# Patient Record
Sex: Female | Born: 1945
Health system: Southern US, Community
[De-identification: ages and names within clinical notes are randomized; demographics above are authoritative.]

## PROBLEM LIST (undated history)

## (undated) DIAGNOSIS — R112 Nausea with vomiting, unspecified: Secondary | ICD-10-CM

## (undated) DIAGNOSIS — D219 Benign neoplasm of connective and other soft tissue, unspecified: Secondary | ICD-10-CM

## (undated) DIAGNOSIS — T4145XA Adverse effect of unspecified anesthetic, initial encounter: Secondary | ICD-10-CM

## (undated) DIAGNOSIS — I10 Essential (primary) hypertension: Secondary | ICD-10-CM

## (undated) DIAGNOSIS — H548 Legal blindness, as defined in USA: Secondary | ICD-10-CM

## (undated) DIAGNOSIS — Z800 Family history of malignant neoplasm of digestive organs: Secondary | ICD-10-CM

## (undated) DIAGNOSIS — N939 Abnormal uterine and vaginal bleeding, unspecified: Secondary | ICD-10-CM

## (undated) DIAGNOSIS — D689 Coagulation defect, unspecified: Secondary | ICD-10-CM

## (undated) DIAGNOSIS — C541 Malignant neoplasm of endometrium: Secondary | ICD-10-CM

## (undated) DIAGNOSIS — Z801 Family history of malignant neoplasm of trachea, bronchus and lung: Secondary | ICD-10-CM

## (undated) DIAGNOSIS — D649 Anemia, unspecified: Secondary | ICD-10-CM

## (undated) DIAGNOSIS — G479 Sleep disorder, unspecified: Secondary | ICD-10-CM

## (undated) DIAGNOSIS — T8859XA Other complications of anesthesia, initial encounter: Secondary | ICD-10-CM

## (undated) DIAGNOSIS — Z9889 Other specified postprocedural states: Secondary | ICD-10-CM

## (undated) DIAGNOSIS — H409 Unspecified glaucoma: Secondary | ICD-10-CM

## (undated) DIAGNOSIS — F419 Anxiety disorder, unspecified: Secondary | ICD-10-CM

## (undated) DIAGNOSIS — J189 Pneumonia, unspecified organism: Secondary | ICD-10-CM

## (undated) DIAGNOSIS — T7840XA Allergy, unspecified, initial encounter: Secondary | ICD-10-CM

## (undated) DIAGNOSIS — Z9289 Personal history of other medical treatment: Secondary | ICD-10-CM

## (undated) DIAGNOSIS — H269 Unspecified cataract: Secondary | ICD-10-CM

## (undated) HISTORY — DX: Unspecified cataract: H26.9

## (undated) HISTORY — PX: EYE SURGERY: SHX253

## (undated) HISTORY — DX: Family history of malignant neoplasm of digestive organs: Z80.0

## (undated) HISTORY — PX: DILATION AND CURETTAGE OF UTERUS: SHX78

## (undated) HISTORY — DX: Allergy, unspecified, initial encounter: T78.40XA

## (undated) HISTORY — DX: Family history of malignant neoplasm of trachea, bronchus and lung: Z80.1

## (undated) HISTORY — DX: Coagulation defect, unspecified: D68.9

---

## 2012-02-18 DIAGNOSIS — H2510 Age-related nuclear cataract, unspecified eye: Secondary | ICD-10-CM | POA: Insufficient documentation

## 2012-02-18 DIAGNOSIS — H401190 Primary open-angle glaucoma, unspecified eye, stage unspecified: Secondary | ICD-10-CM | POA: Insufficient documentation

## 2012-02-18 DIAGNOSIS — H409 Unspecified glaucoma: Secondary | ICD-10-CM | POA: Insufficient documentation

## 2012-04-04 ENCOUNTER — Telehealth (HOSPITAL_COMMUNITY): Payer: Self-pay | Admitting: *Deleted

## 2012-04-04 DIAGNOSIS — D500 Iron deficiency anemia secondary to blood loss (chronic): Secondary | ICD-10-CM | POA: Insufficient documentation

## 2012-04-04 NOTE — ED Notes (Signed)
[facility] said pt. is there for pre-op for surgery and pt. has high BP and heart rate.  Pt. does not have a PCP and  she needs pt. to come here for follow-up tomorrow. Pt.'s surgery (? trabeculectomy) is cancelled. I explained that we do not do primary care here. I said pt. has not been here before. I suggested she go to [facility] because they can do primary care there. I gave her the phone number and address. Vassie Moselle 04/04/2012

## 2013-04-11 ENCOUNTER — Inpatient Hospital Stay (HOSPITAL_COMMUNITY)
Admission: EM | Admit: 2013-04-11 | Discharge: 2013-04-13 | DRG: 812 | Disposition: A | Discharge status: Home / Self Care | Payer: Medicaid Other | Attending: Internal Medicine | Admitting: Internal Medicine

## 2013-04-11 ENCOUNTER — Inpatient Hospital Stay (HOSPITAL_COMMUNITY): Payer: Medicaid Other

## 2013-04-11 ENCOUNTER — Emergency Department (HOSPITAL_COMMUNITY): Payer: Medicaid Other

## 2013-04-11 ENCOUNTER — Encounter (HOSPITAL_COMMUNITY): Payer: Self-pay | Admitting: Emergency Medicine

## 2013-04-11 DIAGNOSIS — D500 Iron deficiency anemia secondary to blood loss (chronic): Principal | ICD-10-CM | POA: Diagnosis present

## 2013-04-11 DIAGNOSIS — F411 Generalized anxiety disorder: Secondary | ICD-10-CM | POA: Diagnosis present

## 2013-04-11 DIAGNOSIS — R0602 Shortness of breath: Secondary | ICD-10-CM

## 2013-04-11 DIAGNOSIS — H409 Unspecified glaucoma: Secondary | ICD-10-CM | POA: Diagnosis present

## 2013-04-11 DIAGNOSIS — N949 Unspecified condition associated with female genital organs and menstrual cycle: Secondary | ICD-10-CM | POA: Diagnosis present

## 2013-04-11 DIAGNOSIS — D259 Leiomyoma of uterus, unspecified: Secondary | ICD-10-CM | POA: Diagnosis present

## 2013-04-11 DIAGNOSIS — E876 Hypokalemia: Secondary | ICD-10-CM | POA: Diagnosis present

## 2013-04-11 DIAGNOSIS — N920 Excessive and frequent menstruation with regular cycle: Secondary | ICD-10-CM | POA: Diagnosis present

## 2013-04-11 DIAGNOSIS — D509 Iron deficiency anemia, unspecified: Secondary | ICD-10-CM | POA: Diagnosis present

## 2013-04-11 DIAGNOSIS — T502X5A Adverse effect of carbonic-anhydrase inhibitors, benzothiadiazides and other diuretics, initial encounter: Secondary | ICD-10-CM | POA: Diagnosis present

## 2013-04-11 DIAGNOSIS — N938 Other specified abnormal uterine and vaginal bleeding: Secondary | ICD-10-CM | POA: Diagnosis present

## 2013-04-11 DIAGNOSIS — H547 Unspecified visual loss: Secondary | ICD-10-CM | POA: Diagnosis present

## 2013-04-11 DIAGNOSIS — E611 Iron deficiency: Secondary | ICD-10-CM | POA: Diagnosis present

## 2013-04-11 HISTORY — DX: Pneumonia, unspecified organism: J18.9

## 2013-04-11 HISTORY — DX: Unspecified glaucoma: H40.9

## 2013-04-11 HISTORY — DX: Anxiety disorder, unspecified: F41.9

## 2013-04-11 LAB — CBC WITH DIFFERENTIAL/PLATELET
Basophils Absolute: 0 10*3/uL (ref 0.0–0.1)
Basophils Relative: 0 % (ref 0–1)
HCT: 13.5 % — ABNORMAL LOW (ref 36.0–46.0)
Hemoglobin: 3.4 g/dL — CL (ref 12.0–15.0)
Lymphocytes Relative: 15 % (ref 12–46)
Monocytes Relative: 6 % (ref 3–12)
Neutro Abs: 8.3 10*3/uL — ABNORMAL HIGH (ref 1.7–7.7)
WBC: 10.6 10*3/uL — ABNORMAL HIGH (ref 4.0–10.5)

## 2013-04-11 LAB — PRO B NATRIURETIC PEPTIDE: Pro B Natriuretic peptide (BNP): 2474 pg/mL — ABNORMAL HIGH (ref 0–125)

## 2013-04-11 LAB — CBC
HCT: 33.2 % — ABNORMAL LOW (ref 36.0–46.0)
HCT: 30.7 % — ABNORMAL LOW (ref 36.0–46.0)
Hemoglobin: 9.9 g/dL — ABNORMAL LOW (ref 12.0–15.0)
MCH: 23.9 pg — ABNORMAL LOW (ref 26.0–34.0)
MCH: 23.9 pg — ABNORMAL LOW (ref 26.0–34.0)
MCHC: 32.2 g/dL (ref 30.0–36.0)
MCV: 75.6 fL — ABNORMAL LOW (ref 78.0–100.0)
Platelets: 313 10*3/uL (ref 150–400)
RBC: 4.39 MIL/uL (ref 3.87–5.11)
RBC: 4.14 MIL/uL (ref 3.87–5.11)
WBC: 13.6 10*3/uL — ABNORMAL HIGH (ref 4.0–10.5)

## 2013-04-11 LAB — COMPREHENSIVE METABOLIC PANEL
Albumin: 3.3 g/dL — ABNORMAL LOW (ref 3.5–5.2)
Alkaline Phosphatase: 75 U/L (ref 39–117)
BUN: 12 mg/dL (ref 6–23)
Chloride: 105 mEq/L (ref 96–112)
Glucose, Bld: 157 mg/dL — ABNORMAL HIGH (ref 70–99)
Potassium: 3.4 mEq/L — ABNORMAL LOW (ref 3.5–5.1)
Total Bilirubin: 0.3 mg/dL (ref 0.3–1.2)

## 2013-04-11 LAB — POCT I-STAT, CHEM 8
BUN: 12 mg/dL (ref 6–23)
Calcium, Ion: 1.07 mmol/L — ABNORMAL LOW (ref 1.13–1.30)
Glucose, Bld: 161 mg/dL — ABNORMAL HIGH (ref 70–99)
TCO2: 20 mmol/L (ref 0–100)

## 2013-04-11 LAB — MRSA PCR SCREENING: MRSA by PCR: NEGATIVE

## 2013-04-11 LAB — RETICULOCYTES: Retic Ct Pct: 1.5 % (ref 0.4–3.1)

## 2013-04-11 LAB — PREPARE RBC (CROSSMATCH)

## 2013-04-11 MED ORDER — FUROSEMIDE 10 MG/ML IJ SOLN
40.0000 mg | Freq: Once | INTRAMUSCULAR | Status: AC
  Administered 2013-04-11: 40 mg via INTRAVENOUS
  Filled 2013-04-11: qty 4

## 2013-04-11 MED ORDER — ACETAMINOPHEN 325 MG PO TABS: 650.0000 mg | ORAL_TABLET | Freq: Four times a day (QID) | ORAL | Status: DC | PRN

## 2013-04-11 MED ORDER — ONDANSETRON HCL 4 MG/2ML IJ SOLN: 4.0000 mg | Freq: Four times a day (QID) | INTRAMUSCULAR | Status: DC | PRN

## 2013-04-11 MED ORDER — LEVOFLOXACIN IN D5W 500 MG/100ML IV SOLN
500.0000 mg | Freq: Once | INTRAVENOUS | Status: AC
  Administered 2013-04-11: 500 mg via INTRAVENOUS
  Filled 2013-04-11: qty 100

## 2013-04-11 MED ORDER — SODIUM CHLORIDE 0.9 % IV SOLN
INTRAVENOUS | Status: DC
  Administered 2013-04-11: 05:00:00 via INTRAVENOUS
  Administered 2013-04-12: 1000 mL via INTRAVENOUS

## 2013-04-11 MED ORDER — TIMOLOL MALEATE 0.5 % OP SOLN
1.0000 [drp] | Freq: Two times a day (BID) | OPHTHALMIC | Status: DC
  Administered 2013-04-11 – 2013-04-13 (×3): 1 [drp] via OPHTHALMIC
  Filled 2013-04-11: qty 5

## 2013-04-11 MED ORDER — ACETAMINOPHEN 650 MG RE SUPP: 650.0000 mg | Freq: Four times a day (QID) | RECTAL | Status: DC | PRN

## 2013-04-11 MED ORDER — SODIUM CHLORIDE 0.9 % IV BOLUS (SEPSIS): 1000.0000 mL | Freq: Once | INTRAVENOUS | Status: DC

## 2013-04-11 MED ORDER — ONDANSETRON HCL 4 MG PO TABS: 4.0000 mg | ORAL_TABLET | Freq: Four times a day (QID) | ORAL | Status: DC | PRN

## 2013-04-11 MED ORDER — OXYCODONE HCL 5 MG PO TABS: 5.0000 mg | ORAL_TABLET | ORAL | Status: DC | PRN

## 2013-04-11 MED ORDER — BRIMONIDINE TARTRATE-TIMOLOL 0.2-0.5 % OP SOLN
1.0000 [drp] | Freq: Two times a day (BID) | OPHTHALMIC | Status: DC
  Filled 2013-04-11 (×18): qty 0.1

## 2013-04-11 MED ORDER — TRAVOPROST (BAK FREE) 0.004 % OP SOLN
1.0000 [drp] | Freq: Every day | OPHTHALMIC | Status: DC
Start: 2013-04-11 — End: 2013-04-13
  Administered 2013-04-11 – 2013-04-12 (×2): 1 [drp] via OPHTHALMIC
  Filled 2013-04-11: qty 2.5

## 2013-04-11 MED ORDER — BRIMONIDINE TARTRATE 0.2 % OP SOLN
1.0000 [drp] | Freq: Two times a day (BID) | OPHTHALMIC | Status: DC
  Administered 2013-04-11 – 2013-04-13 (×3): 1 [drp] via OPHTHALMIC
  Filled 2013-04-11: qty 5

## 2013-04-11 MED ORDER — GUAIFENESIN-DM 100-10 MG/5ML PO SYRP
5.0000 mL | ORAL_SOLUTION | ORAL | Status: DC | PRN
  Filled 2013-04-11: qty 5

## 2013-04-11 NOTE — ED Notes (Signed)
md at bedside

## 2013-04-11 NOTE — ED Notes (Signed)
Pt taken to xray with transport.

## 2013-04-11 NOTE — H&P (Signed)
[facility] History and Physical  CLAUDE SWENDSEN JYN:[id number] DOB: 1945/11/23 DOA: 04/11/2013  Referring physician: Sunnie Nielsen, MD PCP: No PCP Per Patient   Chief Complaint: Shortness of breath  HPI: Stephanie [name] is a 67 y.o. female with past medical history of glaucoma, iron deficiency anemia came in to the hospital complaining about shortness of breath. Patient said she was in her usual state of health till about 7-8 days ago when she started to be dyspneic when she walks, then 3 days ago this started to worsen, she also has some cough, dizziness and she has to pace herself to catch her breath. Initial evaluation in the emergency department showed hemoglobin of 3.4. She has MCV of 62.8, FOBT was done by ED physician and it was negative. Please note that patient has history of iron deficiency anemia which was treated in [facility] with on transfusion after patient declined the workup.   Review of Systems:  Constitutional: SOB, easy fatigability. Eyes: negative for irritation, redness and visual disturbance Ears, nose, mouth, throat, and face: negative for earaches, epistaxis, nasal congestion and sore throat Respiratory: negative for cough, dyspnea on exertion, sputum and wheezing Cardiovascular: negative for chest pain, dyspnea, lower extremity edema, orthopnea, palpitations and syncope Gastrointestinal: negative for abdominal pain, constipation, diarrhea, melena, nausea and vomiting Genitourinary:negative for dysuria, frequency and hematuria Hematologic/lymphatic: negative for bleeding, easy bruising and lymphadenopathy Musculoskeletal:negative for arthralgias, muscle weakness and stiff joints Neurological: negative for coordination problems, gait problems, headaches and weakness Endocrine: negative for diabetic symptoms including polydipsia, polyuria and weight loss Allergic/Immunologic: negative for anaphylaxis, hay fever and urticaria  Past Medical  History  Diagnosis Date  . Glaucoma   . Anxiety   . PNA (pneumonia)    History reviewed. No pertinent past surgical history. Social History:  reports that she has never smoked. She does not have any smokeless tobacco history on file. She reports that she does not drink alcohol or use illicit drugs.   Allergies  Allergen Reactions  . Iron Anaphylaxis  . Penicillins Hives and Swelling    PT, STATED ALLERGIES TO ALL " CILLINS" Tongue swelling  . Diamox (Acetazolamide) Nausea And Vomiting    Severe nausea and vomitting    No family history on file.   Prior to Admission medications   Medication Sig Start Date End Date Taking? Authorizing Provider  brimonidine-timolol (COMBIGAN) 0.2-0.5 % ophthalmic solution Place 1 drop into both eyes every 12 (twelve) hours.   Yes Historical Provider, MD  Dextromethorphan Polistirex (DELSYM PO) Take 10 mLs by mouth 2 (two) times daily as needed (cough).   Yes Historical Provider, MD  Travoprost, BAK Free, (TRAVATAN) 0.004 % SOLN ophthalmic solution Place 1 drop into both eyes at bedtime.   Yes Historical Provider, MD   Physical Exam: Filed Vitals:   04/11/13 0645 04/11/13 0715 04/11/13 0722 04/11/13 0737  BP: 147/81 147/81  172/97  Pulse: 111 122 123 124  Temp:  98.7 F (37.1 C)  99.2 F (37.3 C)  TempSrc:  Oral  Oral  Resp: 23 16 16 17   SpO2: 100%      General appearance: alert, cooperative and no distress  Head: Normocephalic, without obvious abnormality, atraumatic  Eyes: conjunctivae/corneas clear. PERRL, EOM's intact. Fundi benign.  Nose: Nares normal. Septum midline. Mucosa normal. No drainage or sinus tenderness.  Throat: lips, mucosa, and tongue normal; teeth and gums normal  Neck: Supple, no masses, no cervical lymphadenopathy, no JVD appreciated, no meningeal signs Resp: clear to  auscultation bilaterally  Chest wall: no tenderness  Cardio: regular rate and rhythm, S1, S2 normal, no murmur, click, rub or gallop  GI: soft,  non-tender; bowel sounds normal; no masses, no organomegaly  Extremities: extremities normal, atraumatic, no cyanosis or edema  Skin: Skin color, texture, turgor normal. No rashes or lesions  Neurologic: Alert and oriented X 3, normal strength and tone. Normal symmetric reflexes. Normal coordination and gait  Labs on Admission:  Basic Metabolic Panel:  Recent Labs Lab 04/11/13 0355 04/11/13 0407  NA 139 142  K 3.4* 3.6  CL 105 110  CO2 21  --   GLUCOSE 157* 161*  BUN 12 12  CREATININE 0.78 0.80  CALCIUM 8.2*  --    Liver Function Tests:  Recent Labs Lab 04/11/13 0355  AST 12  ALT 11  ALKPHOS 75  BILITOT 0.3  PROT 6.9  ALBUMIN 3.3*   No results found for this basename: LIPASE, AMYLASE,  in the last 168 hours No results found for this basename: AMMONIA,  in the last 168 hours CBC:  Recent Labs Lab 04/11/13 0355 04/11/13 0407  WBC 10.6*  --   NEUTROABS 8.3*  --   HGB 3.4* 4.8*  HCT 13.5* 14.0*  MCV 62.8*  --   PLT 325  --    Cardiac Enzymes: No results found for this basename: CKTOTAL, CKMB, CKMBINDEX, TROPONINI,  in the last 168 hours  BNP (last 3 results)  Recent Labs  04/11/13 0355  PROBNP 2474.0*   CBG: No results found for this basename: GLUCAP,  in the last 168 hours  Radiological Exams on Admission: Dg Chest 2 View  04/11/2013   *RADIOLOGY REPORT*  Clinical Data: Shortness of breath.  Cough.  Weakness for several days.  CHEST - 2 VIEW  Comparison: None.  Findings: Minimal exclusion of the right costophrenic angle on the frontal radiograph. Midline trachea.  Mild cardiomegaly. Mediastinal contours otherwise within normal limits.  Small bilateral pleural effusions. No pneumothorax.  Mild pulmonary interstitial prominence. No lobar consolidation.  Patchy left base atelectasis.  IMPRESSION: Small bilateral pleural effusions.  Cardiomegaly with suspicion of mild pulmonary venous congestion.  Patchy left base atelectasis.   Original Report Authenticated  By: Jeronimo Greaves, M.D.    EKG: Independently reviewed.   Assessment/Plan Principal Problem:   Microcytic anemia Active Problems:   SOB (shortness of breath)   Iron deficiency   Glaucoma   Anemia -Microcytic anemia, likely secondary to chronic blood loss. -FOBT is negative in the ED, repeat x3. -Patient did have similar episode of anemia treated with transfusion in June of 2013 at [facility]. -On 04/04/2012 iron was 13 and ferritin was 2. -Transfuse 4 units of packed RBCs. Check CBC every 12 hours others no overt bleeding. -There was question about dysfunctional uterine bleeding, check vaginal ultrasound. -If FOBT is positive I will call GI.  Iron deficiency -Likely secondary to chronic blood loss. -Will start oral iron supplements on discharge, she did have anaphylactic reaction to IV iron at [facility].  Shortness of breath -Secondary to anemia  Glaucoma -Continue her current eyedrops.  Code Status: Full code Family Communication: Plan discussed with the patient Disposition Plan: Inpatient, stepdown, anticipate length of stay to be greater than 2 midnights.  Time spent: 70 minutes  [facility] A [facility] Pager [phone]  If 7PM-7AM, please contact night-coverage [email or web address] Password [facility] 04/11/2013, 8:13 AM

## 2013-04-11 NOTE — ED Notes (Signed)
PT. REPORTS EXERTIONAL DYSPNEA " WHEN GOING UP STAIRS" WITH PRODUCTIVE COUGH AND RUNNY NOSE FOR SEVERAL DAYS , DENIES FEVER OR CHILLS. SLIGHT NAUSEA .

## 2013-04-11 NOTE — ED Provider Notes (Signed)
History    CSN: [id number] Arrival date & time 04/11/13  0209  First MD Initiated Contact with Patient 04/11/13 [phone]     Chief Complaint  Patient presents with  . Shortness of Breath   (Consider location/radiation/quality/duration/timing/severity/associated sxs/prior Treatment) HPI HX per PT - worsening dyspnea esp with exertion and associated cough. LGFs and some nausea no vomiting or recorded temp. No PCP. No CP. No leg pain or swelling. Per family takes her about 5 minutes to catch her breath after walking up the stairs. No h/o same. Symptoms MOD to severe  Past Medical History  Diagnosis Date  . Glaucoma   . Anxiety   . PNA (pneumonia)    History reviewed. No pertinent past surgical history. No family history on file. History  Substance Use Topics  . Smoking status: Never Smoker   . Smokeless tobacco: Not on file  . Alcohol Use: No   OB History   Grav Para Term Preterm Abortions TAB SAB Ect Mult Living                 Review of Systems  Constitutional: Negative for diaphoresis and fatigue.  HENT: Negative for neck pain and neck stiffness.   Eyes: Negative for visual disturbance.  Respiratory: Positive for cough and shortness of breath.   Cardiovascular: Negative for chest pain and leg swelling.  Gastrointestinal: Negative for vomiting and abdominal pain.  Genitourinary: Negative for dysuria.  Musculoskeletal: Negative for back pain.  Skin: Negative for rash.  Neurological: Negative for headaches.  All other systems reviewed and are negative.    Allergies  Iron; Penicillins; and Diamox  Home Medications   Current Outpatient Rx  Name  Route  Sig  Dispense  Refill  . brimonidine-timolol (COMBIGAN) 0.2-0.5 % ophthalmic solution   Both Eyes   Place 1 drop into both eyes every 12 (twelve) hours.         . Dextromethorphan Polistirex (DELSYM PO)   Oral   Take 10 mLs by mouth 2 (two) times daily as needed (cough).         . Travoprost, BAK Free,  (TRAVATAN) 0.004 % SOLN ophthalmic solution   Both Eyes   Place 1 drop into both eyes at bedtime.          BP 165/91  Pulse 132  Temp(Src) 99.6 F (37.6 C) (Rectal)  Resp 17  SpO2 100% Physical Exam  Constitutional: She is oriented to person, place, and time. She appears well-developed and well-nourished.  HENT:  Head: Normocephalic and atraumatic.  Dry mm  Eyes: EOM are normal. Pupils are equal, round, and reactive to light. No scleral icterus.  Conj pale  Neck: Neck supple.  Cardiovascular: Regular rhythm and intact distal pulses.   tachycardic  Pulmonary/Chest: Effort normal and breath sounds normal. No respiratory distress. She exhibits no tenderness.  Abdominal: Soft. Bowel sounds are normal. She exhibits no distension. There is no tenderness.  Genitourinary:  Rectal exam: soft brown stool, nontender, no masses  Musculoskeletal: Normal range of motion. She exhibits no edema and no tenderness.  Neurological: She is alert and oriented to person, place, and time.  Skin: Skin is warm and dry.    ED Course  Procedures (including critical care time)  Results for orders placed during the hospital encounter of 04/11/13  MRSA PCR SCREENING      Result Value Range   MRSA by PCR NEGATIVE  NEGATIVE  CBC WITH DIFFERENTIAL      Result Value Range  WBC 10.6 (*) 4.0 - 10.5 K/uL   RBC 2.15 (*) 3.87 - 5.11 MIL/uL   Hemoglobin 3.4 (*) 12.0 - 15.0 g/dL   HCT 62.9 (*) 52.8 - 41.3 %   MCV 62.8 (*) 78.0 - 100.0 fL   MCH 15.8 (*) 26.0 - 34.0 pg   MCHC 25.2 (*) 30.0 - 36.0 g/dL   RDW 24.4 (*) 01.0 - 27.2 %   Platelets 325  150 - 400 K/uL   Neutrophils Relative % 78 (*) 43 - 77 %   Lymphocytes Relative 15  12 - 46 %   Monocytes Relative 6  3 - 12 %   Eosinophils Relative 1  0 - 5 %   Basophils Relative 0  0 - 1 %   Neutro Abs 8.3 (*) 1.7 - 7.7 K/uL   Lymphs Abs 1.6  0.7 - 4.0 K/uL   Monocytes Absolute 0.6  0.1 - 1.0 K/uL   Eosinophils Absolute 0.1  0.0 - 0.7 K/uL   Basophils  Absolute 0.0  0.0 - 0.1 K/uL   RBC Morphology TARGET CELLS    COMPREHENSIVE METABOLIC PANEL      Result Value Range   Sodium 139  135 - 145 mEq/L   Potassium 3.4 (*) 3.5 - 5.1 mEq/L   Chloride 105  96 - 112 mEq/L   CO2 21  19 - 32 mEq/L   Glucose, Bld 157 (*) 70 - 99 mg/dL   BUN 12  6 - 23 mg/dL   Creatinine, Ser 5.36  0.50 - 1.10 mg/dL   Calcium 8.2 (*) 8.4 - 10.5 mg/dL   Total Protein 6.9  6.0 - 8.3 g/dL   Albumin 3.3 (*) 3.5 - 5.2 g/dL   AST 12  0 - 37 U/L   ALT 11  0 - 35 U/L   Alkaline Phosphatase 75  39 - 117 U/L   Total Bilirubin 0.3  0.3 - 1.2 mg/dL   GFR calc non Af Amer 87 (*) >90 mL/min   GFR calc Af Amer >90  >90 mL/min  PRO B NATRIURETIC PEPTIDE      Result Value Range   Pro B Natriuretic peptide (BNP) 2474.0 (*) 0 - 125 pg/mL  RETICULOCYTES      Result Value Range   Retic Ct Pct 1.5  0.4 - 3.1 %   RBC. 4.18  3.87 - 5.11 MIL/uL   Retic Count, Manual 62.7  19.0 - 186.0 K/uL  CBC      Result Value Range   WBC 11.3 (*) 4.0 - 10.5 K/uL   RBC 4.14  3.87 - 5.11 MIL/uL   Hemoglobin 9.9 (*) 12.0 - 15.0 g/dL   HCT 64.4 (*) 03.4 - 74.2 %   MCV 74.2 (*) 78.0 - 100.0 fL   MCH 23.9 (*) 26.0 - 34.0 pg   MCHC 32.2  30.0 - 36.0 g/dL   RDW 59.5 (*) 63.8 - 75.6 %   Platelets 240  150 - 400 K/uL  CBC      Result Value Range   WBC 13.6 (*) 4.0 - 10.5 K/uL   RBC 4.39  3.87 - 5.11 MIL/uL   Hemoglobin 10.5 (*) 12.0 - 15.0 g/dL   HCT 43.3 (*) 29.5 - 18.8 %   MCV 75.6 (*) 78.0 - 100.0 fL   MCH 23.9 (*) 26.0 - 34.0 pg   MCHC 31.6  30.0 - 36.0 g/dL   RDW 41.6 (*) 60.6 - 30.1 %   Platelets 313  150 -  400 K/uL  POCT I-STAT, CHEM 8      Result Value Range   Sodium 142  135 - 145 mEq/L   Potassium 3.6  3.5 - 5.1 mEq/L   Chloride 110  96 - 112 mEq/L   BUN 12  6 - 23 mg/dL   Creatinine, Ser 1.61  0.50 - 1.10 mg/dL   Glucose, Bld 096 (*) 70 - 99 mg/dL   Calcium, Ion 0.45 (*) 1.13 - 1.30 mmol/L   TCO2 20  0 - 100 mmol/L   Hemoglobin 4.8 (*) 12.0 - 15.0 g/dL   HCT 40.9 (*) 81.1  - 46.0 %   Comment NOTIFIED PHYSICIAN    POCT I-STAT TROPONIN I      Result Value Range   Troponin i, poc 0.01  0.00 - 0.08 ng/mL   Comment 3           OCCULT BLOOD, POC DEVICE      Result Value Range   Fecal Occult Bld NEGATIVE  NEGATIVE  TYPE AND SCREEN      Result Value Range   ABO/RH(D) O POS     Antibody Screen NEG     Sample Expiration 04/14/2013     Unit Number [id number]     Blood Component Type RBC LR PHER1     Unit division 00     Status of Unit ISSUED     Transfusion Status OK TO TRANSFUSE     Crossmatch Result Compatible     Unit Number [id number]     Blood Component Type RED CELLS,LR     Unit division 00     Status of Unit ISSUED     Transfusion Status OK TO TRANSFUSE     Crossmatch Result Compatible     Unit Number [id number]     Blood Component Type RED CELLS,LR     Unit division 00     Status of Unit ISSUED     Transfusion Status OK TO TRANSFUSE     Crossmatch Result Compatible     Unit Number [id number]     Blood Component Type RED CELLS,LR     Unit division 00     Status of Unit ISSUED     Transfusion Status OK TO TRANSFUSE     Crossmatch Result Compatible    PREPARE RBC (CROSSMATCH)      Result Value Range   Order Confirmation ORDER PROCESSED BY BLOOD BANK    ABO/RH      Result Value Range   ABO/RH(D) O POS    PREPARE RBC (CROSSMATCH)      Result Value Range   Order Confirmation ORDER PROCESSED BY BLOOD BANK     Dg Chest 2 View  04/11/2013   *RADIOLOGY REPORT*  Clinical Data: Shortness of breath.  Cough.  Weakness for several days.  Cinterstitial prominence. No lobar consolidation.  Patchy left base atelectasis.  IMPRESSION: Small bilateral pleural effusions.  Cardiomegaly with suspicion of mild pulmonary venous congestion.  Patchy left base atelectasis.   Original Report Authenticated By: Jeronimo Greaves, M.D.    Date: 04/11/2013  Rate: 128  Rhythm: sinus tachycardia  QRS Axis: normal  Intervals: normal  ST/T Wave abnormalities:  nonspecific ST/T changes  Conduction Disutrbances:none  Narrative Interpretation:   Old EKG Reviewed: none available  D/w Dr Lovell Sheehan - will admit   MDM  Dyspnea/ SOB found to be anemic, guaiac neg  Labs, CXR, ECG  Transfusion initiated for severe anemia  MED admit   Sunnie Nielsen,  MD 04/12/13 1610

## 2013-04-11 NOTE — ED Notes (Signed)
Pt back from xray with transport

## 2013-04-11 NOTE — ED Notes (Signed)
Report given to floor nurse and pt transported to the floor with nurse and monitor. Pt alert and in NAD at time of admission

## 2013-04-11 NOTE — ED Notes (Signed)
Pt reports productive cough, generalized weakness for past several days. Reports phlegm is yellow-whitish in color. States she took Delysm yesterday and it helped "some". Pt states that she has anxiety and her heart rate becomes very rapid with anxiety.

## 2013-04-12 LAB — TYPE AND SCREEN
Antibody Screen: NEGATIVE
Unit division: 0
Unit division: 0

## 2013-04-12 LAB — BASIC METABOLIC PANEL
CO2: 23 mEq/L (ref 19–32)
Calcium: 8.1 mg/dL — ABNORMAL LOW (ref 8.4–10.5)
Glucose, Bld: 120 mg/dL — ABNORMAL HIGH (ref 70–99)
Sodium: 139 mEq/L (ref 135–145)

## 2013-04-12 LAB — CBC
Hemoglobin: 10 g/dL — ABNORMAL LOW (ref 12.0–15.0)
MCH: 23.5 pg — ABNORMAL LOW (ref 26.0–34.0)
MCV: 74.8 fL — ABNORMAL LOW (ref 78.0–100.0)
Platelets: 226 10*3/uL (ref 150–400)
RBC: 4.25 MIL/uL (ref 3.87–5.11)

## 2013-04-12 LAB — TSH: TSH: 1.756 u[IU]/mL (ref 0.350–4.500)

## 2013-04-12 LAB — IRON AND TIBC
Iron: 65 ug/dL (ref 42–135)
Saturation Ratios: 15 % — ABNORMAL LOW (ref 20–55)
TIBC: 422 ug/dL (ref 250–470)

## 2013-04-12 LAB — FERRITIN: Ferritin: 7 ng/mL — ABNORMAL LOW (ref 10–291)

## 2013-04-12 MED ORDER — POTASSIUM CHLORIDE CRYS ER 20 MEQ PO TBCR
40.0000 meq | EXTENDED_RELEASE_TABLET | Freq: Two times a day (BID) | ORAL | Status: AC
  Administered 2013-04-12 – 2013-04-13 (×3): 40 meq via ORAL
  Filled 2013-04-12 (×3): qty 2

## 2013-04-12 MED ORDER — POLYSACCHARIDE IRON COMPLEX 150 MG PO CAPS
150.0000 mg | ORAL_CAPSULE | Freq: Every day | ORAL | Status: DC
  Administered 2013-04-12 – 2013-04-13 (×2): 150 mg via ORAL
  Filled 2013-04-12 (×2): qty 1

## 2013-04-12 MED ORDER — ZOLPIDEM TARTRATE 5 MG PO TABS: 5.0000 mg | ORAL_TABLET | Freq: Every evening | ORAL | Status: DC | PRN

## 2013-04-12 NOTE — Progress Notes (Signed)
[facility] Progress Note [place] TEAM 1 - Stepdown/ICU TEAM   AKIVA JOSEY ZHY:[id number] DOB: 15-Jan-1946 DOA: 04/11/2013 PCP: No PCP Per Patient  Brief narrative: 67 y.o. female with past medical history of glaucoma, and iron deficiency anemia who came in to the hospital complaining of shortness of breath. Patient said she was in her usual state of health till about 7-8 days prior to her admit when she started to become dyspneic when she walked, then 3 days prior to her admit this started to worsen.  She also had some cough, and dizziness.   Initial evaluation in the emergency department showed hemoglobin of 3.4 with an MCV of 62.8. FOBT was negative. Of note, patient has history of iron deficiency anemia which was treated at [facility] with transfusion only after patient declined the workup.   Assessment/Plan:  Profound microcytic anemia / severe Fe deficiency due to chronic blood loss on 04/04/2012 iron was 13 and ferritin was 2 (at [facility]) - notes per [facility] suggest this is due to menorrhagia, but pt refused GYN w/u at that time - anaphylaxis after given iron dextran test dose at [facility] June 2014 - has responded well to blood transfusion - denies active bleeding at this time   Menorrhagia Transabdominal ultrasound has revealed numerous fibroids and also a thickening of the endometrium - I have discussed this finding with the patient and informed her that the thickened endometrium could be an indication of endometrial cancer - she reports that she has not seen a gynecologist in years ago she has no insurance coverage and cannot afford this - we will attempt to set her up with outpatient GYN followup in short course and she may simply require hysterectomy  Glaucoma Has led to significant visual impairment  Code Status: FULL Family Communication: No family present at time of examination Disposition Plan:  SDU  Consultants: none  Procedures: none  Antibiotics: none  DVT prophylaxis: SCDs  HPI/Subjective: The patient is anxious to go home.  She denies any complaints whatsoever right now but she is quite tachycardic.  She admits to passing 3 orange sized blood clots approximately one week ago followed by very heavy dysfunctional uterine bleeding.  This is an intermittent recurrence for her.  She denies active bleeding at the present time.  She denies chest pain or shortness of breath.  She denies melena or hematochezia.  Objective: Blood pressure 142/86, pulse 112, temperature 99.8 F (37.7 C), temperature source Oral, resp. rate 21, height 5\' 6"  (1.676 m), weight 61 kg (134 lb 7.7 oz), SpO2 99.00%.  Intake/Output Summary (Last 24 hours) at 04/12/13 1221 Last data filed at 04/11/13 1800  Gross per 24 hour  Intake 813.75 ml  Output      0 ml  Net 813.75 ml    Exam: General: No acute respiratory distress Lungs: Clear to auscultation bilaterally without wheezes or crackles Cardiovascular: Tachycardic but regular without appreciable gallop rub or murmur Abdomen: Nontender, nondistended, soft, bowel sounds positive, no rebound, no ascites, no appreciable mass Extremities: No significant cyanosis, clubbing, or edema bilateral lower extremities  Data Reviewed: Basic Metabolic Panel:  Recent Labs Lab 04/11/13 0355 04/11/13 0407 04/12/13 0640  NA 139 142 139  K 3.4* 3.6 2.9*  CL 105 110 104  CO2 21  --  23  GLUCOSE 157* 161* 120*  BUN 12 12 8   CREATININE 0.78 0.80 0.68  CALCIUM 8.2*  --  8.1*   Liver Function Tests:  Recent Labs Lab  04/11/13 0355  AST 12  ALT 11  ALKPHOS 75  BILITOT 0.3  PROT 6.9  ALBUMIN 3.3*   CBC:  Recent Labs Lab 04/11/13 0355 04/11/13 0407 04/11/13 1944 04/11/13 2245 04/12/13 0640  WBC 10.6*  --  13.6* 11.3* 10.8*  NEUTROABS 8.3*  --   --   --   --   HGB 3.4* 4.8* 10.5* 9.9* 10.0*  HCT 13.5* 14.0* 33.2* 30.7* 31.8*  MCV 62.8*  --   75.6* 74.2* 74.8*  PLT 325  --  313 240 226   BNP (last 3 results)  Recent Labs  04/11/13 0355  PROBNP 2474.0*    Recent Results (from the past 240 hour(s))  MRSA PCR SCREENING     Status: None   Collection Time    04/11/13  7:39 AM      Result Value Range Status   MRSA by PCR NEGATIVE  NEGATIVE Final   Comment:            The GeneXpert MRSA Assay (FDA     approved for NASAL specimens     only), is one component of a     comprehensive MRSA colonization     surveillance program. It is not     intended to diagnose MRSA     infection nor to guide or     monitor treatment for     MRSA infections.     Studies:  Recent x-ray studies have been reviewed in detail by the Attending Physician  Scheduled Meds:  Scheduled Meds: . brimonidine  1 drop Both Eyes Q12H   And  . timolol  1 drop Both Eyes BID  . Travoprost (BAK Free)  1 drop Both Eyes QHS    Time spent on care of this patient:   Lonia Blood  [facility] Office  [phone] Pager - Text Page per Loretha Stapler as per below:  On-Call/Text Page:      [email or web address]      password [email or web address]  If 7PM-7AM, please contact night-coverage [email or web address] Password [email or web address] 04/12/2013, 12:21 PM   LOS: 1 day

## 2013-04-13 LAB — CBC
HCT: 28.7 % — ABNORMAL LOW (ref 36.0–46.0)
MCHC: 31.4 g/dL (ref 30.0–36.0)
MCV: 74.7 fL — ABNORMAL LOW (ref 78.0–100.0)
RDW: 25 % — ABNORMAL HIGH (ref 11.5–15.5)

## 2013-04-13 LAB — BASIC METABOLIC PANEL
BUN: 7 mg/dL (ref 6–23)
Calcium: 8.1 mg/dL — ABNORMAL LOW (ref 8.4–10.5)
Creatinine, Ser: 0.78 mg/dL (ref 0.50–1.10)
GFR calc Af Amer: >90 mL/min (ref >90)
GFR calc non Af Amer: 87 mL/min — ABNORMAL LOW (ref >90)

## 2013-04-13 MED ORDER — IRON 325 (65 FE) MG PO TABS: 1.0000 | ORAL_TABLET | Freq: Two times a day (BID) | ORAL | Status: DC

## 2013-04-13 MED ORDER — DOCUSATE SODIUM 100 MG PO CAPS: 100.0000 mg | ORAL_CAPSULE | Freq: Two times a day (BID) | ORAL | Status: DC

## 2013-04-13 NOTE — Discharge Summary (Signed)
Physician Discharge Summary  Stephanie [name]:[id number] DOB: 14-Jun-1946 DOA: 04/11/2013  PCP: No PCP Per Patient  Admit date: 04/11/2013 Discharge date: 04/13/2013  Time spent: 30 minutes  Recommendations for Outpatient Follow-up:  1. Follow up with [facility] in 1-2 weeks for routine hospital visit specifically regarding anemia. She has been instructed to call them if she does not have appointment scheduled at time of discharge.  (see AVS) 2. She also needs OP Gynecology follow up ASAP for vaginal bleeding in setting of uterine fibroids and enlarged endometrial stripe. She has been instructed to call the [facility] [phone]) in July to arrange for first available appointment in August (see AVS)   Discharge Diagnoses:  Severe microcytic anemia due to chronic dysfunctional uterine bleeding Uterine fibroids and enlarged endometrial stripe SOB (shortness of breath) due to profound anemia - resolved  Iron deficiency anemia; known allergic reaction to IV Iron Hypokalemia- resolved Glaucoma  Discharge Condition: stable  Diet recommendation: Regular- iron rich  Filed Weights   04/11/13 1520 04/12/13 0500  Weight: 64.2 kg (141 lb 8.6 oz) 61 kg (134 lb 7.7 oz)    History of present illness:  67 y.o. female with past medical history of glaucoma and iron deficiency anemia who came in to the hospital complaining of shortness of breath. Patient said she was in her usual state of health untill about 7-8 days prior to her admit when she started to become dyspneic when she walked, then 3 days prior to her admit this started to worsen. She also had some dizziness.   Initial evaluation in the emergency department revealed a hemoglobin of 3.4 with an MCV of 62.8. FOBT was negative. Of note, patient has history of iron deficiency anemia which was treated at [facility] with transfusion.    Hospital Course:   Profound microcytic anemia /  severe Fe deficiency due to chronic blood loss  -04/04/2012 iron was 13 and ferritin was 2 (at [facility]) - notes per [facility] suggest this is due to menorrhagia -she experienced anaphylaxis after she was given iron dextran test dose at [facility] June 2014  -this admission  She has responded well to blood transfusion with last Hgb on date of discharge of 9.0 -No active bleeding during this admission  Menorrhagia  -Transabdominal ultrasound has revealed numerous fibroids and also a thickening of the endometrium  - Dr. Sharon Seller discussed this finding with the patient and informed her that the thickened endometrium could be an indication of endometrial cancer. She reported that she has not seen a gynecologist in years and that she has no insurance coverage and cannot afford this  - Social work/casemanagement has assisted in arranging outpatient Internal Medicine follow up as well as GYN follow up (see above).  -She will become eligible for Medicare May 15, 2013  Glaucoma  -Has led to significant visual impairment prior to admit - needs OP eye exam at some point  Hypokalemia -due to Lasix given with blood transfusions -repleted and on date of discharge K+ had normalized   Procedures:  None  Consultations:  None  Discharge Exam: Filed Vitals:   04/13/13 0400 04/13/13 0600 04/13/13 0800 04/13/13 1200  BP: 117/63 116/78 149/93 146/92  Pulse: 99 106 120 123  Temp: 98.7 F (37.1 C)  99 F (37.2 C) 98.7 F (37.1 C)  TempSrc: Oral  Oral Oral  Resp: 18 21 22 19   Height:      Weight:  SpO2: 97% 100% 100% 100%   General: No acute respiratory distress  Lungs: Clear to auscultation bilaterally without wheezes or crackles  Cardiovascular: regular without appreciable gallop rub or murmur  Abdomen: Nontender, nondistended, soft, bowel sounds positive, no rebound, no ascites, no appreciable mass  Extremities: No significant cyanosis, clubbing, or edema bilateral lower extremities  Discharge  Instructions      Discharge Orders   Future Orders Complete By Expires     Call MD for:  difficulty breathing, headache or visual disturbances  As directed     Call MD for:  extreme fatigue  As directed     Call MD for:  persistant dizziness or light-headedness  As directed     Call MD for:  temperature >100.4  As directed     Diet general  As directed     Scheduling Instructions:      Iron rich diet    Increase activity slowly  As directed         Medication List         COMBIGAN 0.2-0.5 % ophthalmic solution  Generic drug:  brimonidine-timolol  Place 1 drop into both eyes every 12 (twelve) hours.     DELSYM PO  Take 10 mLs by mouth 2 (two) times daily as needed (cough).     docusate sodium 100 MG capsule  Commonly known as:  COLACE  Take 1 capsule (100 mg total) by mouth 2 (two) times daily.     Iron 325 (65 FE) MG Tabs  Take 1 tablet by mouth 2 (two) times daily.     Travoprost (BAK Free) 0.004 % Soln ophthalmic solution  Commonly known as:  TRAVATAN  Place 1 drop into both eyes at bedtime.       Allergies  Allergen Reactions  . Iron Anaphylaxis  . Penicillins Hives and Swelling    PT, STATED ALLERGIES TO ALL " CILLINS" Tongue swelling  . Diamox (Acetazolamide) Nausea And Vomiting    Severe nausea and vomitting   Follow-up Information   Schedule an appointment as soon as possible for a visit with [facility]    . (to be seen in 1-2 weeks. Please call them if you did not receive an appointment at time of discharge)    Contact information:   [address] [place] [address]       Call [facility]. (in July to arrange for first available appointment in August 2014)    Contact information:   [address] [place] [address]       The results of significant diagnostics from this hospitalization (including imaging, microbiology, ancillary and laboratory) are listed below for reference.     Significant Diagnostic Studies: Dg Chest 2 View  04/11/2013   *RADIOLOGY REPORT*  Clinical Data: Shortness of breath.  Cough.  Weakness for several days.  CHEST - 2 VIEW  Comparison: None.  Findings: Minimal exclusion of the right costophrenic angle on the frontal radiograph. Midline trachea.  Mild cardiomegaly. Mediastinal contours otherwise within normal limits.  Small bilateral pleural effusions. No pneumothorax.  Mild pulmonary interstitial prominence. No lobar consolidation.  Patchy left base atelectasis.  IMPRESSION: Small bilateral pleural effusions.  Cardiomegaly with suspicion of mild pulmonary venous congestion.  Patchy left base atelectasis.   Original Report Authenticated By: Jeronimo Greaves, M.D.   US Pelvis Complete  04/11/2013   ``` *RADIOLOGY REPORT*  Clinical Data:  TRANSABDOMINAL ULTRASOUND OF PELVIS  Technique:  Transabdominal  ultrasound examination of the pelvis was performed including evaluation of the uterus, ovaries, adnexal regions, and pelvic cul-de-sac.  Comparison:  None.  Findings:  The patient declined to have the advantage normal portion of the examination.  Uterus:  The uterus is enlarged and measures  14.3 x 8.4 x 10.2 cm. It appears enlarged and lobular with a number fibroids ranging in size up to 4.6 x 4.2 cm in size.  Endometrium: The endometrium is heterogeneous and appears thickened up to 4.5 cm.  Right Ovary: None visualized  Left Ovary: Not visualized on  Other Findings:  No free fluid  IMPRESSION: Fibroid uterus. Thickening of the endometrium.  This diameter is abnormal in a post menopausal woman and merits further evaluation to exclude neoplasm.   Original Report Authenticated By: Sander Radon, M.D.    Microbiology: Recent Results (from the past 240 hour(s))  MRSA PCR SCREENING     Status: None   Collection Time    04/11/13  7:39 AM      Result Value Range Status   MRSA by PCR NEGATIVE  NEGATIVE Final   Comment:            The GeneXpert MRSA Assay (FDA      approved for NASAL specimens     only), is one component of a     comprehensive MRSA colonization     surveillance program. It is not     intended to diagnose MRSA     infection nor to guide or     monitor treatment for     MRSA infections.     Labs: Basic Metabolic Panel:  Recent Labs Lab 04/11/13 0355 04/11/13 0407 04/12/13 0640 04/13/13 1038  NA 139 142 139 141  K 3.4* 3.6 2.9* 3.5  CL 105 110 104 107  CO2 21  --  23 24  GLUCOSE 157* 161* 120* 155*  BUN 12 12 8 7   CREATININE 0.78 0.80 0.68 0.78  CALCIUM 8.2*  --  8.1* 8.1*   Liver Function Tests:  Recent Labs Lab 04/11/13 0355  AST 12  ALT 11  ALKPHOS 75  BILITOT 0.3  PROT 6.9  ALBUMIN 3.3*   CBC:  Recent Labs Lab 04/11/13 0355 04/11/13 0407 04/11/13 1944 04/11/13 2245 04/12/13 0640 04/13/13 0625  WBC 10.6*  --  13.6* 11.3* 10.8* 10.3  NEUTROABS 8.3*  --   --   --   --   --   HGB 3.4* 4.8* 10.5* 9.9* 10.0* 9.0*  HCT 13.5* 14.0* 33.2* 30.7* 31.8* 28.7*  MCV 62.8*  --  75.6* 74.2* 74.8* 74.7*  PLT 325  --  313 240 226 216    Signed:  Junious Silk, ANP  [facility] 04/13/2013, 6:10 PM  I have personally examined this patient and reviewed the entire database. I have reviewed the above note, made any necessary editorial changes, and agree with its content.  Lonia Blood, MD [facility]

## 2013-04-13 NOTE — Care Management Note (Signed)
    Page 1 of 1   04/13/2013     3:16:29 PM   CARE MANAGEMENT NOTE 04/13/2013  Patient:  Stephanie [name], Stephanie [name]   Account Number:  [id number]  Date Initiated:  04/13/2013  Documentation initiated by:  Alvira Philips Assessment:   67 yr-old female adm with dx of anemia; lives with son, assisted with all ADLs     In-house referral  Artist      DC Planning Services  CM consult  [facility]    Status of service:  Completed, signed off  Discharge Disposition:  HOME/SELF CARE  Per UR Regulation:  Reviewed for med. necessity/level of care/duration of stay  Comments:  04/13/13 1152 Kimala Horne RN MSN BSN CCM Per pt, she has no PCP or gynecologist due to no insurance. Agrees to f/u appt @ [facility] and [facility].  Communicated need for appt to both clinics - [facility] will call pt to schedule appt, pt will need to call [facility] in mid-July to schedule appt in August.  Contact information provided for both.

## 2013-04-29 ENCOUNTER — Inpatient Hospital Stay: Payer: Self-pay

## 2013-05-15 ENCOUNTER — Ambulatory Visit: Payer: Medicare Other | Attending: Family Medicine | Admitting: Internal Medicine

## 2013-05-15 ENCOUNTER — Encounter: Payer: Self-pay | Admitting: Family Medicine

## 2013-05-15 VITALS — BP 198/125 | HR 140 | Temp 100.1°F | Resp 18 | Ht 66.0 in | Wt 137.0 lb

## 2013-05-15 DIAGNOSIS — F411 Generalized anxiety disorder: Secondary | ICD-10-CM | POA: Insufficient documentation

## 2013-05-15 DIAGNOSIS — D509 Iron deficiency anemia, unspecified: Secondary | ICD-10-CM | POA: Insufficient documentation

## 2013-05-15 DIAGNOSIS — I10 Essential (primary) hypertension: Secondary | ICD-10-CM | POA: Insufficient documentation

## 2013-05-15 HISTORY — DX: Essential (primary) hypertension: I10

## 2013-05-15 MED ORDER — ALPRAZOLAM 0.5 MG PO TABS: 0.5000 mg | ORAL_TABLET | Freq: Every evening | ORAL | Status: DC | PRN

## 2013-05-15 MED ORDER — FUROSEMIDE 40 MG PO TABS
40.0000 mg | ORAL_TABLET | Freq: Once | ORAL | Status: AC
  Administered 2013-05-15: 40 mg via ORAL

## 2013-05-15 MED ORDER — HYDROCHLOROTHIAZIDE 25 MG PO TABS: 25.0000 mg | ORAL_TABLET | Freq: Every day | ORAL | Status: DC

## 2013-05-15 MED ORDER — DOCUSATE SODIUM 100 MG PO CAPS: 100.0000 mg | ORAL_CAPSULE | Freq: Two times a day (BID) | ORAL | Status: DC

## 2013-05-15 MED ORDER — LISINOPRIL 10 MG PO TABS: 10.0000 mg | ORAL_TABLET | Freq: Every day | ORAL | Status: DC

## 2013-05-15 MED ORDER — IRON 325 (65 FE) MG PO TABS: 1.0000 | ORAL_TABLET | Freq: Two times a day (BID) | ORAL | Status: DC

## 2013-05-15 NOTE — Progress Notes (Signed)
Patient ID: Stephanie [name], female   DOB: 03/17/1946, 67 y.o.   MRN: [id number]  CC:  Follow-up hospital admission  HPI: Stephanie [name] is a 12 years of pleasant African American woman with recent admission for severe iron deficiency anemia for which she had 2 units of blood transfused. Anemia was due to chronic blood loss from uterine fibroid. She also has history of glaucoma and hypertension but not on medication for the blood pressure because she claimed her blood pressure was normal previously. She has not been to the doctor for over 25 year because she claimed she has been healthy until recently when she started losing her sight due to walk and is now diagnosed as glaucoma for which she has ophthalmology appointment on 06/11/2013. She also has OB appointment from a uterine fibroid on 06/01/2013.  Today she has no new complaint, she can have shortness of breath has resolved, she no longer coughs, she told me her energy is back and she is able to do all her home shores without distress, she denies any chest pain. She although told me that she is extremely anxious especially since the diagnosis of glaucoma and the fact that she is losing her vision. She is also very anxious anytime she is in the doctor's office for the fear that she will have a new diagnosis. She said the last time she was in the hospital her blood pressure was high initially but got controlled without medication she was told she has no high blood pressure issue and was not medicated. She does not check her blood pressure at home. There is no neck or leg swelling, no diarrhea, no symptoms suggestive of thyroid disease. She claimed has son cooks for her and he uses a lot of salt.   Allergies  Allergen Reactions  . Iron Anaphylaxis  . Penicillins Hives and Swelling    PT, STATED ALLERGIES TO ALL " CILLINS" Tongue swelling  . Diamox (Acetazolamide) Nausea And Vomiting    Severe nausea and vomitting   Past Medical History  Diagnosis  Date  . Glaucoma   . Anxiety   . PNA (pneumonia)    Current Outpatient Prescriptions on File Prior to Visit  Medication Sig Dispense Refill  . brimonidine-timolol (COMBIGAN) 0.2-0.5 % ophthalmic solution Place 1 drop into both eyes every 12 (twelve) hours.      . Travoprost, BAK Free, (TRAVATAN) 0.004 % SOLN ophthalmic solution Place 1 drop into both eyes at bedtime.      Marland Kitchen Dextromethorphan Polistirex (DELSYM PO) Take 10 mLs by mouth 2 (two) times daily as needed (cough).       No current facility-administered medications on file prior to visit.   Family History  Problem Relation Age of Onset  . Hypertension Father   . Vision loss Maternal Grandmother    History   Social History  . Marital Status: Divorced    Spouse Name: N/A    Number of Children: N/A  . Years of Education: N/A   Occupational History  . Not on file.   Social History Main Topics  . Smoking status: Never Smoker   . Smokeless tobacco: Not on file  . Alcohol Use: No  . Drug Use: No  . Sexually Active: Not on file   Other Topics Concern  . Not on file   Social History Narrative  . No narrative on file    Review of Systems: Constitutional: Negative for fever, chills, diaphoresis, activity change, appetite change and fatigue. HENT: Negative  for ear pain, nosebleeds, congestion, facial swelling, rhinorrhea, neck pain, neck stiffness and ear discharge.  Eyes: Negative for pain, discharge, redness, itching and visual disturbance. Respiratory: Negative for cough, choking, chest tightness, shortness of breath, wheezing and stridor.  Cardiovascular: Negative for chest pain, palpitations and leg swelling. Gastrointestinal: Negative for abdominal distention. Genitourinary: Negative for dysuria, urgency, frequency, hematuria, flank pain, decreased urine volume, difficulty urinating and dyspareunia.  Musculoskeletal: Negative for back pain, joint swelling, arthralgias and gait problem. Neurological: Negative for  dizziness, tremors, seizures, syncope, facial asymmetry, speech difficulty, weakness, light-headedness, numbness and headaches.  Hematological: Negative for adenopathy. Does not bruise/bleed easily. Psychiatric/Behavioral: Negative for hallucinations, behavioral problems, confusion, dysphoric mood, decreased concentration and agitation.    Objective:   Filed Vitals:   05/15/13 1629  BP: 198/125  Pulse: 140  Temp:   Resp:    Repeat BP 160/110, P 132  Physical Exam: Constitutional: Patient appears well-developed and well-nourished. No distress. Comfortable at rest HENT: Normocephalic, atraumatic, External right and left ear normal. Oropharynx is clear and moist.  Eyes: Conjunctivae and EOM are normal. PERRLA, no scleral icterus.Legally blind  Neck: Normal ROM. Neck supple. No JVD. No tracheal deviation. No thyromegaly. CVS: Tachycardia, RRR, S1/S2 +, no murmurs, no gallops, no carotid bruit.  Pulmonary: Effort and breath sounds normal, no stridor, rhonchi, wheezes, rales.  Abdominal: Soft. BS +,  no distension, tenderness, rebound or guarding.  Musculoskeletal: Normal range of motion. No edema and no tenderness.  Lymphadenopathy: No lymphadenopathy noted, cervical, inguinal or axillary Neuro: Alert. Normal reflexes, muscle tone coordination. No cranial nerve deficit. Skin: Skin is warm and dry. No rash noted. Not diaphoretic. No erythema. No pallor. Psychiatric: Normal mood and affect. Behavior, judgment, thought content normal.  Lab Results  Component Value Date   WBC 10.3 04/13/2013   HGB 9.0* 04/13/2013   HCT 28.7* 04/13/2013   MCV 74.7* 04/13/2013   PLT 216 04/13/2013   Lab Results  Component Value Date   CREATININE 0.78 04/13/2013   BUN 7 04/13/2013   NA 141 04/13/2013   K 3.5 04/13/2013   CL 107 04/13/2013   CO2 24 04/13/2013    No results found for this basename: HGBA1C   Lipid Panel  No results found for this basename: chol, trig, hdl, cholhdl, vldl, ldlcalc        Assessment and plan:   Patient Active Problem List   Diagnosis Date Noted  . Severe uncontrolled hypertension 05/15/2013  . Generalized anxiety disorder 05/15/2013  . Microcytic anemia 04/11/2013  . SOB (shortness of breath) 04/11/2013  . Iron deficiency 04/11/2013  . Glaucoma 04/11/2013   Plan:   Patient was requested to go to the ER she refused and was tearful   Patient was given 40 mg tablet of Lasix by mouth stat with a good effect on the blood pressure  Lab draw today for CBC D, metanephrine and TSH with free T4: To rule out thyroid hyperfunction and pheochromocytoma for anxiety and episodic blood pressure She was prescribed the following:  Xanax 0.5 mg by mouth Q. at bedtime when necessary  Hydrochlorothiazide 25 mg by mouth daily  Lisinopril 10 mg by mouth daily  Iron tablets and Colace were refilled  She was instructed to come back for blood pressure check and to book an appointment for one week for same  She is to have her blood pressure check from home (ambulatory) and recorded every day for the next one week and bring the log to the clinic for  follow-up  She is scheduled for a general physical in about a month from today  She is encouraged to keep her appointment with the gynecologist as well as the ophthalmologist as scheduled.        Jeanann Lewandowsky, MD [facility] [place], [place] [phone]   05/15/2013, 5:44 PM

## 2013-05-15 NOTE — Progress Notes (Signed)
Patient presents for hospital follow up from June 24th; states that she was told she has fibroids and has appointment with [facility] regarding this.  Patient states she is very Anxious today.

## 2013-05-20 ENCOUNTER — Telehealth: Payer: Self-pay | Admitting: Internal Medicine

## 2013-05-21 ENCOUNTER — Telehealth: Payer: Self-pay | Admitting: *Deleted

## 2013-05-21 NOTE — Telephone Encounter (Signed)
05/21/13 Spoke with patient and answered questions regarding blood pressure. Verbalize and understands. P.Forever Arechiga,RN BSN [facility]

## 2013-05-22 ENCOUNTER — Ambulatory Visit: Payer: Medicare Other | Attending: Family Medicine | Admitting: Internal Medicine

## 2013-05-22 VITALS — BP 172/96 | HR 89 | Temp 98.0°F | Resp 17 | Wt 137.2 lb

## 2013-05-22 DIAGNOSIS — H409 Unspecified glaucoma: Secondary | ICD-10-CM | POA: Insufficient documentation

## 2013-05-22 DIAGNOSIS — F411 Generalized anxiety disorder: Secondary | ICD-10-CM | POA: Insufficient documentation

## 2013-05-22 DIAGNOSIS — Z79899 Other long term (current) drug therapy: Secondary | ICD-10-CM | POA: Insufficient documentation

## 2013-05-22 DIAGNOSIS — I10 Essential (primary) hypertension: Secondary | ICD-10-CM | POA: Insufficient documentation

## 2013-05-22 DIAGNOSIS — Z09 Encounter for follow-up examination after completed treatment for conditions other than malignant neoplasm: Secondary | ICD-10-CM

## 2013-05-22 NOTE — Progress Notes (Signed)
Patient here for follow up on blood pressure Pressure in right arm 171/105 Repeat blood pressure in left arm 172/96

## 2013-05-22 NOTE — Progress Notes (Signed)
Patient ID: SHELTON SOLER, female   DOB: 1945/11/18, 67 y.o.   MRN: [id number] Patient Demographics  Adin Lariccia, is a 67 y.o. female  WUJ:[id number]  [id number]  DOB - 04-Jan-1946  Chief Complaint  Patient presents with  . Follow-up        Subjective:   Callen Zuba today is here for a follow up visit. She is here for blood pressure check as discussed in her last visit Patient has No headache, No chest pain, No abdominal pain - No Nausea, No new weakness tingling or numbness, No Cough - SOB.  She brought ambulatory blood pressure measurements, which ranges between 118-132 systolic over 75-90 diastolic. Patient is on the following medication as listed. She claims to be doing very well since the last visit.  Objective:    Filed Vitals:   05/22/13 1747  BP: 172/96  Pulse: 89  Temp: 98 F (36.7 C)  Resp: 17  Weight: 137 lb 3.2 oz (62.234 kg)  SpO2: 100%    Repeat blood pressure is 138/88 mm HG ALLERGIES:   Allergies  Allergen Reactions  . Iron Anaphylaxis  . Penicillins Hives and Swelling    PT, STATED ALLERGIES TO ALL " CILLINS" Tongue swelling  . Diamox (Acetazolamide) Nausea And Vomiting    Severe nausea and vomitting    PAST MEDICAL HISTORY: Past Medical History  Diagnosis Date  . Glaucoma   . Anxiety   . PNA (pneumonia)     MEDICATIONS AT HOME: Prior to Admission medications   Medication Sig Start Date End Date Taking? Authorizing Provider  ALPRAZolam Prudy Feeler) 0.5 MG tablet Take 1 tablet (0.5 mg total) by mouth at bedtime as needed for sleep or anxiety. 05/15/13   Jeanann Lewandowsky, MD  brimonidine-timolol (COMBIGAN) 0.2-0.5 % ophthalmic solution Place 1 drop into both eyes every 12 (twelve) hours.    Historical Provider, MD  Dextromethorphan Polistirex (DELSYM PO) Take 10 mLs by mouth 2 (two) times daily as needed (cough).    Historical Provider, MD  docusate sodium (COLACE) 100 MG capsule Take 1 capsule (100 mg total) by mouth 2 (two) times daily.  05/15/13   Jeanann Lewandowsky, MD  Ferrous Sulfate (IRON) 325 (65 FE) MG TABS Take 1 tablet by mouth 2 (two) times daily. 05/15/13   Jeanann Lewandowsky, MD  hydrochlorothiazide (HYDRODIURIL) 25 MG tablet Take 1 tablet (25 mg total) by mouth daily. 05/15/13   Jeanann Lewandowsky, MD  latanoprost (XALATAN) 0.005 % ophthalmic solution 1 drop. Place 1 drop into both eyes nightly.    Historical Provider, MD  lisinopril (PRINIVIL,ZESTRIL) 10 MG tablet Take 1 tablet (10 mg total) by mouth daily. 05/15/13   Jeanann Lewandowsky, MD  Travoprost, BAK Free, (TRAVATAN) 0.004 % SOLN ophthalmic solution Place 1 drop into both eyes at bedtime.    Historical Provider, MD     Exam  General appearance :Awake, alert,oriented, NAD, Speech Clear.  HEENT: Atraumatic and Normocephalic, PERLA Neck: supple, no JVD. No cervical lymphadenopathy.  Chest: Clear to auscultation bilaterally, no wheezing, rales or rhonchi CVS: S1 S2 regular, no murmurs.  Abdomen: soft, NBS, NT, ND, no gaurding, rigidity or rebound. Extremities: no cyanosis or clubbing, B/L Lower Ext shows no edema Neurology: Awake alert, and oriented X 3, CN II-XII intact, Non focal Skin: No Rash or lesions   Assessment & Plan   Repeat blood pressure is 138/88 mm HG  Pt. encouraged to continue the present regimen of medications Counseled on compliance Reinforced diet and exercise    Recommendations: Follow-up  in 4 weeks     Jeanann Lewandowsky M.D. 05/22/2013, 6:32 PM

## 2013-06-01 ENCOUNTER — Ambulatory Visit (INDEPENDENT_AMBULATORY_CARE_PROVIDER_SITE_OTHER): Payer: Medicare Other | Admitting: Obstetrics & Gynecology

## 2013-06-01 ENCOUNTER — Encounter: Payer: Self-pay | Admitting: *Deleted

## 2013-06-01 ENCOUNTER — Other Ambulatory Visit (HOSPITAL_COMMUNITY)
Admission: RE | Admit: 2013-06-01 | Discharge: 2013-06-01 | Disposition: A | Discharge status: Home / Self Care | Payer: Medicare Other | Source: Ambulatory Visit | Attending: Obstetrics & Gynecology | Admitting: Obstetrics & Gynecology

## 2013-06-01 ENCOUNTER — Encounter: Payer: Self-pay | Admitting: Obstetrics & Gynecology

## 2013-06-01 VITALS — BP 150/90 | HR 86 | Ht 66.0 in | Wt 136.6 lb

## 2013-06-01 DIAGNOSIS — N950 Postmenopausal bleeding: Secondary | ICD-10-CM | POA: Insufficient documentation

## 2013-06-01 DIAGNOSIS — D259 Leiomyoma of uterus, unspecified: Secondary | ICD-10-CM

## 2013-06-01 DIAGNOSIS — R9389 Abnormal findings on diagnostic imaging of other specified body structures: Secondary | ICD-10-CM

## 2013-06-01 DIAGNOSIS — C549 Malignant neoplasm of corpus uteri, unspecified: Secondary | ICD-10-CM | POA: Insufficient documentation

## 2013-06-01 MED ORDER — MEDROXYPROGESTERONE ACETATE 10 MG PO TABS: 20.0000 mg | ORAL_TABLET | Freq: Every day | ORAL | Status: DC

## 2013-06-01 NOTE — Progress Notes (Signed)
Patient ID: Stephanie [name], female   DOB: Nov 21, 1945, 67 y.o.   MRN: [id number]  Chief Complaint  Patient presents with  . Post Menopausal Bleeding  . Fibroids    HPI Stephanie [name] is a 67 y.o. female.  No LMP recorded. Patient is postmenopausal. [id number] Postmenopausal bleeding for 12 years, never evaluated until hospitalized at [facility] 03/2013 with severe anemia, transfused. She was known to have fibroid but no Gyn f/u for many years. HPI  Past Medical History  Diagnosis Date  . Glaucoma   . Anxiety   . PNA (pneumonia)   . Cataract     History reviewed. No pertinent past surgical history.  Family History  Problem Relation Age of Onset  . Hypertension Father   . Vision loss Maternal Grandmother     Social History History  Substance Use Topics  . Smoking status: Never Smoker   . Smokeless tobacco: Never Used  . Alcohol Use: No    Allergies  Allergen Reactions  . Iron Anaphylaxis  . Penicillins Hives and Swelling    PT, STATED ALLERGIES TO ALL " CILLINS" Tongue swelling  . Diamox [Acetazolamide] Nausea And Vomiting    Severe nausea and vomitting    Current Outpatient Prescriptions  Medication Sig Dispense Refill  . ALPRAZolam (XANAX) 0.5 MG tablet Take 1 tablet (0.5 mg total) by mouth at bedtime as needed for sleep or anxiety.  20 tablet  0  . brimonidine-timolol (COMBIGAN) 0.2-0.5 % ophthalmic solution Place 1 drop into both eyes every 12 (twelve) hours.      . docusate sodium (COLACE) 100 MG capsule Take 1 capsule (100 mg total) by mouth 2 (two) times daily.  10 capsule  0  . Ferrous Sulfate (IRON) 325 (65 FE) MG TABS Take 1 tablet by mouth 2 (two) times daily.  30 each  2  . hydrochlorothiazide (HYDRODIURIL) 25 MG tablet Take 1 tablet (25 mg total) by mouth daily.  30 tablet  0  . latanoprost (XALATAN) 0.005 % ophthalmic solution 1 drop. Place 1 drop into both eyes nightly.      Marland Kitchen lisinopril (PRINIVIL,ZESTRIL) 10 MG tablet Take 1 tablet (10 mg total) by mouth  daily.  30 tablet  0  . Travoprost, BAK Free, (TRAVATAN) 0.004 % SOLN ophthalmic solution Place 1 drop into both eyes at bedtime.      Marland Kitchen Dextromethorphan Polistirex (DELSYM PO) Take 10 mLs by mouth 2 (two) times daily as needed (cough).      . medroxyPROGESTERone (PROVERA) 10 MG tablet Take 2 tablets (20 mg total) by mouth daily.  30 tablet  2   No current facility-administered medications for this visit.    Review of Systems Review of Systems  Gastrointestinal: Positive for abdominal distention (mass). Negative for nausea.  Genitourinary: Positive for vaginal bleeding (clots and possible tissue) and menstrual problem. Negative for dysuria, vaginal discharge and pelvic pain.    Blood pressure 150/90, pulse 86, height 5\' 6"  (1.676 m), weight 136 lb 9.6 oz (61.961 kg).  Physical Exam Physical Exam  Constitutional: No distress.  thin  Pulmonary/Chest: Effort normal. No respiratory distress.  Abdominal: She exhibits mass (15 week size uterine). There is no tenderness.  Genitourinary:  modearate bleeding, uterus 16 weeks, cervix posterior.  Skin: Skin is warm and dry.  Psychiatric: She has a normal mood and affect. Her behavior is normal.    Data Reviewed CBC    Component Value Date/Time   WBC 10.3 04/13/2013 0625   RBC 3.84* 04/13/2013  0625   RBC 4.18 04/11/2013 2245   HGB 9.0* 04/13/2013 0625   HCT 28.7* 04/13/2013 0625   PLT 216 04/13/2013 0625   MCV 74.7* 04/13/2013 0625   MCH 23.4* 04/13/2013 0625   MCHC 31.4 04/13/2013 0625   RDW 25.0* 04/13/2013 0625   LYMPHSABS 1.6 04/11/2013 0355   MONOABS 0.6 04/11/2013 0355   EOSABS 0.1 04/11/2013 0355   BASOSABS 0.0 04/11/2013 0355   *RADIOLOGY REPORT*  Clinical Data:  TRANSABDOMINAL ULTRASOUND OF PELVIS  Technique: Transabdominal ultrasound examination of the pelvis was  performed including evaluation of the uterus, ovaries, adnexal  regions, and pelvic cul-de-sac.  Comparison: None.  Findings: The patient declined to have the  advantage normal  portion of the examination.  Uterus: The uterus is enlarged and measures 14.3 x 8.4 x 10.2 cm.  It appears enlarged and lobular with a number fibroids ranging in  size up to 4.6 x 4.2 cm in size.  Endometrium: The endometrium is heterogeneous and appears thickened  up to 4.5 cm.  Right Ovary: None visualized  Left Ovary: Not visualized on  Other Findings: No free fluid  IMPRESSION:  Fibroid uterus. Thickening of the endometrium. This diameter is  abnormal in a post menopausal woman and merits further evaluation  to exclude neoplasm.  Original Report Authenticated By: Sander Radon, M.D.    Assessment   postmenopausal bleeding, abnormal [place] with fibroids and thickened endometrium     Plan    RTC for Biopsy result Provera 20 mg daily        Stephanie [name] 06/01/2013, 4:00 PM

## 2013-06-11 ENCOUNTER — Telehealth: Payer: Self-pay | Admitting: Obstetrics and Gynecology

## 2013-06-11 NOTE — Telephone Encounter (Signed)
Called patient; no answer. Left message to call clinic back.

## 2013-06-11 NOTE — Telephone Encounter (Signed)
Patient called wanting advice on which pain medication she can take post endo bx.

## 2013-06-12 ENCOUNTER — Emergency Department (HOSPITAL_COMMUNITY)
Admission: EM | Admit: 2013-06-12 | Discharge: 2013-06-12 | Disposition: A | Discharge status: Home / Self Care | Payer: Medicare Other | Attending: Emergency Medicine | Admitting: Emergency Medicine

## 2013-06-12 ENCOUNTER — Encounter (HOSPITAL_COMMUNITY): Payer: Self-pay | Admitting: Emergency Medicine

## 2013-06-12 DIAGNOSIS — D259 Leiomyoma of uterus, unspecified: Secondary | ICD-10-CM | POA: Insufficient documentation

## 2013-06-12 DIAGNOSIS — Z880 Allergy status to penicillin: Secondary | ICD-10-CM | POA: Insufficient documentation

## 2013-06-12 DIAGNOSIS — R509 Fever, unspecified: Secondary | ICD-10-CM | POA: Insufficient documentation

## 2013-06-12 DIAGNOSIS — H269 Unspecified cataract: Secondary | ICD-10-CM | POA: Insufficient documentation

## 2013-06-12 DIAGNOSIS — N950 Postmenopausal bleeding: Secondary | ICD-10-CM

## 2013-06-12 DIAGNOSIS — F411 Generalized anxiety disorder: Secondary | ICD-10-CM | POA: Insufficient documentation

## 2013-06-12 DIAGNOSIS — Z79899 Other long term (current) drug therapy: Secondary | ICD-10-CM | POA: Insufficient documentation

## 2013-06-12 DIAGNOSIS — H409 Unspecified glaucoma: Secondary | ICD-10-CM | POA: Insufficient documentation

## 2013-06-12 DIAGNOSIS — Z8701 Personal history of pneumonia (recurrent): Secondary | ICD-10-CM | POA: Insufficient documentation

## 2013-06-12 DIAGNOSIS — R9389 Abnormal findings on diagnostic imaging of other specified body structures: Secondary | ICD-10-CM

## 2013-06-12 DIAGNOSIS — R109 Unspecified abdominal pain: Secondary | ICD-10-CM | POA: Insufficient documentation

## 2013-06-12 LAB — COMPREHENSIVE METABOLIC PANEL
Alkaline Phosphatase: 76 U/L (ref 39–117)
BUN: 14 mg/dL (ref 6–23)
CO2: 24 mEq/L (ref 19–32)
Chloride: 103 mEq/L (ref 96–112)
GFR calc Af Amer: 59 mL/min — ABNORMAL LOW (ref >90)
GFR calc non Af Amer: 51 mL/min — ABNORMAL LOW (ref >90)
Glucose, Bld: 142 mg/dL — ABNORMAL HIGH (ref 70–99)
Potassium: 3.7 mEq/L (ref 3.5–5.1)
Total Bilirubin: 0.1 mg/dL — ABNORMAL LOW (ref 0.3–1.2)

## 2013-06-12 LAB — CBC WITH DIFFERENTIAL/PLATELET
Eosinophils Absolute: 0.1 10*3/uL (ref 0.0–0.7)
Hemoglobin: 8.7 g/dL — ABNORMAL LOW (ref 12.0–15.0)
Lymphocytes Relative: 27 % (ref 12–46)
Lymphs Abs: 2.4 10*3/uL (ref 0.7–4.0)
MCH: 28.6 pg (ref 26.0–34.0)
Monocytes Relative: 7 % (ref 3–12)
Neutro Abs: 5.8 10*3/uL (ref 1.7–7.7)
Neutrophils Relative %: 64 % (ref 43–77)
RBC: 3.04 MIL/uL — ABNORMAL LOW (ref 3.87–5.11)

## 2013-06-12 MED ORDER — HYDROCODONE-ACETAMINOPHEN 5-325 MG PO TABS: 1.0000 | ORAL_TABLET | Freq: Four times a day (QID) | ORAL | Status: DC | PRN

## 2013-06-12 MED ORDER — HYDROCODONE-ACETAMINOPHEN 5-325 MG PO TABS
1.0000 | ORAL_TABLET | Freq: Once | ORAL | Status: AC
  Administered 2013-06-12: 1 via ORAL
  Filled 2013-06-12: qty 1

## 2013-06-12 MED ORDER — MEDROXYPROGESTERONE ACETATE 10 MG PO TABS: 10.0000 mg | ORAL_TABLET | Freq: Every day | ORAL | Status: DC

## 2013-06-12 MED ORDER — MEDROXYPROGESTERONE ACETATE 10 MG PO TABS
10.0000 mg | ORAL_TABLET | Freq: Every day | ORAL | Status: DC
  Administered 2013-06-12: 10 mg via ORAL
  Filled 2013-06-12: qty 1

## 2013-06-12 NOTE — ED Notes (Signed)
PT. REPORTS VAGINAL BLEEDING WITH CLOTS ONSET THIS EVENING AND GENERALIZED ABDOMINAL CRAMPING .

## 2013-06-12 NOTE — ED Provider Notes (Signed)
CSN: [id number]     Arrival date & time 06/12/13  0045 History   First MD Initiated Contact with Patient 06/12/13 0231     Chief Complaint  Patient presents with  . Vaginal Bleeding   (Consider location/radiation/quality/duration/timing/severity/associated sxs/prior Treatment) HPI Comments: Patient is being followed by OB/GYN for postmenopausal bleeding, fibroids.  She recently had an endometrial biopsy.  She has followup appointment on Wednesday.  She states, that she was taking Provera, which stopped the bleeding, but noticed today that she started bleeding again.  She is passing clots is gone 4-5 pads before coming to the emergency department.  This has since slowed down.  She does not feel weak, or short of breath  Patient is a 67 y.o. female presenting with vaginal bleeding. The history is provided by the patient.  Vaginal Bleeding Quality:  Passed tissue and clots Onset quality:  Sudden Timing:  Constant Progression:  Worsening Chronicity:  New Menstrual history: Vision has known fibroids.  She recently had an endometrial biopsy has an appointment next Wednesday for followup to develop.  A plan on treatment for her fibroids. Possible pregnancy: no   Relieved by:  None tried Associated symptoms: abdominal pain and fever   Associated symptoms: no vaginal discharge     Past Medical History  Diagnosis Date  . Glaucoma   . Anxiety   . PNA (pneumonia)   . Cataract    History reviewed. No pertinent past surgical history. Family History  Problem Relation Age of Onset  . Hypertension Father   . Vision loss Maternal Grandmother    History  Substance Use Topics  . Smoking status: Never Smoker   . Smokeless tobacco: Never Used  . Alcohol Use: No   OB History   Grav Para Term Preterm Abortions TAB SAB Ect Mult Living   2 1 1  0 1  1   1      Review of Systems  Constitutional: Positive for fever.  Respiratory: Negative for shortness of breath.   Cardiovascular: Negative for  chest pain.  Gastrointestinal: Positive for abdominal pain.  Genitourinary: Positive for vaginal bleeding. Negative for vaginal discharge.  Skin: Negative for pallor and rash.  All other systems reviewed and are negative.    Allergies  Iron; Penicillins; and Diamox  Home Medications   Current Outpatient Rx  Name  Route  Sig  Dispense  Refill  . ALPRAZolam (XANAX) 0.5 MG tablet   Oral   Take 1 tablet (0.5 mg total) by mouth at bedtime as needed for sleep or anxiety.   20 tablet   0   . bimatoprost (LUMIGAN) 0.03 % ophthalmic solution   Both Eyes   Place 1 drop into both eyes at bedtime.         . brimonidine-timolol (COMBIGAN) 0.2-0.5 % ophthalmic solution   Both Eyes   Place 1 drop into both eyes every 12 (twelve) hours.         . Dextromethorphan Polistirex (DELSYM PO)   Oral   Take 10 mLs by mouth 2 (two) times daily as needed (for cough).          . Ferrous Sulfate (IRON) 325 (65 FE) MG TABS   Oral   Take 1 tablet by mouth 2 (two) times daily.   30 each   2   . hydrochlorothiazide (HYDRODIURIL) 25 MG tablet   Oral   Take 1 tablet (25 mg total) by mouth daily.   30 tablet   0   . lisinopril (  PRINIVIL,ZESTRIL) 10 MG tablet   Oral   Take 1 tablet (10 mg total) by mouth daily.   30 tablet   0   . HYDROcodone-acetaminophen (NORCO/VICODIN) 5-325 MG per tablet   Oral   Take 1 tablet by mouth every 6 (six) hours as needed for pain.   12 tablet   0   . medroxyPROGESTERone (PROVERA) 10 MG tablet   Oral   Take 1 tablet (10 mg total) by mouth daily.   12 tablet   2    BP 183/93  Pulse 107  Temp(Src) 100 F (37.8 C) (Oral)  Resp 14  SpO2 99% Physical Exam  Nursing note and vitals reviewed. Constitutional: She is oriented to person, place, and time. She appears well-nourished.  HENT:  Head: Normocephalic.  Eyes: Pupils are equal, round, and reactive to light.  Neck: Normal range of motion.  Cardiovascular: Regular rhythm.  Tachycardia present.    Pulmonary/Chest: Effort normal. She has no wheezes.  Abdominal: Soft. She exhibits no distension. There is no tenderness.  Neurological: She is alert and oriented to person, place, and time.  Skin: Skin is warm and dry.    ED Course  Procedures (including critical care time) Labs Review Labs Reviewed  CBC WITH DIFFERENTIAL - Abnormal; Notable for the following:    RBC 3.04 (*)    Hemoglobin 8.7 (*)    HCT 25.9 (*)    RDW 15.9 (*)    All other components within normal limits  COMPREHENSIVE METABOLIC PANEL - Abnormal; Notable for the following:    Glucose, Bld 142 (*)    Creatinine, Ser 1.11 (*)    Total Bilirubin 0.1 (*)    GFR calc non Af Amer 51 (*)    GFR calc Af Amer 59 (*)    All other components within normal limits   Imaging Review No results found.  MDM   1. Fibroid uterus   2. Postmenopausal vaginal bleeding   3. Thickened endometrium     Patient appears to be at baseline, in the 1, hematocrit have started Provera Will give Rx for Vicodin for pain control as well   Arman Filter, NP 06/12/13 0350  Arman Filter, NP 06/12/13 [phone]

## 2013-06-12 NOTE — ED Provider Notes (Signed)
Medical screening examination/treatment/procedure(s) were performed by non-physician practitioner and as supervising physician I was immediately available for consultation/collaboration.  Kareem Cathey, MD 06/12/13 0527 

## 2013-06-12 NOTE — Telephone Encounter (Signed)
Called patient informing her that we are returning her phone call. Patient stated that she ended up going to the emergency room last night because her cramping got worse and her bleeding got heavy and she was confused about her iron level getting too low. Asked patient what they did for her in the ER and what did they say. Patient stated they just told her the same thing she already knew which was about the fibroids and they gave her a prescription for provera and vicodin. Told patient not to take the provera they gave her and to continue taking what Dr Debroah Loop prescribed and that he will follow up with her at her appt on Wednesday for further management and that its good she went to the ER just to make sure everything was okay. Patient stated she was feeling better this morning, verbalized understanding and had no further questions.

## 2013-06-17 ENCOUNTER — Telehealth: Payer: Self-pay | Admitting: *Deleted

## 2013-06-17 ENCOUNTER — Ambulatory Visit (INDEPENDENT_AMBULATORY_CARE_PROVIDER_SITE_OTHER): Payer: Medicare Other | Admitting: Obstetrics & Gynecology

## 2013-06-17 VITALS — BP 152/94 | HR 145 | Temp 99.3°F | Ht 66.0 in | Wt 140.5 lb

## 2013-06-17 DIAGNOSIS — C549 Malignant neoplasm of corpus uteri, unspecified: Secondary | ICD-10-CM

## 2013-06-17 DIAGNOSIS — C541 Malignant neoplasm of endometrium: Secondary | ICD-10-CM | POA: Insufficient documentation

## 2013-06-17 NOTE — Telephone Encounter (Signed)
Patient left before referral to GYN Oncology could be made. Pt is scheduled for 07/01/13 at 11 am. She will need to arrive at 1030 at the [facility]. Her appointment is with Dr. Duard Brady.

## 2013-06-17 NOTE — Progress Notes (Signed)
Patient ID: Stephanie [name], female   DOB: 06/19/1946, 67 y.o.   MRN: [id number] No LMP recorded. Patient is postmenopausal. [id number] No vaginal bleeding now but went to ED last weeks with pain and bleeding that has resolved.   Endometrial biopsy showed Grade 1 adenocarcinoma. She voiced understanding and the indication for referral to [facility] oncology for surgical management. Her questions were answered.  Adam Phenix, MD 06/17/2013

## 2013-06-17 NOTE — Patient Instructions (Signed)
Cancer of the Uterus The uterus is part of a woman's reproductive system. It is the hollow, pear-shaped organ where a baby grows. The uterus is in the pelvis between the bladder and the rectum. The narrow, lower portion of the uterus is the cervix. The fallopian tubes extend from either side of the top of the uterus to the ovaries. The wall of the uterus has two layers of tissue. The inner layer, or lining, is the endometrium. The outer layer is muscle tissue called the myometrium. In women of childbearing age, the lining of the uterus grows and thickens each month to prepare for pregnancy. If a woman does not become pregnant, the thick, bloody lining flows out of the body through the vagina. This flow is called menstruation. TYPES OF UTERINE CANCER  The most common type of cancer of the uterus begins in the lining (endometrium). It is called endometrial cancer, uterine cancer, or cancer of the uterus. It is seen in 2% to 3% of women.  A different type of cancer, uterine sarcoma, develops in the muscle (myometrium). Cancer that begins in the cervix is also a different type of cancer.  Rarely, a noncancerous fibroid tumor of the uterus develops into a sarcoma. CAUSES  No one knows the exact causes of uterine cancer. But it is clear that this disease is not contagious. No one can "catch" cancer from another person. Women who get this disease are more likely than other women to have certain risk factors. A risk factor is something that increases a person's chance of developing the disease.  Most women who have known risk factors do not get uterine cancer. On the other hand, many who do get this disease have none of these factors. Doctors can seldom explain why one woman gets uterine cancer and another does not.  Studies have found the following risk factors:  Age. Cancer of the uterus occurs mostly in women over age 50.  Endometrial hyperplasia (enlarged endometrium). The risk of uterine cancer is  higher if a woman has endometrial hyperplasia.  Hormone replacement therapy (HRT). HRT is used to control the symptoms of menopause, to prevent osteoporosis (thinning of the bones), and to reduce the risk of heart disease or stroke. Women who still have their uterus, and use estrogen without progesterone, have an increased risk of uterine cancer. Long-term use and large doses of estrogen seem to increase this risk. Women who use a combination of estrogen and progesterone have a lower risk of uterine cancer than women who use estrogen alone. The progesterone protects the uterus from developing cancer.  Obesity and related conditions. The body stores and releases some of its estrogen in fatty tissue. That is why obese women are more likely than thin women to have higher levels of estrogen in their bodies. High levels of estrogen may be the reason that obese women have an increased risk of developing uterine cancer. The risk of this disease is also higher in women with diabetes or high blood pressure. These conditions occur in many obese women.  Tamoxifen. Women taking the drug tamoxifen to prevent or treat breast cancer have an increased risk of uterine cancer. This risk appears to be related to the estrogen-like effect of this drug on the uterus.  Race. White women are more likely than African-American women to get uterine cancer.  Colorectal cancer. Women who have had an inherited form of colorectal cancer have a higher risk of developing uterine cancer than other women.  Infertility.  Beginning menstrual   periods before age 12.  Having menstrual periods after age 52.  History of cancer of the ovary or intestine.  Family history of uterine cancer.  Having diabetes, high blood pressure, thyroid or gallbladder disease.  Long-term use of high does of birth control pills. Birth control pills today are low in hormone doses.  Radiation to the abdomen or pelvis.  Smoking. SYMPTOMS  Uterine  cancer usually occurs after menopause. But it may also occur around the time that menopause begins. Abnormal vaginal bleeding is the most common symptom of uterine cancer. Bleeding may start as a watery, blood-streaked flow that gradually contains more blood. Women should not assume that abnormal vaginal bleeding is part of menopause. A woman should see her caregiver if she has any of the following symptoms:  Unusual vaginal bleeding or discharge.  Difficult or painful urination.  Pain during intercourse.  Pain in the pelvic area.  Increased girth (growth) of the stomach.  Any vaginal bleeding after menopause.  Unexplained weight loss. These symptoms can be caused by cancer or other less serious conditions. Most often they are not cancer. But a thorough evaluation is needed to be certain. DIAGNOSIS  If a woman has symptoms that suggest uterine cancer, her caregiver may check her general health and may order blood and urine tests. The caregiver also may perform one or more of these exams or tests.  Blood and urine tests and chest x-rays. The woman also may have:  Other X-rays.  CT scans.  Ultrasound test.  Magnetic resonance imaging (MRI).  Sigmoidoscopy.  Colonoscopy.  Pelvic exam. A woman will have a pelvic exam to check the vagina, uterus, bladder, and rectum. The caregiver feels these organs for any lumps or changes in their shape or size. To see the upper part of the vagina and the cervix, the caregiver inserts an instrument called a speculum into the vagina.  Pap test. The caregiver collects cells from the cervix and upper vagina. A medical laboratory checks for abnormal cells. The Pap test is better for detecting cancer of the cervix. But cells from inside the uterus usually do not show up on a Pap test. It is not a reliable test for uterine cancer.  Transvaginal ultrasound. The medical caregiver inserts an instrument into the vagina. The instrument aims high-frequency  sound waves at the uterus. The pattern of the echoes they produce creates a picture. If the endometrium looks too thick, the caregiver can do a biopsy.  Biopsy. The medical caregiver removes a sample of tissue from the uterine lining. This usually can be done in the caregiver's office.  Dilatation and Curettage (D&C). In some cases, a woman may need to have a D&C. D&C is usually done as same-day surgery with anesthesia in a hospital. A pathologist examines the tissue (lining of the uterus) to check for cancer cells and other conditions. STAGING   If uterine cancer is diagnosed, the caregiver needs to know the stage, or extent, of the disease to plan the best treatment. Staging is a careful attempt to find out whether the cancer has spread, and if so, to what parts of the body.  When uterine cancer spreads (metastasizes) outside the uterus, cancer cells are often found in nearby lymph nodes, nerves, or blood vessels. If the cancer has reached the lymph nodes, cancer cells may have spread to other lymph nodes and other organs of the body.  Staging is done at the time of surgery. In most cases, the most reliable way to stage   this disease is to remove the uterus, cervix, tubes, ovaries, and lymph nodes. A pathologist uses a microscope to examine the uterus and other tissues removed by the surgeon, to determine the extent of the cancer in the pelvis.  If lymph nodes have cancer cells, other parts of the body are examined, to see if it has spread to other organs. MAIN FEATURES OF EACH STAGE OF THE DISEASE: Stage I. The cancer is only in the body of the uterus. It is not in the cervix. Stage II. The cancer has spread from the body of the uterus to the cervix. Stage III. The cancer has spread outside the uterus, but not outside the pelvis (and not to the bladder or rectum). Lymph nodes in the pelvis may contain cancer cells. Stage IV. The cancer has spread into the bladder or rectum. It may have spread  beyond the pelvis to other body parts. TREATMENT  Women with uterine cancer have many treatment options. Most women with uterine cancer are treated with surgery. Some have radiation or chemotherapy. A smaller number of women may be treated with hormonal therapy. Some patients receive a combination of therapies. You may want to consult with another cancer doctor for a second opinion. The caregiver (usually a cancer doctor) is the best person to describe your treatment choices and to discuss the expected results of treatment. SURGERY  Most women with uterine cancer have surgery to remove the uterus, cervix, tubes, and ovaries (total hysterectomy). This is usually done through an incision in the abdomen.  The doctor may also remove the lymph nodes near the tumor, to see if they contain cancer. If cancer cells have reached the lymph nodes, it may mean that the disease has spread to other parts of the body. If cancer cells have not spread beyond the endometrium, the woman may not need to have any other treatment. The length of the hospital stay may vary from several days to a week. RADIATION THERAPY  In radiation therapy, high-energy rays are used to kill cancer cells. Like surgery, radiation therapy is a local therapy. It affects cancer cells only in the treated area.  Some women with Stage I, II, or III uterine cancer need both radiation therapy and surgery. They may have radiation before surgery to shrink the tumor, or after surgery to destroy any cancer cells that remain in the area. The doctor may suggest radiation treatments for the small number of women who cannot have surgery.  Doctors use two types of radiation therapy to treat uterine cancer:  External radiation. In external radiation therapy, a large machine outside the body is used to aim radiation at the tumor area. The woman usually does not stay overnight (outpatient) at the hospital or clinic, and receives external radiation 5 days a week  for several weeks. This schedule helps protect healthy cells and tissue by spreading out the total dose of radiation. No radioactive materials are put into the body for external radiation therapy.  Internal radiation. In internal radiation therapy, tiny tubes containing a radioactive substance are inserted through the vagina and cervix, into the uterus, and left in place for a few days. The woman stays in the hospital during this treatment. To protect others from radiation exposure, the patient may not be able to have visitors or may have visitors only for a short period of time while the implant is in place. Once the implant is removed, the woman has no radioactivity in her body.  Some patients need both   external and internal radiation therapies. CHEMOTHERAPY Chemotherapy is not usually used for endometrial cancer of the uterus. However, with sarcoma of the uterus or of the fibroid, it may be used in combination with surgery. Chemotherapy may also be used with recurring sarcoma, and in patients who cannot have surgery. HORMONE THERAPY Hormonal therapy involves substances that prevent cancer cells from multiplying or growing by attaching to hormone receptors. This causes changes in cancer cells. Before therapy begins, the caregiver may request a hormone receptor test. This special lab test of uterine tissue helps the caregiver learn if estrogen and progesterone receptors are present. If the tissue has receptors, the woman is more likely to respond to hormonal therapy.  Hormonal therapy is called a systemic therapy, because it can affect cancer cells throughout the body. Usually, hormonal therapy is a type of progesterone, taken as a pill or injection.  The doctor may use hormonal therapy for women with uterine cancer who are unable to have surgery or radiation therapy. Also, the doctor may give hormonal therapy to women with uterine cancer that has spread to the lungs or other distant sites. It is also  given to women with uterine cancer that has come back.  Hormonal therapy can cause a number of side effects. Women taking progesterone may retain fluid, have an increased appetite, and gain weight. Women who are still menstruating may have changes in their periods.  Hormone therapy can be used in combination with surgery or radiation. HOME CARE INSTRUCTIONS   Maintain a normal weight with a healthy balanced diet and exercise.  If you have diabetes, high blood pressure, thyroid or gallbladder disease, keep them in control with your caregiver's treatment and recommendations.  Do not smoke.  Do not take estrogen without taking progesterone with it, for menopausal symptoms.  Join a support group or get counseling, if you would like help dealing with your cancer.  If you are on hormone replacement therapy, see your caregiver as recommended, and be informed about the side effects of HRT.  Women with known risk factors should ask their caregiver what symptoms to look for and how often they should have an examination.  Keep your follow-up appointments and take your medicines as advised.  Write your questions down, and take them with you to your caregiver's appointments.  You may want another person to be with you for your appointments, so you do not miss any instructions. SEEK MEDICAL CARE IF:   You have any abnormal vaginal bleeding.  You are having menstrual periods at the age of 52 or older.  You have bleeding after sexual intercourse.  You are taking tomoxifen and develop vaginal bleeding.  Your stomach is growing, and you are not pregnant.  You have pain with sexual intercourse.  You have stomach or pelvis pain.  You have weight loss for no known reason.  You have pain or difficulty with urination. [facility]  Cancer Information Service (CIS) provides accurate, up-to-date information on cancer to patients and their families, health professionals, and  the general public:  Phone: 1-800-4-CANCER (1-800-422-6237).  Internet: http://www.cancer.gov NCI's website contains complete information about cancer causes and prevention, screening and diagnosis, treatment and survivorship, clinical trials, statistics, funding, training, and employment opportunities, and the Institute and its programs. CLINICAL TRIALS A woman who is interested in being part of a clinical trial should talk with her caregiver. NCI's website (http://www.cancer.gov) provides general information about clinical trials. It also offers detailed information about specific ongoing studies of uterine   cancer by linking to PDQ, a cancer information database developed by the NCI. The Cancer Information Service at 1-800-4-CANCER can answer questions about cancer and provide information from the PDQ database. Document Released: 10/01/2005 Document Revised: 12/24/2011 Document Reviewed: 08/04/2009 [employer] Patient Information 2014 [employer].  

## 2013-06-18 ENCOUNTER — Encounter: Payer: Self-pay | Admitting: Obstetrics & Gynecology

## 2013-06-18 NOTE — Telephone Encounter (Signed)
Called patient and notified of GYN oncology appt made for her. Patient agrees and satisfied.

## 2013-06-22 ENCOUNTER — Encounter: Payer: Self-pay | Admitting: Internal Medicine

## 2013-06-22 ENCOUNTER — Ambulatory Visit: Payer: Medicare Other | Attending: Family Medicine | Admitting: Internal Medicine

## 2013-06-22 VITALS — BP 189/103 | HR 125 | Temp 98.6°F | Resp 16 | Ht 66.0 in | Wt 145.0 lb

## 2013-06-22 DIAGNOSIS — F411 Generalized anxiety disorder: Secondary | ICD-10-CM

## 2013-06-22 DIAGNOSIS — D649 Anemia, unspecified: Secondary | ICD-10-CM

## 2013-06-22 DIAGNOSIS — D500 Iron deficiency anemia secondary to blood loss (chronic): Secondary | ICD-10-CM | POA: Insufficient documentation

## 2013-06-22 DIAGNOSIS — N898 Other specified noninflammatory disorders of vagina: Secondary | ICD-10-CM | POA: Insufficient documentation

## 2013-06-22 DIAGNOSIS — C541 Malignant neoplasm of endometrium: Secondary | ICD-10-CM

## 2013-06-22 DIAGNOSIS — C549 Malignant neoplasm of corpus uteri, unspecified: Secondary | ICD-10-CM

## 2013-06-22 DIAGNOSIS — C55 Malignant neoplasm of uterus, part unspecified: Secondary | ICD-10-CM | POA: Insufficient documentation

## 2013-06-22 DIAGNOSIS — I10 Essential (primary) hypertension: Secondary | ICD-10-CM

## 2013-06-22 MED ORDER — ALPRAZOLAM 0.5 MG PO TABS: 0.5000 mg | ORAL_TABLET | Freq: Two times a day (BID) | ORAL | Status: DC | PRN

## 2013-06-22 MED ORDER — LISINOPRIL 10 MG PO TABS: 10.0000 mg | ORAL_TABLET | Freq: Every day | ORAL | Status: DC

## 2013-06-22 MED ORDER — HYDROCHLOROTHIAZIDE 25 MG PO TABS: 25.0000 mg | ORAL_TABLET | Freq: Every day | ORAL | Status: DC

## 2013-06-22 NOTE — Progress Notes (Signed)
PT HERE FOR BP RECHECK S/P UNCONTROLLED HTN NEED XANAX REFILL

## 2013-06-22 NOTE — Progress Notes (Signed)
Patient ID: Stephanie [name], female   DOB: 1946-02-17, 67 y.o.   MRN: [id number] PCP:  Jeanann Lewandowsky, MD    Chief Complaint:  anxiety  HPI: 67 year old female who was recently diagnosed with vaginal bleeding and found to have grade 1 adenocarcinoma of the uterus on biopsy and has been following with her GYN and referred to cancer Center here for symptoms of ongoing anxiety. She reports that she has increased anxiety after the diagnosis and is worried about her illness. She denies further vaginal bleeding. She denies any headache, blurred vision, dizziness, fever, chills, nausea, vomiting, chest pain, palpitations, abdominal pain, bowel or urinary symptoms. She was given a short course of Xanax recently which does help with her symptoms. She reports that her GYN saw her recently and recommended that she might need hysterectomy. He reports that her blood pressure has also gone up due to stress. Allergies: Allergies  Allergen Reactions  . Iron Anaphylaxis  . Penicillins Hives and Swelling    PT, STATED ALLERGIES TO ALL " CILLINS" Tongue swelling  . Diamox [Acetazolamide] Nausea And Vomiting    Severe nausea and vomitting    Prior to Admission medications   Medication Sig Start Date End Date Taking? Authorizing Provider  ALPRAZolam Prudy Feeler) 0.5 MG tablet Take 1 tablet (0.5 mg total) by mouth 2 (two) times daily as needed for sleep or anxiety. 06/22/13   Djuan Talton, MD  bimatoprost (LUMIGAN) 0.03 % ophthalmic solution Place 1 drop into both eyes at bedtime.    Historical Provider, MD  brimonidine-timolol (COMBIGAN) 0.2-0.5 % ophthalmic solution Place 1 drop into both eyes every 12 (twelve) hours.    Historical Provider, MD  Dextromethorphan Polistirex (DELSYM PO) Take 10 mLs by mouth 2 (two) times daily as needed (for cough).     Historical Provider, MD  Ferrous Sulfate (IRON) 325 (65 FE) MG TABS Take 1 tablet by mouth 2 (two) times daily. 05/15/13   Jeanann Lewandowsky, MD   hydrochlorothiazide (HYDRODIURIL) 25 MG tablet Take 1 tablet (25 mg total) by mouth daily. 06/22/13   Alaura Schippers, MD  HYDROcodone-acetaminophen (NORCO/VICODIN) 5-325 MG per tablet Take 1 tablet by mouth every 6 (six) hours as needed for pain. 06/12/13   Arman Filter, NP  lisinopril (PRINIVIL,ZESTRIL) 10 MG tablet Take 1 tablet (10 mg total) by mouth daily. 06/22/13   Adele Milson, MD  medroxyPROGESTERone (PROVERA) 10 MG tablet Take 1 tablet (10 mg total) by mouth daily. 06/12/13 07/15/13  Arman Filter, NP    Past Medical History  Diagnosis Date  . Glaucoma   . Anxiety   . PNA (pneumonia)   . Cataract     No past surgical history on file.  Social History:  reports that she has never smoked. She has never used smokeless tobacco. She reports that she does not drink alcohol or use illicit drugs.  Family History  Problem Relation Age of Onset  . Hypertension Father   . Vision loss Maternal Grandmother     Review of Systems:  Outlined in history of present illness  Physical Exam:  There were no vitals filed for this visit.  Constitutional: Vital signs reviewed.  Patient is a well-developed and well-nourished in no acute distress and cooperative with exam. Alert and oriented x3.  HEENT: Pallor present, moist oral mucosa Cardiovascular: RRR, S1 normal, S2 normal, no MRG,  Pulmonary/Chest: CTAB, no wheezes, rales, or rhonchi Abdominal: Soft. Non-tender, non-distended, bowel sounds are normal, Extremities: Warm, no edema CNS: AAO x3  Labs  on Admission:  No results found for this or any previous visit (from the past 48 hour(s)).  Radiological Exams on Admission: No results found.  Assessment/Plan Anxiety Secondary to recent diagnosis of uterine adenocarcinoma. I will give her a short course of Xanax 0.5 mg twice a day when necessary for anxiety. She has a followup at the cancer Center in 10 days and I recommended her to keep that appointment. She will also be following  with her GYN doctor.  Hypertension We'll refill her prescriptions. Blood pressure elevated due to stress.  Uterine adenocarcinoma Has followup with her GYN and cancer center  Blood loss anemia Secondary to vaginal bleed. We'll address following her visit at the cancer center and her GYN.   Patient to followup in the clinic in one month    Admire Bunnell 06/22/2013, 5:34 PM

## 2013-06-23 ENCOUNTER — Telehealth: Payer: Self-pay | Admitting: *Deleted

## 2013-06-23 NOTE — Telephone Encounter (Addendum)
Pt left message stating that she saw Dr. Debroah Loop on 9/3.  She has now been bleeding x3 days and is passing clots. She is taking Provera. She wants to know if there is anything else she needs to do.  I returned pt's call and discussed her concerns. She states that she has had very little bleeding but is concerned about the amount of blood clots she had passed on 9/6-9/7.  The clots were about the size of a 50 cent piece and she had abdominal cramping as well. I informed pt that these size clots most likely are not large enough to cause a problem. I advised that she take ibuprofen for the cramps and ferrous sulfate to be sure her iron level does not drop too low.  Pt stated that she cannot take either of these medicines. The ibuprofen causes her to bleed more and ferrous sulfate causes severe constipation which is not helped with stool softeners and dietary changes. I recommended to pt that she continue provera as prescribed and to discuss all of her concerns with Dr. Duard Brady at her visit next week. Pt voiced understanding.

## 2013-07-01 ENCOUNTER — Ambulatory Visit: Payer: Medicare Other | Admitting: Gynecologic Oncology

## 2013-07-09 ENCOUNTER — Ambulatory Visit: Payer: Medicare Other | Attending: Gynecologic Oncology | Admitting: Gynecologic Oncology

## 2013-07-09 ENCOUNTER — Encounter: Payer: Self-pay | Admitting: Gynecologic Oncology

## 2013-07-09 ENCOUNTER — Other Ambulatory Visit: Payer: Medicare Other | Admitting: Lab

## 2013-07-09 VITALS — BP 162/100 | HR 134 | Temp 99.7°F | Resp 16 | Ht 65.95 in | Wt 139.5 lb

## 2013-07-09 DIAGNOSIS — D259 Leiomyoma of uterus, unspecified: Secondary | ICD-10-CM

## 2013-07-09 DIAGNOSIS — C541 Malignant neoplasm of endometrium: Secondary | ICD-10-CM

## 2013-07-09 DIAGNOSIS — R9389 Abnormal findings on diagnostic imaging of other specified body structures: Secondary | ICD-10-CM

## 2013-07-09 DIAGNOSIS — N950 Postmenopausal bleeding: Secondary | ICD-10-CM

## 2013-07-09 LAB — COMPREHENSIVE METABOLIC PANEL (CC13)
ALT: <6 U/L (ref 0–55)
AST: 9 U/L (ref 5–34)
CO2: 23 mEq/L (ref 22–29)
Chloride: 109 mEq/L (ref 98–109)
Sodium: 142 mEq/L (ref 136–145)
Total Bilirubin: 0.4 mg/dL (ref 0.20–1.20)
Total Protein: 7.8 g/dL (ref 6.4–8.3)

## 2013-07-09 LAB — CBC WITH DIFFERENTIAL/PLATELET
BASO%: 0.7 % (ref 0.0–2.0)
EOS%: 1.5 % (ref 0.0–7.0)
MCH: 24.2 pg — ABNORMAL LOW (ref 25.1–34.0)
MCHC: 30.7 g/dL — ABNORMAL LOW (ref 31.5–36.0)
MONO#: 0.5 10*3/uL (ref 0.1–0.9)
RBC: 3.08 10*6/uL — ABNORMAL LOW (ref 3.70–5.45)
WBC: 7.6 10*3/uL (ref 3.9–10.3)
lymph#: 1.2 10*3/uL (ref 0.9–3.3)

## 2013-07-09 MED ORDER — MEDROXYPROGESTERONE ACETATE 10 MG PO TABS: 10.0000 mg | ORAL_TABLET | Freq: Every day | ORAL | Status: DC

## 2013-07-09 NOTE — Progress Notes (Signed)
Consult Note: Gyn-Onc  Stephanie [name] 67 y.o. female  CC:  Chief Complaint  Patient presents with  . Endometrial cancer    New Consult    HPI: Patient is seen today in consultation at the request of Dr. Scheryl Darter.  Patient is a 67 year old gravida 2 para 1 who has had fibroids for many years. She states she started bleeding as a young lady and never stopped. She states about 2 years ago she began passing "fibroids and blood clots" and that she's able to tell the difference. She states that her generation is of the impression that after menopause surgeries unnecessary and 5 was within smaller. She started having increased bleeding last summer it wasn't until she was scheduled for glaucoma surgery in June of 2013 and was noted that she had a hemoglobin of 7 to this issue to address. She started having increased bleeding Mr. to have hemoglobin of 7 and was brought into the hospital for transfusion. She's had no additional care for her fibroids until June of this year. She was feeling weak and "faint" climbing the stairs to her bedroom. She was seen in the emergency room and had a hemoglobin of 3. At that time she was passing "fibroids". She was admitted to the hospital and transfused. She states that workup was otherwise negative except for the uterine fibroids.  While she was in the hospital on June 28 she underwent a complete pelvic ultrasound. The uterus is enlarged measuring 14.3 x 8.4 x 10.2 cm. It was enlarged and lobular with a number fibroids ranging in size up to 4.6 x 4.2 cm in size. The endometrium was heterogeneous appeared thickened up to 4.5 cm. The adnexa are not identified. She did not have any followup with a thickened endometrium and so again she presented to the emergency room with increased bleeding. She was then ultimately referred to Dr. Debroah Loop and had an endometrial biopsy on August 18 that revealed a grade 1 endometrioid adenocarcinoma.  The patient states that she can take  iron tablets and has in the past however, she experiences severe constipation and needs to drink a bottle of prune juice to have a bowel movement. Stool softeners do not help her. She states that with IV iron she had a drop in her blood pressure and she required epinephrine. She was recently in the emergency room again with increased bleeding and had a hemoglobin of 9. She was started on Provera and took this for a time and this decreased to bleeding but she never got the refills prescribed.  She's never had a colonoscopy. She's not had a mammogram since early 75s.  Review of Systems  Constitutional: Denies fever. Skin: No rash, sores, jaundice, itching, or dryness.  Cardiovascular: No chest pain, shortness of breath, or edema  Pulmonary: No cough or wheeze.  Gastro Intestinal: No nausea, vomiting, constipation, or diarrhea reported. No bright red blood per rectum or change in bowel movement.  Genitourinary: No frequency, urgency, or dysuria.  Denies vaginal bleeding and discharge.  Musculoskeletal: No myalgia, arthralgia, joint swelling or pain.  Neurologic: No weakness, numbness, or change in gait. Visually impaired due to glaucoma Psychology: Very anxious and nervous today and very concerned about not waking up from surgery.   Current Meds:  Outpatient Encounter Prescriptions as of 07/09/2013  Medication Sig Dispense Refill  . ALPRAZolam (XANAX) 0.5 MG tablet Take 1 tablet (0.5 mg total) by mouth 2 (two) times daily as needed for sleep or anxiety.  30 tablet  0  . bimatoprost (LUMIGAN) 0.03 % ophthalmic solution Place 1 drop into both eyes at bedtime.      . brimonidine-timolol (COMBIGAN) 0.2-0.5 % ophthalmic solution Place 1 drop into both eyes every 12 (twelve) hours.      . Dextromethorphan Polistirex (DELSYM PO) Take 10 mLs by mouth 2 (two) times daily as needed (for cough).       . Ferrous Sulfate (IRON) 325 (65 FE) MG TABS Take 1 tablet by mouth 2 (two) times daily.  30 each  2  .  hydrochlorothiazide (HYDRODIURIL) 25 MG tablet Take 1 tablet (25 mg total) by mouth daily.  30 tablet  3  . HYDROcodone-acetaminophen (NORCO/VICODIN) 5-325 MG per tablet Take 1 tablet by mouth every 6 (six) hours as needed for pain.  12 tablet  0  . lisinopril (PRINIVIL,ZESTRIL) 10 MG tablet Take 1 tablet (10 mg total) by mouth daily.  30 tablet  3  . medroxyPROGESTERone (PROVERA) 10 MG tablet Take 1 tablet (10 mg total) by mouth daily.  20 tablet  2  . [DISCONTINUED] medroxyPROGESTERone (PROVERA) 10 MG tablet Take 1 tablet (10 mg total) by mouth daily.  12 tablet  2   No facility-administered encounter medications on file as of 07/09/2013.    Allergy:  Allergies  Allergen Reactions  . Iron Anaphylaxis    IV Iron  . Penicillins Hives and Swelling    PT, STATED ALLERGIES TO ALL " CILLINS" Tongue swelling  . Diamox [Acetazolamide] Nausea And Vomiting    Severe nausea and vomitting    Social Hx:   History   Social History  . Marital Status: Divorced    Spouse Name: N/A    Number of Children: N/A  . Years of Education: N/A   Occupational History  . Not on file.   Social History Main Topics  . Smoking status: Never Smoker   . Smokeless tobacco: Never Used  . Alcohol Use: No  . Drug Use: No  . Sexual Activity: Not Currently    Birth Control/ Protection: None   Other Topics Concern  . Not on file   Social History Narrative  . No narrative on file    Past Surgical Hx: History reviewed. No pertinent past surgical history.  Past Medical Hx:  Past Medical History  Diagnosis Date  . Glaucoma   . Anxiety   . PNA (pneumonia)   . Cataract     Oncology Hx:    Endometrial cancer   06/01/2013 Initial Diagnosis Endometrial cancer    Surgery planned 107/2014 with Dr. Stanford Breed    Family Hx: Her mother had liver cancer that metastasized her lungs and brain. She was a 3 pack a day smoker Family History  Problem Relation Age of Onset  . Hypertension Father   . Vision  loss Maternal Grandmother     Vitals:  Blood pressure 162/100, pulse 134, temperature 99.7 F (37.6 C), temperature source Oral, resp. rate 16, height 5' 5.95" (1.675 m), weight 139 lb 8 oz (63.277 kg).  Physical Exam: Then, well-nourished, well-developed female in no acute distress.  Neck: Supple, no lymphadenopathy no thyromegaly.  Lungs: Clear to auscultation bilaterally.  Cardiac: Tachycardic regular rhythm. There is no murmurs or rubs.  Abdomen: Soft, nontender, nondistended. Palpable mass in the lower quadrant extending towards the right side. There is no rebound or guarding. There is no hepatosplenomegaly. There is no ascites.  Groins: No lymphadenopathy.  Extremities: No edema  Pelvic: Normal external female genitalia. Vagina is atrophic the  cervix is visualized. There was blood filling the posterior aspect of the vagina. The cervix is without lesions. Bimanual examination reveals the uterus to be approximately 16 weeks size. The uterus is overall is globular approximately 14 weeks size it appears that there's a pedunculated 5 cm fibroid off towards the patient's right. The parametria are free.  Assessment/Plan:  67 year old with fibroid uterus and a long history of postmenopausal bleeding attributed by the patient to fibroids who has a clinical stage I grade 1 endometrioid adenocarcinoma. I have refilled her Provera she will pick up at the drugstore. We also will check a CBC and chemistries today.  She and I discussed surgery at some length. She knows that we will need to proceed with a total abdominal history to me bilateral salpingo-oophorectomy and selective lymphadenectomy pending depth of invasion. This would need to be done abdominally as the uterus is still enlarged. She is tentatively scheduled for surgery on October 7 with Dr. Stanford Breed. She understands that she'll have the opportunity to meet with him prior to surgery and ask questions. She wishes to ensure that we  will notify him of her fears regarding anesthesia.  We discussed anesthesia and I told her that she statistically has a higher risk of dying driving home from her appointment today that she does regarding anesthesia. She was reassured to hear this. For surgery including but not limited to bleeding, injury to surround organs, infection, thromboembolic disease were discussed with the patient. We did discuss that she'll need to go home on postoperative Lovenox for approximately 4 weeks. The patient states her some would not be able to give for these injections that she'll be able to learn to give herself and we'll need to accomplish this in the hospital. As stated above we'll check her CBC today. She significantly anemic to be brought in next week for blood transfusion. Alternatively she could also be premedicated on October 6, transfused, and set up for surgery the following Tuesday. Her questions were elicited and answered her satisfaction. She states she's feeling much better after her appointment today and feels that her heart rate is dropping in a positive way. She was very appreciative of her visit with her to followup with me and she feels comfortable with me in the postoperative setting. Discuss with her that this would not be an issue.  She is discharged from the hospital, her son lives with her we'll be able to assist her. Liat Mayol A., MD 07/09/2013, 11:43 AM

## 2013-07-09 NOTE — Patient Instructions (Signed)
Hysterectomy Care After These instructions give you information on caring for yourself after your procedure. Your doctor may also give you more specific instructions. Call your doctor if you have any problems or questions after your procedure. HOME CARE Healing takes time. You may have discomfort, tenderness, puffiness (swelling), and bruising at the wound site. This may last for 2 weeks. This is normal and will get better.  Only take medicine as told by your doctor.  Do not take aspirin.  Do not drive when taking pain medicine.  Exercise, lift objects, drive, and get back to daily activites as told by your doctor.  Get back to your normal diet and activities as told by your doctor.  Get plenty of rest and sleep.  Do not douche, use tampons, or have sex (intercourse) for at least 6 weeks or as told.  Change your bandages (dressings) as told by your doctor.  Take your temperature during the day.  Take showers for 2 to 3 weeks. Do not take baths.  Do not drink alcohol until your doctor says it is okay.  Take a medicine to help you poop (laxative) as told by your doctor. Try eating bran foods. Drink enough fluids to keep your pee (urine) clear or pale yellow.  Have someone help you at home for 1 to 2 weeks after your surgery.  Keep follow-up doctor visits as told. GET HELP RIGHT AWAY IF:   You have a fever.  You have bad belly (abdominal) pain.  You have chest pain.  You are short of breath.  You pass out (faint).  You have pain, puffiness, or redness of your leg.  You bleed a lot from your vagina and notice clumps of tissue (clots).  You have puffiness, redness, or pain where a tube was put in your vein (IV) or in the wound area.  You have yellowish-white fluid (pus) coming from the wound.  You have a bad smell coming from the wound or bandage.  Your wound pulls apart.  You feel dizzy or lightheaded.  You have pain or bleeding when you pee.  You keep having  watery poop (diarrhea).  You keep feeling sick to your stomach (nauseous) or keep throwing up (vomiting).  You have fluid (discharge) coming from your vagina.  You have a rash.  You have a reaction to your medicine.  You need stronger pain medicine. MAKE SURE YOU:  Understand these instructions.  Will watch your condition.  Will get help right away if you are not doing well or get worse. Document Released: 07/10/2008 Document Revised: 12/24/2011 Document Reviewed: 05/18/2011 [facility] Patient Information 2014 [place], [place]. Hysterectomy Information  A hysterectomy is a procedure where your uterus is surgically removed. It will no longer be possible to have menstrual periods or to become pregnant. The tubes and ovaries can be removed (bilateral salpingo-oopherectomy) during this surgery as well.  REASONS FOR A HYSTERECTOMY  Persistent, abnormal bleeding.  Lasting (chronic) pelvic pain or infection.  The lining of the uterus (endometrium) starts growing outside the uterus (endometriosis).  The endometrium starts growing in the muscle of the uterus (adenomyosis).  The uterus falls down into the vagina (pelvic organ prolapse).  Symptomatic uterine fibroids.  Precancerous cells.  Cervical cancer or uterine cancer. TYPES OF HYSTERECTOMIES  Supracervical hysterectomy. This type removes the top part of the uterus, but not the cervix.  Total hysterectomy. This type removes the uterus and cervix.  Radical hysterectomy. This type removes the uterus, cervix, and the fibrous tissue  that holds the uterus in place in the pelvis (parametrium). WAYS A HYSTERECTOMY CAN BE PERFORMED  Abdominal hysterectomy. A large surgical cut (incision) is made in the abdomen. The uterus is removed through this incision.  Vaginal hysterectomy. An incision is made in the vagina. The uterus is removed through this incision. There are no abdominal incisions.  Conventional laparoscopic hysterectomy. A  thin, lighted tube with a camera (laparoscope) is inserted into 3 or 4 small incisions in the abdomen. The uterus is cut into small pieces. The small pieces are removed through the incisions, or they are removed through the vagina.  Laparoscopic assisted vaginal hysterectomy (LAVH). Three or four small incisions are made in the abdomen. Part of the surgery is performed laparoscopically and part vaginally. The uterus is removed through the vagina.  Robot-assisted laparoscopic hysterectomy. A laparoscope is inserted into 3 or 4 small incisions in the abdomen. A computer-controlled device is used to give the surgeon a 3D image. This allows for more precise movements of surgical instruments. The uterus is cut into small pieces and removed through the incisions or removed through the vagina. RISKS OF HYSTERECTOMY   Bleeding and risk of blood transfusion. Tell your caregiver if you do not want to receive any blood products.  Blood clots in the legs or lung.  Infection.  Injury to surrounding organs.  Anesthesia problems or side effects.  Conversion to an abdominal hysterectomy. WHAT TO EXPECT AFTER A HYSTERECTOMY  You will be given pain medicine.  You will need to have someone with you for the first 3 to 5 days after you go home.  You will need to follow up with your surgeon in 2 to 4 weeks after surgery to evaluate your progress.  You may have early menopause symptoms like hot flashes, night sweats, and insomnia.  If you had a hysterectomy for a problem that was not a cancer or a condition that could lead to cancer, then you no longer need Pap tests. However, even if you no longer need a Pap test, a regular exam is a good idea to make sure no other problems are starting. Document Released: 03/27/2001 Document Revised: 12/24/2011 Document Reviewed: 05/12/2011 [facility] Patient Information 2014 [place], [place]. Cancer of the Uterus The uterus is part of a woman's reproductive system. It is  the hollow, pear-shaped organ where a baby grows. The uterus is in the pelvis between the bladder and the rectum. The narrow, lower portion of the uterus is the cervix. The fallopian tubes extend from either side of the top of the uterus to the ovaries. The wall of the uterus has two layers of tissue. The inner layer, or lining, is the endometrium. The outer layer is muscle tissue called the myometrium. In women of childbearing age, the lining of the uterus grows and thickens each month to prepare for pregnancy. If a woman does not become pregnant, the thick, bloody lining flows out of the body through the vagina. This flow is called menstruation. TYPES OF UTERINE CANCER  The most common type of cancer of the uterus begins in the lining (endometrium). It is called endometrial cancer, uterine cancer, or cancer of the uterus. It is seen in 2% to 3% of women.  A different type of cancer, uterine sarcoma, develops in the muscle (myometrium). Cancer that begins in the cervix is also a different type of cancer.  Rarely, a noncancerous fibroid tumor of the uterus develops into a sarcoma. CAUSES  No one knows the exact causes of  uterine cancer. But it is clear that this disease is not contagious. No one can "catch" cancer from another person. Women who get this disease are more likely than other women to have certain risk factors. A risk factor is something that increases a person's chance of developing the disease.  Most women who have known risk factors do not get uterine cancer. On the other hand, many who do get this disease have none of these factors. Doctors can seldom explain why one woman gets uterine cancer and another does not.  Studies have found the following risk factors:  Age. Cancer of the uterus occurs mostly in women over age 26.  Endometrial hyperplasia (enlarged endometrium). The risk of uterine cancer is higher if a woman has endometrial hyperplasia.  Hormone replacement therapy  (HRT). HRT is used to control the symptoms of menopause, to prevent osteoporosis (thinning of the bones), and to reduce the risk of heart disease or stroke. Women who still have their uterus, and use estrogen without progesterone, have an increased risk of uterine cancer. Long-term use and large doses of estrogen seem to increase this risk. Women who use a combination of estrogen and progesterone have a lower risk of uterine cancer than women who use estrogen alone. The progesterone protects the uterus from developing cancer.  Obesity and related conditions. The body stores and releases some of its estrogen in fatty tissue. That is why obese women are more likely than thin women to have higher levels of estrogen in their bodies. High levels of estrogen may be the reason that obese women have an increased risk of developing uterine cancer. The risk of this disease is also higher in women with diabetes or high blood pressure. These conditions occur in many obese women.  Tamoxifen. Women taking the drug tamoxifen to prevent or treat breast cancer have an increased risk of uterine cancer. This risk appears to be related to the estrogen-like effect of this drug on the uterus.  Race. White women are more likely than African-American women to get uterine cancer.  Colorectal cancer. Women who have had an inherited form of colorectal cancer have a higher risk of developing uterine cancer than other women.  Infertility.  Beginning menstrual periods before age 38.  Having menstrual periods after age 69.  History of cancer of the ovary or intestine.  Family history of uterine cancer.  Having diabetes, high blood pressure, thyroid or gallbladder disease.  Long-term use of high does of birth control pills. Birth control pills today are low in hormone doses.  Radiation to the abdomen or pelvis.  Smoking. SYMPTOMS  Uterine cancer usually occurs after menopause. But it may also occur around the time that  menopause begins. Abnormal vaginal bleeding is the most common symptom of uterine cancer. Bleeding may start as a watery, blood-streaked flow that gradually contains more blood. Women should not assume that abnormal vaginal bleeding is part of menopause. A woman should see her caregiver if she has any of the following symptoms:  Unusual vaginal bleeding or discharge.  Difficult or painful urination.  Pain during intercourse.  Pain in the pelvic area.  Increased girth (growth) of the stomach.  Any vaginal bleeding after menopause.  Unexplained weight loss. These symptoms can be caused by cancer or other less serious conditions. Most often they are not cancer. But a thorough evaluation is needed to be certain. DIAGNOSIS  If a woman has symptoms that suggest uterine cancer, her caregiver may check her general health and may  order blood and urine tests. The caregiver also may perform one or more of these exams or tests.  Blood and urine tests and chest x-rays. The woman also may have:  Other X-rays.  CT scans.  Ultrasound test.  Magnetic resonance imaging (MRI).  Sigmoidoscopy.  Colonoscopy.  Pelvic exam. A woman will have a pelvic exam to check the vagina, uterus, bladder, and rectum. The caregiver feels these organs for any lumps or changes in their shape or size. To see the upper part of the vagina and the cervix, the caregiver inserts an instrument called a speculum into the vagina.  Pap test. The caregiver collects cells from the cervix and upper vagina. A medical laboratory checks for abnormal cells. The Pap test is better for detecting cancer of the cervix. But cells from inside the uterus usually do not show up on a Pap test. It is not a reliable test for uterine cancer.  Transvaginal ultrasound. The medical caregiver inserts an instrument into the vagina. The instrument aims high-frequency sound waves at the uterus. The pattern of the echoes they produce creates a picture.  If the endometrium looks too thick, the caregiver can do a biopsy.  Biopsy. The medical caregiver removes a sample of tissue from the uterine lining. This usually can be done in the caregiver's office.  Dilatation and Curettage (D&C). In some cases, a woman may need to have a D&C. D&C is usually done as same-day surgery with anesthesia in a hospital. A pathologist examines the tissue (lining of the uterus) to check for cancer cells and other conditions. STAGING   If uterine cancer is diagnosed, the caregiver needs to know the stage, or extent, of the disease to plan the best treatment. Staging is a careful attempt to find out whether the cancer has spread, and if so, to what parts of the body.  When uterine cancer spreads (metastasizes) outside the uterus, cancer cells are often found in nearby lymph nodes, nerves, or blood vessels. If the cancer has reached the lymph nodes, cancer cells may have spread to other lymph nodes and other organs of the body.  Staging is done at the time of surgery. In most cases, the most reliable way to stage this disease is to remove the uterus, cervix, tubes, ovaries, and lymph nodes. A pathologist uses a microscope to examine the uterus and other tissues removed by the surgeon, to determine the extent of the cancer in the pelvis.  If lymph nodes have cancer cells, other parts of the body are examined, to see if it has spread to other organs. MAIN FEATURES OF EACH STAGE OF THE DISEASE: Stage I. The cancer is only in the body of the uterus. It is not in the cervix. Stage II. The cancer has spread from the body of the uterus to the cervix. Stage III. The cancer has spread outside the uterus, but not outside the pelvis (and not to the bladder or rectum). Lymph nodes in the pelvis may contain cancer cells. Stage IV. The cancer has spread into the bladder or rectum. It may have spread beyond the pelvis to other body parts. TREATMENT  Women with uterine cancer have many  treatment options. Most women with uterine cancer are treated with surgery. Some have radiation or chemotherapy. A smaller number of women may be treated with hormonal therapy. Some patients receive a combination of therapies. You may want to consult with another cancer doctor for a second opinion. The caregiver (usually a cancer doctor) is the  best person to describe your treatment choices and to discuss the expected results of treatment. SURGERY  Most women with uterine cancer have surgery to remove the uterus, cervix, tubes, and ovaries (total hysterectomy). This is usually done through an incision in the abdomen.  The doctor may also remove the lymph nodes near the tumor, to see if they contain cancer. If cancer cells have reached the lymph nodes, it may mean that the disease has spread to other parts of the body. If cancer cells have not spread beyond the endometrium, the woman may not need to have any other treatment. The length of the hospital stay may vary from several days to a week. RADIATION THERAPY  In radiation therapy, high-energy rays are used to kill cancer cells. Like surgery, radiation therapy is a local therapy. It affects cancer cells only in the treated area.  Some women with Stage I, II, or III uterine cancer need both radiation therapy and surgery. They may have radiation before surgery to shrink the tumor, or after surgery to destroy any cancer cells that remain in the area. The doctor may suggest radiation treatments for the small number of women who cannot have surgery.  Doctors use two types of radiation therapy to treat uterine cancer:  External radiation. In external radiation therapy, a large machine outside the body is used to aim radiation at the tumor area. The woman usually does not stay overnight (outpatient) at the hospital or clinic, and receives external radiation 5 days a week for several weeks. This schedule helps protect healthy cells and tissue by spreading out  the total dose of radiation. No radioactive materials are put into the body for external radiation therapy.  Internal radiation. In internal radiation therapy, tiny tubes containing a radioactive substance are inserted through the vagina and cervix, into the uterus, and left in place for a few days. The woman stays in the hospital during this treatment. To protect others from radiation exposure, the patient may not be able to have visitors or may have visitors only for a short period of time while the implant is in place. Once the implant is removed, the woman has no radioactivity in her body.  Some patients need both external and internal radiation therapies. CHEMOTHERAPY Chemotherapy is not usually used for endometrial cancer of the uterus. However, with sarcoma of the uterus or of the fibroid, it may be used in combination with surgery. Chemotherapy may also be used with recurring sarcoma, and in patients who cannot have surgery. HORMONE THERAPY Hormonal therapy involves substances that prevent cancer cells from multiplying or growing by attaching to hormone receptors. This causes changes in cancer cells. Before therapy begins, the caregiver may request a hormone receptor test. This special lab test of uterine tissue helps the caregiver learn if estrogen and progesterone receptors are present. If the tissue has receptors, the woman is more likely to respond to hormonal therapy.  Hormonal therapy is called a systemic therapy, because it can affect cancer cells throughout the body. Usually, hormonal therapy is a type of progesterone, taken as a pill or injection.  The doctor may use hormonal therapy for women with uterine cancer who are unable to have surgery or radiation therapy. Also, the doctor may give hormonal therapy to women with uterine cancer that has spread to the lungs or other distant sites. It is also given to women with uterine cancer that has come back.  Hormonal therapy can cause a  number of side effects. Women taking  progesterone may retain fluid, have an increased appetite, and gain weight. Women who are still menstruating may have changes in their periods.  Hormone therapy can be used in combination with surgery or radiation. HOME CARE INSTRUCTIONS   Maintain a normal weight with a healthy balanced diet and exercise.  If you have diabetes, high blood pressure, thyroid or gallbladder disease, keep them in control with your caregiver's treatment and recommendations.  Do not smoke.  Do not take estrogen without taking progesterone with it, for menopausal symptoms.  Join a support group or get counseling, if you would like help dealing with your cancer.  If you are on hormone replacement therapy, see your caregiver as recommended, and be informed about the side effects of HRT.  Women with known risk factors should ask their caregiver what symptoms to look for and how often they should have an examination.  Keep your follow-up appointments and take your medicines as advised.  Write your questions down, and take them with you to your caregiver's appointments.  You may want another person to be with you for your appointments, so you do not miss any instructions. SEEK MEDICAL CARE IF:   You have any abnormal vaginal bleeding.  You are having menstrual periods at the age of 51 or older.  You have bleeding after sexual intercourse.  You are taking tomoxifen and develop vaginal bleeding.  Your stomach is growing, and you are not pregnant.  You have pain with sexual intercourse.  You have stomach or pelvis pain.  You have weight loss for no known reason.  You have pain or difficulty with urination. [facility]  Cancer Information Service (CIS) provides accurate, up-to-date information on cancer to patients and their families, health professionals, and the general public:  Phone: 1-800-4-CANCER (9308428287).  Internet:  http://www.cancer.gov NCI's website contains complete information about cancer causes and prevention, screening and diagnosis, treatment and survivorship, clinical trials, statistics, funding, training, and employment opportunities, and [employer] and its programs. CLINICAL TRIALS A woman who is interested in being part of a clinical trial should talk with her caregiver. NCI's website (http://www.johnson-fowler.biz/) provides general information about clinical trials. It also offers detailed information about specific ongoing studies of uterine cancer by linking to PDQ, a cancer information database developed by the NCI. The Cancer Information Service at 1-800-4-CANCER can answer questions about cancer and provide information from the PDQ database. Document Released: 10/01/2005 Document Revised: 12/24/2011 Document Reviewed: 08/04/2009 [facility] Patient Information 2014 [place], [place].

## 2013-07-10 ENCOUNTER — Other Ambulatory Visit: Payer: Self-pay | Admitting: *Deleted

## 2013-07-10 DIAGNOSIS — C541 Malignant neoplasm of endometrium: Secondary | ICD-10-CM

## 2013-07-13 ENCOUNTER — Encounter (HOSPITAL_COMMUNITY): Payer: Self-pay | Admitting: Pharmacy Technician

## 2013-07-16 NOTE — Patient Instructions (Addendum)
Stephanie [name]  07/16/2013                           YOUR PROCEDURE IS SCHEDULED ON: 07/21/13               PLEASE REPORT TO SHORT STAY CENTER AT : 5:15 AM               CALL THIS NUMBER IF ANY PROBLEMS THE DAY OF SURGERY :               [phone]                      REMEMBER:   Do not eat food or drink liquids AFTER MIDNIGHT   Take these medicines the morning of surgery with A SIP OF WATER: MAY TAKE XANAX IF NEEDED / USE EYE DROPS AS USUAL   Do not wear jewelry, make-up   Do not wear lotions, powders, or perfumes.   Do not shave legs or underarms 12 hrs. before surgery (men may shave face)  Do not bring valuables to the hospital.  Contacts, dentures or bridgework may not be worn into surgery.  Leave suitcase in the car. After surgery it may be brought to your room.  For patients admitted to the hospital more than one night, checkout time is 11:00                          The day of discharge.   Patients discharged the day of surgery will not be allowed to drive home                             If going home same day of surgery, must have someone stay with you first                           24 hrs at home and arrange for some one to drive you home from hospital.    Special Instructions:   Please read over the following fact sheets that you were given:                1. CLEAR LIQUIDS ONLY FOR 24 HRS PRE-OP                      2. [facility] PREPARING FOR SURGERY SHEET               3. INCENTIVE SPIROMETER                                                X_____________________________________________________________________        Failure to follow these instructions may result in cancellation of your surgery

## 2013-07-17 ENCOUNTER — Encounter (HOSPITAL_COMMUNITY): Payer: Self-pay

## 2013-07-17 ENCOUNTER — Encounter (HOSPITAL_COMMUNITY)
Admission: RE | Admit: 2013-07-17 | Discharge: 2013-07-17 | Disposition: A | Discharge status: Home / Self Care | Payer: Medicare Other | Source: Ambulatory Visit | Attending: Gynecology | Admitting: Gynecology

## 2013-07-17 ENCOUNTER — Ambulatory Visit: Payer: Medicare Other

## 2013-07-17 ENCOUNTER — Ambulatory Visit (HOSPITAL_COMMUNITY): Admission: RE | Admit: 2013-07-17 | Payer: Medicare Other | Source: Ambulatory Visit

## 2013-07-17 ENCOUNTER — Ambulatory Visit (HOSPITAL_COMMUNITY)
Admission: RE | Admit: 2013-07-17 | Discharge: 2013-07-17 | Disposition: A | Discharge status: Home / Self Care | Payer: Medicare Other | Source: Ambulatory Visit | Attending: Gynecologic Oncology | Admitting: Gynecologic Oncology

## 2013-07-17 VITALS — BP 132/79 | HR 84 | Temp 98.7°F | Resp 20

## 2013-07-17 VITALS — BP 161/101 | HR 125 | Temp 98.6°F | Resp 16 | Ht 66.0 in | Wt 141.1 lb

## 2013-07-17 DIAGNOSIS — D649 Anemia, unspecified: Secondary | ICD-10-CM | POA: Insufficient documentation

## 2013-07-17 DIAGNOSIS — C541 Malignant neoplasm of endometrium: Secondary | ICD-10-CM

## 2013-07-17 DIAGNOSIS — C549 Malignant neoplasm of corpus uteri, unspecified: Secondary | ICD-10-CM | POA: Insufficient documentation

## 2013-07-17 HISTORY — DX: Anemia, unspecified: D64.9

## 2013-07-17 HISTORY — DX: Malignant neoplasm of endometrium: C54.1

## 2013-07-17 HISTORY — DX: Essential (primary) hypertension: I10

## 2013-07-17 HISTORY — DX: Adverse effect of unspecified anesthetic, initial encounter: T41.45XA

## 2013-07-17 HISTORY — DX: Sleep disorder, unspecified: G47.9

## 2013-07-17 HISTORY — DX: Abnormal uterine and vaginal bleeding, unspecified: N93.9

## 2013-07-17 HISTORY — DX: Other specified postprocedural states: Z98.890

## 2013-07-17 HISTORY — DX: Personal history of other medical treatment: Z92.89

## 2013-07-17 HISTORY — DX: Benign neoplasm of connective and other soft tissue, unspecified: D21.9

## 2013-07-17 HISTORY — DX: Other complications of anesthesia, initial encounter: T88.59XA

## 2013-07-17 HISTORY — DX: Legal blindness, as defined in USA: H54.8

## 2013-07-17 HISTORY — DX: Other specified postprocedural states: R11.2

## 2013-07-17 LAB — CBC WITH DIFFERENTIAL/PLATELET
Basophils Absolute: 0 10*3/uL (ref 0.0–0.1)
Basophils Relative: 0 % (ref 0–1)
Eosinophils Absolute: 0.1 10*3/uL (ref 0.0–0.7)
Hemoglobin: 8 g/dL — ABNORMAL LOW (ref 12.0–15.0)
Lymphocytes Relative: 25 % (ref 12–46)
MCHC: 29.1 g/dL — ABNORMAL LOW (ref 30.0–36.0)
Monocytes Absolute: 0.5 10*3/uL (ref 0.1–1.0)
Monocytes Relative: 7 % (ref 3–12)
Neutro Abs: 4.7 10*3/uL (ref 1.7–7.7)
Platelets: 297 10*3/uL (ref 150–400)
RDW: 21.6 % — ABNORMAL HIGH (ref 11.5–15.5)
WBC: 7.1 10*3/uL (ref 4.0–10.5)

## 2013-07-17 LAB — COMPREHENSIVE METABOLIC PANEL
AST: 12 U/L (ref 0–37)
Albumin: 3.7 g/dL (ref 3.5–5.2)
Alkaline Phosphatase: 50 U/L (ref 39–117)
BUN: 13 mg/dL (ref 6–23)
CO2: 26 mEq/L (ref 19–32)
Chloride: 102 mEq/L (ref 96–112)
Creatinine, Ser: 0.71 mg/dL (ref 0.50–1.10)
GFR calc Af Amer: >90 mL/min (ref >90)
Potassium: 3.7 mEq/L (ref 3.5–5.1)
Total Bilirubin: 0.4 mg/dL (ref 0.3–1.2)
Total Protein: 7.6 g/dL (ref 6.0–8.3)

## 2013-07-17 LAB — URINALYSIS, ROUTINE W REFLEX MICROSCOPIC
Glucose, UA: NEGATIVE mg/dL
Ketones, ur: NEGATIVE mg/dL
Nitrite: NEGATIVE
Protein, ur: NEGATIVE mg/dL
Urobilinogen, UA: 0.2 mg/dL (ref 0.0–1.0)

## 2013-07-17 LAB — URINE MICROSCOPIC-ADD ON

## 2013-07-17 LAB — PREPARE RBC (CROSSMATCH)

## 2013-07-17 MED ORDER — SODIUM CHLORIDE 0.9 % IV SOLN
INTRAVENOUS | Status: DC
  Administered 2013-07-17: 12:00:00 via INTRAVENOUS

## 2013-07-17 NOTE — Progress Notes (Signed)
Abnormal labs routed to Warner Mccreedy NP

## 2013-07-18 LAB — TYPE AND SCREEN
ABO/RH(D): O POS
Antibody Screen: NEGATIVE
Unit division: 0
Unit division: 0

## 2013-07-18 LAB — URINE CULTURE: Colony Count: 75000

## 2013-07-20 ENCOUNTER — Other Ambulatory Visit (HOSPITAL_COMMUNITY): Payer: Medicare Other

## 2013-07-20 ENCOUNTER — Encounter (HOSPITAL_COMMUNITY): Payer: Self-pay | Admitting: Anesthesiology

## 2013-07-20 ENCOUNTER — Ambulatory Visit (HOSPITAL_COMMUNITY)
Admission: RE | Admit: 2013-07-20 | Discharge: 2013-07-20 | Disposition: A | Discharge status: Home / Self Care | Payer: Medicare Other | Source: Ambulatory Visit | Attending: Gynecology | Admitting: Gynecology

## 2013-07-20 ENCOUNTER — Telehealth: Payer: Self-pay | Admitting: Gynecologic Oncology

## 2013-07-20 ENCOUNTER — Encounter (HOSPITAL_COMMUNITY)
Admission: RE | Admit: 2013-07-20 | Discharge: 2013-07-20 | Disposition: A | Discharge status: Home / Self Care | Payer: Medicare Other | Source: Ambulatory Visit | Attending: Gynecology | Admitting: Gynecology

## 2013-07-20 NOTE — Telephone Encounter (Signed)
Patient informed of chest xray results.  Continue to proceed with surgery in the am.  No concerns voiced.

## 2013-07-20 NOTE — Anesthesia Preprocedure Evaluation (Addendum)
Anesthesia Evaluation  Patient identified by MRN, date of birth, ID band Patient awake    Reviewed: Allergy & Precautions, H&P , NPO status , Patient's Chart, lab work & pertinent test results  History of Anesthesia Complications (+) PONV  Airway Mallampati: II TM Distance: >3 FB Neck ROM: Full    Dental  (+) Dental Advisory Given, Loose and Poor Dentition Very loose upper four front teeth. Patient was given a dental advisory.:   Pulmonary shortness of breath, pneumonia -, resolved,  Denies SOB today. breath sounds clear to auscultation  Pulmonary exam normal       Cardiovascular hypertension, Pt. on medications negative cardio ROS  Rhythm:Regular Rate:Normal  ECG: 04-11-13:   [id number]   Neuro/Psych PSYCHIATRIC DISORDERS Anxiety negative neurological ROS     GI/Hepatic negative GI ROS, Neg liver ROS,   Endo/Other  negative endocrine ROS  Renal/GU negative Renal ROS  negative genitourinary   Musculoskeletal negative musculoskeletal ROS (+)   Abdominal   Peds negative pediatric ROS (+)  Hematology Anemia. HGB 8.0 on 07-17-13   Anesthesia Other Findings   Reproductive/Obstetrics negative OB ROS                        Anesthesia Physical Anesthesia Plan  ASA: III  Anesthesia Plan: General   Post-op Pain Management:    Induction: Intravenous  Airway Management Planned: Oral ETT  Additional Equipment:   Intra-op Plan:   Post-operative Plan: Extubation in OR  Informed Consent: I have reviewed the patients History and Physical, chart, labs and discussed the procedure including the risks, benefits and alternatives for the proposed anesthesia with the patient or authorized representative who has indicated his/her understanding and acceptance.   Dental advisory given  Plan Discussed with: CRNA  Anesthesia Plan Comments:         Anesthesia Quick Evaluation

## 2013-07-20 NOTE — Telephone Encounter (Signed)
Called to speak with patient about pre-op status.  Taking in clear liquids currently and planning on administering fleets enema this evening.  She is upset that her chest xray was not performed on Friday when she was here for her pre-op appointment.  Reassured.  She has plans for her son to bring her to the hospital later today to obtain the chest xray.  Instructed to call for any needs or concerns.

## 2013-07-21 ENCOUNTER — Encounter (HOSPITAL_COMMUNITY): Payer: Self-pay | Admitting: *Deleted

## 2013-07-21 ENCOUNTER — Inpatient Hospital Stay (HOSPITAL_COMMUNITY)
Admission: RE | Admit: 2013-07-21 | Discharge: 2013-07-25 | DRG: 740 | Disposition: A | Discharge status: Home / Self Care | Payer: Medicare Other | Source: Ambulatory Visit | Attending: Obstetrics & Gynecology | Admitting: Obstetrics & Gynecology

## 2013-07-21 ENCOUNTER — Inpatient Hospital Stay (HOSPITAL_COMMUNITY): Payer: Medicare Other | Admitting: Anesthesiology

## 2013-07-21 ENCOUNTER — Encounter (HOSPITAL_COMMUNITY)
Admission: RE | Disposition: A | Discharge status: Home / Self Care | Payer: Self-pay | Source: Ambulatory Visit | Attending: Obstetrics & Gynecology

## 2013-07-21 ENCOUNTER — Encounter (HOSPITAL_COMMUNITY): Payer: Self-pay | Admitting: Anesthesiology

## 2013-07-21 DIAGNOSIS — D252 Subserosal leiomyoma of uterus: Secondary | ICD-10-CM | POA: Diagnosis present

## 2013-07-21 DIAGNOSIS — Z880 Allergy status to penicillin: Secondary | ICD-10-CM

## 2013-07-21 DIAGNOSIS — D649 Anemia, unspecified: Secondary | ICD-10-CM | POA: Diagnosis present

## 2013-07-21 DIAGNOSIS — C549 Malignant neoplasm of corpus uteri, unspecified: Principal | ICD-10-CM | POA: Diagnosis present

## 2013-07-21 DIAGNOSIS — C541 Malignant neoplasm of endometrium: Secondary | ICD-10-CM | POA: Diagnosis present

## 2013-07-21 DIAGNOSIS — I10 Essential (primary) hypertension: Secondary | ICD-10-CM | POA: Diagnosis present

## 2013-07-21 DIAGNOSIS — Z79899 Other long term (current) drug therapy: Secondary | ICD-10-CM

## 2013-07-21 DIAGNOSIS — R339 Retention of urine, unspecified: Secondary | ICD-10-CM | POA: Diagnosis not present

## 2013-07-21 DIAGNOSIS — K560 Paralytic ileus: Secondary | ICD-10-CM | POA: Diagnosis not present

## 2013-07-21 HISTORY — PX: SALPINGOOPHORECTOMY: SHX82

## 2013-07-21 HISTORY — PX: ABDOMINAL HYSTERECTOMY: SHX81

## 2013-07-21 HISTORY — PX: LAPAROTOMY: SHX154

## 2013-07-21 LAB — TYPE AND SCREEN
ABO/RH(D): O POS
Antibody Screen: NEGATIVE

## 2013-07-21 SURGERY — LAPAROTOMY, EXPLORATORY
Anesthesia: General | Site: Abdomen | Wound class: Clean Contaminated

## 2013-07-21 MED ORDER — TRAMADOL HCL 50 MG PO TABS
100.0000 mg | ORAL_TABLET | Freq: Four times a day (QID) | ORAL | Status: DC
  Administered 2013-07-21 – 2013-07-25 (×5): 100 mg via ORAL
  Filled 2013-07-21 (×7): qty 2

## 2013-07-21 MED ORDER — METRONIDAZOLE IN NACL 5-0.79 MG/ML-% IV SOLN
500.0000 mg | INTRAVENOUS | Status: AC
  Administered 2013-07-21: .5 g via INTRAVENOUS

## 2013-07-21 MED ORDER — METOCLOPRAMIDE HCL 5 MG/ML IJ SOLN
INTRAMUSCULAR | Status: DC | PRN
  Administered 2013-07-21: 10 mg via INTRAVENOUS

## 2013-07-21 MED ORDER — HYDROMORPHONE HCL PF 1 MG/ML IJ SOLN
INTRAMUSCULAR | Status: AC
  Filled 2013-07-21: qty 1

## 2013-07-21 MED ORDER — METRONIDAZOLE IN NACL 5-0.79 MG/ML-% IV SOLN
INTRAVENOUS | Status: AC
  Filled 2013-07-21: qty 100

## 2013-07-21 MED ORDER — ENOXAPARIN SODIUM 40 MG/0.4ML ~~LOC~~ SOLN
40.0000 mg | SUBCUTANEOUS | Status: AC
  Administered 2013-07-21: 40 mg via SUBCUTANEOUS
  Filled 2013-07-21: qty 0.4

## 2013-07-21 MED ORDER — ALPRAZOLAM 0.5 MG PO TABS: 0.5000 mg | ORAL_TABLET | Freq: Two times a day (BID) | ORAL | Status: DC | PRN

## 2013-07-21 MED ORDER — GLYCOPYRROLATE 0.2 MG/ML IJ SOLN
INTRAMUSCULAR | Status: DC | PRN
  Administered 2013-07-21: 0.6 mg via INTRAVENOUS

## 2013-07-21 MED ORDER — SODIUM CHLORIDE 0.9 % IJ SOLN
INTRAMUSCULAR | Status: AC
  Filled 2013-07-21: qty 20

## 2013-07-21 MED ORDER — ROCURONIUM BROMIDE 100 MG/10ML IV SOLN
INTRAVENOUS | Status: DC | PRN
  Administered 2013-07-21: 40 mg via INTRAVENOUS

## 2013-07-21 MED ORDER — ONDANSETRON HCL 4 MG/2ML IJ SOLN
INTRAMUSCULAR | Status: DC | PRN
  Administered 2013-07-21: 4 mg via INTRAMUSCULAR

## 2013-07-21 MED ORDER — CIPROFLOXACIN IN D5W 400 MG/200ML IV SOLN
INTRAVENOUS | Status: AC
  Filled 2013-07-21: qty 200

## 2013-07-21 MED ORDER — MIDAZOLAM HCL 5 MG/5ML IJ SOLN
INTRAMUSCULAR | Status: DC | PRN
  Administered 2013-07-21: 2 mg via INTRAVENOUS

## 2013-07-21 MED ORDER — ESMOLOL HCL 10 MG/ML IV SOLN
INTRAVENOUS | Status: DC | PRN
  Administered 2013-07-21: 10 mg via INTRAVENOUS
  Administered 2013-07-21 (×2): 20 mg via INTRAVENOUS

## 2013-07-21 MED ORDER — HEPARIN SODIUM (PORCINE) 1000 UNIT/ML IJ SOLN
INTRAMUSCULAR | Status: AC
  Filled 2013-07-21: qty 1

## 2013-07-21 MED ORDER — TIMOLOL MALEATE 0.5 % OP SOLN
1.0000 [drp] | Freq: Two times a day (BID) | OPHTHALMIC | Status: DC
  Administered 2013-07-21 – 2013-07-25 (×8): 1 [drp] via OPHTHALMIC
  Filled 2013-07-21: qty 5

## 2013-07-21 MED ORDER — SUCCINYLCHOLINE CHLORIDE 20 MG/ML IJ SOLN
INTRAMUSCULAR | Status: DC | PRN
  Administered 2013-07-21: 100 mg via INTRAVENOUS

## 2013-07-21 MED ORDER — LISINOPRIL 10 MG PO TABS
10.0000 mg | ORAL_TABLET | Freq: Every day | ORAL | Status: DC
  Administered 2013-07-21 – 2013-07-25 (×5): 10 mg via ORAL
  Filled 2013-07-21 (×5): qty 1

## 2013-07-21 MED ORDER — HYDRALAZINE HCL 20 MG/ML IJ SOLN
INTRAMUSCULAR | Status: AC
  Administered 2013-07-21: 2.5 mg
  Filled 2013-07-21: qty 1

## 2013-07-21 MED ORDER — ONDANSETRON HCL 4 MG/2ML IJ SOLN
4.0000 mg | Freq: Four times a day (QID) | INTRAMUSCULAR | Status: DC | PRN
  Administered 2013-07-21 – 2013-07-23 (×5): 4 mg via INTRAVENOUS
  Filled 2013-07-21 (×5): qty 2

## 2013-07-21 MED ORDER — PHENYLEPHRINE HCL 10 MG/ML IJ SOLN
INTRAMUSCULAR | Status: DC | PRN
  Administered 2013-07-21: 80 ug via INTRAVENOUS

## 2013-07-21 MED ORDER — BRIMONIDINE TARTRATE 0.2 % OP SOLN
1.0000 [drp] | Freq: Two times a day (BID) | OPHTHALMIC | Status: DC
  Administered 2013-07-21 – 2013-07-25 (×8): 1 [drp] via OPHTHALMIC
  Filled 2013-07-21: qty 5

## 2013-07-21 MED ORDER — MAGNESIUM HYDROXIDE 400 MG/5ML PO SUSP
30.0000 mL | Freq: Three times a day (TID) | ORAL | Status: AC
  Administered 2013-07-21 – 2013-07-22 (×3): 30 mL via ORAL
  Filled 2013-07-21 (×3): qty 30

## 2013-07-21 MED ORDER — LACTATED RINGERS IV SOLN
INTRAVENOUS | Status: DC | PRN
  Administered 2013-07-21: 1000 mL
  Administered 2013-07-21: 07:00:00 via INTRAVENOUS

## 2013-07-21 MED ORDER — ONDANSETRON HCL 4 MG PO TABS: 4.0000 mg | ORAL_TABLET | Freq: Four times a day (QID) | ORAL | Status: DC | PRN

## 2013-07-21 MED ORDER — BUPIVACAINE LIPOSOME 1.3 % IJ SUSP
INTRAMUSCULAR | Status: DC | PRN
  Administered 2013-07-21: 20 mL

## 2013-07-21 MED ORDER — ACETAMINOPHEN 500 MG PO TABS
1000.0000 mg | ORAL_TABLET | Freq: Four times a day (QID) | ORAL | Status: DC
  Administered 2013-07-21 – 2013-07-22 (×4): 1000 mg via ORAL
  Filled 2013-07-21 (×21): qty 2

## 2013-07-21 MED ORDER — LIDOCAINE HCL (CARDIAC) 20 MG/ML IV SOLN
INTRAVENOUS | Status: DC | PRN
  Administered 2013-07-21: 100 mg via INTRAVENOUS

## 2013-07-21 MED ORDER — BRIMONIDINE TARTRATE-TIMOLOL 0.2-0.5 % OP SOLN: 1.0000 [drp] | Freq: Two times a day (BID) | OPHTHALMIC | Status: DC

## 2013-07-21 MED ORDER — SODIUM CHLORIDE 0.9 % IV SOLN
INTRAVENOUS | Status: DC | PRN
  Administered 2013-07-21: 20 mL via INTRAMUSCULAR

## 2013-07-21 MED ORDER — HYDROMORPHONE HCL PF 1 MG/ML IJ SOLN
0.5000 mg | INTRAMUSCULAR | Status: DC | PRN
  Administered 2013-07-21 (×2): 0.5 mg via INTRAVENOUS
  Filled 2013-07-21 (×2): qty 1

## 2013-07-21 MED ORDER — NEOSTIGMINE METHYLSULFATE 1 MG/ML IJ SOLN
INTRAMUSCULAR | Status: DC | PRN
  Administered 2013-07-21: 5 mg via INTRAVENOUS

## 2013-07-21 MED ORDER — ZOLPIDEM TARTRATE 5 MG PO TABS: 5.0000 mg | ORAL_TABLET | Freq: Every evening | ORAL | Status: DC | PRN

## 2013-07-21 MED ORDER — DEXAMETHASONE SODIUM PHOSPHATE 10 MG/ML IJ SOLN
INTRAMUSCULAR | Status: DC | PRN
  Administered 2013-07-21: 10 mg via INTRAVENOUS

## 2013-07-21 MED ORDER — KCL IN DEXTROSE-NACL 20-5-0.45 MEQ/L-%-% IV SOLN
INTRAVENOUS | Status: DC
  Administered 2013-07-21: 125 mL/h via INTRAVENOUS
  Administered 2013-07-21 – 2013-07-22 (×2): via INTRAVENOUS
  Administered 2013-07-22 – 2013-07-24 (×2): 125 mL/h via INTRAVENOUS
  Administered 2013-07-24: 13:00:00 via INTRAVENOUS
  Filled 2013-07-21 (×11): qty 1000

## 2013-07-21 MED ORDER — LATANOPROST 0.005 % OP SOLN
1.0000 [drp] | Freq: Every day | OPHTHALMIC | Status: DC
  Administered 2013-07-21 – 2013-07-24 (×4): 1 [drp] via OPHTHALMIC
  Filled 2013-07-21: qty 2.5

## 2013-07-21 MED ORDER — 0.9 % SODIUM CHLORIDE (POUR BTL) OPTIME
TOPICAL | Status: DC | PRN
  Administered 2013-07-21: 2000 mL

## 2013-07-21 MED ORDER — PHENYLEPHRINE HCL 10 MG/ML IJ SOLN
10.0000 mg | INTRAVENOUS | Status: DC | PRN
  Administered 2013-07-21: 50 ug/min via INTRAVENOUS

## 2013-07-21 MED ORDER — ENOXAPARIN SODIUM 40 MG/0.4ML ~~LOC~~ SOLN
40.0000 mg | SUBCUTANEOUS | Status: DC
  Administered 2013-07-22 – 2013-07-25 (×4): 40 mg via SUBCUTANEOUS
  Filled 2013-07-21 (×5): qty 0.4

## 2013-07-21 MED ORDER — KETAMINE HCL 10 MG/ML IJ SOLN
INTRAMUSCULAR | Status: DC | PRN
  Administered 2013-07-21: 20 mg via INTRAVENOUS

## 2013-07-21 MED ORDER — HEPARIN SODIUM (PORCINE) 1000 UNIT/ML IJ SOLN
INTRAMUSCULAR | Status: DC | PRN
  Administered 2013-07-21: 1000 [IU]

## 2013-07-21 MED ORDER — PROPOFOL 10 MG/ML IV BOLUS
INTRAVENOUS | Status: DC | PRN
  Administered 2013-07-21: 150 mg via INTRAVENOUS

## 2013-07-21 MED ORDER — OXYCODONE HCL 5 MG PO TABS
5.0000 mg | ORAL_TABLET | ORAL | Status: DC | PRN
  Administered 2013-07-21: 5 mg via ORAL
  Filled 2013-07-21: qty 1

## 2013-07-21 MED ORDER — PROMETHAZINE HCL 25 MG/ML IJ SOLN
INTRAMUSCULAR | Status: AC
  Filled 2013-07-21: qty 1

## 2013-07-21 MED ORDER — HYDROMORPHONE HCL PF 1 MG/ML IJ SOLN
0.2500 mg | INTRAMUSCULAR | Status: DC | PRN
  Administered 2013-07-21 (×4): 0.5 mg via INTRAVENOUS

## 2013-07-21 MED ORDER — BUPIVACAINE LIPOSOME 1.3 % IJ SUSP
20.0000 mL | Freq: Once | INTRAMUSCULAR | Status: DC
  Filled 2013-07-21: qty 20

## 2013-07-21 MED ORDER — PROMETHAZINE HCL 25 MG/ML IJ SOLN
6.2500 mg | INTRAMUSCULAR | Status: DC | PRN
  Administered 2013-07-21: 6.25 mg via INTRAVENOUS

## 2013-07-21 MED ORDER — HYDRALAZINE HCL 20 MG/ML IJ SOLN
2.0000 mg | Freq: Once | INTRAMUSCULAR | Status: AC
  Administered 2013-07-21: 2 mg via INTRAVENOUS

## 2013-07-21 MED ORDER — FENTANYL CITRATE 0.05 MG/ML IJ SOLN
INTRAMUSCULAR | Status: DC | PRN
  Administered 2013-07-21 (×2): 50 ug via INTRAVENOUS
  Administered 2013-07-21 (×2): 100 ug via INTRAVENOUS

## 2013-07-21 MED ORDER — CIPROFLOXACIN IN D5W 400 MG/200ML IV SOLN
400.0000 mg | INTRAVENOUS | Status: AC
  Administered 2013-07-21: 400 mg via INTRAVENOUS

## 2013-07-21 SURGICAL SUPPLY — 47 items
ATTRACTOMAT 16X20 MAGNETIC DRP (DRAPES) ×3 IMPLANT
BAG URINE DRAINAGE (UROLOGICAL SUPPLIES) ×3 IMPLANT
BLADE EXTENDED COATED 6.5IN (ELECTRODE) ×3 IMPLANT
CANISTER SUCTION 2500CC (MISCELLANEOUS) ×3 IMPLANT
CHLORAPREP W/TINT 26ML (MISCELLANEOUS) ×3 IMPLANT
CLIP TI MEDIUM LARGE 6 (CLIP) ×13 IMPLANT
CLOTH BEACON ORANGE TIMEOUT ST (SAFETY) ×3 IMPLANT
CONT SPEC 4OZ CLIKSEAL STRL BL (MISCELLANEOUS) ×3 IMPLANT
COVER SURGICAL LIGHT HANDLE (MISCELLANEOUS) ×3 IMPLANT
DRAPE UTILITY 15X26 (DRAPE) ×3 IMPLANT
DRAPE UTILITY XL STRL (DRAPES) ×3 IMPLANT
DRAPE WARM FLUID 44X44 (DRAPE) ×3 IMPLANT
DRSG TELFA 4X10 ISLAND STR (GAUZE/BANDAGES/DRESSINGS) ×1 IMPLANT
ELECT BLADE 6.5 EXT (BLADE) ×3 IMPLANT
ELECT REM PT RETURN 9FT ADLT (ELECTROSURGICAL) ×3
ELECTRODE REM PT RTRN 9FT ADLT (ELECTROSURGICAL) ×2 IMPLANT
GAUZE SPONGE 4X4 16PLY XRAY LF (GAUZE/BANDAGES/DRESSINGS) ×3 IMPLANT
GLOVE BIO SURGEON STRL SZ 6.5 (GLOVE) ×3 IMPLANT
GLOVE BIOGEL M STRL SZ7.5 (GLOVE) ×15 IMPLANT
GOWN PREVENTION PLUS LG XLONG (DISPOSABLE) ×3 IMPLANT
GOWN STRL REIN XL XLG (GOWN DISPOSABLE) ×3 IMPLANT
KIT BASIN OR (CUSTOM PROCEDURE TRAY) ×3 IMPLANT
LIGASURE IMPACT 36 18CM CVD LR (INSTRUMENTS) IMPLANT
NS IRRIG 1000ML POUR BTL (IV SOLUTION) ×10 IMPLANT
PACK GENERAL/GYN (CUSTOM PROCEDURE TRAY) ×3 IMPLANT
SHEET LAVH (DRAPES) ×3 IMPLANT
SPONGE GAUZE 4X4 12PLY (GAUZE/BANDAGES/DRESSINGS) ×3 IMPLANT
SPONGE LAP 18X18 X RAY DECT (DISPOSABLE) ×6 IMPLANT
STAPLER SKIN PROX WIDE 3.9 (STAPLE) ×3 IMPLANT
STAPLER VISISTAT 35W (STAPLE) ×3 IMPLANT
SUT ETHILON 1 LR 30 (SUTURE) IMPLANT
SUT PDS AB 1 CTXB1 36 (SUTURE) ×6 IMPLANT
SUT SILK 2 0 (SUTURE)
SUT SILK 2 0 30  PSL (SUTURE)
SUT SILK 2 0 30 PSL (SUTURE) IMPLANT
SUT SILK 2-0 18XBRD TIE 12 (SUTURE) ×2 IMPLANT
SUT VIC AB 0 CT1 36 (SUTURE) ×11 IMPLANT
SUT VIC AB 2-0 CT2 27 (SUTURE) ×24 IMPLANT
SUT VIC AB 2-0 SH 27 (SUTURE) ×6
SUT VIC AB 2-0 SH 27X BRD (SUTURE) ×12 IMPLANT
SUT VIC AB 3-0 CTX 36 (SUTURE) IMPLANT
SUT VICRYL 2 0 18  UND BR (SUTURE) ×1
SUT VICRYL 2 0 18 UND BR (SUTURE) ×2 IMPLANT
TOWEL OR 17X26 10 PK STRL BLUE (TOWEL DISPOSABLE) ×3 IMPLANT
TOWEL OR NON WOVEN STRL DISP B (DISPOSABLE) ×3 IMPLANT
TRAY FOLEY CATH 14FRSI W/METER (CATHETERS) ×3 IMPLANT
WATER STERILE IRR 1500ML POUR (IV SOLUTION) ×3 IMPLANT

## 2013-07-21 NOTE — Anesthesia Postprocedure Evaluation (Signed)
  Anesthesia Post-op Note  Patient: Stephanie [name]  Procedure(s) Performed: Procedure(s) (LRB): EXPLORATORY LAPAROTOMY/ PELVIC LYMPHADENECTOMY    (N/A) TOTAL HYSTERECTOMY ABDOMINAL  (N/A) BILATERAL SALPINGO OOPHORECTOMY (Bilateral)  Patient Location: PACU  Anesthesia Type: General  Level of Consciousness: awake and alert   Airway and Oxygen Therapy: Patient Spontanous Breathing  Post-op Pain: mild  Post-op Assessment: Post-op Vital signs reviewed, Patient's Cardiovascular Status Stable, Respiratory Function Stable, Patent Airway and No signs of Nausea or vomiting  Last Vitals:  Filed Vitals:   07/21/13 1115  BP: 160/64  Pulse: 57  Temp: 36.6 C  Resp: 16    Post-op Vital Signs: stable   Complications: No apparent anesthesia complications. Hydralazine brought BP back to her normal.

## 2013-07-21 NOTE — Op Note (Signed)
Stephanie [name]  female MEDICAL RECORD AV:[id number] DATE OF BIRTH: 03/05/1946 PHYSICIAN: De Blanch, M.D  07/21/2013   OPERATIVE REPORT  PREOPERATIVE DIAGNOSIS: Grade 1 endometrial carcinoma. Uterine fibroids  POSTOPERATIVE DIAGNOSIS: Same  PROCEDURE: Total abdominal hysterectomy, bilateral salpingo-oophorectomy, pelvic lymphadenectomy  SURGEON: De Blanch, M.D ASSISTANT: Antionette Char M.D. ANESTHESIA: Gen. with oral tracheal tube ESTIMATED BLOOD LOSS: 100 mL  SURGICAL FINDINGS: At the time of exploratory laparotomy the uterus was approximately 16-18 weeks size with multiple subserosal fibroids. Tubes and ovaries were normal. On frozen section patient had endometrial carcinoma invading only the inner half of the myometrium. Exploration the upper abdomen revealed no evidence of metastatic disease. There was no pelvic or para-aortic lymphadenopathy.    PROCEDURE: The patient was brought to the operating room and after satisfactory attainment of general anesthesia was placed in a modified lithotomy position in Biron stirrups. The anterior abdominal wall, perineum and vagina were prepped, Foley catheter was inserted, the patient was draped. A time-out was taken, SCDs were in place, prophylactic antibiotics were administered.  The abdomen was entered through a vertical incision. Peritoneal washings were taken and sent to cytopathology.   Bookwalter retractor was assembled and  the small bowel and sigmoid colon were elevated out of the pelvis thus exposing the uterus. The uterus was grasped with two long Kelly clamps.  The right round ligament was divided the retroperitoneal spaces opened. The iliac vessels and ureter were identified. The ovarian vessels were skeletonized clamped, cut, free tied and suture ligated. Similar procedures performed left side the pelvis. The bladder flap was advanced with sharp and blunt dissection. Uterine vessels were skeletonized then  clamped cut and suture ligated. In the paracervical and cardinal ligaments were clamped, cut and suture ligated. Vaginal angles were encountered, crossclamped and the vagina transected from its connection to the cervix. The uterus, cervix,tubes and ovaries were handed off the operative field. The vaginal angles were transfixed with 0 Vicryl the central portion of vagina closed with interrupted figure-of-eight sutures of 0 Vicryl. The pelvis was irrigated and hemostasis was ascertained.  The paravesical and pararectal spaces were opened and pelvic lymphadenectomy was performed excising all visible lymphatic tissue overlying the external iliac artery and vein, internal iliac artery and obturator fossa. Care was taken to avoid vascular injury or injury to the genitofemoral nerve or obturator nerve. Hemostasis achieved with cautery.  The retractor was taken down   The abdomen and pelvis were irrigated.  The anterior abdominal wall was closed in layers the first being a running mass closure using #1 PDS. Subcutaneous tissue was irrigated and hemostasis achieved with cautery. Experal (266 mg diluted in 40 ml saline)was injected into the subcutaneous layer. Skin was closed with skin staples. A dressing was applied.  Patient was awakened from anesthesia and taken to the recovery room in satisfactory condition. Sponge needle and instrument counts correct times two.   De Blanch, M.D

## 2013-07-21 NOTE — Interval H&P Note (Signed)
History and Physical Interval Note:  07/21/2013 7:05 AM  Stephanie [name]  has presented today for surgery, with the diagnosis of ENDOMETRIAL CANCER  The various methods of treatment have been discussed with the patient and family. After consideration of risks, benefits and other options for treatment, the patient has consented to  Procedure(s): EXPLORATORY LAPAROTOMY/POSSIBLE LYMPHECTOMY    (N/A) TOTAL HYSTERECTOMY ABDOMINAL  (N/A) BILATERAL SALPINGO OOPHORECTOMY (Bilateral) as a surgical intervention .  The patient's history has been reviewed, patient examined, no change in status, stable for surgery.  I have reviewed the patient's chart and labs.  Questions were answered to the patient's satisfaction.     CLARKE-PEARSON,Justyn Langham L

## 2013-07-21 NOTE — H&P (View-Only) (Signed)
Consult Note: Gyn-Onc  Stephanie [name] 67 y.o. female  CC:  Chief Complaint  Patient presents with  . Endometrial cancer    New Consult    HPI: Patient is seen today in consultation at the request of Dr. Scheryl Darter.  Patient is a 67 year old gravida 2 para 1 who has had fibroids for many years. She states she started bleeding as a young lady and never stopped. She states about 2 years ago she began passing "fibroids and blood clots" and that she's able to tell the difference. She states that her generation is of the impression that after menopause surgeries unnecessary and 5 was within smaller. She started having increased bleeding last summer it wasn't until she was scheduled for glaucoma surgery in June of 2013 and was noted that she had a hemoglobin of 7 to this issue to address. She started having increased bleeding Mr. to have hemoglobin of 7 and was brought into the hospital for transfusion. She's had no additional care for her fibroids until June of this year. She was feeling weak and "faint" climbing the stairs to her bedroom. She was seen in the emergency room and had a hemoglobin of 3. At that time she was passing "fibroids". She was admitted to the hospital and transfused. She states that workup was otherwise negative except for the uterine fibroids.  While she was in the hospital on June 28 she underwent a complete pelvic ultrasound. The uterus is enlarged measuring 14.3 x 8.4 x 10.2 cm. It was enlarged and lobular with a number fibroids ranging in size up to 4.6 x 4.2 cm in size. The endometrium was heterogeneous appeared thickened up to 4.5 cm. The adnexa are not identified. She did not have any followup with a thickened endometrium and so again she presented to the emergency room with increased bleeding. She was then ultimately referred to Dr. Debroah Loop and had an endometrial biopsy on August 18 that revealed a grade 1 endometrioid adenocarcinoma.  The patient states that she can take  iron tablets and has in the past however, she experiences severe constipation and needs to drink a bottle of prune juice to have a bowel movement. Stool softeners do not help her. She states that with IV iron she had a drop in her blood pressure and she required epinephrine. She was recently in the emergency room again with increased bleeding and had a hemoglobin of 9. She was started on Provera and took this for a time and this decreased to bleeding but she never got the refills prescribed.  She's never had a colonoscopy. She's not had a mammogram since early 24s.  Review of Systems  Constitutional: Denies fever. Skin: No rash, sores, jaundice, itching, or dryness.  Cardiovascular: No chest pain, shortness of breath, or edema  Pulmonary: No cough or wheeze.  Gastro Intestinal: No nausea, vomiting, constipation, or diarrhea reported. No bright red blood per rectum or change in bowel movement.  Genitourinary: No frequency, urgency, or dysuria.  Denies vaginal bleeding and discharge.  Musculoskeletal: No myalgia, arthralgia, joint swelling or pain.  Neurologic: No weakness, numbness, or change in gait. Visually impaired due to glaucoma Psychology: Very anxious and nervous today and very concerned about not waking up from surgery.   Current Meds:  Outpatient Encounter Prescriptions as of 07/09/2013  Medication Sig Dispense Refill  . ALPRAZolam (XANAX) 0.5 MG tablet Take 1 tablet (0.5 mg total) by mouth 2 (two) times daily as needed for sleep or anxiety.  30 tablet  0  . bimatoprost (LUMIGAN) 0.03 % ophthalmic solution Place 1 drop into both eyes at bedtime.      . brimonidine-timolol (COMBIGAN) 0.2-0.5 % ophthalmic solution Place 1 drop into both eyes every 12 (twelve) hours.      . Dextromethorphan Polistirex (DELSYM PO) Take 10 mLs by mouth 2 (two) times daily as needed (for cough).       . Ferrous Sulfate (IRON) 325 (65 FE) MG TABS Take 1 tablet by mouth 2 (two) times daily.  30 each  2  .  hydrochlorothiazide (HYDRODIURIL) 25 MG tablet Take 1 tablet (25 mg total) by mouth daily.  30 tablet  3  . HYDROcodone-acetaminophen (NORCO/VICODIN) 5-325 MG per tablet Take 1 tablet by mouth every 6 (six) hours as needed for pain.  12 tablet  0  . lisinopril (PRINIVIL,ZESTRIL) 10 MG tablet Take 1 tablet (10 mg total) by mouth daily.  30 tablet  3  . medroxyPROGESTERone (PROVERA) 10 MG tablet Take 1 tablet (10 mg total) by mouth daily.  20 tablet  2  . [DISCONTINUED] medroxyPROGESTERone (PROVERA) 10 MG tablet Take 1 tablet (10 mg total) by mouth daily.  12 tablet  2   No facility-administered encounter medications on file as of 07/09/2013.    Allergy:  Allergies  Allergen Reactions  . Iron Anaphylaxis    IV Iron  . Penicillins Hives and Swelling    PT, STATED ALLERGIES TO ALL " CILLINS" Tongue swelling  . Diamox [Acetazolamide] Nausea And Vomiting    Severe nausea and vomitting    Social Hx:   History   Social History  . Marital Status: Divorced    Spouse Name: N/A    Number of Children: N/A  . Years of Education: N/A   Occupational History  . Not on file.   Social History Main Topics  . Smoking status: Never Smoker   . Smokeless tobacco: Never Used  . Alcohol Use: No  . Drug Use: No  . Sexual Activity: Not Currently    Birth Control/ Protection: None   Other Topics Concern  . Not on file   Social History Narrative  . No narrative on file    Past Surgical Hx: History reviewed. No pertinent past surgical history.  Past Medical Hx:  Past Medical History  Diagnosis Date  . Glaucoma   . Anxiety   . PNA (pneumonia)   . Cataract     Oncology Hx:    Endometrial cancer   06/01/2013 Initial Diagnosis Endometrial cancer    Surgery planned 107/2014 with Dr. Stanford Breed    Family Hx: Her mother had liver cancer that metastasized her lungs and brain. She was a 3 pack a day smoker Family History  Problem Relation Age of Onset  . Hypertension Father   . Vision  loss Maternal Grandmother     Vitals:  Blood pressure 162/100, pulse 134, temperature 99.7 F (37.6 C), temperature source Oral, resp. rate 16, height 5' 5.95" (1.675 m), weight 139 lb 8 oz (63.277 kg).  Physical Exam: Then, well-nourished, well-developed female in no acute distress.  Neck: Supple, no lymphadenopathy no thyromegaly.  Lungs: Clear to auscultation bilaterally.  Cardiac: Tachycardic regular rhythm. There is no murmurs or rubs.  Abdomen: Soft, nontender, nondistended. Palpable mass in the lower quadrant extending towards the right side. There is no rebound or guarding. There is no hepatosplenomegaly. There is no ascites.  Groins: No lymphadenopathy.  Extremities: No edema  Pelvic: Normal external female genitalia. Vagina is atrophic the  cervix is visualized. There was blood filling the posterior aspect of the vagina. The cervix is without lesions. Bimanual examination reveals the uterus to be approximately 16 weeks size. The uterus is overall is globular approximately 14 weeks size it appears that there's a pedunculated 5 cm fibroid off towards the patient's right. The parametria are free.  Assessment/Plan:  67 year old with fibroid uterus and a long history of postmenopausal bleeding attributed by the patient to fibroids who has a clinical stage I grade 1 endometrioid adenocarcinoma. I have refilled her Provera she will pick up at the drugstore. We also will check a CBC and chemistries today.  She and I discussed surgery at some length. She knows that we will need to proceed with a total abdominal history to me bilateral salpingo-oophorectomy and selective lymphadenectomy pending depth of invasion. This would need to be done abdominally as the uterus is still enlarged. She is tentatively scheduled for surgery on October 7 with Dr. Stanford Breed. She understands that she'll have the opportunity to meet with him prior to surgery and ask questions. She wishes to ensure that we  will notify him of her fears regarding anesthesia.  We discussed anesthesia and I told her that she statistically has a higher risk of dying driving home from her appointment today that she does regarding anesthesia. She was reassured to hear this. For surgery including but not limited to bleeding, injury to surround organs, infection, thromboembolic disease were discussed with the patient. We did discuss that she'll need to go home on postoperative Lovenox for approximately 4 weeks. The patient states her some would not be able to give for these injections that she'll be able to learn to give herself and we'll need to accomplish this in the hospital. As stated above we'll check her CBC today. She significantly anemic to be brought in next week for blood transfusion. Alternatively she could also be premedicated on October 6, transfused, and set up for surgery the following Tuesday. Her questions were elicited and answered her satisfaction. She states she's feeling much better after her appointment today and feels that her heart rate is dropping in a positive way. She was very appreciative of her visit with her to followup with me and she feels comfortable with me in the postoperative setting. Discuss with her that this would not be an issue.  She is discharged from the hospital, her son lives with her we'll be able to assist her. Nisha Dhami A., MD 07/09/2013, 11:43 AM

## 2013-07-21 NOTE — Progress Notes (Signed)
Utilization review completed.  

## 2013-07-21 NOTE — Progress Notes (Signed)
BP elevated; Dr. Council Mechanic notified, order rec'd and med given

## 2013-07-21 NOTE — Transfer of Care (Signed)
Immediate Anesthesia Transfer of Care Note  Patient: Stephanie [name]  Procedure(s) Performed: Procedure(s): EXPLORATORY LAPAROTOMY/ PELVIC LYMPHADENECTOMY    (N/A) TOTAL HYSTERECTOMY ABDOMINAL  (N/A) BILATERAL SALPINGO OOPHORECTOMY (Bilateral)  Patient Location: PACU  Anesthesia Type:General  Level of Consciousness: awake, sedated and patient cooperative  Airway & Oxygen Therapy: Patient Spontanous Breathing and Patient connected to face mask oxygen  Post-op Assessment: Report given to PACU RN and Post -op Vital signs reviewed and stable  Post vital signs: Reviewed and stable  Complications: No apparent anesthesia complications

## 2013-07-21 NOTE — Preoperative (Signed)
Beta Blockers   Reason not to administer Beta Blockers:Not Applicable 

## 2013-07-22 ENCOUNTER — Encounter (HOSPITAL_COMMUNITY): Payer: Self-pay | Admitting: Gynecology

## 2013-07-22 LAB — CBC
HCT: 30.6 % — ABNORMAL LOW (ref 36.0–46.0)
Hemoglobin: 9.5 g/dL — ABNORMAL LOW (ref 12.0–15.0)
MCH: 24.4 pg — ABNORMAL LOW (ref 26.0–34.0)
MCHC: 31 g/dL (ref 30.0–36.0)
MCV: 78.7 fL (ref 78.0–100.0)

## 2013-07-22 LAB — BASIC METABOLIC PANEL
BUN: 8 mg/dL (ref 6–23)
CO2: 24 mEq/L (ref 19–32)
Chloride: 99 mEq/L (ref 96–112)
GFR calc Af Amer: >90 mL/min (ref >90)
GFR calc non Af Amer: 89 mL/min — ABNORMAL LOW (ref >90)
Glucose, Bld: 144 mg/dL — ABNORMAL HIGH (ref 70–99)
Potassium: 3.3 mEq/L — ABNORMAL LOW (ref 3.5–5.1)

## 2013-07-22 MED ORDER — PROMETHAZINE HCL 25 MG/ML IJ SOLN
6.2500 mg | Freq: Four times a day (QID) | INTRAMUSCULAR | Status: DC | PRN
  Administered 2013-07-22: 6.25 mg via INTRAVENOUS
  Administered 2013-07-22: 12.5 mg via INTRAVENOUS
  Filled 2013-07-22 (×2): qty 1

## 2013-07-22 MED ORDER — BOOST PLUS PO LIQD
237.0000 mL | Freq: Three times a day (TID) | ORAL | Status: DC
  Filled 2013-07-22 (×6): qty 237

## 2013-07-22 NOTE — Care Management Note (Signed)
    Page 1 of 1   07/22/2013     10:04:12 AM   CARE MANAGEMENT NOTE 07/22/2013  Patient:  Stephanie [name]   Account Number:  [id number]  Date Initiated:  07/22/2013  Documentation initiated by:  Lorenda Ishihara  Subjective/Objective Assessment:   67 yo female admitted [name]/p TAH, BSO, nodes. PTA lived at home with son.     Action/Plan:   Home when stable   Anticipated DC Date:  07/24/2013   Anticipated DC Plan:        DC Planning Services  CM consult      Choice offered to / List presented to:             Status of service:  Completed, signed off Medicare Important Message given?   (If response is "NO", the following Medicare IM given date fields will be blank) Date Medicare IM given:   Date Additional Medicare IM given:    Discharge Disposition:  HOME/SELF CARE  Per UR Regulation:  Reviewed for med. necessity/level of care/duration of stay  If discussed at Long Length of Stay Meetings, dates discussed:    Comments:

## 2013-07-22 NOTE — Progress Notes (Signed)
1 Day Post-Op Procedure(s) (LRB): EXPLORATORY LAPAROTOMY/ PELVIC LYMPHADENECTOMY    (N/A) TOTAL HYSTERECTOMY ABDOMINAL  (N/A) BILATERAL SALPINGO OOPHORECTOMY (Bilateral)  Subjective: Patient reports becoming nauseated after eating a solid breakfast.  "I think I overdid it."  Ambulating with assistance without difficulty.  No emesis reported.  Minimal pain reported with adequate pain relief with PRN medication use.  Denies chest pain, dyspnea, passing flatus, or having a bowel movement.   No concerns voiced.  Objective: Vital signs in last 24 hours: Temp:  [97.5 F (36.4 C)-98.9 F (37.2 C)] 98.9 F (37.2 C) (10/08 0558) Pulse Rate:  [57-76] 76 (10/08 0558) Resp:  [10-18] 18 (10/08 0558) BP: (136-201)/(57-109) 144/79 mmHg (10/08 0558) SpO2:  [100 %] 100 % (10/08 0558) Last BM Date: 07/20/13  Intake/Output from previous day: 10/07 0701 - 10/08 0700 In: 5897.9 [P.O.:1200; I.V.:4697.9] Out: 3650 [Urine:3600; Blood:50]  Physical Examination: General: alert, cooperative and no distress Resp: clear to auscultation bilaterally Cardio: regular rate and rhythm, S1, S2 normal, no murmur, click, rub or gallop GI: soft, non-tender; bowel sounds normal; no masses,  no organomegaly and incision: midline incision dressing removed, staples midline with no drainage or erythema noted Extremities: extremities normal, atraumatic, no cyanosis or edema  Labs: WBC/Hgb/Hct/Plts:  14.5/9.5/30.6/279 (10/08 0524) BUN/Cr/glu/ALT/AST/amyl/lip:  8/0.69/--/--/--/--/-- (10/08 0524)  Assessment: 67 y.o. s/p Procedure(s): EXPLORATORY LAPAROTOMY/ PELVIC LYMPHADENECTOMY    TOTAL HYSTERECTOMY ABDOMINAL  BILATERAL SALPINGO OOPHORECTOMY: stable Pain:  Pain is well-controlled on PRN medications.  Heme:  Hgb 9.5 and Hct 30.6 this am.  Stable post-operatively.  Hx anemia pre-op.  CV:  Hx of hypertension.  BP and HR stable post-operatively.  Current treatment:  lisinopril (Prinivil).  GI:  Tolerating po:   Nauseated after breakfast this am.  FEN:  Stable post-operatively.  Prophylaxis: pharmacologic prophylaxis (with any of the following: enoxaparin (Lovenox) 40mg  SQ 2 hours prior to surgery then every day) and intermittent pneumatic compression boots.  Plan: Saline lock when nausea resolves Encourage ambulation, IS use, deep breathing, and coughing Continue post-operative plan of care per Dr. Tamela Oddi   LOS: 1 day    CROSS, MELISSA DEAL 07/22/2013, 8:43 AM

## 2013-07-23 ENCOUNTER — Inpatient Hospital Stay (HOSPITAL_COMMUNITY): Payer: Medicare Other

## 2013-07-23 LAB — CBC
HCT: 34.2 % — ABNORMAL LOW (ref 36.0–46.0)
MCV: 81.2 fL (ref 78.0–100.0)
Platelets: 239 10*3/uL (ref 150–400)
RBC: 4.21 MIL/uL (ref 3.87–5.11)
WBC: 17.4 10*3/uL — ABNORMAL HIGH (ref 4.0–10.5)

## 2013-07-23 LAB — HEPATIC FUNCTION PANEL
ALT: 6 U/L (ref 0–35)
AST: 17 U/L (ref 0–37)
Albumin: 2.8 g/dL — ABNORMAL LOW (ref 3.5–5.2)
Alkaline Phosphatase: 49 U/L (ref 39–117)
Bilirubin, Direct: <0.1 mg/dL (ref 0.0–0.3)
Total Bilirubin: 0.2 mg/dL — ABNORMAL LOW (ref 0.3–1.2)

## 2013-07-23 LAB — BASIC METABOLIC PANEL
CO2: 25 mEq/L (ref 19–32)
Calcium: 8.3 mg/dL — ABNORMAL LOW (ref 8.4–10.5)
Chloride: 102 mEq/L (ref 96–112)
Sodium: 137 mEq/L (ref 135–145)

## 2013-07-23 MED ORDER — IBUPROFEN 600 MG PO TABS
600.0000 mg | ORAL_TABLET | Freq: Four times a day (QID) | ORAL | Status: DC
  Administered 2013-07-23 – 2013-07-25 (×7): 600 mg via ORAL
  Filled 2013-07-23 (×12): qty 1

## 2013-07-23 MED ORDER — HYDROCHLOROTHIAZIDE 25 MG PO TABS
25.0000 mg | ORAL_TABLET | Freq: Every day | ORAL | Status: DC
  Administered 2013-07-23 – 2013-07-25 (×3): 25 mg via ORAL
  Filled 2013-07-23 (×4): qty 1

## 2013-07-23 MED ORDER — BISACODYL 10 MG RE SUPP
10.0000 mg | Freq: Once | RECTAL | Status: AC
  Administered 2013-07-23: 10 mg via RECTAL
  Filled 2013-07-23: qty 1

## 2013-07-23 MED ORDER — PANTOPRAZOLE SODIUM 40 MG IV SOLR
40.0000 mg | INTRAVENOUS | Status: DC
  Administered 2013-07-23 – 2013-07-25 (×3): 40 mg via INTRAVENOUS
  Filled 2013-07-23 (×4): qty 40

## 2013-07-23 MED ORDER — PROCHLORPERAZINE EDISYLATE 5 MG/ML IJ SOLN
5.0000 mg | Freq: Four times a day (QID) | INTRAMUSCULAR | Status: DC | PRN
  Administered 2013-07-23 – 2013-07-24 (×2): 5 mg via INTRAVENOUS
  Filled 2013-07-23 (×3): qty 2

## 2013-07-23 NOTE — Progress Notes (Signed)
2 Days Post-Op Procedure(s) (LRB): EXPLORATORY LAPAROTOMY/ PELVIC LYMPHADENECTOMY    (N/A) TOTAL HYSTERECTOMY ABDOMINAL  (N/A) BILATERAL SALPINGO OOPHORECTOMY (Bilateral)  Subjective: Patient reports continued nausea with eating solid food.  Able to tolerate liquids.  Reporting that she became moderately confused last pm, thinking she was at home in her room.  She states that her son was called and she feels embarrassed about her actions last evening.  She feels that the nausea medication contributed to her confusion.  She has been burping but no flatus or bowel movements post-op.  Ambulating with assistance without difficulty.  No emesis reported.  No pain reported.  Denies chest pain, dyspnea, passing flatus, or having a bowel movement.   No other concerns voiced.  Objective: Vital signs in last 24 hours: Temp:  [98.9 F (37.2 C)-99.5 F (37.5 C)] 98.9 F (37.2 C) (10/09 1041) Pulse Rate:  [80-107] 100 (10/09 1041) Resp:  [16-18] 18 (10/09 1041) BP: (143-172)/(82-93) 158/86 mmHg (10/09 1041) SpO2:  [98 %-100 %] 99 % (10/09 1041) Last BM Date: 07/21/13  Intake/Output from previous day: 10/08 0701 - 10/09 0700 In: 740 [P.O.:600; I.V.:140] Out: 1175 [Urine:1175]  Physical Examination: General: alert, cooperative and no distress Resp: clear to auscultation bilaterally Cardio: regular rate and rhythm, S1, S2 normal, no murmur, click, rub or gallop GI: incision: staples midline with no drainage or erythema noted and abdomen soft, hypoactive bowel sounds, non-tender, mildy tympanic Extremities: extremities normal, atraumatic, no cyanosis or edema  Labs:   BUN/Cr/glu/ALT/AST/amyl/lip:  7/0.75/--/--/--/--/-- (10/09 0440)  Assessment: 67 y.o. s/p Procedure(s): EXPLORATORY LAPAROTOMY/ PELVIC LYMPHADENECTOMY    TOTAL HYSTERECTOMY ABDOMINAL  BILATERAL SALPINGO OOPHORECTOMY: stable Pain:  Pain is well-controlled on PRN medications.  Heme:  Hgb 9.5 and Hct 30.6 07/22/13 am.  Stable  post-operatively.  Hx anemia pre-op.  CV:  Hx of hypertension.  BP and HR stable post-operatively.  Current treatment:  lisinopril (Prinivil).  GI:  Tolerating po:  Nauseated with solid food intake.  FEN:  Stable post-operatively.  Prophylaxis: pharmacologic prophylaxis (with any of the following: enoxaparin (Lovenox) 40mg  SQ 2 hours prior to surgery then every day) and intermittent pneumatic compression boots.  Plan: Dulcolax suppository this am Increase IVF to 125 cc/hr Sips of clear liquids Protonix IV daily Remove foley in the am Resume HCTZ Discontinue phenergan.  Compazine IV as needed for nausea Add CBC and Cmet to labs drawn today CBC and Cmet in the am Encourage ambulation, IS use, deep breathing, and coughing Continue post-operative plan of care per Dr. Tamela Oddi   LOS: 2 days    Stephanie [name] 07/23/2013, 10:45 AM

## 2013-07-24 LAB — COMPREHENSIVE METABOLIC PANEL
ALT: 5 U/L (ref 0–35)
Albumin: 2.6 g/dL — ABNORMAL LOW (ref 3.5–5.2)
Alkaline Phosphatase: 49 U/L (ref 39–117)
Calcium: 8.1 mg/dL — ABNORMAL LOW (ref 8.4–10.5)
Potassium: 4 mEq/L (ref 3.5–5.1)
Sodium: 132 mEq/L — ABNORMAL LOW (ref 135–145)
Total Protein: 5.8 g/dL — ABNORMAL LOW (ref 6.0–8.3)

## 2013-07-24 LAB — CBC
MCHC: 30.3 g/dL (ref 30.0–36.0)
Platelets: 261 10*3/uL (ref 150–400)
RDW: 20.7 % — ABNORMAL HIGH (ref 11.5–15.5)
WBC: 14.9 10*3/uL — ABNORMAL HIGH (ref 4.0–10.5)

## 2013-07-24 MED ORDER — SCOPOLAMINE 1 MG/3DAYS TD PT72
1.0000 | MEDICATED_PATCH | TRANSDERMAL | Status: DC
  Administered 2013-07-24: 1.5 mg via TRANSDERMAL
  Filled 2013-07-24: qty 1

## 2013-07-24 MED ORDER — KCL IN DEXTROSE-NACL 20-5-0.9 MEQ/L-%-% IV SOLN
INTRAVENOUS | Status: DC
  Administered 2013-07-24: 125 mL/h via INTRAVENOUS
  Administered 2013-07-24: 14:00:00 via INTRAVENOUS
  Administered 2013-07-25: 125 mL/h via INTRAVENOUS
  Filled 2013-07-24 (×5): qty 1000

## 2013-07-24 NOTE — Progress Notes (Signed)
3 Days Post-Op Procedure(s) (LRB): EXPLORATORY LAPAROTOMY/ PELVIC LYMPHADENECTOMY    (N/A) TOTAL HYSTERECTOMY ABDOMINAL  (N/A) BILATERAL SALPINGO OOPHORECTOMY (Bilateral)  Subjective: Patient reports episode of emesis last pm after returning from xray.  Mild nausea this am when rocking in the rocking chair but stating she has a history of motion sickness.  One episode of flatus this am when rocking.  Ambulating with assistance without difficulty.  No pain reported.  Denies chest pain, dyspnea, or having a bowel movement.   No other concerns voiced.  Objective: Vital signs in last 24 hours: Temp:  [98.4 F (36.9 C)-98.9 F (37.2 C)] 98.6 F (37 C) (10/10 0602) Pulse Rate:  [91-100] 91 (10/10 0602) Resp:  [18] 18 (10/10 0602) BP: (122-158)/(77-86) 122/79 mmHg (10/10 0602) SpO2:  [97 %-99 %] 97 % (10/10 0602) Last BM Date: 07/21/13  Intake/Output from previous day: 10/09 0701 - 10/10 0700 In: 880 [P.O.:480; I.V.:400] Out: 500 [Urine:500]  Physical Examination: General: alert, cooperative and no distress Resp: clear to auscultation bilaterally Cardio: regular rate and rhythm, S1, S2 normal, no murmur, click, rub or gallop GI: incision: staples midline with no drainage or erythema noted and abdomen soft, active bowel sounds, non-tender, mildy tympanic Extremities: extremities normal, atraumatic, no cyanosis or edema  Labs: WBC/Hgb/Hct/Plts:  14.9/9.8/32.3/261 (10/10 0441) BUN/Cr/glu/ALT/AST/amyl/lip:  15/1.04/--/5/12/--/-- (10/10 1610)  Assessment: 67 y.o. s/p Procedure(s): EXPLORATORY LAPAROTOMY/ PELVIC LYMPHADENECTOMY    TOTAL HYSTERECTOMY ABDOMINAL  BILATERAL SALPINGO OOPHORECTOMY: stable Pain:  Pain is well-controlled on PRN medications.  Heme:  Hgb 9.8 and Hct 32.3 this am.  Stable post-operatively.  Hx anemia pre-op.  CV:  Hx of hypertension.  BP and HR stable post-operatively.  Current treatment:  lisinopril (Prinivil) and HCTZ.  GI:  Tolerating po:  NPO.  FEN:   Stable post-operatively.  Prophylaxis: pharmacologic prophylaxis (with any of the following: enoxaparin (Lovenox) 40mg  SQ 2 hours prior to surgery then every day) and intermittent pneumatic compression boots.  Plan: Sips of clear liquids Compazine IV as needed for nausea.  Scopolamine patch for nausea CBC and Bmet in the am Encourage ambulation, IS use, deep breathing, and coughing Continue post-operative plan of care per Dr. Tamela Oddi   LOS: 3 days    Akina Maish DEAL 07/24/2013, 10:19 AM

## 2013-07-25 LAB — BASIC METABOLIC PANEL
BUN: 15 mg/dL (ref 6–23)
Calcium: 7.9 mg/dL — ABNORMAL LOW (ref 8.4–10.5)
Chloride: 106 mEq/L (ref 96–112)
Creatinine, Ser: 1.09 mg/dL (ref 0.50–1.10)
GFR calc Af Amer: 60 mL/min — ABNORMAL LOW (ref >90)
GFR calc non Af Amer: 52 mL/min — ABNORMAL LOW (ref >90)
Glucose, Bld: 97 mg/dL (ref 70–99)

## 2013-07-25 LAB — CBC
HCT: 29.8 % — ABNORMAL LOW (ref 36.0–46.0)
Hemoglobin: 8.9 g/dL — ABNORMAL LOW (ref 12.0–15.0)
MCHC: 29.9 g/dL — ABNORMAL LOW (ref 30.0–36.0)
MCV: 81.9 fL (ref 78.0–100.0)
RDW: 20.7 % — ABNORMAL HIGH (ref 11.5–15.5)
WBC: 7.5 10*3/uL (ref 4.0–10.5)

## 2013-07-25 LAB — URINE CULTURE: Special Requests: NORMAL

## 2013-07-25 MED ORDER — OXYCODONE-ACETAMINOPHEN 5-325 MG PO TABS: 1.0000 | ORAL_TABLET | ORAL | Status: DC | PRN

## 2013-07-25 MED ORDER — ENOXAPARIN (LOVENOX) PATIENT EDUCATION KIT
PACK | Freq: Once | Status: DC
  Filled 2013-07-25: qty 1

## 2013-07-25 MED ORDER — STERILE WATER FOR INJECTION IJ SOLN
INTRAMUSCULAR | Status: AC
  Filled 2013-07-25: qty 20

## 2013-07-25 MED ORDER — ENOXAPARIN SODIUM 40 MG/0.4ML ~~LOC~~ SOLN: 40.0000 mg | SUBCUTANEOUS | Status: DC

## 2013-07-25 NOTE — Discharge Summary (Signed)
Physician Discharge Summary  Patient ID: Stephanie [name] MRN: [id number] DOB/AGE: [birth year] 67 y.o.  Admit date: 07/21/2013 Discharge date: 07/25/2013  Admission Diagnoses: Endometrial cancer  Discharge Diagnoses:  Principal Problem:   Endometrial cancer   Discharged Condition: good  Hospital Course: On 07/21/2013, the patient underwent the following: Procedure(s): EXPLORATORY LAPAROTOMY/ PELVIC LYMPHADENECTOMY    TOTAL HYSTERECTOMY ABDOMINAL  BILATERAL SALPINGO OOPHORECTOMY.   The postoperative course was remarkable for a mild ileus that resolved with bowel rest.  She also had urinary retention.  The foley catheter was replaced.  She was able to void without difficulty prior to discharge.  She was discharged to home on postoperative day 4 tolerating a regular diet.  Consults: None  Significant Diagnostic Studies: labs: CBC/BMETand radiology: X-Ray: AXR  Treatments: surgery: see above   Discharge Exam: Blood pressure 141/85, pulse 89, temperature 98.1 F (36.7 C), temperature source Oral, resp. rate 18, height 5\' 6"  (1.676 m), weight 63.957 kg (141 lb), SpO2 99.00%. General appearance: alert GI: soft, non-tender; bowel sounds normal; no masses,  no organomegaly Extremities: extremities normal, atraumatic, no cyanosis or edema Incision/Wound:  C/D/I  Disposition: 01-Home or Self Care      Discharge Orders   Future Appointments Provider Department Dept Phone   08/03/2013 2:15 PM [facility] Covering Provider [facility] [phone]   Future Orders Complete By Expires   Call MD for:  extreme fatigue  As directed    Call MD for:  persistant dizziness or light-headedness  As directed    Call MD for:  persistant nausea and vomiting  As directed    Call MD for:  redness, tenderness, or signs of infection (pain, swelling, redness, odor or green/yellow discharge around incision site)  As directed    Call MD for:  severe uncontrolled pain  As  directed    Call MD for:  temperature >100.4  As directed    Diet - low sodium heart healthy  As directed    Discharge wound care:  As directed    Comments:     Keep clean and dry   Driving Restrictions  As directed    Comments:     No driving for  2 weeks   Increase activity slowly  As directed    Lifting restrictions  As directed    Comments:     No lifting > 5 lbs for 6 weeks   May shower / Bathe  As directed    Comments:     No tub baths for 6 weeks   May walk up steps  As directed    Sexual Activity Restrictions  As directed    Comments:     No intercourse for 6  weeks       Medication List    STOP taking these medications       acetaminophen 500 MG tablet  Commonly known as:  TYLENOL     medroxyPROGESTERone 10 MG tablet  Commonly known as:  PROVERA      TAKE these medications       ALPRAZolam 0.5 MG tablet  Commonly known as:  XANAX  Take 0.5 mg by mouth 2 (two) times daily as needed for sleep or anxiety.     bimatoprost 0.01 % Soln  Commonly known as:  LUMIGAN  Place 1 drop into both eyes at bedtime.     COMBIGAN 0.2-0.5 % ophthalmic solution  Generic drug:  brimonidine-timolol  Place 1 drop into both eyes every 12 (  twelve) hours.     enoxaparin 40 MG/0.4ML injection  Commonly known as:  LOVENOX  Inject 0.4 mLs (40 mg total) into the skin daily.     ferrous fumarate 325 (106 FE) MG Tabs tablet  Commonly known as:  HEMOCYTE - 106 mg FE  Take 1 tablet by mouth daily.     hydrochlorothiazide 25 MG tablet  Commonly known as:  HYDRODIURIL  Take 25 mg by mouth daily with lunch.     lisinopril 10 MG tablet  Commonly known as:  PRINIVIL,ZESTRIL  Take 10 mg by mouth daily with lunch.     oxyCODONE-acetaminophen 5-325 MG per tablet  Commonly known as:  ROXICET  Take 1 tablet by mouth every 4 (four) hours as needed for pain.       Follow-up Information   Follow up with CLARKE-PEARSON,DANIEL L, MD. Schedule an appointment as soon as possible for a  visit in 1 week.   Specialty:  Gynecology   Contact information:   [address] [place] [address] [phone]       Signed: Roseanna Rainbow 07/25/2013, 11:01 AM

## 2013-07-25 NOTE — Progress Notes (Signed)
PT TO GO HOME WITH LOVENOX. PT UNABLE TO INJECT HERSELF DUE TO HER BLINDNESS. INSTRUCTED PT'S SON ON HOW TO GIVE AN INJECTION IN THE "LOVE HANDLES" PART OF HER ABD.  PT' SON INJECTED NS IN PT'S ABD FOR PRACTICE. BOTH VOCALIZED UNDERSTANDING OF HOW TO GIVE THE LOVENOX INJECTION. GAVE PT TEACHING KIT AT DISCHARGE.

## 2013-07-27 ENCOUNTER — Telehealth: Payer: Self-pay | Admitting: Gynecologic Oncology

## 2013-07-27 NOTE — Telephone Encounter (Signed)
Post op telephone call to check patient status.  Patient describes expected post operative status.  Adequate PO intake reported.  Bowels and bladder functioning without difficulty.  Pain minimal.  Reportable signs and symptoms reviewed.  Follow up appt arranged for Wed for staple removal.

## 2013-07-29 ENCOUNTER — Encounter (INDEPENDENT_AMBULATORY_CARE_PROVIDER_SITE_OTHER): Payer: Self-pay

## 2013-07-29 ENCOUNTER — Ambulatory Visit: Payer: Medicare Other | Attending: Gynecologic Oncology | Admitting: Gynecologic Oncology

## 2013-07-29 ENCOUNTER — Encounter: Payer: Self-pay | Admitting: Gynecologic Oncology

## 2013-07-29 VITALS — BP 170/92 | HR 124 | Temp 98.9°F | Ht 65.95 in | Wt 137.0 lb

## 2013-07-29 DIAGNOSIS — C541 Malignant neoplasm of endometrium: Secondary | ICD-10-CM

## 2013-07-29 NOTE — Patient Instructions (Signed)
Follow up with your primary care provider as soon as possible about your elevated blood pressure.  Seek emergency medical attention for chest pain, shortness of breath, dizziness, lightheadedness, etc.  Please call for any questions or concerns.  The steri strips on your abdomen should fall off in one week.

## 2013-07-29 NOTE — Progress Notes (Signed)
Follow Up Note: Gyn-Onc  Stephanie [name] 67 y.o. female  CC:  Chief Complaint  Patient presents with  . Routine Post Op    Follow up    HPI:  Stephanie [name] is a 52 year old, gravida 2 para 1, who was referred by Dr. Scheryl Darter.  She has had fibroids for many years and states she started bleeding as a young lady and never stopped. She states about 2 years ago she began passing "fibroids and blood clots" and that she's able to tell the difference.  She started having increased bleeding last summer that was not addressed until her glaucoma surgery in June of 2013 where she was noted to have a hemoglobin of 7.  She was brought into the hospital for transfusion. She's had no additional care for her fibroids until June of this year. She was feeling weak and "faint" climbing the stairs to her bedroom. She was seen in the emergency room and had a hemoglobin of 3. At that time, she was passing "fibroids". She was admitted to the hospital and transfused. She states that workup was otherwise negative except for the uterine fibroids.  While she was in the hospital on June 28, she underwent a complete pelvic ultrasound. The uterus is enlarged measuring 14.3 x 8.4 x 10.2 cm. It was enlarged and lobular with a number fibroids ranging in size up to 4.6 x 4.2 cm in size. The endometrium was heterogeneous appeared thickened up to 4.5 cm. The adnexa are not identified.   She did not have any followup with a thickened endometrium and so again she presented to the emergency room with increased bleeding. She was then ultimately referred to Dr. Debroah Loop and had an endometrial biopsy on August 18 that revealed a grade 1 endometrioid adenocarcinoma.  She was started on Provera and took this for a time and this decreased the bleeding but she never got the refills prescribed.  She was seen by Dr. Cleda Mccreedy on 07/09/13 and underwent transfusion of 2 units PRBCs on 07/17/13 for a hemoglobin of 7.4 prior to surgery on 07/21/13.   On 07/21/13, she underwent a total abdominal hysterectomy, bilateral salpingo-oophorectomy, and pelvic lymphadenectomy by Dr. De Blanch.  Final pathology revealed invasive endometrioid carcinoma, grade 2, with squamous differentiation, confined within the inner half of the myometrium.  Leiomyomata and adenomyosis were noted along with benign cervix, benign bilateral ovaries and tubes, four benign lymph nodes from the left pelvic region, and three benign lymph nodes from the right pelvic region.  Peritoneal washings resulted reactive mesothelial cells and inflammation.  Her post-operative course was uneventful except for a mild post-operative ileus.     Interval History:  She presents today for post-operative follow up and staple removal.  She states that she has been doing well at home.  She reports that she feels her blood pressure will be high because she has not been following a heart healthy diet at home and she has only been eating fast food.  She has been keeping a log of her blood pressures and reports her pressure as 142/95 with pulse 88 at home this am.  She did take her lisinopril and HCTZ this am.  She states that she has not been drinking as much water as she had been in the past.  Bowels and bladder functioning without difficulty.  No pain reported.  She has been ambulating without difficulty.  She denies any redness or drainage from her incision.  She denies vaginal bleeding also.  She states that her son told her staple removal would be extremely painful, which has made her slightly anxious.  No concerns voiced.  She states she is to see Dr. Hyman Hopes, her primary care provider, on Monday October 20 for follow up and to receive the flu shot.    Review of Systems  Constitutional: Feels well.  No fever, chills, early satiety, lightheadedness, dizziness, or fatigue.  Cardiovascular: No chest pain, shortness of breath, or edema.  Pulmonary: No cough or wheeze.  Gastrointestinal: No  nausea, vomiting, or diarrhea. No bright red blood per rectum or change in bowel movement.  Genitourinary: No frequency, urgency, or dysuria. No vaginal bleeding or discharge.  Musculoskeletal: No myalgia or joint pain. Neurologic: No weakness, numbness, or change in gait.  Psychology: No depression, anxiety, or insomnia.  Current Meds:  Outpatient Encounter Prescriptions as of 07/29/2013  Medication Sig Dispense Refill  . ALPRAZolam (XANAX) 0.5 MG tablet Take 0.5 mg by mouth 2 (two) times daily as needed for sleep or anxiety.      . bimatoprost (LUMIGAN) 0.01 % SOLN Place 1 drop into both eyes at bedtime.      . brimonidine-timolol (COMBIGAN) 0.2-0.5 % ophthalmic solution Place 1 drop into both eyes every 12 (twelve) hours.      . enoxaparin (LOVENOX) 40 MG/0.4ML injection Inject 0.4 mLs (40 mg total) into the skin daily.  24 Syringe  0  . hydrochlorothiazide (HYDRODIURIL) 25 MG tablet Take 25 mg by mouth daily with lunch.      . lisinopril (PRINIVIL,ZESTRIL) 10 MG tablet Take 10 mg by mouth daily with lunch.      . ferrous fumarate (HEMOCYTE - 106 MG FE) 325 (106 FE) MG TABS tablet Take 1 tablet by mouth daily.      Marland Kitchen oxyCODONE-acetaminophen (ROXICET) 5-325 MG per tablet Take 1 tablet by mouth every 4 (four) hours as needed for pain.  30 tablet  0   No facility-administered encounter medications on file as of 07/29/2013.    Allergy:  Allergies  Allergen Reactions  . Iron Anaphylaxis    IV Iron  . Penicillins Hives and Swelling    PT, STATED ALLERGIES TO ALL " CILLINS" Tongue swelling  . Amoxicillin Other (See Comments)    Unknown reaction  . Ampicillin Other (See Comments)    Unknown reaction  . Diamox [Acetazolamide] Nausea And Vomiting    Severe nausea and vomitting    Social Hx:   History   Social History  . Marital Status: Divorced    Spouse Name: N/A    Number of Children: N/A  . Years of Education: N/A   Occupational History  . Not on file.   Social History  Main Topics  . Smoking status: Never Smoker   . Smokeless tobacco: Never Used  . Alcohol Use: No  . Drug Use: No  . Sexual Activity: Not Currently    Birth Control/ Protection: None   Other Topics Concern  . Not on file   Social History Narrative  . No narrative on file    Past Surgical Hx:  Past Surgical History  Procedure Laterality Date  . Dilation and curettage of uterus    . Laparotomy N/A 07/21/2013    Procedure: EXPLORATORY LAPAROTOMY/ PELVIC LYMPHADENECTOMY   ;  Surgeon: Jeannette Corpus, MD;  Location: [facility];  Service: Gynecology;  Laterality: N/A;  . Abdominal hysterectomy N/A 07/21/2013    Procedure: TOTAL HYSTERECTOMY ABDOMINAL ;  Surgeon: Jeannette Corpus, MD;  Location:  [facility];  Service: Gynecology;  Laterality: N/A;  . Salpingoophorectomy Bilateral 07/21/2013    Procedure: BILATERAL SALPINGO OOPHORECTOMY;  Surgeon: Jeannette Corpus, MD;  Location: [facility];  Service: Gynecology;  Laterality: Bilateral;    Past Medical Hx:  Past Medical History  Diagnosis Date  . Glaucoma   . Anxiety   . PNA (pneumonia)   . Cataract   . Complication of anesthesia   . PONV (postoperative nausea and vomiting)   . Hypertension   . Anemia   . Vaginal bleeding   . History of transfusion   . Fibroids   . Endometrial cancer   . Difficulty sleeping   . Blindness, legal     Family Hx:  Family History  Problem Relation Age of Onset  . Hypertension Father   . Vision loss Maternal Grandmother     Vitals:  Blood pressure 170/92, pulse 124, temperature 98.9 F (37.2 C), height 5' 5.95" (1.675 m), weight 137 lb (62.143 kg).  Initial BP with machine 198/132 with pulse 140.  Manual performed resulting 168/100 with pulse 110.  Repeat prior to pt leaving resulted 170/92 with pulse 124.  Patient asymptomatic.  Physical Exam:  General: Well developed, well nourished female in no acute distress. Alert and oriented x 3.  Glasses in place.  Ambulating with assist.   Visually impaired.  Neck: Supple without any enlargements.  Cardiovascular: Tachycardic at 108 bpm.  Regular rhythm. S1 and S2 normal.  No murmurs, clicks, or rubs.  Lungs: Clear to auscultation bilaterally. No wheezes/crackles/rhonchi noted.  Skin: No rashes or lesions present. Back: No CVA tenderness.  Abdomen: Abdomen soft, non-tender and obese. Active bowel sounds in all quadrants.  13 staples removed from the midline incision without difficulty.  1/2 inch steri strips applied.  No drainage or erythema noted.  Extremities: No bilateral cyanosis, edema, or clubbing.   Assessment/Plan:  Stephanie [name] is a 89 year old s/p total abdominal hysterectomy, bilateral salpingo-oophorectomy, and pelvic lymphadenectomy by Dr. Stanford Breed on 07/21/13 for Stage IA grade 2 endometrioid carcinoma.  She is doing well post-operatively except for elevated blood pressure and pulse.  She is advised to limit her salt intake and to try to make better selections for meals, incorporating fruits and vegetables. She is advised to increase her water intake to maintain adequate hydration.  She is to continue the lovenox injections daily until November 4 to complete a total of 28 days post operative therapy for DVT prophylaxis.  She is advised that Dr. Hyman Hopes will receive my note from today in case he would like for her to be seen sooner than Monday, October 20 or if her anti-hypertensive medications may need to be adjusted.  Reportable signs and symptoms reviewed including the risks of having high blood pressure and elevated pulse.  Follow up appointment arranged with Dr. Stanford Breed for November 19 or sooner if needed.  She is advised to call for any questions or concerns.    Stephanie Orner DEAL, NP 07/29/2013, 2:49 PM

## 2013-07-30 ENCOUNTER — Ambulatory Visit: Payer: Medicare Other | Admitting: Gynecologic Oncology

## 2013-08-03 ENCOUNTER — Ambulatory Visit: Payer: Medicare Other | Attending: Internal Medicine | Admitting: Internal Medicine

## 2013-08-03 VITALS — BP 156/101 | HR 144 | Temp 97.7°F | Resp 16

## 2013-08-03 DIAGNOSIS — I10 Essential (primary) hypertension: Secondary | ICD-10-CM

## 2013-08-03 DIAGNOSIS — Z23 Encounter for immunization: Secondary | ICD-10-CM

## 2013-08-03 DIAGNOSIS — C549 Malignant neoplasm of corpus uteri, unspecified: Secondary | ICD-10-CM

## 2013-08-03 DIAGNOSIS — C541 Malignant neoplasm of endometrium: Secondary | ICD-10-CM

## 2013-08-03 MED ORDER — LISINOPRIL-HYDROCHLOROTHIAZIDE 20-12.5 MG PO TABS: 2.0000 | ORAL_TABLET | Freq: Every day | ORAL | Status: DC

## 2013-08-03 NOTE — Progress Notes (Signed)
Patient ID: Stephanie [name], female   DOB: September 02, 1946, 67 y.o.   MRN: [id number] HPI   HPI 67 year old female with history of anxiety, hypertension, legally blind with a recent history of invasive endometrial carcinoma status post recent hysterectomy with bilateral salpingo-oophorectomy comes in for followup. She recently saw her gynecologist post discharge and had her staples removed. She denies any abdominal pain, fever, chills, vaginal discharge. Denies any bowel or urinary symptoms. Denies any change in her weight or appetite. Her blood pressure has been noted to be elevated today as well as during her recent visit to her gynecologist. She reports that her blood pressure is elevated when she comes to the clinic but at home it usually changes on 130/87mmhg. he has been monitoring her salt intake and her anxiety symptoms has been fairly stable at home. Denies any chest pain, palpitations, headache, dizziness, shortness of breath, abdominal pain, nausea or vomiting.  Review of systems As outlined above  Vital signs in last 24 hours:  Filed Vitals:   08/03/13 1522 08/03/13 1523  BP: 176/137 156/101  Pulse: 150 144  Temp: 97.7 F (36.5 C)   Resp: 16   SpO2: 100%     Physical Exam:  General: Elderly female in no acute distress. HEENT: Legally blind, no pallor, no icterus, moist oral mucosa,, no lymphadenopathy Heart: S1 and S2 tachycardic, without murmurs, rubs, gallops. Lungs: Clear to auscultation bilaterally. Abdomen: Clean dressing over midline, Soft, nontender, nondistended, positive bowel sounds. Extremities: Warm, no edema Neuro: Alert, awake, oriented x3, nonfocal.   Lab Results:  Basic Metabolic Panel:    Component Value Date/Time   NA 136 07/25/2013 0520   NA 142 07/09/2013 1144   K 4.3 07/25/2013 0520   K 3.7 07/09/2013 1144   CL 106 07/25/2013 0520   CO2 24 07/25/2013 0520   CO2 23 07/09/2013 1144   BUN 15 07/25/2013 0520   BUN 14.4 07/09/2013 1144   CREATININE 1.09  07/25/2013 0520   CREATININE 0.8 07/09/2013 1144   GLUCOSE 97 07/25/2013 0520   GLUCOSE 115 07/09/2013 1144   CALCIUM 7.9* 07/25/2013 0520   CALCIUM 9.3 07/09/2013 1144   CBC:    Component Value Date/Time   WBC 7.5 07/25/2013 0520   WBC 7.6 07/09/2013 1144   HGB 8.9* 07/25/2013 0520   HGB 7.4* 07/09/2013 1144   HCT 29.8* 07/25/2013 0520   HCT 24.2* 07/09/2013 1144   PLT 210 07/25/2013 0520   PLT 263 07/09/2013 1144   MCV 81.9 07/25/2013 0520   MCV 78.6* 07/09/2013 1144   NEUTROABS 4.7 07/17/2013 0940   NEUTROABS 5.7 07/09/2013 1144   LYMPHSABS 1.8 07/17/2013 0940   LYMPHSABS 1.2 07/09/2013 1144   MONOABS 0.5 07/17/2013 0940   MONOABS 0.5 07/09/2013 1144   EOSABS 0.1 07/17/2013 0940   EOSABS 0.1 07/09/2013 1144   BASOSABS 0.0 07/17/2013 0940   BASOSABS 0.1 07/09/2013 1144    No results found for this or any previous visit (from the past 240 hour(s)).  Studies/Results: No results found.  Medications: Scheduled Meds: Continuous Infusions: PRN Meds:.    Assessment/Plan:  Uncontrolled hypertension I will switch her blood pressure medication to lisinopril-HCTZ 40-25 mg 1 tablet daily .counseled on continuing salt restriction in diet. Patient should return to the clinic in one week to have her blood pressure checked and also bring her home blood pressure monitor to calibrate it. Followup in 3 months.  Endometrial carcinoma Status post total hysterectomy with bilateral salpingo-oophorectomy .Marland Kitchen Has followup with her GYN  next month. She is on daily Lovenox injection for DVT prophylaxis for another 2 weeks.  Anxiety Continue with Xanax  Health maintenance We'll order  flu vaccine today  Followup in 3 months  Thamar Holik 08/03/2013, 3:46 PM

## 2013-08-03 NOTE — Progress Notes (Signed)
Patient here for follow up Would like a flu shot and pneumonia

## 2013-08-10 ENCOUNTER — Ambulatory Visit: Payer: Medicare Other | Attending: Internal Medicine

## 2013-08-10 MED ORDER — ALPRAZOLAM 0.25 MG PO TABS: 0.5000 mg | ORAL_TABLET | Freq: Two times a day (BID) | ORAL | Status: DC

## 2013-08-10 MED ORDER — ALPRAZOLAM 0.25 MG PO TABS: 0.5000 mg | ORAL_TABLET | Freq: Two times a day (BID) | ORAL | Status: DC | PRN

## 2013-08-10 NOTE — Progress Notes (Unsigned)
Pt given d/c instructions to continue monitoring BP at home and return in 3 mnths for f/u. Script Xanax given

## 2013-08-10 NOTE — Progress Notes (Unsigned)
Pt here for BP recheck Monitoring at home with normal reading in the morning BP 173/123. States she takes bp meds early in there morning Denies blurry vision or CP Requesting refill Xanax

## 2013-09-02 ENCOUNTER — Encounter (INDEPENDENT_AMBULATORY_CARE_PROVIDER_SITE_OTHER): Payer: Self-pay

## 2013-09-02 ENCOUNTER — Ambulatory Visit: Payer: Medicare Other | Attending: Gynecology | Admitting: Gynecology

## 2013-09-02 ENCOUNTER — Encounter: Payer: Self-pay | Admitting: Gynecology

## 2013-09-02 VITALS — BP 198/110 | HR 124 | Temp 98.8°F | Resp 16 | Ht 66.0 in | Wt 137.3 lb

## 2013-09-02 DIAGNOSIS — I10 Essential (primary) hypertension: Secondary | ICD-10-CM | POA: Insufficient documentation

## 2013-09-02 DIAGNOSIS — H409 Unspecified glaucoma: Secondary | ICD-10-CM | POA: Insufficient documentation

## 2013-09-02 DIAGNOSIS — Z90710 Acquired absence of both cervix and uterus: Secondary | ICD-10-CM | POA: Insufficient documentation

## 2013-09-02 DIAGNOSIS — C541 Malignant neoplasm of endometrium: Secondary | ICD-10-CM

## 2013-09-02 DIAGNOSIS — Z9079 Acquired absence of other genital organ(s): Secondary | ICD-10-CM | POA: Insufficient documentation

## 2013-09-02 DIAGNOSIS — Z79899 Other long term (current) drug therapy: Secondary | ICD-10-CM | POA: Insufficient documentation

## 2013-09-02 DIAGNOSIS — C549 Malignant neoplasm of corpus uteri, unspecified: Secondary | ICD-10-CM | POA: Insufficient documentation

## 2013-09-02 NOTE — Patient Instructions (Signed)
Return to see me in 3 months. You may return to full levels of activity.

## 2013-09-02 NOTE — Progress Notes (Signed)
Consult Note: Gyn-Onc   Stephanie [name] 67 y.o. female  Chief Complaint  Patient presents with  . Endo ca    Assessment : Stage IA grade 2 endometrial carcinoma no evidence of disease. Excellent postoperative recovery.  Plan: The patient is given the okay return to full levels of activity. She returned to see me in 3 months for followup.  Interval History: Patient underwent TAH/BSO and pelvic lymphadenectomy 07/21/2013. She had large uterine fibroids and endometrial carcinoma (stage I a grade 2). Her postoperative course was uncomplicated. Since hospital discharge she's done well.  HPI: Patient was initially referred with a long history of abnormal uterine bleeding. She presumed is a secondary uterine fibroids. However she was found to have a grade 1 endometrial carcinoma in August 2014 and underwent TAH BSO pelvic lymphadenectomy. Final pathology showed a stage I a grade 2 endometrial carcinoma and no adjuvant therapy was recommended. Surgery was performed on 07/21/2013.  Review of Systems:10 point review of systems is negative except as noted in interval history.   Vitals: Blood pressure 198/110, pulse 124, temperature 98.8 F (37.1 C), temperature source Oral, resp. rate 16, height 5\' 6"  (1.676 m), weight 137 lb 4.8 oz (62.279 kg).  Physical Exam: General : The patient is a healthy woman in no acute distress.  HEENT: normocephalic, extraoccular movements normal; neck is supple without thyromegally  Lynphnodes: Supraclavicular and inguinal nodes not enlarged  Abdomen: Soft, non-tender, no ascites, no organomegally, no masses, no hernias , midline incision is healing well. Pelvic:  EGBUS: Normal female  Vagina: Normal, no lesions , vaginal cuff is well healed. Urethra and Bladder: Normal, non-tender  Cervix: Surgically absent  Uterus: Surgically absent  Bi-manual examination: Non-tender; no adenxal masses or nodularity  Rectal: normal sphincter tone, no masses, no blood  Lower  extremities: No edema or varicosities. Normal range of motion      Allergies  Allergen Reactions  . Iron Anaphylaxis    IV Iron  . Penicillins Hives and Swelling    PT, STATED ALLERGIES TO ALL " CILLINS" Tongue swelling  . Amoxicillin Other (See Comments)    Unknown reaction  . Ampicillin Other (See Comments)    Unknown reaction  . Diamox [Acetazolamide] Nausea And Vomiting    Severe nausea and vomitting    Past Medical History  Diagnosis Date  . Glaucoma   . Anxiety   . PNA (pneumonia)   . Cataract   . Complication of anesthesia   . PONV (postoperative nausea and vomiting)   . Hypertension   . Anemia   . Vaginal bleeding   . History of transfusion   . Fibroids   . Endometrial cancer   . Difficulty sleeping   . Blindness, legal     Past Surgical History  Procedure Laterality Date  . Dilation and curettage of uterus    . Laparotomy N/A 07/21/2013    Procedure: EXPLORATORY LAPAROTOMY/ PELVIC LYMPHADENECTOMY   ;  Surgeon: Jeannette Corpus, MD;  Location: [facility];  Service: Gynecology;  Laterality: N/A;  . Abdominal hysterectomy N/A 07/21/2013    Procedure: TOTAL HYSTERECTOMY ABDOMINAL ;  Surgeon: Jeannette Corpus, MD;  Location: [facility];  Service: Gynecology;  Laterality: N/A;  . Salpingoophorectomy Bilateral 07/21/2013    Procedure: BILATERAL SALPINGO OOPHORECTOMY;  Surgeon: Jeannette Corpus, MD;  Location: [facility];  Service: Gynecology;  Laterality: Bilateral;    Current Outpatient Prescriptions  Medication Sig Dispense Refill  . ALPRAZolam (XANAX) 0.25 MG tablet Take 2 tablets (0.5  mg total) by mouth 2 (two) times daily.  30 tablet  0  . bimatoprost (LUMIGAN) 0.01 % SOLN Place 1 drop into both eyes at bedtime.      . brimonidine-timolol (COMBIGAN) 0.2-0.5 % ophthalmic solution Place 1 drop into both eyes every 12 (twelve) hours.      . hydrochlorothiazide (HYDRODIURIL) 25 MG tablet       . lisinopril (PRINIVIL,ZESTRIL) 10 MG tablet       .  lisinopril-hydrochlorothiazide (ZESTORETIC) 20-12.5 MG per tablet Take 2 tablets by mouth daily.  90 tablet  3  . timolol (TIMOPTIC) 0.25 % ophthalmic solution       . enoxaparin (LOVENOX) 40 MG/0.4ML injection Inject 0.4 mLs (40 mg total) into the skin daily.  24 Syringe  0  . ferrous fumarate (HEMOCYTE - 106 MG FE) 325 (106 FE) MG TABS tablet Take 1 tablet by mouth daily.      Marland Kitchen oxyCODONE-acetaminophen (ROXICET) 5-325 MG per tablet Take 1 tablet by mouth every 4 (four) hours as needed for pain.  30 tablet  0   No current facility-administered medications for this visit.    History   Social History  . Marital Status: Divorced    Spouse Name: N/A    Number of Children: N/A  . Years of Education: N/A   Occupational History  . Not on file.   Social History Main Topics  . Smoking status: Never Smoker   . Smokeless tobacco: Never Used  . Alcohol Use: No  . Drug Use: No  . Sexual Activity: Not Currently    Birth Control/ Protection: None   Other Topics Concern  . Not on file   Social History Narrative  . No narrative on file    Family History  Problem Relation Age of Onset  . Hypertension Father   . Vision loss Maternal Grandmother       Jeannette Corpus, MD 09/02/2013, 1:40 PM

## 2013-10-01 ENCOUNTER — Telehealth: Payer: Self-pay | Admitting: Internal Medicine

## 2013-10-01 NOTE — Telephone Encounter (Signed)
Pt called in today to see if she can get a script filled for ALPRAZolam (XANAX) 0.25 MG tablet;Pt was made aware of the narcotics policy and does not have transportation for a walk-in visit for today; Please f/u with pt @ [phone]

## 2013-10-02 ENCOUNTER — Telehealth: Payer: Self-pay | Admitting: Emergency Medicine

## 2013-10-02 MED ORDER — ALPRAZOLAM 0.25 MG PO TABS: 0.5000 mg | ORAL_TABLET | Freq: Two times a day (BID) | ORAL | Status: DC

## 2013-10-02 NOTE — Telephone Encounter (Signed)
Spoke with pt son Lyn Hollingshead. States he will pick script Xanax at pharmacy to day or Monday

## 2013-10-05 ENCOUNTER — Telehealth: Payer: Self-pay | Admitting: Internal Medicine

## 2013-10-05 NOTE — Telephone Encounter (Signed)
Pt called regarding a refill of her medication, please contact pt °

## 2013-10-05 NOTE — Telephone Encounter (Signed)
Patient called to let us know her son will be coming by the office To pick up her prescription

## 2013-10-12 ENCOUNTER — Telehealth: Payer: Self-pay | Admitting: Internal Medicine

## 2013-10-12 NOTE — Telephone Encounter (Signed)
Pt called to inform us that she took her child to the pediatricsians office and they told her that her child has scabbies and that everyone in the house hold should get treated. Pt would like to know if we could call in the cream for her. Please contact pt.

## 2013-10-13 ENCOUNTER — Telehealth: Payer: Self-pay | Admitting: Emergency Medicine

## 2013-10-13 ENCOUNTER — Other Ambulatory Visit: Payer: Self-pay | Admitting: Emergency Medicine

## 2013-10-13 MED ORDER — PERMETHRIN 1 % EX LOTN: 1.0000 "application " | TOPICAL_LOTION | Freq: Once | CUTANEOUS | Status: DC

## 2013-10-13 MED ORDER — PERMETHRIN 5 % EX CREA: 1.0000 "application " | TOPICAL_CREAM | Freq: Once | CUTANEOUS | Status: DC

## 2013-10-13 NOTE — Telephone Encounter (Signed)
Spoke with pt regarding medication needed to treat possible scabies infection. Pt states son went to urgent care today and prescribed Permethrin lotion, and was told whole house needs treatment. Script for Permethrin e-scribed to [facility] [place]

## 2013-11-16 ENCOUNTER — Telehealth: Payer: Self-pay | Admitting: *Deleted

## 2013-11-16 NOTE — Telephone Encounter (Signed)
Pt called states "My knee is swollen and my calf hurts." Pt advised she has bee going up and down a lot stairs doing some laundry and she thinks this may be her arthritis acting up as she usaully has knee pain and mild swelling to her knee. Pt took advil yesterday and symptoms went away. Pt has not taken any Advil today. Pt denies swelling in one leg more than other, skin is not warm to touch and without redness. Discussed with pt to take advil, continue to monitor swelling of knee, if symptoms persist or worsen,pt to be seen in urgent care.. Her discomfort could be related to the constant up and down of the stairs. I will review her concerns with NP in the morning. Pt verbalized understanding.

## 2013-11-19 DIAGNOSIS — H409 Unspecified glaucoma: Secondary | ICD-10-CM | POA: Diagnosis not present

## 2013-11-19 DIAGNOSIS — H4010X0 Unspecified open-angle glaucoma, stage unspecified: Secondary | ICD-10-CM | POA: Diagnosis not present

## 2013-11-19 DIAGNOSIS — H2510 Age-related nuclear cataract, unspecified eye: Secondary | ICD-10-CM | POA: Diagnosis not present

## 2013-11-27 ENCOUNTER — Telehealth: Payer: Self-pay | Admitting: Emergency Medicine

## 2013-11-27 ENCOUNTER — Other Ambulatory Visit: Payer: Self-pay | Admitting: Emergency Medicine

## 2013-11-27 DIAGNOSIS — H2510 Age-related nuclear cataract, unspecified eye: Secondary | ICD-10-CM | POA: Diagnosis not present

## 2013-11-27 MED ORDER — ALPRAZOLAM 0.25 MG PO TABS: 0.5000 mg | ORAL_TABLET | Freq: Two times a day (BID) | ORAL | Status: DC

## 2013-11-27 NOTE — Telephone Encounter (Signed)
Son here to pick up mother Stephanie [name] script Xanax #30 per Dr. Doreene Burke. Verbal order given from Dr. Doreene Burke  For signature

## 2013-12-07 ENCOUNTER — Ambulatory Visit: Payer: Medicare Other | Attending: Internal Medicine

## 2013-12-07 ENCOUNTER — Telehealth: Payer: Self-pay | Admitting: Emergency Medicine

## 2013-12-07 ENCOUNTER — Telehealth: Payer: Self-pay | Admitting: Internal Medicine

## 2013-12-07 VITALS — BP 148/104 | HR 99 | Temp 98.3°F | Resp 16

## 2013-12-07 DIAGNOSIS — Z09 Encounter for follow-up examination after completed treatment for conditions other than malignant neoplasm: Secondary | ICD-10-CM | POA: Diagnosis not present

## 2013-12-07 DIAGNOSIS — I10 Essential (primary) hypertension: Secondary | ICD-10-CM

## 2013-12-07 MED ORDER — AMLODIPINE BESYLATE 10 MG PO TABS: 10.0000 mg | ORAL_TABLET | Freq: Every day | ORAL | Status: DC

## 2013-12-07 MED ORDER — CLONIDINE HCL 0.1 MG PO TABS
0.2000 mg | ORAL_TABLET | Freq: Once | ORAL | Status: AC
  Administered 2013-12-07: 0.2 mg via ORAL

## 2013-12-07 NOTE — Telephone Encounter (Signed)
Pt called in to inform nurse & PCP that her medication needs to be adjusted because she is unable to receive surgery until her BP stabilizes; pt stated that she is on the way to the clinic now;

## 2013-12-07 NOTE — Progress Notes (Signed)
Pt here for blood pressure check with possible adjustment r/t eye surgery scheduled 12/11/13 BP 187/147 133 denies chest pain or sob

## 2013-12-07 NOTE — Patient Instructions (Signed)
Pt instructed to start taking new prescribed BP medication Norvasc 10 mg and return on Thursday for recheck Also spoke with Dr. Ellamae Sia office nurse Joslyn Devon to update changes fro scheduled surgery

## 2013-12-07 NOTE — Telephone Encounter (Signed)
Pt called in requesting blood pressure recheck with med adjustment for upcoming eye surgery.

## 2013-12-11 ENCOUNTER — Ambulatory Visit: Payer: Medicare Other | Admitting: Gynecology

## 2013-12-14 ENCOUNTER — Ambulatory Visit: Payer: Medicare Other | Attending: Internal Medicine

## 2013-12-14 NOTE — Patient Instructions (Signed)
Pt instructed to keep taking daily BP medications  Dr. Doreene Burke will speak with surgeon tomorrow

## 2013-12-14 NOTE — Progress Notes (Unsigned)
Pt is here for blood pressure recheck s/p hypertensive episodes. Pt is supposed to have surgery on Friday and needs cleared. BP at home running 110's/60-70's per pt BP 177/116 denies h/a's

## 2013-12-15 ENCOUNTER — Telehealth: Payer: Self-pay | Admitting: Internal Medicine

## 2013-12-15 NOTE — Telephone Encounter (Signed)
[facility] needs records for 12/14/13 bp check visit.  Please fax to [phone].

## 2013-12-16 ENCOUNTER — Telehealth: Payer: Self-pay

## 2013-12-16 NOTE — Telephone Encounter (Signed)
Returned patient phone call She stated that she had just got off the phone  With [facility] and they did receive the paperwork they were  Waiting on

## 2013-12-16 NOTE — Telephone Encounter (Addendum)
[facility] clinic wants to schedule pt for surgery on Monday, 12/21/13 but pt needs medical release due to bp issues. Please f/u with Colletta Maryland at [place].

## 2013-12-16 NOTE — Telephone Encounter (Signed)
Spoke with Colletta Maryland from [facility] regarding BP clearance for pt surgery. Pt chart faxed to office. Pt is scheduled for surgery on Friday

## 2013-12-21 DIAGNOSIS — R000 Tachycardia, unspecified: Secondary | ICD-10-CM | POA: Diagnosis not present

## 2013-12-21 DIAGNOSIS — M2629 Other anomalies of dental arch relationship: Secondary | ICD-10-CM | POA: Diagnosis not present

## 2013-12-21 DIAGNOSIS — I10 Essential (primary) hypertension: Secondary | ICD-10-CM | POA: Diagnosis not present

## 2013-12-21 DIAGNOSIS — Z880 Allergy status to penicillin: Secondary | ICD-10-CM | POA: Diagnosis not present

## 2013-12-21 DIAGNOSIS — H4010X0 Unspecified open-angle glaucoma, stage unspecified: Secondary | ICD-10-CM | POA: Diagnosis not present

## 2013-12-21 DIAGNOSIS — M1710 Unilateral primary osteoarthritis, unspecified knee: Secondary | ICD-10-CM | POA: Diagnosis not present

## 2013-12-21 DIAGNOSIS — R450 Nervousness: Secondary | ICD-10-CM | POA: Diagnosis not present

## 2013-12-21 DIAGNOSIS — Z888 Allergy status to other drugs, medicaments and biological substances status: Secondary | ICD-10-CM | POA: Diagnosis not present

## 2013-12-21 DIAGNOSIS — L74519 Primary focal hyperhidrosis, unspecified: Secondary | ICD-10-CM | POA: Diagnosis not present

## 2013-12-21 DIAGNOSIS — Z79899 Other long term (current) drug therapy: Secondary | ICD-10-CM | POA: Diagnosis not present

## 2013-12-21 DIAGNOSIS — H2510 Age-related nuclear cataract, unspecified eye: Secondary | ICD-10-CM | POA: Diagnosis not present

## 2013-12-21 DIAGNOSIS — H409 Unspecified glaucoma: Secondary | ICD-10-CM | POA: Diagnosis not present

## 2013-12-22 DIAGNOSIS — H2510 Age-related nuclear cataract, unspecified eye: Secondary | ICD-10-CM | POA: Diagnosis not present

## 2013-12-25 ENCOUNTER — Telehealth: Payer: Self-pay | Admitting: Internal Medicine

## 2013-12-25 DIAGNOSIS — D509 Iron deficiency anemia, unspecified: Secondary | ICD-10-CM

## 2013-12-25 NOTE — Telephone Encounter (Signed)
Pt called regarding a refill of her medication ALPRAZolam (XANAX) 0.25 MG, please contact pt as soon as possible

## 2013-12-28 ENCOUNTER — Other Ambulatory Visit: Payer: Self-pay | Admitting: *Deleted

## 2013-12-28 DIAGNOSIS — F411 Generalized anxiety disorder: Secondary | ICD-10-CM

## 2013-12-28 MED ORDER — ALPRAZOLAM 0.25 MG PO TABS: 0.5000 mg | ORAL_TABLET | Freq: Two times a day (BID) | ORAL | Status: DC

## 2013-12-30 NOTE — Telephone Encounter (Signed)
Spoke with patient and she has received medication

## 2014-01-04 ENCOUNTER — Encounter: Payer: Self-pay | Admitting: Gynecology

## 2014-01-04 ENCOUNTER — Other Ambulatory Visit (HOSPITAL_COMMUNITY)
Admission: RE | Admit: 2014-01-04 | Discharge: 2014-01-04 | Disposition: A | Discharge status: Home / Self Care | Payer: Medicare Other | Source: Ambulatory Visit | Attending: Gynecology | Admitting: Gynecology

## 2014-01-04 ENCOUNTER — Ambulatory Visit: Payer: Medicare Other | Attending: Gynecology | Admitting: Gynecology

## 2014-01-04 VITALS — BP 168/89 | HR 100 | Temp 98.5°F | Resp 16 | Ht 65.94 in | Wt 145.0 lb

## 2014-01-04 DIAGNOSIS — Z79899 Other long term (current) drug therapy: Secondary | ICD-10-CM | POA: Insufficient documentation

## 2014-01-04 DIAGNOSIS — H548 Legal blindness, as defined in USA: Secondary | ICD-10-CM | POA: Diagnosis not present

## 2014-01-04 DIAGNOSIS — I10 Essential (primary) hypertension: Secondary | ICD-10-CM | POA: Diagnosis not present

## 2014-01-04 DIAGNOSIS — H409 Unspecified glaucoma: Secondary | ICD-10-CM | POA: Diagnosis not present

## 2014-01-04 DIAGNOSIS — C541 Malignant neoplasm of endometrium: Secondary | ICD-10-CM

## 2014-01-04 DIAGNOSIS — Z124 Encounter for screening for malignant neoplasm of cervix: Secondary | ICD-10-CM | POA: Diagnosis not present

## 2014-01-04 DIAGNOSIS — Z90710 Acquired absence of both cervix and uterus: Secondary | ICD-10-CM | POA: Insufficient documentation

## 2014-01-04 DIAGNOSIS — Z880 Allergy status to penicillin: Secondary | ICD-10-CM | POA: Diagnosis not present

## 2014-01-04 DIAGNOSIS — C549 Malignant neoplasm of corpus uteri, unspecified: Secondary | ICD-10-CM | POA: Insufficient documentation

## 2014-01-04 DIAGNOSIS — Z888 Allergy status to other drugs, medicaments and biological substances status: Secondary | ICD-10-CM | POA: Insufficient documentation

## 2014-01-04 DIAGNOSIS — F411 Generalized anxiety disorder: Secondary | ICD-10-CM | POA: Diagnosis not present

## 2014-01-04 NOTE — Progress Notes (Signed)
Consult Note: Gyn-Onc   Stephanie [name] 68 y.o. female  No chief complaint on file.   Assessment : Stage IA grade 2 endometrial carcinoma no evidence of disease.  Plan: A Pap smear is obtained. She returned to see me in 3 months for followup.  Interval History: The patient returns today as previously scheduled. Since her last visit she's done well. She denies any GI GU or pelvic symptoms. Her functional status is excellent. She's recently undergone cataract surgery and is quite happy with the outcome on her left eye. The right I will be done next week.  HPI: Patient was initially referred with a long history of abnormal uterine bleeding. She presumed is a secondary uterine fibroids. However she was found to have a grade 1 endometrial carcinoma in August 2014 and underwent TAH BSO pelvic lymphadenectomy. Final pathology showed a stage I a grade 2 endometrial carcinoma and no adjuvant therapy was recommended. Surgery was performed on 07/21/2013.  Final pathology showed a grade 2 lesion which was superficially invasive. All lymph nodes were negative. No adjuvant therapy was recommended.  Review of Systems:10 point review of systems is negative except as noted in interval history.   Vitals: Blood pressure 168/89, pulse 100, temperature 98.5 F (36.9 C), temperature source Oral, resp. rate 16, height 5' 5.94" (1.675 m), weight 145 lb (65.772 kg).  Physical Exam: General : The patient is a healthy woman in no acute distress.  HEENT: normocephalic, extraoccular movements normal; neck is supple without thyromegally  Lynphnodes: Supraclavicular and inguinal nodes not enlarged  Abdomen: Soft, non-tender, no ascites, no organomegally, no masses, no hernias , midline incision is healing well. Pelvic:  EGBUS: Normal female  Vagina: Normal, no lesions , vaginal cuff is well healed. Urethra and Bladder: Normal, non-tender  Cervix: Surgically absent  Uterus: Surgically absent  Bi-manual  examination: Non-tender; no adenxal masses or nodularity  Rectal: normal sphincter tone, no masses, no blood  Lower extremities: No edema or varicosities. Normal range of motion      Allergies  Allergen Reactions  . Iron Anaphylaxis    IV Iron  . Iron Dextran Anaphylaxis  . Penicillins Hives and Swelling    PT, STATED ALLERGIES TO ALL " CILLINS" Tongue swelling  . Amoxicillin Other (See Comments)    Unknown reaction  . Ampicillin Other (See Comments)    Unknown reaction  . Diamox [Acetazolamide] Nausea And Vomiting, Diarrhea and Nausea Only    Other reaction(s): Cramps (ALLERGY/intolerance) Severe nausea and vomitting  . Penicillin G Swelling and Hives    Past Medical History  Diagnosis Date  . Glaucoma   . Anxiety   . PNA (pneumonia)   . Cataract   . Complication of anesthesia   . PONV (postoperative nausea and vomiting)   . Hypertension   . Anemia   . Vaginal bleeding   . History of transfusion   . Fibroids   . Endometrial cancer   . Difficulty sleeping   . Blindness, legal     Past Surgical History  Procedure Laterality Date  . Dilation and curettage of uterus    . Laparotomy N/A 07/21/2013    Procedure: EXPLORATORY LAPAROTOMY/ PELVIC LYMPHADENECTOMY   ;  Surgeon: Alvino Chapel, MD;  Location: [facility];  Service: Gynecology;  Laterality: N/A;  . Abdominal hysterectomy N/A 07/21/2013    Procedure: TOTAL HYSTERECTOMY ABDOMINAL ;  Surgeon: Alvino Chapel, MD;  Location: [facility];  Service: Gynecology;  Laterality: N/A;  . Salpingoophorectomy Bilateral 07/21/2013  Procedure: BILATERAL SALPINGO OOPHORECTOMY;  Surgeon: Alvino Chapel, MD;  Location: [facility];  Service: Gynecology;  Laterality: Bilateral;    Current Outpatient Prescriptions  Medication Sig Dispense Refill  . ALPRAZolam (XANAX) 0.25 MG tablet Take 2 tablets (0.5 mg total) by mouth 2 (two) times daily.  60 tablet  0  . amLODipine (NORVASC) 10 MG tablet Take 1 tablet (10 mg  total) by mouth daily.  90 tablet  3  . bimatoprost (LUMIGAN) 0.01 % SOLN Place 1 drop into both eyes at bedtime.      . brimonidine-timolol (COMBIGAN) 0.2-0.5 % ophthalmic solution Place 1 drop into both eyes every 12 (twelve) hours.      . docusate sodium (COLACE) 100 MG capsule Take 100 mg by mouth.      . dorzolamide (TRUSOPT) 2 % ophthalmic solution       . enoxaparin (LOVENOX) 40 MG/0.4ML injection Inject 0.4 mLs (40 mg total) into the skin daily.  24 Syringe  0  . ferrous fumarate (HEMOCYTE - 106 MG FE) 325 (106 FE) MG TABS tablet Take 1 tablet by mouth daily.      . hydrochlorothiazide (HYDRODIURIL) 25 MG tablet       . lisinopril (PRINIVIL,ZESTRIL) 10 MG tablet       . lisinopril-hydrochlorothiazide (ZESTORETIC) 20-12.5 MG per tablet Take 2 tablets by mouth daily.  90 tablet  3  . oxyCODONE-acetaminophen (ROXICET) 5-325 MG per tablet Take 1 tablet by mouth every 4 (four) hours as needed for pain.  30 tablet  0  . permethrin (ACTICIN) 5 % cream Apply 1 application topically once. Apply topically and keep on for 12 hours then rinse off. Repeat in one week if needed  60 g  0  . timolol (TIMOPTIC) 0.25 % ophthalmic solution       . VIGAMOX 0.5 % ophthalmic solution        No current facility-administered medications for this visit.    History   Social History  . Marital Status: Divorced    Spouse Name: N/A    Number of Children: N/A  . Years of Education: N/A   Occupational History  . Not on file.   Social History Main Topics  . Smoking status: Never Smoker   . Smokeless tobacco: Never Used  . Alcohol Use: No  . Drug Use: No  . Sexual Activity: Not Currently    Birth Control/ Protection: None   Other Topics Concern  . Not on file   Social History Narrative  . No narrative on file    Family History  Problem Relation Age of Onset  . Hypertension Father   . Vision loss Maternal Grandmother       Alvino Chapel, MD 01/04/2014, 3:09 PM

## 2014-01-04 NOTE — Patient Instructions (Signed)
Follow up in 3 months

## 2014-01-05 DIAGNOSIS — Z9889 Other specified postprocedural states: Secondary | ICD-10-CM | POA: Diagnosis not present

## 2014-01-11 ENCOUNTER — Telehealth: Payer: Self-pay | Admitting: *Deleted

## 2014-01-11 NOTE — Telephone Encounter (Signed)
Per NP notified pt Pap smear was normal. Pt verbalized understanding. No concerns at this time

## 2014-01-11 NOTE — Telephone Encounter (Signed)
Message copied by Lucile Crater on Mon Jan 11, 2014  8:43 AM ------      Message from: Joylene John D      Created: Fri Jan 08, 2014  5:13 PM       Please let her know that her pap smear was normal.            ----- Message -----         From: [facility]         Sent: 01/08/2014   3:44 PM           To: Dorothyann Gibbs, NP                   ------

## 2014-01-12 DIAGNOSIS — H4010X0 Unspecified open-angle glaucoma, stage unspecified: Secondary | ICD-10-CM | POA: Diagnosis not present

## 2014-01-12 DIAGNOSIS — H2510 Age-related nuclear cataract, unspecified eye: Secondary | ICD-10-CM | POA: Diagnosis not present

## 2014-01-12 DIAGNOSIS — H25019 Cortical age-related cataract, unspecified eye: Secondary | ICD-10-CM | POA: Diagnosis not present

## 2014-01-12 DIAGNOSIS — Z79899 Other long term (current) drug therapy: Secondary | ICD-10-CM | POA: Diagnosis not present

## 2014-01-12 DIAGNOSIS — H43819 Vitreous degeneration, unspecified eye: Secondary | ICD-10-CM | POA: Diagnosis not present

## 2014-01-12 DIAGNOSIS — Z9849 Cataract extraction status, unspecified eye: Secondary | ICD-10-CM | POA: Diagnosis not present

## 2014-01-12 DIAGNOSIS — I10 Essential (primary) hypertension: Secondary | ICD-10-CM | POA: Diagnosis not present

## 2014-01-12 DIAGNOSIS — Z880 Allergy status to penicillin: Secondary | ICD-10-CM | POA: Diagnosis not present

## 2014-01-12 DIAGNOSIS — Z9889 Other specified postprocedural states: Secondary | ICD-10-CM | POA: Diagnosis not present

## 2014-01-12 DIAGNOSIS — H409 Unspecified glaucoma: Secondary | ICD-10-CM | POA: Diagnosis not present

## 2014-01-12 DIAGNOSIS — Z881 Allergy status to other antibiotic agents status: Secondary | ICD-10-CM | POA: Diagnosis not present

## 2014-01-12 DIAGNOSIS — M129 Arthropathy, unspecified: Secondary | ICD-10-CM | POA: Diagnosis not present

## 2014-01-12 DIAGNOSIS — H00029 Hordeolum internum unspecified eye, unspecified eyelid: Secondary | ICD-10-CM | POA: Diagnosis not present

## 2014-01-12 DIAGNOSIS — Z961 Presence of intraocular lens: Secondary | ICD-10-CM | POA: Diagnosis not present

## 2014-01-12 DIAGNOSIS — Z9109 Other allergy status, other than to drugs and biological substances: Secondary | ICD-10-CM | POA: Diagnosis not present

## 2014-01-12 DIAGNOSIS — Z888 Allergy status to other drugs, medicaments and biological substances status: Secondary | ICD-10-CM | POA: Diagnosis not present

## 2014-01-13 DIAGNOSIS — H4010X0 Unspecified open-angle glaucoma, stage unspecified: Secondary | ICD-10-CM | POA: Diagnosis not present

## 2014-01-13 DIAGNOSIS — H409 Unspecified glaucoma: Secondary | ICD-10-CM | POA: Diagnosis not present

## 2014-01-26 DIAGNOSIS — H409 Unspecified glaucoma: Secondary | ICD-10-CM | POA: Diagnosis not present

## 2014-01-26 DIAGNOSIS — H4010X0 Unspecified open-angle glaucoma, stage unspecified: Secondary | ICD-10-CM | POA: Diagnosis not present

## 2014-01-27 ENCOUNTER — Other Ambulatory Visit: Payer: Self-pay | Admitting: Emergency Medicine

## 2014-01-27 MED ORDER — LISINOPRIL-HYDROCHLOROTHIAZIDE 20-12.5 MG PO TABS: 2.0000 | ORAL_TABLET | Freq: Every day | ORAL | Status: DC

## 2014-02-02 ENCOUNTER — Ambulatory Visit: Payer: Medicare Other | Attending: Internal Medicine

## 2014-02-02 NOTE — Progress Notes (Unsigned)
Patient ID: Stephanie [name], female   DOB: 1945-12-14, 68 y.o.   MRN: [id number] Pt comes in for Pneumococcal Vaccine Denies pain or any concerns Injection given and drug reaction explained Pt verbalized understanding

## 2014-02-05 ENCOUNTER — Other Ambulatory Visit: Payer: Self-pay

## 2014-02-05 DIAGNOSIS — F411 Generalized anxiety disorder: Secondary | ICD-10-CM

## 2014-02-05 MED ORDER — AMLODIPINE BESYLATE 10 MG PO TABS: 10.0000 mg | ORAL_TABLET | Freq: Every day | ORAL | Status: DC

## 2014-02-16 DIAGNOSIS — H409 Unspecified glaucoma: Secondary | ICD-10-CM | POA: Diagnosis not present

## 2014-02-16 DIAGNOSIS — H4010X0 Unspecified open-angle glaucoma, stage unspecified: Secondary | ICD-10-CM | POA: Diagnosis not present

## 2014-02-16 DIAGNOSIS — Z9889 Other specified postprocedural states: Secondary | ICD-10-CM | POA: Diagnosis not present

## 2014-02-26 ENCOUNTER — Telehealth: Payer: Self-pay | Admitting: Internal Medicine

## 2014-02-26 NOTE — Telephone Encounter (Signed)
Pt is calling in today to request medical advice; pt states that she has a rash on her arm and would like to know if she can start taking benadryl along with her current medications without any conflict; please f/u with pt

## 2014-02-26 NOTE — Telephone Encounter (Signed)
Returning patient call in regards to using Benadryl. Patient reports she broke out in a rash all over body and she was pulled off her medications one by one to make sure she was not having an allergic reaction. Patient reports she has been using a hydrocortisone cream and her rash is only on her right arm. Patient wanted to know if she can use Benadryl on it since it itches. Informed patient she can use Benadryl on the rash and if the rash does not clear up in 7 days to come into the office to see PCP. Patient verbalized understanding. Alverda Skeans, RN

## 2014-03-05 ENCOUNTER — Other Ambulatory Visit: Payer: Self-pay | Admitting: Internal Medicine

## 2014-03-15 ENCOUNTER — Other Ambulatory Visit: Payer: Self-pay | Admitting: Internal Medicine

## 2014-03-15 DIAGNOSIS — F411 Generalized anxiety disorder: Secondary | ICD-10-CM

## 2014-03-15 MED ORDER — ALPRAZOLAM 0.25 MG PO TABS: 0.5000 mg | ORAL_TABLET | Freq: Two times a day (BID) | ORAL | Status: DC

## 2014-03-15 NOTE — Telephone Encounter (Signed)
Pt has called in today to request a medication refill on ALPRAZolam (XANAX) 0.25 MG tablet; pt states she has left a VM on nurse line; please f/u with pt at earliest convenience

## 2014-03-15 NOTE — Telephone Encounter (Signed)
Returned patient's called in regards to wanting a refill on Xanax.

## 2014-03-16 ENCOUNTER — Telehealth: Payer: Self-pay | Admitting: Emergency Medicine

## 2014-03-16 NOTE — Telephone Encounter (Signed)
Left message that pt can pick script for Xanax up at front desk

## 2014-03-18 DIAGNOSIS — H2640 Unspecified secondary cataract: Secondary | ICD-10-CM | POA: Diagnosis not present

## 2014-03-31 ENCOUNTER — Other Ambulatory Visit: Payer: Self-pay | Admitting: Internal Medicine

## 2014-04-07 DIAGNOSIS — H4011X0 Primary open-angle glaucoma, stage unspecified: Secondary | ICD-10-CM | POA: Diagnosis not present

## 2014-04-07 DIAGNOSIS — H409 Unspecified glaucoma: Secondary | ICD-10-CM | POA: Diagnosis not present

## 2014-04-07 DIAGNOSIS — Z9889 Other specified postprocedural states: Secondary | ICD-10-CM | POA: Diagnosis not present

## 2014-04-12 ENCOUNTER — Ambulatory Visit: Payer: Medicare Other | Admitting: Gynecology

## 2014-04-19 DIAGNOSIS — H409 Unspecified glaucoma: Secondary | ICD-10-CM | POA: Diagnosis not present

## 2014-04-19 DIAGNOSIS — H4011X0 Primary open-angle glaucoma, stage unspecified: Secondary | ICD-10-CM | POA: Diagnosis not present

## 2014-04-30 ENCOUNTER — Ambulatory Visit: Payer: Medicare Other | Admitting: Gynecology

## 2014-05-05 DIAGNOSIS — H31429 Serous choroidal detachment, unspecified eye: Secondary | ICD-10-CM | POA: Diagnosis not present

## 2014-05-11 DIAGNOSIS — H4020X0 Unspecified primary angle-closure glaucoma, stage unspecified: Secondary | ICD-10-CM | POA: Diagnosis not present

## 2014-05-11 DIAGNOSIS — Z961 Presence of intraocular lens: Secondary | ICD-10-CM | POA: Diagnosis not present

## 2014-05-11 DIAGNOSIS — H31429 Serous choroidal detachment, unspecified eye: Secondary | ICD-10-CM | POA: Diagnosis not present

## 2014-05-19 DIAGNOSIS — H31429 Serous choroidal detachment, unspecified eye: Secondary | ICD-10-CM | POA: Diagnosis not present

## 2014-05-19 DIAGNOSIS — H44429 Hypotony of unspecified eye due to ocular fistula: Secondary | ICD-10-CM | POA: Diagnosis not present

## 2014-05-19 DIAGNOSIS — H409 Unspecified glaucoma: Secondary | ICD-10-CM | POA: Diagnosis not present

## 2014-05-19 DIAGNOSIS — H4011X0 Primary open-angle glaucoma, stage unspecified: Secondary | ICD-10-CM | POA: Diagnosis not present

## 2014-05-19 DIAGNOSIS — H31409 Unspecified choroidal detachment, unspecified eye: Secondary | ICD-10-CM | POA: Diagnosis not present

## 2014-05-28 ENCOUNTER — Ambulatory Visit: Payer: Medicare Other | Admitting: Gynecology

## 2014-06-04 ENCOUNTER — Other Ambulatory Visit (HOSPITAL_COMMUNITY)
Admission: RE | Admit: 2014-06-04 | Discharge: 2014-06-04 | Disposition: A | Discharge status: Home / Self Care | Payer: Medicare Other | Source: Ambulatory Visit | Attending: Gynecology | Admitting: Gynecology

## 2014-06-04 ENCOUNTER — Ambulatory Visit: Payer: Medicare Other | Attending: Gynecology | Admitting: Gynecology

## 2014-06-04 ENCOUNTER — Encounter: Payer: Self-pay | Admitting: Gynecology

## 2014-06-04 VITALS — BP 148/95 | HR 116 | Temp 98.3°F | Resp 18 | Ht 65.0 in | Wt 141.4 lb

## 2014-06-04 DIAGNOSIS — C541 Malignant neoplasm of endometrium: Secondary | ICD-10-CM

## 2014-06-04 DIAGNOSIS — H409 Unspecified glaucoma: Secondary | ICD-10-CM | POA: Insufficient documentation

## 2014-06-04 DIAGNOSIS — D649 Anemia, unspecified: Secondary | ICD-10-CM | POA: Diagnosis not present

## 2014-06-04 DIAGNOSIS — Z124 Encounter for screening for malignant neoplasm of cervix: Secondary | ICD-10-CM | POA: Diagnosis present

## 2014-06-04 DIAGNOSIS — C549 Malignant neoplasm of corpus uteri, unspecified: Secondary | ICD-10-CM | POA: Diagnosis not present

## 2014-06-04 DIAGNOSIS — Z885 Allergy status to narcotic agent status: Secondary | ICD-10-CM | POA: Insufficient documentation

## 2014-06-04 DIAGNOSIS — Z79899 Other long term (current) drug therapy: Secondary | ICD-10-CM | POA: Diagnosis not present

## 2014-06-04 DIAGNOSIS — Z888 Allergy status to other drugs, medicaments and biological substances status: Secondary | ICD-10-CM | POA: Diagnosis not present

## 2014-06-04 DIAGNOSIS — I10 Essential (primary) hypertension: Secondary | ICD-10-CM | POA: Diagnosis not present

## 2014-06-04 DIAGNOSIS — Z880 Allergy status to penicillin: Secondary | ICD-10-CM | POA: Insufficient documentation

## 2014-06-04 DIAGNOSIS — Z8542 Personal history of malignant neoplasm of other parts of uterus: Secondary | ICD-10-CM | POA: Insufficient documentation

## 2014-06-04 DIAGNOSIS — H269 Unspecified cataract: Secondary | ICD-10-CM | POA: Diagnosis not present

## 2014-06-04 DIAGNOSIS — F411 Generalized anxiety disorder: Secondary | ICD-10-CM | POA: Diagnosis not present

## 2014-06-04 NOTE — Patient Instructions (Signed)
Follow up with Dr. Fermin Schwab in 3 months

## 2014-06-04 NOTE — Progress Notes (Signed)
Consult Note: Gyn-Onc   Stephanie [name] 68 y.o. female  Chief Complaint  Patient presents with  . Endometrial Cancer    Assessment : Stage IA grade 2 endometrial carcinoma no evidence of disease.  Plan: A Pap smear is obtained. She returned to see me in 3 months for followup.   Interval History: The patient returns today as previously scheduled. Since her last visit she's done well. She denies any GI GU or pelvic symptoms. Her functional status is excellent. She's recently undergone cataract surgery and is quite happy with the outcome on her left eye. Unfortunately, the glaucoma surgery has not been as successful as she is seeing a an ophthalmologist at [facility] on a weekly basis.   HPI: Patient was initially referred with a long history of abnormal uterine bleeding. She presumed is a secondary uterine fibroids. However she was found to have a grade 1 endometrial carcinoma in August 2014 and underwent TAH BSO pelvic lymphadenectomy. Final pathology showed a stage I a grade 2 endometrial carcinoma and no adjuvant therapy was recommended. Surgery was performed on 07/21/2013.  Final pathology showed a grade 2 lesion which was superficially invasive. All lymph nodes were negative. No adjuvant therapy was recommended.  Review of Systems:10 point review of systems is negative except as noted in interval history.   Vitals: Blood pressure 148/95, pulse 116, temperature 98.3 F (36.8 C), temperature source Oral, resp. rate 18, height 5\' 5"  (1.651 m), weight 141 lb 6.4 oz (64.139 kg).  Physical Exam: General : The patient is a healthy woman in no acute distress.  HEENT: normocephalic, extraoccular movements normal; neck is supple without thyromegally  Lynphnodes: Supraclavicular and inguinal nodes not enlarged  Abdomen: Soft, non-tender, no ascites, no organomegally, no masses, no hernias , midline incision is healing well. Pelvic:  EGBUS: Normal female  Vagina: Normal, no lesions , vaginal cuff  is well healed. Urethra and Bladder: Normal, non-tender  Cervix: Surgically absent  Uterus: Surgically absent  Bi-manual examination: Non-tender; no adenxal masses or nodularity  Rectal: normal sphincter tone, no masses, no blood  Lower extremities: No edema or varicosities. Normal range of motion      Allergies  Allergen Reactions  . Iron Anaphylaxis    IV Iron  . Iron Dextran Anaphylaxis  . Penicillins Hives and Swelling    PT, STATED ALLERGIES TO ALL " CILLINS" Tongue swelling  . Amoxicillin Other (See Comments)    Unknown reaction  . Ampicillin Other (See Comments)    Unknown reaction  . Codeine Nausea And Vomiting  . Diamox [Acetazolamide] Nausea And Vomiting, Diarrhea and Nausea Only    Other reaction(s): Cramps (ALLERGY/intolerance) Severe nausea and vomitting  . Penicillin G Swelling and Hives    Past Medical History  Diagnosis Date  . Glaucoma   . Anxiety   . PNA (pneumonia)   . Cataract   . Complication of anesthesia   . PONV (postoperative nausea and vomiting)   . Hypertension   . Anemia   . Vaginal bleeding   . History of transfusion   . Fibroids   . Endometrial cancer   . Difficulty sleeping   . Blindness, legal     Past Surgical History  Procedure Laterality Date  . Dilation and curettage of uterus    . Laparotomy N/A 07/21/2013    Procedure: EXPLORATORY LAPAROTOMY/ PELVIC LYMPHADENECTOMY   ;  Surgeon: Alvino Chapel, MD;  Location: [facility];  Service: Gynecology;  Laterality: N/A;  . Abdominal hysterectomy N/A 07/21/2013  Procedure: TOTAL HYSTERECTOMY ABDOMINAL ;  Surgeon: Alvino Chapel, MD;  Location: [facility];  Service: Gynecology;  Laterality: N/A;  . Salpingoophorectomy Bilateral 07/21/2013    Procedure: BILATERAL SALPINGO OOPHORECTOMY;  Surgeon: Alvino Chapel, MD;  Location: [facility];  Service: Gynecology;  Laterality: Bilateral;    Current Outpatient Prescriptions  Medication Sig Dispense Refill  . ALPRAZolam  (XANAX) 0.25 MG tablet Take 2 tablets (0.5 mg total) by mouth 2 (two) times daily.  60 tablet  0  . amLODipine (NORVASC) 10 MG tablet TAKE ONE TABLET BY MOUTH ONCE DAILY  30 tablet  0  . bimatoprost (LUMIGAN) 0.01 % SOLN Place 1 drop into both eyes at bedtime.      . brimonidine-timolol (COMBIGAN) 0.2-0.5 % ophthalmic solution Place 1 drop into both eyes every 12 (twelve) hours.      . cyclopentolate (CYCLODRYL,CYCLOGYL) 1 % ophthalmic solution Apply to eye.      . Difluprednate (DUREZOL) 0.05 % EMUL 1 drop.      Marland Kitchen docusate sodium (COLACE) 100 MG capsule Take 100 mg by mouth.      . dorzolamide (TRUSOPT) 2 % ophthalmic solution       . ketorolac (ACULAR) 0.5 % ophthalmic solution 1 drop.      Marland Kitchen lisinopril-hydrochlorothiazide (ZESTORETIC) 20-12.5 MG per tablet Take 2 tablets by mouth daily.  90 tablet  3  . neomycin-polymyxin-dexamethasone (MAXITROL) 0.1 % ophthalmic suspension Apply 1 drop to eye.      Marland Kitchen oxyCODONE-acetaminophen (ROXICET) 5-325 MG per tablet Take 1 tablet by mouth every 4 (four) hours as needed for pain.  30 tablet  0  . permethrin (ACTICIN) 5 % cream Apply 1 application topically once. Apply topically and keep on for 12 hours then rinse off. Repeat in one week if needed  60 g  0  . timolol (TIMOPTIC) 0.25 % ophthalmic solution       . VIGAMOX 0.5 % ophthalmic solution        No current facility-administered medications for this visit.    History   Social History  . Marital Status: Divorced    Spouse Name: N/A    Number of Children: N/A  . Years of Education: N/A   Occupational History  . Not on file.   Social History Main Topics  . Smoking status: Never Smoker   . Smokeless tobacco: Never Used  . Alcohol Use: No  . Drug Use: No  . Sexual Activity: Not Currently    Birth Control/ Protection: None   Other Topics Concern  . Not on file   Social History Narrative  . No narrative on file    Family History  Problem Relation Age of Onset  . Hypertension Father    . Vision loss Maternal Grandmother       Alvino Chapel, MD 06/04/2014, 2:21 PM

## 2014-06-04 NOTE — Addendum Note (Signed)
Addended by: Lucile Crater on: 06/04/2014 02:59 PM   Modules accepted: Orders

## 2014-06-09 DIAGNOSIS — H31429 Serous choroidal detachment, unspecified eye: Secondary | ICD-10-CM | POA: Diagnosis not present

## 2014-06-11 LAB — CYTOLOGY - PAP

## 2014-06-15 DIAGNOSIS — H409 Unspecified glaucoma: Secondary | ICD-10-CM | POA: Diagnosis not present

## 2014-06-15 DIAGNOSIS — H4011X0 Primary open-angle glaucoma, stage unspecified: Secondary | ICD-10-CM | POA: Diagnosis not present

## 2014-06-23 ENCOUNTER — Telehealth: Payer: Self-pay | Admitting: *Deleted

## 2014-06-23 NOTE — Telephone Encounter (Signed)
Notified pt pap smear was normal. Pt verbalized understanding

## 2014-07-01 ENCOUNTER — Other Ambulatory Visit: Payer: Self-pay | Admitting: Emergency Medicine

## 2014-07-01 DIAGNOSIS — F411 Generalized anxiety disorder: Secondary | ICD-10-CM

## 2014-07-01 MED ORDER — ALPRAZOLAM 0.25 MG PO TABS: 0.5000 mg | ORAL_TABLET | Freq: Two times a day (BID) | ORAL | Status: DC

## 2014-07-02 DIAGNOSIS — H409 Unspecified glaucoma: Secondary | ICD-10-CM | POA: Diagnosis not present

## 2014-07-02 DIAGNOSIS — H4011X0 Primary open-angle glaucoma, stage unspecified: Secondary | ICD-10-CM | POA: Diagnosis not present

## 2014-07-07 ENCOUNTER — Ambulatory Visit: Payer: Medicare Other

## 2014-07-07 ENCOUNTER — Ambulatory Visit: Payer: Medicare Other | Attending: Internal Medicine

## 2014-07-07 DIAGNOSIS — H31409 Unspecified choroidal detachment, unspecified eye: Secondary | ICD-10-CM | POA: Diagnosis not present

## 2014-07-07 DIAGNOSIS — Z23 Encounter for immunization: Secondary | ICD-10-CM | POA: Diagnosis not present

## 2014-07-07 DIAGNOSIS — Z961 Presence of intraocular lens: Secondary | ICD-10-CM | POA: Diagnosis not present

## 2014-07-07 DIAGNOSIS — H4011X0 Primary open-angle glaucoma, stage unspecified: Secondary | ICD-10-CM | POA: Diagnosis not present

## 2014-07-07 DIAGNOSIS — H44429 Hypotony of unspecified eye due to ocular fistula: Secondary | ICD-10-CM | POA: Diagnosis not present

## 2014-07-14 DIAGNOSIS — H31429 Serous choroidal detachment, unspecified eye: Secondary | ICD-10-CM | POA: Diagnosis not present

## 2014-07-20 ENCOUNTER — Other Ambulatory Visit: Payer: Self-pay | Admitting: Emergency Medicine

## 2014-07-20 MED ORDER — LISINOPRIL-HYDROCHLOROTHIAZIDE 20-12.5 MG PO TABS: 2.0000 | ORAL_TABLET | Freq: Every day | ORAL | Status: DC

## 2014-08-10 DIAGNOSIS — H4011X3 Primary open-angle glaucoma, severe stage: Secondary | ICD-10-CM | POA: Diagnosis not present

## 2014-08-10 DIAGNOSIS — Z961 Presence of intraocular lens: Secondary | ICD-10-CM | POA: Diagnosis not present

## 2014-08-16 ENCOUNTER — Encounter: Payer: Self-pay | Admitting: Gynecology

## 2014-08-23 DIAGNOSIS — H31421 Serous choroidal detachment, right eye: Secondary | ICD-10-CM | POA: Diagnosis not present

## 2014-09-06 ENCOUNTER — Ambulatory Visit: Payer: Medicare Other | Admitting: Gynecology

## 2014-09-17 DIAGNOSIS — H4011X3 Primary open-angle glaucoma, severe stage: Secondary | ICD-10-CM | POA: Diagnosis not present

## 2014-09-17 DIAGNOSIS — H44421 Hypotony of right eye due to ocular fistula: Secondary | ICD-10-CM | POA: Diagnosis not present

## 2014-09-20 ENCOUNTER — Ambulatory Visit: Payer: Medicare Other | Admitting: Gynecology

## 2014-09-27 ENCOUNTER — Ambulatory Visit: Payer: Medicare Other | Attending: Gynecology | Admitting: Gynecology

## 2014-09-27 ENCOUNTER — Telehealth: Payer: Self-pay | Admitting: Internal Medicine

## 2014-09-27 ENCOUNTER — Other Ambulatory Visit (HOSPITAL_COMMUNITY)
Admission: RE | Admit: 2014-09-27 | Discharge: 2014-09-27 | Disposition: A | Discharge status: Home / Self Care | Payer: Medicare Other | Source: Ambulatory Visit | Attending: Gynecology | Admitting: Gynecology

## 2014-09-27 ENCOUNTER — Encounter: Payer: Self-pay | Admitting: Gynecology

## 2014-09-27 VITALS — BP 158/91 | HR 126 | Temp 98.4°F | Resp 16 | Ht 65.0 in | Wt 146.0 lb

## 2014-09-27 DIAGNOSIS — Z8542 Personal history of malignant neoplasm of other parts of uterus: Secondary | ICD-10-CM | POA: Diagnosis not present

## 2014-09-27 DIAGNOSIS — H269 Unspecified cataract: Secondary | ICD-10-CM | POA: Insufficient documentation

## 2014-09-27 DIAGNOSIS — Z79899 Other long term (current) drug therapy: Secondary | ICD-10-CM | POA: Diagnosis not present

## 2014-09-27 DIAGNOSIS — Z01411 Encounter for gynecological examination (general) (routine) with abnormal findings: Secondary | ICD-10-CM | POA: Insufficient documentation

## 2014-09-27 DIAGNOSIS — D649 Anemia, unspecified: Secondary | ICD-10-CM | POA: Insufficient documentation

## 2014-09-27 DIAGNOSIS — D259 Leiomyoma of uterus, unspecified: Secondary | ICD-10-CM | POA: Diagnosis not present

## 2014-09-27 DIAGNOSIS — I10 Essential (primary) hypertension: Secondary | ICD-10-CM | POA: Diagnosis not present

## 2014-09-27 DIAGNOSIS — C541 Malignant neoplasm of endometrium: Secondary | ICD-10-CM | POA: Diagnosis not present

## 2014-09-27 DIAGNOSIS — H409 Unspecified glaucoma: Secondary | ICD-10-CM | POA: Insufficient documentation

## 2014-09-27 DIAGNOSIS — H548 Legal blindness, as defined in USA: Secondary | ICD-10-CM | POA: Insufficient documentation

## 2014-09-27 DIAGNOSIS — F419 Anxiety disorder, unspecified: Secondary | ICD-10-CM | POA: Diagnosis not present

## 2014-09-27 NOTE — Telephone Encounter (Signed)
Patient calling to request a medication refill for Xanax. Patient states she is all out of these meds. Patient transferred to nurse line. Please assist.

## 2014-09-27 NOTE — Patient Instructions (Signed)
Return to see us in 6 months. 

## 2014-09-27 NOTE — Addendum Note (Signed)
Addended by: Joylene John D on: 09/27/2014 03:42 PM   Modules accepted: Orders

## 2014-09-27 NOTE — Progress Notes (Signed)
Consult Note: Gyn-Onc   Stephanie [name] 68 y.o. female  Chief Complaint  Patient presents with  . endometrial cancer    Assessment : Stage IA grade 2 endometrial carcinoma October 2014 no evidence of disease.  Plan: A Pap smear is obtained. She returned to see me in 6 months for followup.   Interval History: The patient returns today as previously scheduled. Since her last visit she's done well. She denies any GI GU or pelvic symptoms. Her functional status is excellent.   It's notable that her vision is much improved over last visit.   HPI: Patient was initially referred with a long history of abnormal uterine bleeding. She presumed is a secondary uterine fibroids. However she was found to have a grade 1 endometrial carcinoma in August 2014 and underwent TAH BSO pelvic lymphadenectomy. Final pathology showed a stage I a grade 2 endometrial carcinoma and no adjuvant therapy was recommended. Surgery was performed on 07/21/2013.  Final pathology showed a grade 2 lesion which was superficially invasive. All lymph nodes were negative. No adjuvant therapy was recommended.  Review of Systems:10 point review of systems is negative except as noted in interval history.   Vitals: Blood pressure 158/91, pulse 126, temperature 98.4 F (36.9 C), temperature source Oral, resp. rate 16, height 5\' 5"  (1.651 m), weight 146 lb (66.225 kg).  Physical Exam: General : The patient is a healthy woman in no acute distress.  HEENT: normocephalic, extraoccular movements normal; neck is supple without thyromegally  Lynphnodes: Supraclavicular and inguinal nodes not enlarged  Abdomen: Soft, non-tender, no ascites, no organomegally, no masses, no hernias , midline incision is healing well. Pelvic:  EGBUS: Normal female  Vagina: Normal, no lesions , vaginal cuff is well healed. Urethra and Bladder: Normal, non-tender  Cervix: Surgically absent  Uterus: Surgically absent  Bi-manual examination: Non-tender;  no adenxal masses or nodularity  Rectal: normal sphincter tone, no masses, no blood  Lower extremities: No edema or varicosities. Normal range of motion      Allergies  Allergen Reactions  . Iron Anaphylaxis    IV Iron  . Iron Dextran Anaphylaxis  . Penicillins Hives and Swelling    PT, STATED ALLERGIES TO ALL " CILLINS" Tongue swelling  . Amoxicillin Other (See Comments)    Unknown reaction  . Ampicillin Other (See Comments)    Unknown reaction  . Codeine Nausea And Vomiting  . Diamox [Acetazolamide] Nausea And Vomiting, Diarrhea and Nausea Only    Other reaction(s): Cramps (ALLERGY/intolerance) Severe nausea and vomitting  . Penicillin G Swelling and Hives    Past Medical History  Diagnosis Date  . Glaucoma   . Anxiety   . PNA (pneumonia)   . Cataract   . Complication of anesthesia   . PONV (postoperative nausea and vomiting)   . Hypertension   . Anemia   . Vaginal bleeding   . History of transfusion   . Fibroids   . Endometrial cancer   . Difficulty sleeping   . Blindness, legal     Past Surgical History  Procedure Laterality Date  . Dilation and curettage of uterus    . Laparotomy N/A 07/21/2013    Procedure: EXPLORATORY LAPAROTOMY/ PELVIC LYMPHADENECTOMY   ;  Surgeon: Alvino Chapel, MD;  Location: [facility];  Service: Gynecology;  Laterality: N/A;  . Abdominal hysterectomy N/A 07/21/2013    Procedure: TOTAL HYSTERECTOMY ABDOMINAL ;  Surgeon: Alvino Chapel, MD;  Location: [facility];  Service: Gynecology;  Laterality: N/A;  .  Salpingoophorectomy Bilateral 07/21/2013    Procedure: BILATERAL SALPINGO OOPHORECTOMY;  Surgeon: Alvino Chapel, MD;  Location: [facility];  Service: Gynecology;  Laterality: Bilateral;    Current Outpatient Prescriptions  Medication Sig Dispense Refill  . ALPRAZolam (XANAX) 0.25 MG tablet Take 2 tablets (0.5 mg total) by mouth 2 (two) times daily. 60 tablet 0  . amLODipine (NORVASC) 10 MG tablet TAKE ONE TABLET BY  MOUTH ONCE DAILY 30 tablet 0  . bimatoprost (LUMIGAN) 0.01 % SOLN Place 1 drop into both eyes at bedtime.    . brimonidine-timolol (COMBIGAN) 0.2-0.5 % ophthalmic solution Place 1 drop into both eyes every 12 (twelve) hours.    . cyclopentolate (CYCLODRYL,CYCLOGYL) 1 % ophthalmic solution Apply to eye.    . Difluprednate (DUREZOL) 0.05 % EMUL 1 drop.    Marland Kitchen lisinopril-hydrochlorothiazide (ZESTORETIC) 20-12.5 MG per tablet Take 2 tablets by mouth daily. 90 tablet 3  . docusate sodium (COLACE) 100 MG capsule Take 100 mg by mouth.    . dorzolamide (TRUSOPT) 2 % ophthalmic solution     . ketorolac (ACULAR) 0.5 % ophthalmic solution 1 drop.    Marland Kitchen neomycin-polymyxin-dexamethasone (MAXITROL) 0.1 % ophthalmic suspension Apply 1 drop to eye.    Marland Kitchen oxyCODONE-acetaminophen (ROXICET) 5-325 MG per tablet Take 1 tablet by mouth every 4 (four) hours as needed for pain. (Patient not taking: Reported on 09/27/2014) 30 tablet 0  . permethrin (ACTICIN) 5 % cream Apply 1 application topically once. Apply topically and keep on for 12 hours then rinse off. Repeat in one week if needed (Patient not taking: Reported on 09/27/2014) 60 g 0  . timolol (TIMOPTIC) 0.25 % ophthalmic solution     . VIGAMOX 0.5 % ophthalmic solution      No current facility-administered medications for this visit.    History   Social History  . Marital Status: Divorced    Spouse Name: N/A    Number of Children: N/A  . Years of Education: N/A   Occupational History  . Not on file.   Social History Main Topics  . Smoking status: Never Smoker   . Smokeless tobacco: Never Used  . Alcohol Use: No  . Drug Use: No  . Sexual Activity: Not Currently    Birth Control/ Protection: None   Other Topics Concern  . Not on file   Social History Narrative    Family History  Problem Relation Age of Onset  . Hypertension Father   . Vision loss Maternal Grandmother       Alvino Chapel, MD 09/27/2014, 1:17 PM

## 2014-09-28 ENCOUNTER — Telehealth: Payer: Self-pay | Admitting: Emergency Medicine

## 2014-09-28 ENCOUNTER — Other Ambulatory Visit: Payer: Self-pay | Admitting: Emergency Medicine

## 2014-09-28 DIAGNOSIS — F411 Generalized anxiety disorder: Secondary | ICD-10-CM

## 2014-09-28 MED ORDER — ALPRAZOLAM 0.25 MG PO TABS: 0.5000 mg | ORAL_TABLET | Freq: Two times a day (BID) | ORAL | Status: DC

## 2014-09-28 NOTE — Telephone Encounter (Signed)
Pt son came to pick Xanax script up for mother per Dr. Doreene Burke

## 2014-09-29 LAB — CYTOLOGY - PAP

## 2014-10-01 ENCOUNTER — Telehealth: Payer: Self-pay | Admitting: Gynecologic Oncology

## 2014-10-01 NOTE — Telephone Encounter (Signed)
Message left for patient with pap smear results: negative.  Instructed to call for any questions or concerns.  

## 2014-10-13 DIAGNOSIS — Z961 Presence of intraocular lens: Secondary | ICD-10-CM | POA: Diagnosis not present

## 2014-10-13 DIAGNOSIS — H4011X3 Primary open-angle glaucoma, severe stage: Secondary | ICD-10-CM | POA: Diagnosis not present

## 2014-11-15 DIAGNOSIS — H31421 Serous choroidal detachment, right eye: Secondary | ICD-10-CM | POA: Diagnosis not present

## 2014-11-22 ENCOUNTER — Telehealth: Payer: Self-pay | Admitting: Internal Medicine

## 2014-11-22 ENCOUNTER — Telehealth: Payer: Self-pay | Admitting: Emergency Medicine

## 2014-11-22 NOTE — Telephone Encounter (Signed)
Nurse is calling to speak to PCP's nurse, she states that the patients heartbeat is irregular and would like some advice. Please f/u with nurse.

## 2014-11-22 NOTE — Telephone Encounter (Signed)
Nanine Means, NP called in to report pt heart rate during home visit States BP was stable with sustained tachycardic x 45 minutes Pt is taking BP meds, denies chest pain or sob Will route note message to Dr. Doreene Burke for further evaluation

## 2014-11-22 NOTE — Telephone Encounter (Signed)
Patient called returning nurse's phone call, she stated the her bp is 117/76 and heart rate is 82. Please f/u with pt.

## 2014-11-23 ENCOUNTER — Telehealth: Payer: Self-pay | Admitting: Emergency Medicine

## 2014-11-23 NOTE — Telephone Encounter (Signed)
Left message for pt to call  Walk in hours provided

## 2014-12-16 ENCOUNTER — Telehealth: Payer: Self-pay | Admitting: Emergency Medicine

## 2014-12-16 ENCOUNTER — Other Ambulatory Visit: Payer: Self-pay | Admitting: Emergency Medicine

## 2014-12-16 DIAGNOSIS — F411 Generalized anxiety disorder: Secondary | ICD-10-CM

## 2014-12-16 MED ORDER — ALPRAZOLAM 0.25 MG PO TABS: 0.5000 mg | ORAL_TABLET | Freq: Two times a day (BID) | ORAL | Status: DC

## 2014-12-16 NOTE — Telephone Encounter (Signed)
Left message that pt son can pick medication Xanax at front desk

## 2015-01-27 ENCOUNTER — Telehealth: Payer: Self-pay | Admitting: *Deleted

## 2015-01-27 NOTE — Telephone Encounter (Signed)
Patient called requesting to move appt with Dr. Fermin Schwab to July 2016. Patient agreeable to come 04/29/15 at 1:30pm.

## 2015-01-31 ENCOUNTER — Ambulatory Visit: Payer: Medicare Other | Admitting: Internal Medicine

## 2015-02-01 ENCOUNTER — Encounter: Payer: Self-pay | Admitting: Internal Medicine

## 2015-02-01 ENCOUNTER — Ambulatory Visit: Payer: Medicare Other | Attending: Internal Medicine | Admitting: Internal Medicine

## 2015-02-01 VITALS — BP 158/93 | HR 142 | Temp 98.3°F | Resp 16 | Ht 66.0 in | Wt 153.0 lb

## 2015-02-01 DIAGNOSIS — Z23 Encounter for immunization: Secondary | ICD-10-CM

## 2015-02-01 DIAGNOSIS — I10 Essential (primary) hypertension: Secondary | ICD-10-CM

## 2015-02-01 DIAGNOSIS — F411 Generalized anxiety disorder: Secondary | ICD-10-CM

## 2015-02-01 DIAGNOSIS — R000 Tachycardia, unspecified: Secondary | ICD-10-CM | POA: Insufficient documentation

## 2015-02-01 HISTORY — DX: Essential (primary) hypertension: I10

## 2015-02-01 HISTORY — DX: Tachycardia, unspecified: R00.0

## 2015-02-01 LAB — COMPLETE METABOLIC PANEL WITH GFR
ALK PHOS: 77 U/L (ref 39–117)
ALT: 10 U/L (ref 0–35)
AST: 15 U/L (ref 0–37)
Albumin: 4.5 g/dL (ref 3.5–5.2)
BILIRUBIN TOTAL: 0.5 mg/dL (ref 0.2–1.2)
BUN: 20 mg/dL (ref 6–23)
CO2: 29 mEq/L (ref 19–32)
CREATININE: 0.83 mg/dL (ref 0.50–1.10)
Calcium: 9.8 mg/dL (ref 8.4–10.5)
Chloride: 101 mEq/L (ref 96–112)
GFR, EST NON AFRICAN AMERICAN: 74 mL/min
GFR, Est African American: 85 mL/min
Glucose, Bld: 118 mg/dL — ABNORMAL HIGH (ref 70–99)
Potassium: 4.2 mEq/L (ref 3.5–5.3)
Sodium: 142 mEq/L (ref 135–145)
Total Protein: 8.2 g/dL (ref 6.0–8.3)

## 2015-02-01 MED ORDER — ALPRAZOLAM 0.25 MG PO TABS: 0.5000 mg | ORAL_TABLET | Freq: Two times a day (BID) | ORAL | Status: DC

## 2015-02-01 MED ORDER — METOPROLOL TARTRATE 25 MG PO TABS: 25.0000 mg | ORAL_TABLET | Freq: Two times a day (BID) | ORAL | Status: DC

## 2015-02-01 MED ORDER — LISINOPRIL-HYDROCHLOROTHIAZIDE 20-12.5 MG PO TABS
2.0000 | ORAL_TABLET | Freq: Every day | ORAL | Status: DC
Start: 2015-02-01 — End: 2015-04-19

## 2015-02-01 NOTE — Progress Notes (Signed)
Patient ID: Stephanie [name], female   DOB: 11-21-1945, 69 y.o.   MRN: [id number]   Stephanie [name], is a 69 y.o. female  CXK:[id number]  [id number]:[id number]  DOB - [birth year]  Chief Complaint  Patient presents with  . Follow-up  . Hypertension  . Anxiety  . Immunizations    PNA        Subjective:   Stephanie [name] is a 60 y.o. pleasant female here today for a follow up visit. Patient had a long history of abnormal uterine bleeding presumed to be secondary to uterine fibroids. However she was found to have a grade 1 endometrial carcinoma in August 2014 and underwent TAH BSO pelvic lymphadenectomy. Final pathology showed a stage I a grade 2 endometrial carcinoma and no adjuvant therapy was recommended. Surgery was performed on 07/21/2013.  Patient has been doing well since surgery.Pt comes in today for wellness visit. States she was told to come in for medication for her tachycardia. Denies chest pain or sob. BP elevated 158/93, HR 142.  She said her blood pressure is always elevated when she comes to the clinic , think she may have white coat syndrome.Patient is requesting PNA vaccine Patient has No headache, No abdominal pain - No Nausea, No new weakness tingling or numbness  Problem  Uncontrolled Hypertension  Tachycardia    ALLERGIES: Allergies  Allergen Reactions  . Iron Anaphylaxis    IV Iron  . Iron Dextran Anaphylaxis  . Penicillins Hives and Swelling    PT, STATED ALLERGIES TO ALL " CILLINS" Tongue swelling  . Amoxicillin Other (See Comments)    Unknown reaction  . Ampicillin Other (See Comments)    Unknown reaction  . Codeine Nausea And Vomiting  . Diamox [Acetazolamide] Nausea And Vomiting, Diarrhea and Nausea Only    Other reaction(s): Cramps (ALLERGY/intolerance) Severe nausea and vomitting  . Penicillin G Swelling and Hives    PAST MEDICAL HISTORY: Past Medical History  Diagnosis Date  . Glaucoma   . Anxiety   . PNA (pneumonia)   . Cataract   . Complication  of anesthesia   . PONV (postoperative nausea and vomiting)   . Hypertension   . Anemia   . Vaginal bleeding   . History of transfusion   . Fibroids   . Endometrial cancer   . Difficulty sleeping   . Blindness, legal     MEDICATIONS AT HOME: Prior to Admission medications   Medication Sig Start Date End Date Taking? Authorizing Provider  ALPRAZolam Duanne Moron) 0.25 MG tablet Take 2 tablets (0.5 mg total) by mouth 2 (two) times daily. 02/01/15  Yes Tresa Garter, MD  amLODipine (NORVASC) 10 MG tablet TAKE ONE TABLET BY MOUTH ONCE DAILY   Yes Linnie Delgrande E Emrick Hensch, MD  bimatoprost (LUMIGAN) 0.01 % SOLN Place 1 drop into both eyes at bedtime.   Yes Historical Provider, MD  brimonidine-timolol (COMBIGAN) 0.2-0.5 % ophthalmic solution Place 1 drop into both eyes every 12 (twelve) hours.   Yes Historical Provider, MD  cyclopentolate (CYCLODRYL,CYCLOGYL) 1 % ophthalmic solution Apply to eye. 05/04/14  Yes Historical Provider, MD  dorzolamide (TRUSOPT) 2 % ophthalmic solution  12/10/13  Yes Historical Provider, MD  lisinopril-hydrochlorothiazide (ZESTORETIC) 20-12.5 MG per tablet Take 2 tablets by mouth daily. 02/01/15  Yes Tresa Garter, MD  timolol (TIMOPTIC) 0.25 % ophthalmic solution  08/12/13  Yes Historical Provider, MD  VIGAMOX 0.5 % ophthalmic solution  11/20/13  Yes Historical Provider, MD  Difluprednate (DUREZOL) 0.05 % EMUL 1 drop.  Historical Provider, MD  docusate sodium (COLACE) 100 MG capsule Take 100 mg by mouth. 04/07/12   Historical Provider, MD  ketorolac (ACULAR) 0.5 % ophthalmic solution 1 drop.    Historical Provider, MD  metoprolol tartrate (LOPRESSOR) 25 MG tablet Take 1 tablet (25 mg total) by mouth 2 (two) times daily. 02/01/15   Tresa Garter, MD  neomycin-polymyxin-dexamethasone (MAXITROL) 0.1 % ophthalmic suspension Apply 1 drop to eye.    Historical Provider, MD  oxyCODONE-acetaminophen (ROXICET) 5-325 MG per tablet Take 1 tablet by mouth every 4 (four) hours  as needed for pain. Patient not taking: Reported on 09/27/2014 07/25/13   Lahoma Crocker, MD  permethrin (ACTICIN) 5 % cream Apply 1 application topically once. Apply topically and keep on for 12 hours then rinse off. Repeat in one week if needed Patient not taking: Reported on 09/27/2014 10/13/13   Tresa Garter, MD     Objective:   Filed Vitals:   02/01/15 1214  BP: 158/93  Pulse: 142  Temp: 98.3 F (36.8 C)  TempSrc: Oral  Resp: 16  Height: 5\' 6"  (1.676 m)  Weight: 153 lb (69.4 kg)  SpO2: 100%    Exam General appearance : Awake, alert, not in any distress. Speech Clear. Not toxic looking HEENT: Atraumatic and Normocephalic, pupils equally reactive to light and accomodation Neck: supple, no JVD. No cervical lymphadenopathy.  Chest:Good air entry bilaterally, no added sounds  CVS: S1 S2 regular, no murmurs.  Abdomen: Bowel sounds present, Non tender and not distended with no gaurding, rigidity or rebound. Extremities: B/L Lower Ext shows no edema, both legs are warm to touch Neurology: Awake alert, and oriented X 3, CN II-XII intact, Non focal Skin:No Rash  Data Review No results found for: HGBA1C   Assessment & Plan   1. Uncontrolled hypertension   Discontinue amlodipine and start metoprolol in view of uncontrolled hypertension and tachycardia - lisinopril-hydrochlorothiazide (ZESTORETIC) 20-12.5 MG per tablet; Take 2 tablets by mouth daily.  Dispense: 90 tablet; Refill: 3 - COMPLETE METABOLIC PANEL WITH GFR - metoprolol tartrate (LOPRESSOR) 25 MG tablet; Take 1 tablet (25 mg total) by mouth 2 (two) times daily.  Dispense: 180 tablet; Refill: 3  2. Generalized anxiety disorder  - ALPRAZolam (XANAX) 0.25 MG tablet; Take 2 tablets (0.5 mg total) by mouth 2 (two) times daily.  Dispense: 60 tablet; Refill: 0  3. Tachycardia  I will start patient on beta blocker, discontinue amlodipine - TSH - T4, Free - metoprolol tartrate (LOPRESSOR) 25 MG tablet; Take  1 tablet (25 mg total) by mouth 2 (two) times daily.  Dispense: 180 tablet; Refill: 3  Patient have been counseled extensively about nutrition and exercise Return in about 3 months (around 05/03/2015), or if symptoms worsen or fail to improve, for Follow up HTN, Generalized Anxiety Disorder.  The patient was given clear instructions to go to ER or return to medical center if symptoms don't improve, worsen or new problems develop. The patient verbalized understanding. The patient was told to call to get lab results if they haven't heard anything in the next week.   This note has been created with Surveyor, quantity. Any transcriptional errors are unintentional.    Angelica Chessman, MD, [place], [place], [place], [place] and [place], [place]   02/01/2015, 12:38 PM

## 2015-02-01 NOTE — Progress Notes (Signed)
Pt comes in today for wellness visit States she was told to come in for possible Beta-Blocker for tachycardia Denies chest pain or sob  BP elevated 158/93 142 Requesting PNA vaccine

## 2015-02-01 NOTE — Patient Instructions (Signed)
DASH Eating Plan DASH stands for "Dietary Approaches to Stop Hypertension." The DASH eating plan is a healthy eating plan that has been shown to reduce high blood pressure (hypertension). Additional health benefits may include reducing the risk of type 2 diabetes mellitus, heart disease, and stroke. The DASH eating plan may also help with weight loss. WHAT DO I NEED TO KNOW ABOUT THE DASH EATING PLAN? For the DASH eating plan, you will follow these general guidelines:  Choose foods with a percent daily value for sodium of less than 5% (as listed on the food label).  Use salt-free seasonings or herbs instead of table salt or sea salt.  Check with your health care provider or pharmacist before using salt substitutes.  Eat lower-sodium products, often labeled as "lower sodium" or "no salt added."  Eat fresh foods.  Eat more vegetables, fruits, and low-fat dairy products.  Choose whole grains. Look for the word "whole" as the first word in the ingredient list.  Choose fish and skinless chicken or turkey more often than red meat. Limit fish, poultry, and meat to 6 oz (170 g) each day.  Limit sweets, desserts, sugars, and sugary drinks.  Choose heart-healthy fats.  Limit cheese to 1 oz (28 g) per day.  Eat more home-cooked food and less restaurant, buffet, and fast food.  Limit fried foods.  Cook foods using methods other than frying.  Limit canned vegetables. If you do use them, rinse them well to decrease the sodium.  When eating at a restaurant, ask that your food be prepared with less salt, or no salt if possible. WHAT FOODS CAN I EAT? Seek help from a dietitian for individual calorie needs. Grains Whole grain or whole wheat bread. Brown rice. Whole grain or whole wheat pasta. Quinoa, bulgur, and whole grain cereals. Low-sodium cereals. Corn or whole wheat flour tortillas. Whole grain cornbread. Whole grain crackers. Low-sodium crackers. Vegetables Fresh or frozen vegetables  (raw, steamed, roasted, or grilled). Low-sodium or reduced-sodium tomato and vegetable juices. Low-sodium or reduced-sodium tomato sauce and paste. Low-sodium or reduced-sodium canned vegetables.  Fruits All fresh, canned (in natural juice), or frozen fruits. Meat and Other Protein Products Ground beef (85% or leaner), grass-fed beef, or beef trimmed of fat. Skinless chicken or turkey. Ground chicken or turkey. Pork trimmed of fat. All fish and seafood. Eggs. Dried beans, peas, or lentils. Unsalted nuts and seeds. Unsalted canned beans. Dairy Low-fat dairy products, such as skim or 1% milk, 2% or reduced-fat cheeses, low-fat ricotta or cottage cheese, or plain low-fat yogurt. Low-sodium or reduced-sodium cheeses. Fats and Oils Tub margarines without trans fats. Light or reduced-fat mayonnaise and salad dressings (reduced sodium). Avocado. Safflower, olive, or canola oils. Natural peanut or almond butter. Other Unsalted popcorn and pretzels. The items listed above may not be a complete list of recommended foods or beverages. Contact your dietitian for more options. WHAT FOODS ARE NOT RECOMMENDED? Grains White bread. White pasta. White rice. Refined cornbread. Bagels and croissants. Crackers that contain trans fat. Vegetables Creamed or fried vegetables. Vegetables in a cheese sauce. Regular canned vegetables. Regular canned tomato sauce and paste. Regular tomato and vegetable juices. Fruits Dried fruits. Canned fruit in light or heavy syrup. Fruit juice. Meat and Other Protein Products Fatty cuts of meat. Ribs, chicken wings, bacon, sausage, bologna, salami, chitterlings, fatback, hot dogs, bratwurst, and packaged luncheon meats. Salted nuts and seeds. Canned beans with salt. Dairy Whole or 2% milk, cream, half-and-half, and cream cheese. Whole-fat or sweetened yogurt. Full-fat   cheeses or blue cheese. Nondairy creamers and whipped toppings. Processed cheese, cheese spreads, or cheese  curds. Condiments Onion and garlic salt, seasoned salt, table salt, and sea salt. Canned and packaged gravies. Worcestershire sauce. Tartar sauce. Barbecue sauce. Teriyaki sauce. Soy sauce, including reduced sodium. Steak sauce. Fish sauce. Oyster sauce. Cocktail sauce. Horseradish. Ketchup and mustard. Meat flavorings and tenderizers. Bouillon cubes. Hot sauce. Tabasco sauce. Marinades. Taco seasonings. Relishes. Fats and Oils Butter, stick margarine, lard, shortening, ghee, and bacon fat. Coconut, palm kernel, or palm oils. Regular salad dressings. Other Pickles and olives. Salted popcorn and pretzels. The items listed above may not be a complete list of foods and beverages to avoid. Contact your dietitian for more information. WHERE CAN I FIND MORE INFORMATION? National Heart, Lung, and Blood Institute: www.nhlbi.nih.gov/health/health-topics/topics/dash/ Document Released: 09/20/2011 Document Revised: 02/15/2014 Document Reviewed: 08/05/2013 ExitCare Patient Information 2015 [employer]. This information is not intended to replace advice given to you by your health care provider. Make sure you discuss any questions you have with your health care provider. Hypertension Hypertension, commonly called high blood pressure, is when the force of blood pumping through your arteries is too strong. Your arteries are the blood vessels that carry blood from your heart throughout your body. A blood pressure reading consists of a higher number over a lower number, such as 110/72. The higher number (systolic) is the pressure inside your arteries when your heart pumps. The lower number (diastolic) is the pressure inside your arteries when your heart relaxes. Ideally you want your blood pressure below 120/80. Hypertension forces your heart to work harder to pump blood. Your arteries may become narrow or stiff. Having hypertension puts you at risk for heart disease, stroke, and other problems.  RISK  FACTORS Some risk factors for high blood pressure are controllable. Others are not.  Risk factors you cannot control include:   Race. You may be at higher risk if you are African American.  Age. Risk increases with age.  Gender. Men are at higher risk than women before age 45 years. After age 65, women are at higher risk than men. Risk factors you can control include:  Not getting enough exercise or physical activity.  Being overweight.  Getting too much fat, sugar, calories, or salt in your diet.  Drinking too much alcohol. SIGNS AND SYMPTOMS Hypertension does not usually cause signs or symptoms. Extremely high blood pressure (hypertensive crisis) may cause headache, anxiety, shortness of breath, and nosebleed. DIAGNOSIS  To check if you have hypertension, your health care provider will measure your blood pressure while you are seated, with your arm held at the level of your heart. It should be measured at least twice using the same arm. Certain conditions can cause a difference in blood pressure between your right and left arms. A blood pressure reading that is higher than normal on one occasion does not mean that you need treatment. If one blood pressure reading is high, ask your health care provider about having it checked again. TREATMENT  Treating high blood pressure includes making lifestyle changes and possibly taking medicine. Living a healthy lifestyle can help lower high blood pressure. You may need to change some of your habits. Lifestyle changes may include:  Following the DASH diet. This diet is high in fruits, vegetables, and whole grains. It is low in salt, red meat, and added sugars.  Getting at least 2 hours of brisk physical activity every week.  Losing weight if necessary.  Not smoking.  Limiting   alcoholic beverages.  Learning ways to reduce stress. If lifestyle changes are not enough to get your blood pressure under control, your health care provider may  prescribe medicine. You may need to take more than one. Work closely with your health care provider to understand the risks and benefits. HOME CARE INSTRUCTIONS  Have your blood pressure rechecked as directed by your health care provider.   Take medicines only as directed by your health care provider. Follow the directions carefully. Blood pressure medicines must be taken as prescribed. The medicine does not work as well when you skip doses. Skipping doses also puts you at risk for problems.   Do not smoke.   Monitor your blood pressure at home as directed by your health care provider. SEEK MEDICAL CARE IF:   You think you are having a reaction to medicines taken.  You have recurrent headaches or feel dizzy.  You have swelling in your ankles.  You have trouble with your vision. SEEK IMMEDIATE MEDICAL CARE IF:  You develop a severe headache or confusion.  You have unusual weakness, numbness, or feel faint.  You have severe chest or abdominal pain.  You vomit repeatedly.  You have trouble breathing. MAKE SURE YOU:   Understand these instructions.  Will watch your condition.  Will get help right away if you are not doing well or get worse. Document Released: 10/01/2005 Document Revised: 02/15/2014 Document Reviewed: 07/24/2013 ExitCare Patient Information 2015 [employer]. This information is not intended to replace advice given to you by your health care provider. Make sure you discuss any questions you have with your health care provider.  

## 2015-02-02 LAB — TSH: TSH: 1.327 u[IU]/mL (ref 0.350–4.500)

## 2015-02-02 LAB — T4, FREE: Free T4: 0.83 ng/dL (ref 0.80–1.80)

## 2015-02-07 ENCOUNTER — Telehealth: Payer: Self-pay

## 2015-02-07 NOTE — Telephone Encounter (Signed)
Patient not available Left message on voice mail to return our call 

## 2015-02-07 NOTE — Telephone Encounter (Signed)
Patient called is returning nurse's phone call to review results.

## 2015-02-07 NOTE — Telephone Encounter (Signed)
-----   Message from Tresa Garter, MD sent at 02/04/2015  3:31 PM EDT ----- Please inform patient that her laboratory test results are mostly within normal limits. Her thyroid functions are normal.

## 2015-03-11 ENCOUNTER — Ambulatory Visit: Payer: Medicare Other | Admitting: Gynecology

## 2015-04-08 ENCOUNTER — Ambulatory Visit: Payer: Medicare Other | Admitting: Gynecology

## 2015-04-11 ENCOUNTER — Telehealth: Payer: Self-pay | Admitting: General Practice

## 2015-04-11 DIAGNOSIS — F411 Generalized anxiety disorder: Secondary | ICD-10-CM

## 2015-04-11 NOTE — Telephone Encounter (Signed)
Patient called requesting medication refill for ALPRAZolam (XANAX) 0.25 MG tablet  Please assist

## 2015-04-12 ENCOUNTER — Other Ambulatory Visit: Payer: Self-pay | Admitting: Internal Medicine

## 2015-04-12 NOTE — Telephone Encounter (Signed)
Pt called requesting medication refill for ALPRAZolam (XANAX) 0.25 MG tablet Please f/u

## 2015-04-19 ENCOUNTER — Other Ambulatory Visit: Payer: Self-pay | Admitting: Internal Medicine

## 2015-04-19 NOTE — Telephone Encounter (Signed)
See previous note. Message forwarded to Dr. Doreene Burke for refill.

## 2015-04-19 NOTE — Telephone Encounter (Signed)
Pt called requesting medication refill for ALPRAZolam (XANAX) 0.25 MG tablet Please f/u

## 2015-04-22 ENCOUNTER — Telehealth: Payer: Self-pay | Admitting: Internal Medicine

## 2015-04-22 NOTE — Telephone Encounter (Signed)
Dr Doreene Burke told pt to discontinue amLODipine (NORVASC) 10 MG tablet and changed her to metoprolol tartrate (LOPRESSOR) 25 MG tablet to see if this would improve/stabalize her bp. Dr Doreene Burke wanted to check her bp in her next 3 mo f/u to check if the new medication is working. Dr Doreene Burke does not have anything available till Aug 4th almost 3 weeks after she needs to get her bp checked and is concerned and hoping to be seen sooner. Do you recommend that we try to fit this pt in to be seen sooner? Please follow up with pt. Thank you.

## 2015-04-25 ENCOUNTER — Other Ambulatory Visit: Payer: Self-pay

## 2015-04-25 DIAGNOSIS — F411 Generalized anxiety disorder: Secondary | ICD-10-CM

## 2015-04-25 MED ORDER — ALPRAZOLAM 0.25 MG PO TABS: 0.5000 mg | ORAL_TABLET | Freq: Two times a day (BID) | ORAL | Status: DC

## 2015-04-25 NOTE — Telephone Encounter (Signed)
Patient called requesting xanax prescription. Patient verified date of birth. Prescription routed to Dr. Doreene Burke.  Patient questioned nurse call back in April. Nurse researched chart. Patient aware of mostly normal lab results with a normal thyroid function.

## 2015-04-25 NOTE — Telephone Encounter (Signed)
Nurse called patient, patient verified date of birth. Patient aware of prescription at front office to be picked up. Patient has appointment on May 19, 2015 to be seen. Nurse advised patient to call [place] if blood pressure get high. Patient reports blood pressures being 124/73 and 134/80 recently.  Patient voices understanding and has no further questions at this time.

## 2015-04-29 ENCOUNTER — Encounter: Payer: Self-pay | Admitting: Gynecology

## 2015-04-29 ENCOUNTER — Other Ambulatory Visit (HOSPITAL_COMMUNITY)
Admission: RE | Admit: 2015-04-29 | Discharge: 2015-04-29 | Disposition: A | Discharge status: Home / Self Care | Payer: Medicare Other | Source: Ambulatory Visit | Attending: Gynecology | Admitting: Gynecology

## 2015-04-29 ENCOUNTER — Ambulatory Visit: Payer: Medicare Other | Attending: Gynecology | Admitting: Gynecology

## 2015-04-29 VITALS — BP 190/98 | HR 80 | Temp 97.4°F | Resp 20 | Ht 66.0 in | Wt 157.2 lb

## 2015-04-29 DIAGNOSIS — Z01411 Encounter for gynecological examination (general) (routine) with abnormal findings: Secondary | ICD-10-CM | POA: Diagnosis present

## 2015-04-29 DIAGNOSIS — Z8542 Personal history of malignant neoplasm of other parts of uterus: Secondary | ICD-10-CM | POA: Diagnosis not present

## 2015-04-29 DIAGNOSIS — C541 Malignant neoplasm of endometrium: Secondary | ICD-10-CM | POA: Diagnosis present

## 2015-04-29 NOTE — Addendum Note (Signed)
Addended by: Joylene John D on: 04/29/2015 03:34 PM   Modules accepted: Orders

## 2015-04-29 NOTE — Progress Notes (Signed)
Consult Note: Gyn-Onc   Stephanie [name] 69 y.o. female  Chief Complaint  Patient presents with  . endometrial cancer    follow-up    Assessment : Stage IA grade 2 endometrial carcinoma October 2014 no evidence of disease.  Plan: A Pap smear is obtained. She returned to see me in 6 months for followup.   Interval History: The patient returns today as previously scheduled. Since her last visit she's done well. She denies any GI GU or pelvic symptoms. Her functional status is excellent.    HPI: Patient was initially referred with a long history of abnormal uterine bleeding. She presumed is a secondary uterine fibroids. However she was found to have a grade 1 endometrial carcinoma in August 2014 and underwent TAH BSO pelvic lymphadenectomy. Final pathology showed a stage I a grade 2 endometrial carcinoma and no adjuvant therapy was recommended. Surgery was performed on 07/21/2013.  Final pathology showed a grade 2 lesion which was superficially invasive. All lymph nodes were negative. No adjuvant therapy was recommended.  Review of Systems:10 point review of systems is negative except as noted in interval history.   Vitals: Blood pressure 190/98, pulse 80, temperature 97.4 F (36.3 C), temperature source Oral, resp. rate 20, height 5\' 6"  (1.676 m), weight 157 lb 3.2 oz (71.305 kg).  Physical Exam: General : The patient is a healthy woman in no acute distress.  HEENT: normocephalic, extraoccular movements normal; neck is supple without thyromegally  Lynphnodes: Supraclavicular and inguinal nodes not enlarged  Abdomen: Soft, non-tender, no ascites, no organomegally, no masses, no hernias , midline incision is healing well. Pelvic:  EGBUS: Normal female  Vagina: Normal, no lesions , vaginal cuff is well healed. Urethra and Bladder: Normal, non-tender  Cervix: Surgically absent  Uterus: Surgically absent  Bi-manual examination: Non-tender; no adenxal masses or nodularity  Rectal:  normal sphincter tone, no masses, no blood  Lower extremities: No edema or varicosities. Normal range of motion      Allergies  Allergen Reactions  . Iron Anaphylaxis    IV Iron  . Iron Dextran Anaphylaxis  . Penicillins Hives and Swelling    PT, STATED ALLERGIES TO ALL " CILLINS" Tongue swelling  . Amoxicillin Other (See Comments)    Unknown reaction  . Ampicillin Other (See Comments)    Unknown reaction  . Codeine Nausea And Vomiting  . Diamox [Acetazolamide] Nausea And Vomiting, Diarrhea and Nausea Only    Other reaction(s): Cramps (ALLERGY/intolerance) Severe nausea and vomitting  . Penicillin G Swelling and Hives  . Other Nausea Only    Past Medical History  Diagnosis Date  . Glaucoma   . Anxiety   . PNA (pneumonia)   . Cataract   . Complication of anesthesia   . PONV (postoperative nausea and vomiting)   . Hypertension   . Anemia   . Vaginal bleeding   . History of transfusion   . Fibroids   . Endometrial cancer   . Difficulty sleeping   . Blindness, legal     Past Surgical History  Procedure Laterality Date  . Dilation and curettage of uterus    . Laparotomy N/A 07/21/2013    Procedure: EXPLORATORY LAPAROTOMY/ PELVIC LYMPHADENECTOMY   ;  Surgeon: Alvino Chapel, MD;  Location: [facility];  Service: Gynecology;  Laterality: N/A;  . Abdominal hysterectomy N/A 07/21/2013    Procedure: TOTAL HYSTERECTOMY ABDOMINAL ;  Surgeon: Alvino Chapel, MD;  Location: [facility];  Service: Gynecology;  Laterality: N/A;  .  Salpingoophorectomy Bilateral 07/21/2013    Procedure: BILATERAL SALPINGO OOPHORECTOMY;  Surgeon: Alvino Chapel, MD;  Location: [facility];  Service: Gynecology;  Laterality: Bilateral;    Current Outpatient Prescriptions  Medication Sig Dispense Refill  . ALPRAZolam (XANAX) 0.25 MG tablet Take 2 tablets (0.5 mg total) by mouth 2 (two) times daily. 60 tablet 0  . brimonidine-timolol (COMBIGAN) 0.2-0.5 % ophthalmic solution Place 1 drop  into both eyes every 12 (twelve) hours.    . cyclopentolate (CYCLODRYL,CYCLOGYL) 1 % ophthalmic solution Place 1 drop into the right eye 2 (two) times daily.     . dorzolamide (TRUSOPT) 2 % ophthalmic solution Place 1 drop into the left eye 2 (two) times daily.     Marland Kitchen latanoprost (XALATAN) 0.005 % ophthalmic solution Place 1 drop into the left eye at bedtime.     Marland Kitchen lisinopril-hydrochlorothiazide (PRINZIDE,ZESTORETIC) 20-12.5 MG per tablet TAKE 2 TABLETS BY MOUTH DAILY 90 tablet 0  . metoprolol tartrate (LOPRESSOR) 25 MG tablet Take 1 tablet (25 mg total) by mouth 2 (two) times daily. 180 tablet 3  . neomycin-polymyxin-dexamethasone (MAXITROL) 0.1 % ophthalmic suspension Apply 1 drop to eye.     No current facility-administered medications for this visit.    History   Social History  . Marital Status: Divorced    Spouse Name: N/A  . Number of Children: N/A  . Years of Education: N/A   Occupational History  . Not on file.   Social History Main Topics  . Smoking status: Never Smoker   . Smokeless tobacco: Never Used  . Alcohol Use: No  . Drug Use: No  . Sexual Activity: Not Currently    Birth Control/ Protection: None   Other Topics Concern  . Not on file   Social History Narrative    Family History  Problem Relation Age of Onset  . Hypertension Father   . Vision loss Maternal Grandmother       Alvino Chapel, MD 04/29/2015, 2:31 PM

## 2015-05-04 LAB — CYTOLOGY - PAP

## 2015-05-06 ENCOUNTER — Telehealth: Payer: Self-pay | Admitting: *Deleted

## 2015-05-06 NOTE — Telephone Encounter (Signed)
Per Joylene John, NP patient notified of normal pap smear. Patient appreciative of call.

## 2015-05-19 ENCOUNTER — Ambulatory Visit: Payer: Medicare Other | Attending: Internal Medicine | Admitting: Internal Medicine

## 2015-05-19 ENCOUNTER — Encounter: Payer: Self-pay | Admitting: Internal Medicine

## 2015-05-19 VITALS — BP 182/94 | HR 112 | Temp 98.4°F | Resp 20 | Wt 156.0 lb

## 2015-05-19 DIAGNOSIS — H548 Legal blindness, as defined in USA: Secondary | ICD-10-CM | POA: Diagnosis not present

## 2015-05-19 DIAGNOSIS — R000 Tachycardia, unspecified: Secondary | ICD-10-CM | POA: Diagnosis not present

## 2015-05-19 DIAGNOSIS — I10 Essential (primary) hypertension: Secondary | ICD-10-CM | POA: Insufficient documentation

## 2015-05-19 DIAGNOSIS — Z79899 Other long term (current) drug therapy: Secondary | ICD-10-CM | POA: Diagnosis not present

## 2015-05-19 DIAGNOSIS — F419 Anxiety disorder, unspecified: Secondary | ICD-10-CM | POA: Insufficient documentation

## 2015-05-19 DIAGNOSIS — F411 Generalized anxiety disorder: Secondary | ICD-10-CM | POA: Insufficient documentation

## 2015-05-19 MED ORDER — LISINOPRIL-HYDROCHLOROTHIAZIDE 20-12.5 MG PO TABS: 2.0000 | ORAL_TABLET | Freq: Every day | ORAL | Status: DC

## 2015-05-19 MED ORDER — AMLODIPINE BESYLATE 5 MG PO TABS: 5.0000 mg | ORAL_TABLET | Freq: Every day | ORAL | Status: DC

## 2015-05-19 NOTE — Progress Notes (Signed)
Patient ID: Stephanie [name], female   DOB: 07-Apr-1946, 69 y.o.   MRN: [id number]   Stephanie [name], is a 69 y.o. female  NKN:[id number]  FXT:[id number]  DOB - [birth year]  Chief Complaint  Patient presents with  . Follow-up    HTN        Subjective:   Stephanie [name] is a 69 y.o. female here today for a follow up visit. Patient has multiple medical conditions which include hypertension, tachycardia, generalized anxiety, glaucoma with recent eye surgery, legally blind. She is here today from medication review and routine follow-up. Patient has white coat syndrome but she reports occasional high blood pressure at home as well. She remembers when she was on amlodipine, blood pressure was usually normal. She has no complaint today. Her medications are listed below. Patient has No headache, No chest pain, No abdominal pain - No Nausea, No new weakness tingling or numbness, No Cough - SOB.  No problems updated.  ALLERGIES: Allergies  Allergen Reactions  . Iron Anaphylaxis    IV Iron  . Iron Dextran Anaphylaxis  . Penicillins Hives and Swelling    PT, STATED ALLERGIES TO ALL " CILLINS" Tongue swelling  . Amoxicillin Other (See Comments)    Unknown reaction  . Ampicillin Other (See Comments)    Unknown reaction  . Codeine Nausea And Vomiting  . Diamox [Acetazolamide] Nausea And Vomiting, Diarrhea and Nausea Only    Other reaction(s): Cramps (ALLERGY/intolerance) Severe nausea and vomitting  . Penicillin G Swelling and Hives  . Other Nausea Only    PAST MEDICAL HISTORY: Past Medical History  Diagnosis Date  . Glaucoma   . Anxiety   . PNA (pneumonia)   . Cataract   . Complication of anesthesia   . PONV (postoperative nausea and vomiting)   . Hypertension   . Anemia   . Vaginal bleeding   . History of transfusion   . Fibroids   . Endometrial cancer   . Difficulty sleeping   . Blindness, legal     MEDICATIONS AT HOME: Prior to Admission medications   Medication Sig  Start Date End Date Taking? Authorizing Provider  ALPRAZolam Duanne Moron) 0.25 MG tablet Take 2 tablets (0.5 mg total) by mouth 2 (two) times daily. 04/25/15  Yes Johnnae Impastato E Doreene Burke, MD  brimonidine-timolol (COMBIGAN) 0.2-0.5 % ophthalmic solution Place 1 drop into both eyes every 12 (twelve) hours.   Yes Historical Provider, MD  cyclopentolate (CYCLODRYL,CYCLOGYL) 1 % ophthalmic solution Place 1 drop into the right eye 2 (two) times daily.  05/04/14  Yes Historical Provider, MD  dorzolamide (TRUSOPT) 2 % ophthalmic solution Place 1 drop into the left eye 2 (two) times daily.  12/10/13  Yes Historical Provider, MD  latanoprost (XALATAN) 0.005 % ophthalmic solution Place 1 drop into the left eye at bedtime.  12/01/14 12/01/15 Yes Historical Provider, MD  lisinopril-hydrochlorothiazide (PRINZIDE,ZESTORETIC) 20-12.5 MG per tablet Take 2 tablets by mouth daily. 05/19/15  Yes Tresa Garter, MD  metoprolol tartrate (LOPRESSOR) 25 MG tablet Take 1 tablet (25 mg total) by mouth 2 (two) times daily. 02/01/15  Yes Tresa Garter, MD  neomycin-polymyxin-dexamethasone (MAXITROL) 0.1 % ophthalmic suspension Apply 1 drop to eye.   Yes Historical Provider, MD  amLODipine (NORVASC) 5 MG tablet Take 1 tablet (5 mg total) by mouth daily. 05/19/15   Tresa Garter, MD  dorzolamide-timolol (COSOPT) 22.3-6.8 MG/ML ophthalmic solution  05/02/15   Historical Provider, MD     Objective:   Filed Vitals:   05/19/15  1204  BP: 182/94  Pulse: 112  Temp: 98.4 F (36.9 C)  TempSrc: Oral  Resp: 20  Weight: 156 lb (70.761 kg)  SpO2: 98%    Exam General appearance : Awake, alert, not in any distress. Speech Clear. Not toxic looking HEENT: Atraumatic and Normocephalic, pupils equally reactive to light and accomodation Neck: supple, no JVD. No cervical lymphadenopathy.  Chest:Good air entry bilaterally, no added sounds  CVS: S1 S2 regular, tachycardia, no murmurs.  Abdomen: Bowel sounds present, Non tender and not  distended with no gaurding, rigidity or rebound. Extremities: B/L Lower Ext shows no edema, both legs are warm to touch Neurology: Awake alert, and oriented X 3, CN II-XII intact, Non focal Skin:No Rash  Data Review No results found for: HGBA1C   Assessment & Plan   1. Uncontrolled hypertension: Mostly systolic hypertension  Add - amLODipine (NORVASC) 5 MG tablet; Take 1 tablet (5 mg total) by mouth daily.  Dispense: 90 tablet; Refill: 3  Continue - lisinopril-hydrochlorothiazide (PRINZIDE,ZESTORETIC) 20-12.5 MG per tablet; Take 2 tablets by mouth daily.  Dispense: 180 tablet; Refill: 3  - We have discussed target BP range and blood pressure goal - I have advised patient to check BP regularly and to call us back or report to clinic if the numbers are consistently higher than 140/90  - We discussed the importance of compliance with medical therapy and DASH diet recommended, consequences of uncontrolled hypertension discussed.  - continue current BP medications  2. Generalized anxiety disorder   Continue Xanax  3. Tachycardia  Continue Metoprolol  Patient have been counseled extensively about nutrition and exercise  Return in about 2 weeks (around 06/02/2015) for BP Check, Nurse Visit.  The patient was given clear instructions to go to ER or return to medical center if symptoms don't improve, worsen or new problems develop. The patient verbalized understanding. The patient was told to call to get lab results if they haven't heard anything in the next week.   This note has been created with Surveyor, quantity. Any transcriptional errors are unintentional.    Angelica Chessman, MD, [place], [place], [place], [facility] [place], [place]   05/19/2015, 12:31 PM

## 2015-05-19 NOTE — Progress Notes (Signed)
Here for a f/u on HTN BP today is 199/94... Hx of anxiety and reports "white coat syndrome" Brought in a record of her BP this week... All WNL Alert, no acute distress.

## 2015-05-19 NOTE — Patient Instructions (Signed)
DASH Eating Plan DASH stands for "Dietary Approaches to Stop Hypertension." The DASH eating plan is a healthy eating plan that has been shown to reduce high blood pressure (hypertension). Additional health benefits may include reducing the risk of type 2 diabetes mellitus, heart disease, and stroke. The DASH eating plan may also help with weight loss. WHAT DO I NEED TO KNOW ABOUT THE DASH EATING PLAN? For the DASH eating plan, you will follow these general guidelines:  Choose foods with a percent daily value for sodium of less than 5% (as listed on the food label).  Use salt-free seasonings or herbs instead of table salt or sea salt.  Check with your health care provider or pharmacist before using salt substitutes.  Eat lower-sodium products, often labeled as "lower sodium" or "no salt added."  Eat fresh foods.  Eat more vegetables, fruits, and low-fat dairy products.  Choose whole grains. Look for the word "whole" as the first word in the ingredient list.  Choose fish and skinless chicken or turkey more often than red meat. Limit fish, poultry, and meat to 6 oz (170 g) each day.  Limit sweets, desserts, sugars, and sugary drinks.  Choose heart-healthy fats.  Limit cheese to 1 oz (28 g) per day.  Eat more home-cooked food and less restaurant, buffet, and fast food.  Limit fried foods.  Cook foods using methods other than frying.  Limit canned vegetables. If you do use them, rinse them well to decrease the sodium.  When eating at a restaurant, ask that your food be prepared with less salt, or no salt if possible. WHAT FOODS CAN I EAT? Seek help from a dietitian for individual calorie needs. Grains Whole grain or whole wheat bread. Brown rice. Whole grain or whole wheat pasta. Quinoa, bulgur, and whole grain cereals. Low-sodium cereals. Corn or whole wheat flour tortillas. Whole grain cornbread. Whole grain crackers. Low-sodium crackers. Vegetables Fresh or frozen vegetables  (raw, steamed, roasted, or grilled). Low-sodium or reduced-sodium tomato and vegetable juices. Low-sodium or reduced-sodium tomato sauce and paste. Low-sodium or reduced-sodium canned vegetables.  Fruits All fresh, canned (in natural juice), or frozen fruits. Meat and Other Protein Products Ground beef (85% or leaner), grass-fed beef, or beef trimmed of fat. Skinless chicken or turkey. Ground chicken or turkey. Pork trimmed of fat. All fish and seafood. Eggs. Dried beans, peas, or lentils. Unsalted nuts and seeds. Unsalted canned beans. Dairy Low-fat dairy products, such as skim or 1% milk, 2% or reduced-fat cheeses, low-fat ricotta or cottage cheese, or plain low-fat yogurt. Low-sodium or reduced-sodium cheeses. Fats and Oils Tub margarines without trans fats. Light or reduced-fat mayonnaise and salad dressings (reduced sodium). Avocado. Safflower, olive, or canola oils. Natural peanut or almond butter. Other Unsalted popcorn and pretzels. The items listed above may not be a complete list of recommended foods or beverages. Contact your dietitian for more options. WHAT FOODS ARE NOT RECOMMENDED? Grains White bread. White pasta. White rice. Refined cornbread. Bagels and croissants. Crackers that contain trans fat. Vegetables Creamed or fried vegetables. Vegetables in a cheese sauce. Regular canned vegetables. Regular canned tomato sauce and paste. Regular tomato and vegetable juices. Fruits Dried fruits. Canned fruit in light or heavy syrup. Fruit juice. Meat and Other Protein Products Fatty cuts of meat. Ribs, chicken wings, bacon, sausage, bologna, salami, chitterlings, fatback, hot dogs, bratwurst, and packaged luncheon meats. Salted nuts and seeds. Canned beans with salt. Dairy Whole or 2% milk, cream, half-and-half, and cream cheese. Whole-fat or sweetened yogurt. Full-fat   cheeses or blue cheese. Nondairy creamers and whipped toppings. Processed cheese, cheese spreads, or cheese  curds. Condiments Onion and garlic salt, seasoned salt, table salt, and sea salt. Canned and packaged gravies. Worcestershire sauce. Tartar sauce. Barbecue sauce. Teriyaki sauce. Soy sauce, including reduced sodium. Steak sauce. Fish sauce. Oyster sauce. Cocktail sauce. Horseradish. Ketchup and mustard. Meat flavorings and tenderizers. Bouillon cubes. Hot sauce. Tabasco sauce. Marinades. Taco seasonings. Relishes. Fats and Oils Butter, stick margarine, lard, shortening, ghee, and bacon fat. Coconut, palm kernel, or palm oils. Regular salad dressings. Other Pickles and olives. Salted popcorn and pretzels. The items listed above may not be a complete list of foods and beverages to avoid. Contact your dietitian for more information. WHERE CAN I FIND MORE INFORMATION? National Heart, Lung, and Blood Institute: www.nhlbi.nih.gov/health/health-topics/topics/dash/ Document Released: 09/20/2011 Document Revised: 02/15/2014 Document Reviewed: 08/05/2013 ExitCare Patient Information 2015 [employer]. This information is not intended to replace advice given to you by your health care provider. Make sure you discuss any questions you have with your health care provider. Hypertension Hypertension, commonly called high blood pressure, is when the force of blood pumping through your arteries is too strong. Your arteries are the blood vessels that carry blood from your heart throughout your body. A blood pressure reading consists of a higher number over a lower number, such as 110/72. The higher number (systolic) is the pressure inside your arteries when your heart pumps. The lower number (diastolic) is the pressure inside your arteries when your heart relaxes. Ideally you want your blood pressure below 120/80. Hypertension forces your heart to work harder to pump blood. Your arteries may become narrow or stiff. Having hypertension puts you at risk for heart disease, stroke, and other problems.  RISK  FACTORS Some risk factors for high blood pressure are controllable. Others are not.  Risk factors you cannot control include:   Race. You may be at higher risk if you are African American.  Age. Risk increases with age.  Gender. Men are at higher risk than women before age 45 years. After age 65, women are at higher risk than men. Risk factors you can control include:  Not getting enough exercise or physical activity.  Being overweight.  Getting too much fat, sugar, calories, or salt in your diet.  Drinking too much alcohol. SIGNS AND SYMPTOMS Hypertension does not usually cause signs or symptoms. Extremely high blood pressure (hypertensive crisis) may cause headache, anxiety, shortness of breath, and nosebleed. DIAGNOSIS  To check if you have hypertension, your health care provider will measure your blood pressure while you are seated, with your arm held at the level of your heart. It should be measured at least twice using the same arm. Certain conditions can cause a difference in blood pressure between your right and left arms. A blood pressure reading that is higher than normal on one occasion does not mean that you need treatment. If one blood pressure reading is high, ask your health care provider about having it checked again. TREATMENT  Treating high blood pressure includes making lifestyle changes and possibly taking medicine. Living a healthy lifestyle can help lower high blood pressure. You may need to change some of your habits. Lifestyle changes may include:  Following the DASH diet. This diet is high in fruits, vegetables, and whole grains. It is low in salt, red meat, and added sugars.  Getting at least 2 hours of brisk physical activity every week.  Losing weight if necessary.  Not smoking.  Limiting   alcoholic beverages.  Learning ways to reduce stress. If lifestyle changes are not enough to get your blood pressure under control, your health care provider may  prescribe medicine. You may need to take more than one. Work closely with your health care provider to understand the risks and benefits. HOME CARE INSTRUCTIONS  Have your blood pressure rechecked as directed by your health care provider.   Take medicines only as directed by your health care provider. Follow the directions carefully. Blood pressure medicines must be taken as prescribed. The medicine does not work as well when you skip doses. Skipping doses also puts you at risk for problems.   Do not smoke.   Monitor your blood pressure at home as directed by your health care provider. SEEK MEDICAL CARE IF:   You think you are having a reaction to medicines taken.  You have recurrent headaches or feel dizzy.  You have swelling in your ankles.  You have trouble with your vision. SEEK IMMEDIATE MEDICAL CARE IF:  You develop a severe headache or confusion.  You have unusual weakness, numbness, or feel faint.  You have severe chest or abdominal pain.  You vomit repeatedly.  You have trouble breathing. MAKE SURE YOU:   Understand these instructions.  Will watch your condition.  Will get help right away if you are not doing well or get worse. Document Released: 10/01/2005 Document Revised: 02/15/2014 Document Reviewed: 07/24/2013 ExitCare Patient Information 2015 [employer]. This information is not intended to replace advice given to you by your health care provider. Make sure you discuss any questions you have with your health care provider.  

## 2015-06-02 ENCOUNTER — Ambulatory Visit: Payer: Medicare Other | Attending: Internal Medicine | Admitting: Pharmacist

## 2015-06-02 VITALS — BP 138/88 | HR 85

## 2015-06-02 DIAGNOSIS — R000 Tachycardia, unspecified: Secondary | ICD-10-CM

## 2015-06-02 DIAGNOSIS — Z79899 Other long term (current) drug therapy: Secondary | ICD-10-CM | POA: Diagnosis not present

## 2015-06-02 DIAGNOSIS — I10 Essential (primary) hypertension: Secondary | ICD-10-CM | POA: Diagnosis not present

## 2015-06-02 NOTE — Progress Notes (Signed)
Patient ID: Stephanie [name], female   DOB: 1946/08/15, 69 y.o.   MRN: [id number]   Patient arrives in good spirits with her husband but has noticeable anxiety about her BP.  She presents to the clinic for hypertension evaluation.   Patient reports that her husband will play music off of his phone for her and that helps her to calm down. She reports that she often gets anxious when coming to any doctor's office and she has been told all of her life that she has white coat hypertension.    Patient reports adherence with medications.  Current BP Medications include:  Metoprolol tartrate 25 mg BID, amlodipine 5 mg daily, lisinopril/hctz 20/12.5 mg daily   SM BP in AM 8/16: 110/69 HR 63 8/17: 115/73 HR 70  8/18: 119/73 HR 64  Pt has had BP meds today.  O:   Last 3 Office BP readings: BP Readings from Last 3 Encounters:  06/02/15 138/88  05/19/15 182/94  04/29/15 190/98  06/02/15 HR 86 BMET    Component Value Date/Time   NA 142 02/01/2015 1238   NA 142 07/09/2013 1144   K 4.2 02/01/2015 1238   K 3.7 07/09/2013 1144   CL 101 02/01/2015 1238   CO2 29 02/01/2015 1238   CO2 23 07/09/2013 1144   GLUCOSE 118* 02/01/2015 1238   GLUCOSE 115 07/09/2013 1144   BUN 20 02/01/2015 1238   BUN 14.4 07/09/2013 1144   CREATININE 0.83 02/01/2015 1238   CREATININE 1.09 07/25/2013 0520   CREATININE 0.8 07/09/2013 1144   CALCIUM 9.8 02/01/2015 1238   CALCIUM 9.3 07/09/2013 1144   GFRNONAA 74 02/01/2015 1238   GFRNONAA 52* 07/25/2013 0520   GFRAA 85 02/01/2015 1238   GFRAA 60* 07/25/2013 0520    A/P:  History of hypertension which is currently is controlled on current medications.  Pt has noticeable white coat hypertension upon initial exam but BP was at goal following pt relaxing for 5 mins with music. Pulse also improved to 89 after relaxation.  Self monitored BP at home readings were well below goal of <140/90. No recommendations for changes to medications at this time.    Results  reviewed and written information provided.   F/U Clinic Visit with Dr. Doreene Burke in 3 months.  Total time in face-to-face counseling 25 minutes.  Patient seen with Bennye Alm, PharmD, Pharmacy Resident

## 2015-06-02 NOTE — Patient Instructions (Signed)
Thank you for coming in today. Please continue to take your medications and check your BP at home. Please notify the clinic if you have multiple BP readings > 140/90.

## 2015-07-25 ENCOUNTER — Other Ambulatory Visit: Payer: Self-pay

## 2015-07-25 DIAGNOSIS — F411 Generalized anxiety disorder: Secondary | ICD-10-CM

## 2015-07-25 NOTE — Telephone Encounter (Signed)
Patient called nurse requesting xanax refill.  Nurse will send message to provider.

## 2015-07-26 MED ORDER — ALPRAZOLAM 0.25 MG PO TABS: 0.5000 mg | ORAL_TABLET | Freq: Two times a day (BID) | ORAL | Status: DC

## 2015-07-28 ENCOUNTER — Ambulatory Visit: Payer: Medicare Other | Attending: Internal Medicine | Admitting: Pharmacist

## 2015-07-28 VITALS — HR 100

## 2015-07-28 DIAGNOSIS — Z23 Encounter for immunization: Secondary | ICD-10-CM | POA: Insufficient documentation

## 2015-07-28 MED ORDER — INFLUENZA VAC SPLIT QUAD 0.5 ML IM SUSY
0.5000 mL | PREFILLED_SYRINGE | Freq: Once | INTRAMUSCULAR | Status: AC
  Administered 2015-07-28: 0.5 mL via INTRAMUSCULAR

## 2015-07-28 NOTE — Progress Notes (Signed)
Influenza vaccination administered  Patient also provided with paper prescription for Xanax, signed by Dr. Doreene Burke.   Nicoletta Ba, PharmD, BCPS, [facility] [phone]

## 2015-07-28 NOTE — Patient Instructions (Signed)

## 2015-09-26 ENCOUNTER — Other Ambulatory Visit: Payer: Self-pay

## 2015-09-26 DIAGNOSIS — F411 Generalized anxiety disorder: Secondary | ICD-10-CM

## 2015-09-26 MED ORDER — ALPRAZOLAM 0.25 MG PO TABS: 0.5000 mg | ORAL_TABLET | Freq: Two times a day (BID) | ORAL | Status: DC

## 2015-09-26 NOTE — Telephone Encounter (Signed)
Nurse called patient, patient verified date of birth. Nurse called patient due to patient leaving voicemail for nurse. Patient needed xanax refilled.  Patient aware of medication refill request being sent to provider. Patient voices understanding and has no further questions at this time.

## 2015-12-09 ENCOUNTER — Ambulatory Visit: Payer: Medicare Other | Admitting: Gynecology

## 2015-12-27 ENCOUNTER — Telehealth: Payer: Self-pay | Admitting: Internal Medicine

## 2015-12-27 NOTE — Telephone Encounter (Signed)
Pt. Called requesting a med refill on ALPRAZolam (XANAX) 0.25 MG tablet. Please f/u with pt.

## 2015-12-30 ENCOUNTER — Other Ambulatory Visit: Payer: Self-pay | Admitting: *Deleted

## 2015-12-30 DIAGNOSIS — F411 Generalized anxiety disorder: Secondary | ICD-10-CM

## 2015-12-30 MED ORDER — ALPRAZOLAM 0.25 MG PO TABS: 0.5000 mg | ORAL_TABLET | Freq: Two times a day (BID) | ORAL | Status: DC

## 2015-12-30 NOTE — Telephone Encounter (Signed)
Patients medication was last filled on 12/12/169. Patient is requesting a Xanax refill.

## 2016-01-06 ENCOUNTER — Encounter: Payer: Self-pay | Admitting: Gynecology

## 2016-01-06 ENCOUNTER — Ambulatory Visit: Payer: Medicare Other | Attending: Gynecology | Admitting: Gynecology

## 2016-01-06 VITALS — BP 163/95 | HR 116 | Temp 97.8°F | Resp 19 | Wt 154.1 lb

## 2016-01-06 DIAGNOSIS — Z880 Allergy status to penicillin: Secondary | ICD-10-CM | POA: Diagnosis not present

## 2016-01-06 DIAGNOSIS — I10 Essential (primary) hypertension: Secondary | ICD-10-CM | POA: Insufficient documentation

## 2016-01-06 DIAGNOSIS — D649 Anemia, unspecified: Secondary | ICD-10-CM | POA: Diagnosis not present

## 2016-01-06 DIAGNOSIS — H409 Unspecified glaucoma: Secondary | ICD-10-CM | POA: Diagnosis not present

## 2016-01-06 DIAGNOSIS — F419 Anxiety disorder, unspecified: Secondary | ICD-10-CM | POA: Insufficient documentation

## 2016-01-06 DIAGNOSIS — Z08 Encounter for follow-up examination after completed treatment for malignant neoplasm: Secondary | ICD-10-CM | POA: Insufficient documentation

## 2016-01-06 DIAGNOSIS — Z8542 Personal history of malignant neoplasm of other parts of uterus: Secondary | ICD-10-CM | POA: Insufficient documentation

## 2016-01-06 DIAGNOSIS — C541 Malignant neoplasm of endometrium: Secondary | ICD-10-CM

## 2016-01-06 DIAGNOSIS — H548 Legal blindness, as defined in USA: Secondary | ICD-10-CM | POA: Diagnosis not present

## 2016-01-06 NOTE — Progress Notes (Signed)
Consult Note: Gyn-Onc   Stephanie [name] 70 y.o. female  Chief Complaint  Patient presents with  . endometrial cancer    MD follow up    Assessment : Stage IA grade 2 endometrial carcinoma October 2014 no evidence of disease.  Plan:  She returned to see me in 6 months for followup.   Interval History: The patient returns today as previously scheduled. Since her last visit she's done well. She denies any GI GU or pelvic symptoms. Her functional status is excellent.    HPI: Patient was initially referred with a long history of abnormal uterine bleeding. She presumed is a secondary uterine fibroids. However she was found to have a grade 1 endometrial carcinoma in August 2014 and underwent TAH BSO pelvic lymphadenectomy. Final pathology showed a stage I a grade 2 endometrial carcinoma and no adjuvant therapy was recommended. Surgery was performed on 07/21/2013.  Final pathology showed a grade 2 lesion which was superficially invasive. All lymph nodes were negative. No adjuvant therapy was recommended.  Review of Systems:10 point review of systems is negative except as noted in interval history.   Vitals: Blood pressure 163/95, pulse 116, temperature 97.8 F (36.6 C), temperature source Oral, resp. rate 19, weight 154 lb 1.6 oz (69.899 kg), SpO2 100 %.  Physical Exam: General : The patient is a healthy woman in no acute distress.  HEENT: normocephalic, extraoccular movements normal; neck is supple without thyromegally  Lynphnodes: Supraclavicular and inguinal nodes not enlarged  Abdomen: Soft, non-tender, no ascites, no organomegally, no masses, no hernias , midline incision is healing well. Pelvic:  EGBUS: Normal female  Vagina: Normal, no lesions , vaginal cuff is well healed. Urethra and Bladder: Normal, non-tender  Cervix: Surgically absent  Uterus: Surgically absent  Bi-manual examination: Non-tender; no adenxal masses or nodularity  Rectal: normal sphincter tone, no masses, no  blood  Lower extremities: No edema or varicosities. Normal range of motion      Allergies  Allergen Reactions  . Iron Anaphylaxis    IV Iron  . Iron Dextran Anaphylaxis  . Penicillins Hives and Swelling    PT, STATED ALLERGIES TO ALL " CILLINS" Tongue swelling  . Amoxicillin Other (See Comments)    Unknown reaction  . Ampicillin Other (See Comments)    Unknown reaction  . Codeine Nausea And Vomiting  . Diamox [Acetazolamide] Nausea And Vomiting, Diarrhea and Nausea Only    Other reaction(s): Cramps (ALLERGY/intolerance) Severe nausea and vomitting  . Penicillin G Swelling and Hives  . Other Nausea Only    Past Medical History  Diagnosis Date  . Glaucoma   . Anxiety   . PNA (pneumonia)   . Cataract   . Complication of anesthesia   . PONV (postoperative nausea and vomiting)   . Hypertension   . Anemia   . Vaginal bleeding   . History of transfusion   . Fibroids   . Endometrial cancer ([place])   . Difficulty sleeping   . Blindness, legal     Past Surgical History  Procedure Laterality Date  . Dilation and curettage of uterus    . Laparotomy N/A 07/21/2013    Procedure: EXPLORATORY LAPAROTOMY/ PELVIC LYMPHADENECTOMY   ;  Surgeon: Alvino Chapel, MD;  Location: [facility];  Service: Gynecology;  Laterality: N/A;  . Abdominal hysterectomy N/A 07/21/2013    Procedure: TOTAL HYSTERECTOMY ABDOMINAL ;  Surgeon: Alvino Chapel, MD;  Location: [facility];  Service: Gynecology;  Laterality: N/A;  . Salpingoophorectomy Bilateral 07/21/2013  Procedure: BILATERAL SALPINGO OOPHORECTOMY;  Surgeon: Alvino Chapel, MD;  Location: [facility];  Service: Gynecology;  Laterality: Bilateral;    Current Outpatient Prescriptions  Medication Sig Dispense Refill  . amLODipine (NORVASC) 5 MG tablet Take 1 tablet (5 mg total) by mouth daily. 90 tablet 3  . brimonidine-timolol (COMBIGAN) 0.2-0.5 % ophthalmic solution Place 1 drop into both eyes every 12 (twelve) hours.    .  cyclopentolate (CYCLODRYL,CYCLOGYL) 1 % ophthalmic solution Place 1 drop into the right eye 2 (two) times daily.     . Difluprednate (DUREZOL) 0.05 % EMUL     . latanoprost (XALATAN) 0.005 % ophthalmic solution     . lisinopril-hydrochlorothiazide (PRINZIDE,ZESTORETIC) 20-12.5 MG per tablet Take 2 tablets by mouth daily. 180 tablet 3  . metoprolol tartrate (LOPRESSOR) 25 MG tablet Take 1 tablet (25 mg total) by mouth 2 (two) times daily. 180 tablet 3   No current facility-administered medications for this visit.    Social History   Social History  . Marital Status: Divorced    Spouse Name: N/A  . Number of Children: N/A  . Years of Education: N/A   Occupational History  . Not on file.   Social History Main Topics  . Smoking status: Never Smoker   . Smokeless tobacco: Never Used  . Alcohol Use: No  . Drug Use: No  . Sexual Activity: Not Currently    Birth Control/ Protection: None   Other Topics Concern  . Not on file   Social History Narrative    Family History  Problem Relation Age of Onset  . Hypertension Father   . Vision loss Maternal Grandmother       Alvino Chapel, MD 01/06/2016, 2:16 PM

## 2016-01-06 NOTE — Patient Instructions (Signed)
Plan to follow up in six months or sooner if needed.  Please call [phone] in June or July to schedule your appointment for September 2017.

## 2016-01-27 ENCOUNTER — Other Ambulatory Visit: Payer: Self-pay | Admitting: Internal Medicine

## 2016-02-24 ENCOUNTER — Telehealth: Payer: Self-pay | Admitting: Internal Medicine

## 2016-02-24 DIAGNOSIS — I10 Essential (primary) hypertension: Secondary | ICD-10-CM

## 2016-02-24 MED ORDER — METOPROLOL TARTRATE 25 MG PO TABS: 25.0000 mg | ORAL_TABLET | Freq: Two times a day (BID) | ORAL | Status: DC

## 2016-02-24 MED ORDER — LISINOPRIL-HYDROCHLOROTHIAZIDE 20-12.5 MG PO TABS: 2.0000 | ORAL_TABLET | Freq: Every day | ORAL | Status: DC

## 2016-02-24 MED ORDER — AMLODIPINE BESYLATE 5 MG PO TABS: 5.0000 mg | ORAL_TABLET | Freq: Every day | ORAL | Status: DC

## 2016-02-24 NOTE — Telephone Encounter (Signed)
Pt. Called requesting a refill on the following medications:  amLODipine (NORVASC) 5 MG tablet lisinopril-hydrochlorothiazide (PRINZIDE,ZESTORETIC) 20-12.5 MG per tablet metoprolol tartrate (LOPRESSOR) 25 MG tablet   Pt. Called to make an appointment and was told that she would have to call at the end of May. She stated she needed her medication. Please f/u with pt.

## 2016-02-24 NOTE — Telephone Encounter (Signed)
Requested medications refilled x 1 month.

## 2016-03-14 ENCOUNTER — Telehealth: Payer: Self-pay | Admitting: Internal Medicine

## 2016-03-14 NOTE — Telephone Encounter (Signed)
Patient is requesting medication refill for Xanax....  I informed patient medication is not part of current med list and it was discontinued....  Patient was not aware medication had been discontinued.  Please follow up with patient

## 2016-03-28 NOTE — Telephone Encounter (Signed)
This will be addressed during patient's office visit tomorrow 03/29/2016

## 2016-03-29 ENCOUNTER — Encounter: Payer: Self-pay | Admitting: Internal Medicine

## 2016-03-29 ENCOUNTER — Ambulatory Visit: Payer: Medicare Other | Attending: Internal Medicine | Admitting: Internal Medicine

## 2016-03-29 VITALS — BP 168/105 | HR 101 | Temp 98.1°F | Resp 18 | Ht 67.0 in | Wt 148.6 lb

## 2016-03-29 DIAGNOSIS — F411 Generalized anxiety disorder: Secondary | ICD-10-CM | POA: Insufficient documentation

## 2016-03-29 DIAGNOSIS — I10 Essential (primary) hypertension: Secondary | ICD-10-CM | POA: Diagnosis not present

## 2016-03-29 MED ORDER — ALPRAZOLAM 0.25 MG PO TABS: 0.2500 mg | ORAL_TABLET | Freq: Every evening | ORAL | Status: DC | PRN

## 2016-03-29 MED ORDER — METOPROLOL TARTRATE 25 MG PO TABS: 25.0000 mg | ORAL_TABLET | Freq: Two times a day (BID) | ORAL | Status: DC

## 2016-03-29 MED ORDER — AMLODIPINE BESYLATE 5 MG PO TABS: 5.0000 mg | ORAL_TABLET | Freq: Every day | ORAL | Status: DC

## 2016-03-29 NOTE — Patient Instructions (Signed)
Generalized Anxiety Disorder Generalized anxiety disorder (GAD) is a mental disorder. It interferes with life functions, including relationships, work, and school. GAD is different from normal anxiety, which everyone experiences at some point in their lives in response to specific life events and activities. Normal anxiety actually helps [place] prepare for and get through these life events and activities. Normal anxiety goes away after the event or activity is over.  GAD causes anxiety that is not necessarily related to specific events or activities. It also causes excess anxiety in proportion to specific events or activities. The anxiety associated with GAD is also difficult to control. GAD can vary from mild to severe. People with severe GAD can have intense waves of anxiety with physical symptoms (panic attacks).  SYMPTOMS The anxiety and worry associated with GAD are difficult to control. This anxiety and worry are related to many life events and activities and also occur more days than not for 6 months or longer. People with GAD also have three or more of the following symptoms (one or more in children):  Restlessness.   Fatigue.  Difficulty concentrating.   Irritability.  Muscle tension.  Difficulty sleeping or unsatisfying sleep. DIAGNOSIS GAD is diagnosed through an assessment by your health care provider. Your health care provider will ask you questions aboutyour mood,physical symptoms, and events in your life. Your health care provider may ask you about your medical history and use of alcohol or drugs, including prescription medicines. Your health care provider may also do a physical exam and blood tests. Certain medical conditions and the use of certain substances can cause symptoms similar to those associated with GAD. Your health care provider may refer you to a mental health specialist for further evaluation. TREATMENT The following therapies are usually used to treat GAD:    Medication. Antidepressant medication usually is prescribed for long-term daily control. Antianxiety medicines may be added in severe cases, especially when panic attacks occur.   Talk therapy (psychotherapy). Certain types of talk therapy can be helpful in treating GAD by providing support, education, and guidance. A form of talk therapy called cognitive behavioral therapy can teach you healthy ways to think about and react to daily life events and activities.  Stress managementtechniques. These include yoga, meditation, and exercise and can be very helpful when they are practiced regularly. A mental health specialist can help determine which treatment is best for you. Some people see improvement with one therapy. However, other people require a combination of therapies.   This information is not intended to replace advice given to you by your health care provider. Make sure you discuss any questions you have with your health care provider.   Document Released: 01/26/2013 Document Revised: 10/22/2014 Document Reviewed: 01/26/2013 Elsevier Interactive Patient Education 2016 [employer]. Hypertension Hypertension, commonly called high blood pressure, is when the force of blood pumping through your arteries is too strong. Your arteries are the blood vessels that carry blood from your heart throughout your body. A blood pressure reading consists of a higher number over a lower number, such as 110/72. The higher number (systolic) is the pressure inside your arteries when your heart pumps. The lower number (diastolic) is the pressure inside your arteries when your heart relaxes. Ideally you want your blood pressure below 120/80. Hypertension forces your heart to work harder to pump blood. Your arteries may become narrow or stiff. Having untreated or uncontrolled hypertension can cause heart attack, stroke, kidney disease, and other problems. RISK FACTORS Some risk factors for  high blood pressure  are controllable. Others are not.  Risk factors you cannot control include:   Race. You may be at higher risk if you are African American.  Age. Risk increases with age.  Gender. Men are at higher risk than women before age 45 years. After age 65, women are at higher risk than men. Risk factors you can control include:  Not getting enough exercise or physical activity.  Being overweight.  Getting too much fat, sugar, calories, or salt in your diet.  Drinking too much alcohol. SIGNS AND SYMPTOMS Hypertension does not usually cause signs or symptoms. Extremely high blood pressure (hypertensive crisis) may cause headache, anxiety, shortness of breath, and nosebleed. DIAGNOSIS To check if you have hypertension, your health care provider will measure your blood pressure while you are seated, with your arm held at the level of your heart. It should be measured at least twice using the same arm. Certain conditions can cause a difference in blood pressure between your right and left arms. A blood pressure reading that is higher than normal on one occasion does not mean that you need treatment. If it is not clear whether you have high blood pressure, you may be asked to return on a different day to have your blood pressure checked again. Or, you may be asked to monitor your blood pressure at home for 1 or more weeks. TREATMENT Treating high blood pressure includes making lifestyle changes and possibly taking medicine. Living a healthy lifestyle can help lower high blood pressure. You may need to change some of your habits. Lifestyle changes may include:  Following the DASH diet. This diet is high in fruits, vegetables, and whole grains. It is low in salt, red meat, and added sugars.  Keep your sodium intake below 2,300 mg per day.  Getting at least 30-45 minutes of aerobic exercise at least 4 times per week.  Losing weight if necessary.  Not smoking.  Limiting alcoholic  beverages.  Learning ways to reduce stress. Your health care provider may prescribe medicine if lifestyle changes are not enough to get your blood pressure under control, and if one of the following is true:  You are 18-59 years of age and your systolic blood pressure is above 140.  You are 60 years of age or older, and your systolic blood pressure is above 150.  Your diastolic blood pressure is above 90.  You have diabetes, and your systolic blood pressure is over 140 or your diastolic blood pressure is over 90.  You have kidney disease and your blood pressure is above 140/90.  You have heart disease and your blood pressure is above 140/90. Your personal target blood pressure may vary depending on your medical conditions, your age, and other factors. HOME CARE INSTRUCTIONS  Have your blood pressure rechecked as directed by your health care provider.   Take medicines only as directed by your health care provider. Follow the directions carefully. Blood pressure medicines must be taken as prescribed. The medicine does not work as well when you skip doses. Skipping doses also puts you at risk for problems.  Do not smoke.   Monitor your blood pressure at home as directed by your health care provider. SEEK MEDICAL CARE IF:   You think you are having a reaction to medicines taken.  You have recurrent headaches or feel dizzy.  You have swelling in your ankles.  You have trouble with your vision. SEEK IMMEDIATE MEDICAL CARE IF:  You develop a severe   headache or confusion.  You have unusual weakness, numbness, or feel faint.  You have severe chest or abdominal pain.  You vomit repeatedly.  You have trouble breathing. MAKE SURE YOU:   Understand these instructions.  Will watch your condition.  Will get help right away if you are not doing well or get worse.   This information is not intended to replace advice given to you by your health care provider. Make sure you  discuss any questions you have with your health care provider.   Document Released: 10/01/2005 Document Revised: 02/15/2015 Document Reviewed: 07/24/2013 Elsevier Interactive Patient Education [employer].

## 2016-03-29 NOTE — Progress Notes (Signed)
Patient ID: Stephanie [name], female   DOB: 27-Jan-1946, 70 y.o.   MRN: [id number]   Stephanie [name], is a 70 y.o. female  [id number]  [id number]  DOB - Oct 18, 1945  Chief Complaint  Patient presents with  . Hypertension        Subjective:   Stephanie [name] is a 70 y.o. female with multiple medical conditions including hypertension, tachycardia, generalized anxiety, glaucoma status post surgery, legally blind here today for a follow up visit and medication refill. Patient's blood pressure is elevated today but this is usual for her, she has to white coat syndrome and according to her blood pressure has always been controlled at home, in fact her blood pressure yesterday was 123456 systolic but anytime she goes to any physicians office, her blood pressure is usually in the high 123456 systolic. She brought in her blood pressure log today which corroborates her story of normal ambulatory blood pressure. She has no complaint today. She needs refill on some of her medications. She denies being depressed, home environment is good. No Recent fall. No fever, no urinary symptoms. Patient has No headache, No chest pain, No abdominal pain - No Nausea, No new weakness tingling or numbness, No Cough - SOB.  No problems updated.  ALLERGIES: Allergies  Allergen Reactions  . Iron Anaphylaxis    IV Iron  . Iron Dextran Anaphylaxis  . Penicillins Hives and Swelling    PT, STATED ALLERGIES TO ALL " CILLINS" Tongue swelling  . Amoxicillin Other (See Comments)    Unknown reaction  . Ampicillin Other (See Comments)    Unknown reaction  . Codeine Nausea And Vomiting  . Diamox [Acetazolamide] Nausea And Vomiting, Diarrhea and Nausea Only    Other reaction(s): Cramps (ALLERGY/intolerance) Severe nausea and vomitting  . Penicillin G Swelling and Hives  . Other Nausea Only    PAST MEDICAL HISTORY: Past Medical History  Diagnosis Date  . Glaucoma   . Anxiety   . PNA (pneumonia)   . Cataract   .  Complication of anesthesia   . PONV (postoperative nausea and vomiting)   . Hypertension   . Anemia   . Vaginal bleeding   . History of transfusion   . Fibroids   . Endometrial cancer ([place])   . Difficulty sleeping   . Blindness, legal     MEDICATIONS AT HOME: Prior to Admission medications   Medication Sig Start Date End Date Taking? Authorizing Provider  amLODipine (NORVASC) 5 MG tablet Take 1 tablet (5 mg total) by mouth daily. 03/29/16  Yes Melika Reder E Doreene Burke, MD  brimonidine-timolol (COMBIGAN) 0.2-0.5 % ophthalmic solution Place 1 drop into both eyes every 12 (twelve) hours.   Yes Historical Provider, MD  cyclopentolate (CYCLODRYL,CYCLOGYL) 1 % ophthalmic solution Place 1 drop into the right eye 2 (two) times daily.  05/04/14  Yes Historical Provider, MD  Difluprednate (DUREZOL) 0.05 % EMUL    Yes Historical Provider, MD  latanoprost (XALATAN) 0.005 % ophthalmic solution    Yes Historical Provider, MD  lisinopril-hydrochlorothiazide (PRINZIDE,ZESTORETIC) 20-12.5 MG tablet Take 2 tablets by mouth daily. 02/24/16  Yes Tresa Garter, MD  metoprolol tartrate (LOPRESSOR) 25 MG tablet Take 1 tablet (25 mg total) by mouth 2 (two) times daily. 03/29/16  Yes Tresa Garter, MD  ALPRAZolam (XANAX) 0.25 MG tablet Take 1 tablet (0.25 mg total) by mouth at bedtime as needed for anxiety. 03/29/16   Tresa Garter, MD     Objective:   Filed Vitals:   03/29/16  1636  BP: 168/105  Pulse: 101  Temp: 98.1 F (36.7 C)  TempSrc: Oral  Resp: 18  Height: 5\' 7"  (1.702 m)  Weight: 148 lb 9.6 oz (67.405 kg)  SpO2: 95%    Exam General appearance : Awake, alert, not in any distress. Speech Clear. Not toxic looking HEENT: Atraumatic and Normocephalic, pupils equally reactive to light and accomodation Neck: Supple, no JVD. No cervical lymphadenopathy.  Chest: Good air entry bilaterally, no added sounds  CVS: S1 S2 regular, no murmurs.  Abdomen: Bowel sounds present, Non tender and not  distended with no gaurding, rigidity or rebound. Extremities: B/L Lower Ext shows no edema, both legs are warm to touch Neurology: Awake alert, and oriented X 3, CN II-XII intact, Non focal Skin: No Rash  Data Review No results found for: HGBA1C   Assessment & Plan   1. Uncontrolled hypertension: Most likely "white coat syndrome". Patient brought ambulatory BP log, average of 120/70 mmHg Refill - metoprolol tartrate (LOPRESSOR) 25 MG tablet; Take 1 tablet (25 mg total) by mouth 2 (two) times daily.  Dispense: 180 tablet; Refill: 3 - amLODipine (NORVASC) 5 MG tablet; Take 1 tablet (5 mg total) by mouth daily.  Dispense: 90 tablet; Refill: 3  We have discussed target BP range and blood pressure goal. I have advised patient to check BP regularly and to call us back or report to clinic if the numbers are consistently higher than 140/90. We discussed the importance of compliance with medical therapy and DASH diet recommended, consequences of uncontrolled hypertension discussed.  - continue current BP medications  2. Generalized anxiety disorder Refill - ALPRAZolam (XANAX) 0.25 MG tablet; Take 1 tablet (0.25 mg total) by mouth at bedtime as needed for anxiety.  Dispense: 60 tablet; Refill: 0  Patient have been counseled extensively about nutrition and exercise  Return in about 6 months (around 09/28/2016) for Follow up HTN, Generalized Anxiety Disorder.  The patient was given clear instructions to go to ER or return to medical center if symptoms don't improve, worsen or new problems develop. The patient verbalized understanding. The patient was told to call to get lab results if they haven't heard anything in the next week.   This note has been created with Surveyor, quantity. Any transcriptional errors are unintentional.    Angelica Chessman, MD, [place], Karilyn Cota, [place] and [place], [place]    03/29/2016, 5:12 PM

## 2016-03-29 NOTE — Progress Notes (Signed)
Patient is here for FU BP  Patient states she has "White Coat" Syndrome. Patient presented with a weeks worth of pressure logs.  Patient denies pain at this time.  Patient has taken medication and patient has eaten today.

## 2016-04-11 NOTE — Telephone Encounter (Signed)
Pt. Called to let her PCP know that she visited her optometrist and everything went well. Pt. Has a stye in her eye and was giving medication for it. It should go away in 4 to 6 weeks.  Pt. Just wanted to let her PCP know.

## 2016-06-12 ENCOUNTER — Telehealth: Payer: Self-pay | Admitting: Internal Medicine

## 2016-06-12 NOTE — Telephone Encounter (Signed)
Pt. Called requesting a refill on ALPRAZolam (XANAX) 0.25 MG tablet. Please f/u

## 2016-06-14 NOTE — Telephone Encounter (Signed)
Pt calling to see if PCP reviewed the Xanax Rx

## 2016-06-18 ENCOUNTER — Encounter (HOSPITAL_COMMUNITY): Payer: Self-pay | Admitting: Emergency Medicine

## 2016-06-18 ENCOUNTER — Emergency Department (HOSPITAL_COMMUNITY)
Admission: EM | Admit: 2016-06-18 | Discharge: 2016-06-18 | Disposition: A | Discharge status: Home / Self Care | Payer: Medicare Other | Attending: Emergency Medicine | Admitting: Emergency Medicine

## 2016-06-18 DIAGNOSIS — I10 Essential (primary) hypertension: Secondary | ICD-10-CM | POA: Insufficient documentation

## 2016-06-18 DIAGNOSIS — E876 Hypokalemia: Secondary | ICD-10-CM | POA: Diagnosis not present

## 2016-06-18 DIAGNOSIS — Z8542 Personal history of malignant neoplasm of other parts of uterus: Secondary | ICD-10-CM | POA: Diagnosis not present

## 2016-06-18 DIAGNOSIS — Z79899 Other long term (current) drug therapy: Secondary | ICD-10-CM | POA: Insufficient documentation

## 2016-06-18 DIAGNOSIS — R55 Syncope and collapse: Secondary | ICD-10-CM | POA: Diagnosis present

## 2016-06-18 LAB — CBC WITH DIFFERENTIAL/PLATELET
Basophils Absolute: 0 10*3/uL (ref 0.0–0.1)
Basophils Relative: 0 %
EOS ABS: 0.1 10*3/uL (ref 0.0–0.7)
Eosinophils Relative: 1 %
HEMATOCRIT: 39 % (ref 36.0–46.0)
HEMOGLOBIN: 14.1 g/dL (ref 12.0–15.0)
LYMPHS ABS: 2 10*3/uL (ref 0.7–4.0)
Lymphocytes Relative: 26 %
MCH: 31.5 pg (ref 26.0–34.0)
MCHC: 36.2 g/dL — AB (ref 30.0–36.0)
MCV: 87.1 fL (ref 78.0–100.0)
MONO ABS: 0.5 10*3/uL (ref 0.1–1.0)
MONOS PCT: 6 %
NEUTROS PCT: 67 %
Neutro Abs: 5.3 10*3/uL (ref 1.7–7.7)
Platelets: 136 10*3/uL — ABNORMAL LOW (ref 150–400)
RBC: 4.48 MIL/uL (ref 3.87–5.11)
RDW: 12.8 % (ref 11.5–15.5)
WBC: 7.9 10*3/uL (ref 4.0–10.5)

## 2016-06-18 LAB — BASIC METABOLIC PANEL
Anion gap: 11 (ref 5–15)
BUN: 19 mg/dL (ref 6–20)
CALCIUM: 9.2 mg/dL (ref 8.9–10.3)
CHLORIDE: 101 mmol/L (ref 101–111)
CO2: 27 mmol/L (ref 22–32)
CREATININE: 0.82 mg/dL (ref 0.44–1.00)
GFR calc Af Amer: >60 mL/min (ref >60)
GFR calc non Af Amer: >60 mL/min (ref >60)
Glucose, Bld: 108 mg/dL — ABNORMAL HIGH (ref 65–99)
Potassium: 3 mmol/L — ABNORMAL LOW (ref 3.5–5.1)
SODIUM: 139 mmol/L (ref 135–145)

## 2016-06-18 LAB — CBG MONITORING, ED: GLUCOSE-CAPILLARY: 123 mg/dL — AB (ref 65–99)

## 2016-06-18 MED ORDER — POTASSIUM CHLORIDE ER 20 MEQ PO TBCR: 40.0000 meq | EXTENDED_RELEASE_TABLET | Freq: Two times a day (BID) | ORAL | 0 refills | Status: DC

## 2016-06-18 MED ORDER — POTASSIUM CHLORIDE CRYS ER 20 MEQ PO TBCR
40.0000 meq | EXTENDED_RELEASE_TABLET | Freq: Once | ORAL | Status: AC
  Administered 2016-06-18: 40 meq via ORAL
  Filled 2016-06-18: qty 2

## 2016-06-18 NOTE — ED Triage Notes (Signed)
Pt was home with her son and was leaving the bathroom. Pt states she felt dizzy and she called her son. Pt experienced an episode of syncope that lasted about 2 min. Pt was caught by her son and did not fall. Pt has no reports of pain or discomfort. Pt is alert and oriented x 4. Pt states she has not been drinking much water today

## 2016-06-18 NOTE — ED Provider Notes (Signed)
[facility] DEPT Provider Note   CSN: [id number] Arrival date & time: 06/18/16  1605     History   Chief Complaint Chief Complaint  Patient presents with  . Loss of Consciousness    HPI Stephanie [name] is a 70 y.o. female.  The history is provided by the patient.  Loss of Consciousness   This is a new problem. The current episode started 1 to 2 hours ago. The problem occurs rarely. The problem has been rapidly improving. She lost consciousness for a period of 1 to 5 minutes. The problem is associated with bowel movements. Associated symptoms include light-headedness. Pertinent negatives include bladder incontinence, bowel incontinence, dizziness, fever, headaches, malaise/fatigue, vertigo and weakness. She has tried nothing for the symptoms.    Past Medical History:  Diagnosis Date  . Anemia   . Anxiety   . Blindness, legal   . Cataract   . Complication of anesthesia   . Difficulty sleeping   . Endometrial cancer ([place])   . Fibroids   . Glaucoma   . History of transfusion   . Hypertension   . PNA (pneumonia)   . PONV (postoperative nausea and vomiting)   . Vaginal bleeding     Patient Active Problem List   Diagnosis Date Noted  . Uncontrolled hypertension 02/01/2015  . Tachycardia 02/01/2015  . Endometrial cancer ([place]) 06/17/2013  . Fibroid uterus 06/01/2013  . Thickened endometrium 06/01/2013  . Postmenopausal vaginal bleeding 06/01/2013  . Follow up 05/22/2013  . Severe uncontrolled hypertension 05/15/2013  . Generalized anxiety disorder 05/15/2013  . SOB (shortness of breath) 04/11/2013  . Iron deficiency 04/11/2013  . Glaucoma 04/11/2013  . Iron deficiency anemia due to chronic blood loss 04/04/2012  . Senile nuclear sclerosis 02/18/2012  . Primary open-angle glaucoma 02/18/2012  . Severe stage glaucoma 02/18/2012    Past Surgical History:  Procedure Laterality Date  . ABDOMINAL HYSTERECTOMY N/A 07/21/2013   Procedure: TOTAL HYSTERECTOMY ABDOMINAL  ;  Surgeon: Alvino Chapel, MD;  Location: [facility];  Service: Gynecology;  Laterality: N/A;  . DILATION AND CURETTAGE OF UTERUS    . LAPAROTOMY N/A 07/21/2013   Procedure: EXPLORATORY LAPAROTOMY/ PELVIC LYMPHADENECTOMY   ;  Surgeon: Alvino Chapel, MD;  Location: [facility];  Service: Gynecology;  Laterality: N/A;  . SALPINGOOPHORECTOMY Bilateral 07/21/2013   Procedure: BILATERAL SALPINGO OOPHORECTOMY;  Surgeon: Alvino Chapel, MD;  Location: [facility];  Service: Gynecology;  Laterality: Bilateral;    OB History    Gravida Para Term Preterm AB Living   2 1 1  0 1 1   SAB TAB Ectopic Multiple Live Births   1               Home Medications    Prior to Admission medications   Medication Sig Start Date End Date Taking? Authorizing Provider  ALPRAZolam (XANAX) 0.25 MG tablet Take 1 tablet (0.25 mg total) by mouth at bedtime as needed for anxiety. 03/29/16  Yes Tresa Garter, MD  amLODipine (NORVASC) 5 MG tablet Take 1 tablet (5 mg total) by mouth daily. 03/29/16  Yes Olugbemiga E Doreene Burke, MD  brimonidine-timolol (COMBIGAN) 0.2-0.5 % ophthalmic solution Place 1 drop into both eyes every 12 (twelve) hours.   Yes Historical Provider, MD  cyclopentolate (CYCLODRYL,CYCLOGYL) 1 % ophthalmic solution Place 1 drop into the right eye 2 (two) times daily.  05/04/14  Yes Historical Provider, MD  dorzolamide (TRUSOPT) 2 % ophthalmic solution Place 1 drop into the left eye 2 (two) times  daily.   Yes Historical Provider, MD  latanoprost (XALATAN) 0.005 % ophthalmic solution Place 1 drop into the left eye at bedtime.    Yes Historical Provider, MD  lisinopril-hydrochlorothiazide (PRINZIDE,ZESTORETIC) 20-12.5 MG tablet Take 2 tablets by mouth daily. Patient taking differently: Take 2 tablets by mouth 2 (two) times daily.  02/24/16  Yes Tresa Garter, MD  metoprolol tartrate (LOPRESSOR) 25 MG tablet Take 1 tablet (25 mg total) by mouth 2 (two) times daily. 03/29/16  Yes Tresa Garter, MD  prednisoLONE acetate (PRED FORTE) 1 % ophthalmic suspension Place 1 drop into the right eye 4 (four) times daily.  06/14/16  Yes Historical Provider, MD    Family History Family History  Problem Relation Age of Onset  . Hypertension Father   . Vision loss Maternal Grandmother     Social History Social History  Substance Use Topics  . Smoking status: Never Smoker  . Smokeless tobacco: Never Used  . Alcohol use No     Allergies   Iron; Iron dextran; Penicillins; Amoxicillin; Ampicillin; Codeine; Diamox [acetazolamide]; Penicillin g; and Other   Review of Systems Review of Systems  Constitutional: Negative for fever and malaise/fatigue.  Cardiovascular: Positive for syncope.  Gastrointestinal: Negative for bowel incontinence.  Genitourinary: Negative for bladder incontinence.  Neurological: Positive for light-headedness. Negative for dizziness, vertigo, weakness and headaches.  All other systems reviewed and are negative.    Physical Exam Updated Vital Signs BP 130/79   Pulse 90   Temp 98.1 F (36.7 C) (Oral)   Resp 14   SpO2 100%   Physical Exam  Constitutional: She is oriented to person, place, and time. She appears well-developed and well-nourished. No distress.  HENT:  Head: Normocephalic.  Eyes: Conjunctivae are normal.  Neck: Neck supple. No tracheal deviation present.  Cardiovascular: Normal rate, regular rhythm and normal heart sounds.   Pulmonary/Chest: Effort normal. No respiratory distress. She has no wheezes. She has no rales.  Abdominal: Soft. She exhibits no distension. There is no tenderness.  Neurological: She is alert and oriented to person, place, and time.  Normal finger to nose testing and rapid alternating movement   Skin: Skin is warm and dry.  Psychiatric: She has a normal mood and affect.  Vitals reviewed.    ED Treatments / Results  Labs (all labs ordered are listed, but only abnormal results are displayed) Labs Reviewed   CBC WITH DIFFERENTIAL/PLATELET - Abnormal; Notable for the following:       Result Value   MCHC 36.2 (*)    Platelets 136 (*)    All other components within normal limits  BASIC METABOLIC PANEL - Abnormal; Notable for the following:    Potassium 3.0 (*)    Glucose, Bld 108 (*)    All other components within normal limits  CBG MONITORING, ED - Abnormal; Notable for the following:    Glucose-Capillary 123 (*)    All other components within normal limits  I-STAT CHEM 8, ED    EKG  EKG Interpretation  Date/Time:  Monday June 18 2016 16:24:02 EDT Ventricular Rate:  94 PR Interval:    QRS Duration: 92 QT Interval:  366 QTC Calculation: 458 R Axis:   53 Text Interpretation:  Sinus rhythm Consider left atrial enlargement Probable LVH with secondary repol abnrm Since last tracing rate slower Nonspecific T wave abnormality, improved in Lateral leads Otherwise no significant change Confirmed by Lonnel Gjerde MD, Quillian Quince [id number]) on 06/18/2016 4:34:41 PM Also confirmed by Laneta Simmers MD, Ryhanna Dunsmore (  [id number]), editor Yehuda Mao [id number])  on 06/19/2016 7:33:15 AM       Radiology No results found.  Procedures Procedures (including critical care time)  Medications Ordered in ED Medications  potassium chloride SA (K-DUR,KLOR-CON) CR tablet 40 mEq (40 mEq Oral Given 06/18/16 1914)     Initial Impression / Assessment and Plan / ED Course  I have reviewed the triage vital signs and the nursing notes.  Pertinent labs & imaging results that were available during my care of the patient were reviewed by me and considered in my medical decision making (see chart for details).  Clinical Course    70 year old female presents after a syncopal episode that occurred after she had a bowel movement, felt lightheaded, and slumped to the ground losing consciousness for 1-2 minutes as witnessed by her son. She recovered spontaneously. EKG interpreted by me without ST or T wave changes concerning for myocardial  ischemia. No delta wave, no prolonged QTc, no brugada to suggest arrhythmogenicity. She was slightly low and was repleted here. No significant hematologic or metabolic abnormalities to explain symptoms. Appears to be vagal in nature and patient has had no recurrence with normal ambulation here. Plan to follow up with PCP as needed and return precautions discussed for worsening or new concerning symptoms.   Final Clinical Impressions(s) / ED Diagnoses   Final diagnoses:  Vasovagal syncope  Hypokalemia    New Prescriptions Discharge Medication List as of 06/18/2016  7:13 PM    START taking these medications   Details  potassium chloride 20 MEQ TBCR Take 40 mEq by mouth 2 (two) times daily., Starting Mon 06/18/2016, Until Sat 06/23/2016, Print         Leo Grosser, MD 06/19/16 2351

## 2016-06-19 ENCOUNTER — Other Ambulatory Visit: Payer: Self-pay | Admitting: Internal Medicine

## 2016-06-19 DIAGNOSIS — F411 Generalized anxiety disorder: Secondary | ICD-10-CM

## 2016-06-19 MED ORDER — ALPRAZOLAM 0.25 MG PO TABS: 0.2500 mg | ORAL_TABLET | Freq: Every evening | ORAL | 0 refills | Status: DC | PRN

## 2016-06-19 NOTE — Telephone Encounter (Signed)
Patients medication has been refilled. Will be placed at the front desk for pickup.

## 2016-06-21 ENCOUNTER — Telehealth: Payer: Self-pay | Admitting: Internal Medicine

## 2016-06-21 NOTE — Telephone Encounter (Signed)
Patient advised w/ Bp being elevated and symptomatic it would be appropriate to go to ED.  Patient now states BP has decreased and thinks she had an anxiety attack. RN advised patient with symptoms/recent ED visit it's appropriate to follow up w/ PCP.  States she has appt for flu vaccine and will make appointment then.    No further action needed at this time.

## 2016-06-21 NOTE — Telephone Encounter (Signed)
Pt calling stating she went the the ED two days ago with BP at 112/72 Pt states her BP right now is 163/99 Pt wants advice on what she should do Pt states she feels like she is about to have a heart attack and was advised to go to the ED   Pt requesting a call back from a nurse

## 2016-07-12 ENCOUNTER — Telehealth: Payer: Self-pay | Admitting: *Deleted

## 2016-07-12 NOTE — Telephone Encounter (Signed)
"  I usually call this number and reach GYN.  I need to make my Semi-annual Pap with Dr. Fermin Schwab."  Call transferred ext [phone].

## 2016-07-23 ENCOUNTER — Telehealth: Payer: Self-pay | Admitting: Internal Medicine

## 2016-07-23 DIAGNOSIS — I10 Essential (primary) hypertension: Secondary | ICD-10-CM

## 2016-07-23 MED ORDER — LISINOPRIL-HYDROCHLOROTHIAZIDE 20-12.5 MG PO TABS: 2.0000 | ORAL_TABLET | Freq: Every day | ORAL | 0 refills | Status: DC

## 2016-07-23 NOTE — Telephone Encounter (Signed)
Patient is needing a refill for lisinopril Please follow up.

## 2016-07-23 NOTE — Telephone Encounter (Signed)
Lisinopril-hydrochlorothiazide refilled.

## 2016-08-06 ENCOUNTER — Telehealth: Payer: Self-pay | Admitting: Internal Medicine

## 2016-08-06 NOTE — Telephone Encounter (Signed)
Pt. Called requesting to speak to nurse regarding her medication.  Pt. States that she is not due until December to come back and she PCP. Pt. Called requesting refills for medication and was only given  A one month supply. Please f/u

## 2016-08-10 NOTE — Telephone Encounter (Signed)
Thank you. I will make her aware.

## 2016-08-10 NOTE — Telephone Encounter (Signed)
Lisinopril was reordered this month by [facility]. Patient received Xanax last month. Patient received all other refills at last visit in June with 3 additional refills and patient is now requesting refills.

## 2016-08-10 NOTE — Telephone Encounter (Signed)
Advise patient to call the pharmacy for her refills, she has refills on her prescriptions

## 2016-08-17 ENCOUNTER — Encounter: Payer: Self-pay | Admitting: Gynecology

## 2016-08-17 ENCOUNTER — Ambulatory Visit: Payer: Medicare Other | Attending: Gynecology | Admitting: Gynecology

## 2016-08-17 VITALS — BP 164/89 | HR 99 | Temp 98.3°F | Resp 18 | Ht 67.0 in | Wt 157.6 lb

## 2016-08-17 DIAGNOSIS — Z8542 Personal history of malignant neoplasm of other parts of uterus: Secondary | ICD-10-CM

## 2016-08-17 DIAGNOSIS — H547 Unspecified visual loss: Secondary | ICD-10-CM | POA: Diagnosis not present

## 2016-08-17 DIAGNOSIS — H269 Unspecified cataract: Secondary | ICD-10-CM | POA: Insufficient documentation

## 2016-08-17 DIAGNOSIS — Z880 Allergy status to penicillin: Secondary | ICD-10-CM | POA: Diagnosis not present

## 2016-08-17 DIAGNOSIS — H409 Unspecified glaucoma: Secondary | ICD-10-CM | POA: Insufficient documentation

## 2016-08-17 DIAGNOSIS — F419 Anxiety disorder, unspecified: Secondary | ICD-10-CM | POA: Insufficient documentation

## 2016-08-17 DIAGNOSIS — I10 Essential (primary) hypertension: Secondary | ICD-10-CM | POA: Diagnosis not present

## 2016-08-17 DIAGNOSIS — C541 Malignant neoplasm of endometrium: Secondary | ICD-10-CM | POA: Diagnosis not present

## 2016-08-17 DIAGNOSIS — D649 Anemia, unspecified: Secondary | ICD-10-CM | POA: Insufficient documentation

## 2016-08-17 NOTE — Patient Instructions (Signed)
Plan to follow up in 6 months . Call with any changes , questions or concerns.  Thank you

## 2016-08-17 NOTE — Progress Notes (Signed)
Consult Note: Gyn-Onc   Stephanie [name] 70 y.o. female  Chief Complaint  Patient presents with  . endometrial cancer    follow up    Assessment : Stage IA grade 2 endometrial carcinoma October 2014 no evidence of disease.  Plan:  She returned to see me in 6 months for followup. She needs to schedule mammograms and colonoscopy.  Interval History: The patient returns today as previously scheduled. Since her last visit she's done well. She denies any GI GU or pelvic symptoms. Her functional status is excellent.    HPI: Patient was initially referred with a long history of abnormal uterine bleeding. She presumed is a secondary uterine fibroids. However she was found to have a grade 1 endometrial carcinoma in August 2014 and underwent TAH BSO pelvic lymphadenectomy. Final pathology showed a stage I a grade 2 endometrial carcinoma and no adjuvant therapy was recommended. Surgery was performed on 07/21/2013.  Final pathology showed a grade 2 lesion which was superficially invasive. All lymph nodes were negative. No adjuvant therapy was recommended.  Review of Systems:10 point review of systems is negative except as noted in interval history.   Vitals: Blood pressure (!) 164/89, pulse 99, temperature 98.3 F (36.8 C), temperature source Oral, resp. rate 18, height 5\' 7"  (1.702 m), weight 157 lb 9.6 oz (71.5 kg), SpO2 100 %.  Physical Exam: General : The patient is a healthy woman in no acute distress.  HEENT: normocephalic, extraoccular movements normal; neck is supple without thyromegally  Lynphnodes: Supraclavicular and inguinal nodes not enlarged  Abdomen: Soft, non-tender, no ascites, no organomegally, no masses, no hernias , midline incision is healing well. Pelvic:  EGBUS: Normal female  Vagina: Normal, no lesions , vaginal cuff is well healed. Urethra and Bladder: Normal, non-tender  Cervix: Surgically absent  Uterus: Surgically absent  Bi-manual examination: Non-tender; no  adenxal masses or nodularity  Rectal: normal sphincter tone, no masses, no blood  Lower extremities: No edema or varicosities. Normal range of motion      Allergies  Allergen Reactions  . Iron Anaphylaxis    IV Iron  . Iron Dextran Anaphylaxis  . Penicillins Hives and Swelling    PT, STATED ALLERGIES TO ALL " CILLINS" Tongue swelling  . Amoxicillin Other (See Comments)    Unknown reaction  . Ampicillin Other (See Comments)    Unknown reaction  . Codeine Nausea And Vomiting  . Diamox [Acetazolamide] Nausea And Vomiting, Diarrhea and Nausea Only    Other reaction(s): Cramps (ALLERGY/intolerance) Severe nausea and vomitting  . Penicillin G Swelling and Hives  . Other Nausea Only    Past Medical History:  Diagnosis Date  . Anemia   . Anxiety   . Blindness, legal   . Cataract   . Complication of anesthesia   . Difficulty sleeping   . Endometrial cancer ([place])   . Fibroids   . Glaucoma   . History of transfusion   . Hypertension   . PNA (pneumonia)   . PONV (postoperative nausea and vomiting)   . Vaginal bleeding     Past Surgical History:  Procedure Laterality Date  . ABDOMINAL HYSTERECTOMY N/A 07/21/2013   Procedure: TOTAL HYSTERECTOMY ABDOMINAL ;  Surgeon: Alvino Chapel, MD;  Location: [facility];  Service: Gynecology;  Laterality: N/A;  . DILATION AND CURETTAGE OF UTERUS    . LAPAROTOMY N/A 07/21/2013   Procedure: EXPLORATORY LAPAROTOMY/ PELVIC LYMPHADENECTOMY   ;  Surgeon: Alvino Chapel, MD;  Location: [facility];  Service:  Gynecology;  Laterality: N/A;  . SALPINGOOPHORECTOMY Bilateral 07/21/2013   Procedure: BILATERAL SALPINGO OOPHORECTOMY;  Surgeon: Alvino Chapel, MD;  Location: [facility];  Service: Gynecology;  Laterality: Bilateral;    Current Outpatient Prescriptions  Medication Sig Dispense Refill  . ALPRAZolam (XANAX) 0.25 MG tablet Take 1 tablet (0.25 mg total) by mouth at bedtime as needed for anxiety. 60 tablet 0  . amLODipine  (NORVASC) 5 MG tablet Take 1 tablet (5 mg total) by mouth daily. 90 tablet 3  . brimonidine-timolol (COMBIGAN) 0.2-0.5 % ophthalmic solution Place 1 drop into both eyes every 12 (twelve) hours.    . cyclopentolate (CYCLODRYL,CYCLOGYL) 1 % ophthalmic solution Place 1 drop into the right eye 2 (two) times daily.     . dorzolamide (TRUSOPT) 2 % ophthalmic solution Place 1 drop into the left eye 2 (two) times daily.    Marland Kitchen latanoprost (XALATAN) 0.005 % ophthalmic solution Place 1 drop into the left eye at bedtime.     Marland Kitchen lisinopril-hydrochlorothiazide (PRINZIDE,ZESTORETIC) 20-12.5 MG tablet Take 2 tablets by mouth daily. 60 tablet 0  . metoprolol tartrate (LOPRESSOR) 25 MG tablet Take 1 tablet (25 mg total) by mouth 2 (two) times daily. 180 tablet 3  . prednisoLONE acetate (PRED FORTE) 1 % ophthalmic suspension Place 1 drop into the right eye 4 (four) times daily.     . potassium chloride 20 MEQ TBCR Take 40 mEq by mouth 2 (two) times daily. 20 tablet 0   No current facility-administered medications for this visit.     Social History   Social History  . Marital status: Divorced    Spouse name: N/A  . Number of children: N/A  . Years of education: N/A   Occupational History  . Not on file.   Social History Main Topics  . Smoking status: Never Smoker  . Smokeless tobacco: Never Used  . Alcohol use No  . Drug use: No  . Sexual activity: Not Currently    Birth control/ protection: None   Other Topics Concern  . Not on file   Social History Narrative  . No narrative on file    Family History  Problem Relation Age of Onset  . Hypertension Father   . Vision loss Maternal Grandmother       [name], MD 08/17/2016, 2:09 PM

## 2016-08-28 ENCOUNTER — Other Ambulatory Visit: Payer: Self-pay | Admitting: Internal Medicine

## 2016-08-28 DIAGNOSIS — I10 Essential (primary) hypertension: Secondary | ICD-10-CM

## 2016-08-28 NOTE — Telephone Encounter (Signed)
Pt. Called stating that her pharmacy never received the Rx for lisinopril-hydrochlorothiazide (PRINZIDE,ZESTORETIC) 20-12.5 MG tablet  Pt. Does not have any more medication left and would like it to be sent to [facility] on [address].  Please f/u.

## 2016-09-29 ENCOUNTER — Other Ambulatory Visit: Payer: Self-pay | Admitting: Internal Medicine

## 2016-09-29 DIAGNOSIS — I10 Essential (primary) hypertension: Secondary | ICD-10-CM

## 2016-10-04 ENCOUNTER — Ambulatory Visit: Payer: Medicare Other | Admitting: Internal Medicine

## 2016-10-11 ENCOUNTER — Ambulatory Visit: Payer: Medicare Other | Admitting: Internal Medicine

## 2016-10-17 ENCOUNTER — Ambulatory Visit: Payer: Medicare Other | Admitting: Internal Medicine

## 2016-11-02 ENCOUNTER — Other Ambulatory Visit: Payer: Self-pay | Admitting: Internal Medicine

## 2016-11-02 DIAGNOSIS — I10 Essential (primary) hypertension: Secondary | ICD-10-CM

## 2016-12-03 ENCOUNTER — Telehealth: Payer: Self-pay | Admitting: Internal Medicine

## 2016-12-03 DIAGNOSIS — I10 Essential (primary) hypertension: Secondary | ICD-10-CM

## 2016-12-03 MED ORDER — LISINOPRIL-HYDROCHLOROTHIAZIDE 20-12.5 MG PO TABS: 2.0000 | ORAL_TABLET | Freq: Every day | ORAL | 0 refills | Status: DC

## 2016-12-03 NOTE — Telephone Encounter (Signed)
Refilled x 30 days. 

## 2016-12-03 NOTE — Telephone Encounter (Signed)
Pt called requesting medication refill on lisinopril-hydrochlorothiazide (PRINZIDE,ZESTORETIC) 20-12.5 MG tablet , pt is aware she needs ov for refills but is having transportation issues. Pt is scheduled 02/28 with pcp but has only 2 pills. Please f/up

## 2016-12-12 ENCOUNTER — Ambulatory Visit: Payer: Medicare Other | Admitting: Internal Medicine

## 2016-12-19 ENCOUNTER — Ambulatory Visit: Payer: Medicare Other | Admitting: Internal Medicine

## 2016-12-26 ENCOUNTER — Other Ambulatory Visit: Payer: Self-pay | Admitting: Internal Medicine

## 2016-12-26 ENCOUNTER — Telehealth: Payer: Self-pay | Admitting: Internal Medicine

## 2016-12-26 DIAGNOSIS — I10 Essential (primary) hypertension: Secondary | ICD-10-CM

## 2016-12-26 MED ORDER — LISINOPRIL-HYDROCHLOROTHIAZIDE 20-12.5 MG PO TABS: 2.0000 | ORAL_TABLET | Freq: Every day | ORAL | 0 refills | Status: DC

## 2016-12-26 NOTE — Telephone Encounter (Signed)
Refilled x 30 days. 

## 2016-12-26 NOTE — Telephone Encounter (Signed)
Medication Refill: lisinopril-hydrochlorothiazide (PRINZIDE,ZESTORETIC) 20-12.5 MG tablet   Pt has appt with pcp scheduled for 3/21 but states she will be out of medication before OV Pt still uses [facility] on [address]

## 2017-01-02 ENCOUNTER — Ambulatory Visit: Payer: Medicare Other | Attending: Internal Medicine | Admitting: Internal Medicine

## 2017-01-02 ENCOUNTER — Encounter: Payer: Self-pay | Admitting: Internal Medicine

## 2017-01-02 VITALS — BP 165/84 | HR 100 | Temp 98.2°F | Resp 18 | Ht 66.0 in | Wt 166.0 lb

## 2017-01-02 DIAGNOSIS — E876 Hypokalemia: Secondary | ICD-10-CM | POA: Diagnosis not present

## 2017-01-02 DIAGNOSIS — Z8542 Personal history of malignant neoplasm of other parts of uterus: Secondary | ICD-10-CM | POA: Insufficient documentation

## 2017-01-02 DIAGNOSIS — H409 Unspecified glaucoma: Secondary | ICD-10-CM | POA: Diagnosis not present

## 2017-01-02 DIAGNOSIS — H548 Legal blindness, as defined in USA: Secondary | ICD-10-CM | POA: Insufficient documentation

## 2017-01-02 DIAGNOSIS — R000 Tachycardia, unspecified: Secondary | ICD-10-CM | POA: Diagnosis not present

## 2017-01-02 DIAGNOSIS — Z760 Encounter for issue of repeat prescription: Secondary | ICD-10-CM | POA: Insufficient documentation

## 2017-01-02 DIAGNOSIS — R42 Dizziness and giddiness: Secondary | ICD-10-CM | POA: Insufficient documentation

## 2017-01-02 DIAGNOSIS — I10 Essential (primary) hypertension: Secondary | ICD-10-CM | POA: Diagnosis present

## 2017-01-02 DIAGNOSIS — F411 Generalized anxiety disorder: Secondary | ICD-10-CM | POA: Diagnosis not present

## 2017-01-02 LAB — LIPID PANEL
Cholesterol: 239 mg/dL — ABNORMAL HIGH (ref <200)
HDL: 68 mg/dL (ref >50)
LDL CALC: 142 mg/dL — AB (ref <100)
Total CHOL/HDL Ratio: 3.5 Ratio (ref <5.0)
Triglycerides: 143 mg/dL (ref <150)
VLDL: 29 mg/dL (ref <30)

## 2017-01-02 LAB — BASIC METABOLIC PANEL
BUN: 19 mg/dL (ref 7–25)
CALCIUM: 10.1 mg/dL (ref 8.6–10.4)
CHLORIDE: 100 mmol/L (ref 98–110)
CO2: 32 mmol/L — AB (ref 20–31)
CREATININE: 0.82 mg/dL (ref 0.50–0.99)
GLUCOSE: 110 mg/dL — AB (ref 65–99)
Potassium: 3.6 mmol/L (ref 3.5–5.3)
Sodium: 142 mmol/L (ref 135–146)

## 2017-01-02 MED ORDER — LISINOPRIL-HYDROCHLOROTHIAZIDE 20-12.5 MG PO TABS: 2.0000 | ORAL_TABLET | Freq: Every day | ORAL | 3 refills | Status: DC

## 2017-01-02 MED ORDER — METOPROLOL TARTRATE 25 MG PO TABS: 25.0000 mg | ORAL_TABLET | Freq: Two times a day (BID) | ORAL | 3 refills | Status: DC

## 2017-01-02 MED ORDER — POTASSIUM CHLORIDE ER 20 MEQ PO TBCR: 20.0000 meq | EXTENDED_RELEASE_TABLET | Freq: Every day | ORAL | 0 refills | Status: DC

## 2017-01-02 MED ORDER — AMLODIPINE BESYLATE 5 MG PO TABS: 5.0000 mg | ORAL_TABLET | Freq: Every day | ORAL | 3 refills | Status: DC

## 2017-01-02 MED ORDER — ALPRAZOLAM 0.25 MG PO TABS: 0.2500 mg | ORAL_TABLET | Freq: Every evening | ORAL | 0 refills | Status: DC | PRN

## 2017-01-02 NOTE — Patient Instructions (Signed)

## 2017-01-02 NOTE — Progress Notes (Signed)
Patient is here for HTN FU  Patient denies pain at this time.  Patient has taken medication today. Patient has eaten today.

## 2017-01-02 NOTE — Progress Notes (Signed)
Subjective:  Patient ID: Stephanie [name], female    DOB: 06-23-1946  Age: 71 y.o. MRN: [id number]  CC: Hypertension  HPI Stephanie [name] is a 71 y.o. female with multiple medical conditions including hypertension, tachycardia, generalized anxiety, glaucoma status post surgery, legally blind here today for a follow up visit and medication refill. Patient's BP is elevated today but she has a hx of white coat syndrome. She logs her blood pressures at home and they are within normal ranges. She has had some episodes of low blood pressure which caused dizziness. Now she takes her bp at home first and then based on the reading takes her medication. She has been following the DASH diet and limits her salt intake.  She is about to have a lot of dental extractions and is quite anxious about her appointment. She has had her pre-appointment and will have extractions next week. She has previously taken xanax for anxiety and requests a refill today.  She denies being depressed, home environment is good. No Recent fall. No fever, no urinary symptoms. Patient has No headache, No chest pain, No abdominal pain - No Nausea, No new weakness tingling or numbness, No Cough - SOB.  Past Medical History:  Diagnosis Date  . Anemia   . Anxiety   . Blindness, legal   . Cataract   . Complication of anesthesia   . Difficulty sleeping   . Endometrial cancer ([place])   . Fibroids   . Glaucoma   . History of transfusion   . Hypertension   . PNA (pneumonia)   . PONV (postoperative nausea and vomiting)   . Vaginal bleeding    Past Surgical History:  Procedure Laterality Date  . ABDOMINAL HYSTERECTOMY N/A 07/21/2013   Procedure: TOTAL HYSTERECTOMY ABDOMINAL ;  Surgeon: Alvino Chapel, MD;  Location: [facility];  Service: Gynecology;  Laterality: N/A;  . DILATION AND CURETTAGE OF UTERUS    . LAPAROTOMY N/A 07/21/2013   Procedure: EXPLORATORY LAPAROTOMY/ PELVIC LYMPHADENECTOMY   ;  Surgeon: Alvino Chapel,  MD;  Location: [facility];  Service: Gynecology;  Laterality: N/A;  . SALPINGOOPHORECTOMY Bilateral 07/21/2013   Procedure: BILATERAL SALPINGO OOPHORECTOMY;  Surgeon: Alvino Chapel, MD;  Location: [facility];  Service: Gynecology;  Laterality: Bilateral;   Family History  Problem Relation Age of Onset  . Hypertension Father   . Vision loss Maternal Grandmother    Social History  Substance Use Topics  . Smoking status: Never Smoker  . Smokeless tobacco: Never Used  . Alcohol use No    ROS Review of Systems  Constitutional: Negative.   HENT: Positive for dental problem. Negative for sinus pain, sinus pressure, sore throat and tinnitus.   Eyes: Negative.   Respiratory: Negative.   Cardiovascular: Negative.   Gastrointestinal: Negative.   Endocrine: Negative.   Genitourinary: Negative.   Musculoskeletal: Negative.   Skin: Negative.   Neurological: Negative.   Psychiatric/Behavioral: Negative for behavioral problems, confusion, decreased concentration and sleep disturbance. The patient is nervous/anxious.     Objective:   Today's Vitals: BP (!) 165/84 (BP Location: Left Arm, Patient Position: Sitting, Cuff Size: Normal)   Pulse 100   Temp 98.2 F (36.8 C) (Oral)   Resp 18   Ht 5\' 6"  (1.676 m)   Wt 166 lb (75.3 kg)   SpO2 100%   BMI 26.79 kg/m   Physical Exam  Constitutional: She is oriented to person, place, and time. She appears well-developed and well-nourished. No distress.  HENT:  Head: Normocephalic and atraumatic.  Mouth/Throat: Oropharynx is clear and moist and mucous membranes are normal. Abnormal dentition. Dental caries present.  Eyes: Conjunctivae and EOM are normal. No scleral icterus.  Neck: Normal range of motion. No JVD present.  Cardiovascular: Regular rhythm, normal heart sounds and normal pulses.  Tachycardia present.   Pulmonary/Chest: Effort normal and breath sounds normal.  Abdominal: Soft. She exhibits no distension. There is no tenderness.    Musculoskeletal: Normal range of motion.  Neurological: She is alert and oriented to person, place, and time.  Skin: Skin is warm.  Psychiatric: Judgment and thought content normal. Her mood appears anxious.   CBC Latest Ref Rng & Units 06/18/2016 07/25/2013 07/24/2013  WBC 4.0 - 10.5 K/uL 7.9 7.5 14.9(H)  Hemoglobin 12.0 - 15.0 g/dL 14.1 8.9(L) 9.8(L)  Hematocrit 36.0 - 46.0 % 39.0 29.8(L) 32.3(L)  Platelets 150 - 400 K/uL 136(L) 210 261   CMP Latest Ref Rng & Units 06/18/2016 02/01/2015 07/25/2013  Glucose 65 - 99 mg/dL 108(H) 118(H) 97  BUN 6 - 20 mg/dL 19 20 15   Creatinine 0.44 - 1.00 mg/dL 0.82 0.83 1.09  Sodium 135 - 145 mmol/L 139 142 136  Potassium 3.5 - 5.1 mmol/L 3.0(L) 4.2 4.3  Chloride 101 - 111 mmol/L 101 101 106  CO2 22 - 32 mmol/L 27 29 24   Calcium 8.9 - 10.3 mg/dL 9.2 9.8 7.9(L)  Total Protein 6.0 - 8.3 g/dL - 8.2 -  Total Bilirubin 0.2 - 1.2 mg/dL - 0.5 -  Alkaline Phos 39 - 117 U/L - 77 -  AST 0 - 37 U/L - 15 -  ALT 0 - 35 U/L - 10 -     Assessment & Plan:   Hypertension - Well controlled on current medications despite the elevated reading in the office. We will refill her medications. She is tachycardic in the office as well but has had several cardiac workups which were unremarkable. We will also check her cholesterol today.  Hypokalemia - She had an episode of low potassium in the ER in September. She is actively eating foods high in potassium but we will check her levels today.   Generalized anxiety disorder - She is quite anxious particularly regarding health problems. She is a good advocate for her health and is making attempts to combat her anxiety to seeking care. I feel she would benefit from a refill on her xanax which she can take prior to appointments.   Abnormal dentition - she has several loose teeth on exam. She is going to see the dentist next week for extraction and dentures. We discussed speaking to the dentist regarding taking anti-anxiety  medications prior to the appointment.   Health maintenance - we discussed that she is due for mammogram and dexa scan. She does not want to do those at this time and wishes to schedule them later in the year once her dental issues resolve.   No problem-specific Assessment & Plan notes found for this encounter.   Outpatient Encounter Prescriptions as of 01/02/2017  Medication Sig  . ALPRAZolam (XANAX) 0.25 MG tablet Take 1 tablet (0.25 mg total) by mouth at bedtime as needed for anxiety.  Marland Kitchen amLODipine (NORVASC) 5 MG tablet TAKE ONE TABLET BY MOUTH ONCE DAILY  . brimonidine-timolol (COMBIGAN) 0.2-0.5 % ophthalmic solution Place 1 drop into both eyes every 12 (twelve) hours.  . cyclopentolate (CYCLODRYL,CYCLOGYL) 1 % ophthalmic solution Place 1 drop into the right eye 2 (two) times daily.   . dorzolamide (TRUSOPT)  2 % ophthalmic solution Place 1 drop into the left eye 2 (two) times daily.  Marland Kitchen latanoprost (XALATAN) 0.005 % ophthalmic solution Place 1 drop into the left eye at bedtime.   Marland Kitchen lisinopril-hydrochlorothiazide (PRINZIDE,ZESTORETIC) 20-12.5 MG tablet Take 2 tablets by mouth daily.  . metoprolol tartrate (LOPRESSOR) 25 MG tablet Take 1 tablet (25 mg total) by mouth 2 (two) times daily.  . prednisoLONE acetate (PRED FORTE) 1 % ophthalmic suspension Place 1 drop into the right eye 4 (four) times daily.   . potassium chloride 20 MEQ TBCR Take 40 mEq by mouth 2 (two) times daily.   No facility-administered encounter medications on file as of 01/02/2017.     Beckey Rutter, AGNP Student  Assessment & Plan   1. Uncontrolled hypertension  - metoprolol tartrate (LOPRESSOR) 25 MG tablet; Take 1 tablet (25 mg total) by mouth 2 (two) times daily.  Dispense: 180 tablet; Refill: 3 - lisinopril-hydrochlorothiazide (PRINZIDE,ZESTORETIC) 20-12.5 MG tablet; Take 2 tablets by mouth daily.  Dispense: 180 tablet; Refill: 3 - amLODipine (NORVASC) 5 MG tablet; Take 1 tablet (5 mg total) by mouth daily.   Dispense: 90 tablet; Refill: 3 - Basic Metabolic Panel - Lipid panel  2. Generalized anxiety disorder Refill - ALPRAZolam (XANAX) 0.25 MG tablet; Take 1 tablet (0.25 mg total) by mouth at bedtime as needed for anxiety.  Dispense: 60 tablet; Refill: 0  Patient have been counseled extensively about nutrition and exercise. Other issues discussed during this visit include: low cholesterol diet, weight control and daily exercise, importance of adherence with medications and regular follow-up. We also discussed long term complications of uncontrolled hypertension.   Return in about 6 months (around 07/05/2017) for Follow up HTN, Generalized Anxiety Disorder.  The patient was given clear instructions to go to ER or return to medical center if symptoms don't improve, worsen or new problems develop. The patient verbalized understanding. The patient was told to call to get lab results if they haven't heard anything in the next week.   This note has been created with Surveyor, quantity. Any transcriptional errors are unintentional.    Angelica Chessman, MD, [place], [place], [place], [place] and [place], [place]   01/05/2017, 5:10 PM

## 2017-01-10 ENCOUNTER — Telehealth: Payer: Self-pay | Admitting: *Deleted

## 2017-01-10 NOTE — Telephone Encounter (Signed)
Patient verified DOB Patient is aware of labs being normal except for cholesterol being high. Patient advised to limit saturated fat intake and increase fiber and exercise. Patient expressed her understanding and had no further questions.

## 2017-01-10 NOTE — Telephone Encounter (Signed)
-----   Message from Tresa Garter, MD sent at 01/08/2017 11:02 AM EDT ----- Cholesterol is high, otherwise normal labs. To address this please limit saturated fat to no more than 7% of your calories, limit cholesterol to 200 mg/day, increase fiber and exercise as tolerated. If needed we may add a cholesterol lowering medication to your regimen.

## 2017-01-18 ENCOUNTER — Ambulatory Visit: Payer: Medicare Other | Admitting: Gynecology

## 2017-01-18 ENCOUNTER — Telehealth: Payer: Self-pay | Admitting: *Deleted

## 2017-01-18 NOTE — Telephone Encounter (Signed)
Patient called and left a message that she needs to cancel her appt for today. I have called her back and cancelled the appt. Appt rescheduled to May 18 patient aware

## 2017-01-23 ENCOUNTER — Telehealth: Payer: Self-pay | Admitting: Internal Medicine

## 2017-01-23 NOTE — Telephone Encounter (Signed)
Patient called the office asking to speak with nurse in regards to her calcium medication. Pt stated that she is half way through the bottle and wants to know if she needs to continue taking medication and/or will needs refill. Please follow up.  Thank you.

## 2017-02-18 ENCOUNTER — Telehealth: Payer: Self-pay | Admitting: *Deleted

## 2017-02-18 NOTE — Telephone Encounter (Signed)
I have called and spoke with the patient regarding moving her appt up to an earlier time on 5/18. Patient will call back and confirm the appt. Appt to move from 4pm to 11:30am

## 2017-02-19 ENCOUNTER — Telehealth: Payer: Self-pay | Admitting: *Deleted

## 2017-02-19 NOTE — Telephone Encounter (Signed)
I have called the back the patient. She needs to cancel her appt for May 18th and moved it to June 1st.

## 2017-03-01 ENCOUNTER — Ambulatory Visit: Payer: Medicare Other | Admitting: Gynecology

## 2017-03-08 ENCOUNTER — Telehealth: Payer: Self-pay | Admitting: Internal Medicine

## 2017-03-08 NOTE — Telephone Encounter (Signed)
Pt. Called requesting a refill on XANAX Please f/u with pt.

## 2017-03-12 ENCOUNTER — Telehealth: Payer: Self-pay | Admitting: *Deleted

## 2017-03-12 NOTE — Telephone Encounter (Signed)
Patient caleld and rescheduled her appt form June 1st to June 15th. Patient aware of new date/time. June 15th at 8:15am.

## 2017-03-13 ENCOUNTER — Other Ambulatory Visit: Payer: Self-pay | Admitting: Internal Medicine

## 2017-03-13 DIAGNOSIS — F411 Generalized anxiety disorder: Secondary | ICD-10-CM

## 2017-03-13 MED ORDER — ALPRAZOLAM 0.25 MG PO TABS: 0.2500 mg | ORAL_TABLET | Freq: Every evening | ORAL | 0 refills | Status: DC | PRN

## 2017-03-13 NOTE — Telephone Encounter (Signed)
Refilled

## 2017-03-13 NOTE — Telephone Encounter (Signed)
PT called again requesting a refill on XANAX Please f/u with pt.

## 2017-03-15 ENCOUNTER — Ambulatory Visit: Payer: Medicare Other | Admitting: Gynecology

## 2017-03-29 ENCOUNTER — Ambulatory Visit: Payer: Medicare Other | Admitting: Gynecology

## 2017-04-26 ENCOUNTER — Ambulatory Visit: Payer: Medicare Other | Admitting: Gynecology

## 2017-05-14 ENCOUNTER — Other Ambulatory Visit: Payer: Self-pay | Admitting: Internal Medicine

## 2017-05-14 ENCOUNTER — Telehealth: Payer: Self-pay | Admitting: Internal Medicine

## 2017-05-14 DIAGNOSIS — F411 Generalized anxiety disorder: Secondary | ICD-10-CM

## 2017-05-14 MED ORDER — ALPRAZOLAM 0.25 MG PO TABS: 0.2500 mg | ORAL_TABLET | Freq: Every evening | ORAL | 0 refills | Status: DC | PRN

## 2017-05-14 NOTE — Telephone Encounter (Signed)
Patient request for xanax refill

## 2017-05-14 NOTE — Telephone Encounter (Signed)
Pt calling to request a refill of her Xanax. Please f/u. Thank you.

## 2017-05-14 NOTE — Telephone Encounter (Signed)
Refilled

## 2017-05-17 ENCOUNTER — Ambulatory Visit: Payer: Medicare Other | Attending: Gynecology | Admitting: Gynecology

## 2017-05-17 ENCOUNTER — Encounter: Payer: Self-pay | Admitting: Gynecology

## 2017-05-17 VITALS — BP 159/101 | HR 88 | Temp 97.8°F | Resp 19 | Ht 66.0 in | Wt 159.9 lb

## 2017-05-17 DIAGNOSIS — Z90722 Acquired absence of ovaries, bilateral: Secondary | ICD-10-CM | POA: Insufficient documentation

## 2017-05-17 DIAGNOSIS — Z9889 Other specified postprocedural states: Secondary | ICD-10-CM | POA: Insufficient documentation

## 2017-05-17 DIAGNOSIS — D649 Anemia, unspecified: Secondary | ICD-10-CM | POA: Insufficient documentation

## 2017-05-17 DIAGNOSIS — Z888 Allergy status to other drugs, medicaments and biological substances status: Secondary | ICD-10-CM | POA: Insufficient documentation

## 2017-05-17 DIAGNOSIS — F419 Anxiety disorder, unspecified: Secondary | ICD-10-CM | POA: Diagnosis not present

## 2017-05-17 DIAGNOSIS — Z880 Allergy status to penicillin: Secondary | ICD-10-CM | POA: Diagnosis not present

## 2017-05-17 DIAGNOSIS — H409 Unspecified glaucoma: Secondary | ICD-10-CM | POA: Insufficient documentation

## 2017-05-17 DIAGNOSIS — Z821 Family history of blindness and visual loss: Secondary | ICD-10-CM | POA: Diagnosis not present

## 2017-05-17 DIAGNOSIS — H269 Unspecified cataract: Secondary | ICD-10-CM | POA: Diagnosis not present

## 2017-05-17 DIAGNOSIS — H548 Legal blindness, as defined in USA: Secondary | ICD-10-CM | POA: Insufficient documentation

## 2017-05-17 DIAGNOSIS — I10 Essential (primary) hypertension: Secondary | ICD-10-CM | POA: Diagnosis not present

## 2017-05-17 DIAGNOSIS — Z8249 Family history of ischemic heart disease and other diseases of the circulatory system: Secondary | ICD-10-CM | POA: Insufficient documentation

## 2017-05-17 DIAGNOSIS — Z885 Allergy status to narcotic agent status: Secondary | ICD-10-CM | POA: Diagnosis not present

## 2017-05-17 DIAGNOSIS — Z8542 Personal history of malignant neoplasm of other parts of uterus: Secondary | ICD-10-CM | POA: Diagnosis not present

## 2017-05-17 DIAGNOSIS — C541 Malignant neoplasm of endometrium: Secondary | ICD-10-CM | POA: Diagnosis present

## 2017-05-17 DIAGNOSIS — Z9109 Other allergy status, other than to drugs and biological substances: Secondary | ICD-10-CM | POA: Insufficient documentation

## 2017-05-17 DIAGNOSIS — Z90710 Acquired absence of both cervix and uterus: Secondary | ICD-10-CM | POA: Diagnosis not present

## 2017-05-17 NOTE — Progress Notes (Signed)
Consult Note: Gyn-Onc   Stephanie [name] 71 y.o. female  Chief Complaint  Patient presents with  . Endometrial cancer [facility])    Assessment : Stage IA grade 2 endometrial carcinoma October 2014 no evidence of disease.  Plan:  She Will return to see me in 6 months for followup.    Interval History: The patient returns today as previously scheduled. Since her last visit she's done well. She denies any GI GU or pelvic symptoms. Her functional status is excellent. She reports that her blood pressure has been very good and that she had been placed on a low-fat diet.   HPI: Patient was initially referred with a long history of abnormal uterine bleeding. She presumed is a secondary uterine fibroids. However she was found to have a grade 1 endometrial carcinoma in August 2014 and underwent TAH BSO pelvic lymphadenectomy. Final pathology showed a stage I a grade 2 endometrial carcinoma and no adjuvant therapy was recommended. Surgery was performed on 07/21/2013.  Final pathology showed a grade 2 lesion which was superficially invasive. All lymph nodes were negative. No adjuvant therapy was recommended.  Review of Systems:10 point review of systems is negative except as noted in interval history.   Vitals: Blood pressure (!) 159/101, pulse 88, temperature 97.8 F (36.6 C), temperature source Oral, resp. rate 19, height 5\' 6"  (1.676 m), weight 159 lb 14.4 oz (72.5 kg), SpO2 100 %.  Physical Exam: General : The patient is a healthy woman in no acute distress.  HEENT: normocephalic, extraoccular movements normal; neck is supple without thyromegally  Lynphnodes: Supraclavicular and inguinal nodes not enlarged  Abdomen: Soft, non-tender, no ascites, no organomegally, no masses, no hernias , midline incision is well-healed with a keloid. Pelvic:  EGBUS: Normal female  Vagina: Normal, no lesions , vaginal cuff is well healed. Urethra and Bladder: Normal, non-tender  Cervix: Surgically absent   Uterus: Surgically absent  Bi-manual examination: Non-tender; no adenxal masses or nodularity  Rectal: normal sphincter tone, no masses, no blood  Lower extremities: No edema or varicosities. Normal range of motion      Allergies  Allergen Reactions  . Iron Anaphylaxis    IV Iron  . Iron Dextran Anaphylaxis  . Penicillins Hives and Swelling    PT, STATED ALLERGIES TO ALL " CILLINS" Tongue swelling  . Amoxicillin Other (See Comments)    Unknown reaction  . Ampicillin Other (See Comments)    Unknown reaction  . Codeine Nausea And Vomiting  . Diamox [Acetazolamide] Nausea And Vomiting, Diarrhea and Nausea Only    Other reaction(s): Cramps (ALLERGY/intolerance) Severe nausea and vomitting  . Penicillin G Swelling and Hives  . Other Nausea Only    Past Medical History:  Diagnosis Date  . Anemia   . Anxiety   . Blindness, legal   . Cataract   . Complication of anesthesia   . Difficulty sleeping   . Endometrial cancer ([place])   . Fibroids   . Glaucoma   . History of transfusion   . Hypertension   . PNA (pneumonia)   . PONV (postoperative nausea and vomiting)   . Vaginal bleeding     Past Surgical History:  Procedure Laterality Date  . ABDOMINAL HYSTERECTOMY N/A 07/21/2013   Procedure: TOTAL HYSTERECTOMY ABDOMINAL ;  Surgeon: Alvino Chapel, MD;  Location: [facility];  Service: Gynecology;  Laterality: N/A;  . DILATION AND CURETTAGE OF UTERUS    . LAPAROTOMY N/A 07/21/2013   Procedure: EXPLORATORY LAPAROTOMY/ PELVIC LYMPHADENECTOMY   ;  Surgeon: Alvino Chapel, MD;  Location: [facility];  Service: Gynecology;  Laterality: N/A;  . SALPINGOOPHORECTOMY Bilateral 07/21/2013   Procedure: BILATERAL SALPINGO OOPHORECTOMY;  Surgeon: Alvino Chapel, MD;  Location: [facility];  Service: Gynecology;  Laterality: Bilateral;    Current Outpatient Prescriptions  Medication Sig Dispense Refill  . ALPRAZolam (XANAX) 0.25 MG tablet Take 1 tablet (0.25 mg total) by mouth  at bedtime as needed for anxiety. 60 tablet 0  . amLODipine (NORVASC) 5 MG tablet Take 1 tablet (5 mg total) by mouth daily. 90 tablet 3  . latanoprost (XALATAN) 0.005 % ophthalmic solution Place 1 drop into the left eye at bedtime.     Marland Kitchen lisinopril-hydrochlorothiazide (PRINZIDE,ZESTORETIC) 20-12.5 MG tablet Take 2 tablets by mouth daily. 180 tablet 3  . metoprolol tartrate (LOPRESSOR) 25 MG tablet Take 1 tablet (25 mg total) by mouth 2 (two) times daily. 180 tablet 3  . Netarsudil Dimesylate 0.02 % SOLN Apply to eye.    . prednisoLONE acetate (PRED FORTE) 1 % ophthalmic suspension Place 1 drop into the right eye 4 (four) times daily.      No current facility-administered medications for this visit.     Social History   Social History  . Marital status: Divorced    Spouse name: N/A  . Number of children: N/A  . Years of education: N/A   Occupational History  . Not on file.   Social History Main Topics  . Smoking status: Never Smoker  . Smokeless tobacco: Never Used  . Alcohol use No  . Drug use: No  . Sexual activity: Not Currently    Birth control/ protection: None   Other Topics Concern  . Not on file   Social History Narrative  . No narrative on file    Family History  Problem Relation Age of Onset  . Hypertension Father   . Vision loss Maternal Grandmother       [name], MD 05/17/2017, 10:33 AM

## 2017-05-17 NOTE — Patient Instructions (Signed)
Dr. Cindee Lame wants to see you back in 6 months.  That would be February of 2019.  Please call in November 2018 to schedule the appointment.

## 2018-01-06 ENCOUNTER — Telehealth: Payer: Self-pay | Admitting: Internal Medicine

## 2018-01-06 NOTE — Telephone Encounter (Signed)
Pt called to request a refill on  -lisinopril-hydrochlorothiazide (PRINZIDE,ZESTORETIC) 20-12.5 MG tablet -metoprolol tartrate (LOPRESSOR) 25 MG tablet  -amLODipine (NORVASC) 5 MG tablet  Please follow up And send

## 2018-01-08 ENCOUNTER — Other Ambulatory Visit: Payer: Self-pay | Admitting: Internal Medicine

## 2018-01-08 DIAGNOSIS — I10 Essential (primary) hypertension: Secondary | ICD-10-CM

## 2018-01-08 NOTE — Telephone Encounter (Signed)
[facility] on [address]

## 2018-01-18 ENCOUNTER — Other Ambulatory Visit: Payer: Self-pay | Admitting: Internal Medicine

## 2018-01-18 DIAGNOSIS — I10 Essential (primary) hypertension: Secondary | ICD-10-CM

## 2018-01-18 MED ORDER — METOPROLOL TARTRATE 25 MG PO TABS: 25.0000 mg | ORAL_TABLET | Freq: Two times a day (BID) | ORAL | 3 refills | Status: DC

## 2018-01-18 MED ORDER — LISINOPRIL-HYDROCHLOROTHIAZIDE 20-12.5 MG PO TABS: 2.0000 | ORAL_TABLET | Freq: Every day | ORAL | 3 refills | Status: DC

## 2018-01-18 MED ORDER — AMLODIPINE BESYLATE 5 MG PO TABS: 5.0000 mg | ORAL_TABLET | Freq: Every day | ORAL | 3 refills | Status: DC

## 2018-01-18 NOTE — Telephone Encounter (Signed)
Refilled

## 2018-02-14 ENCOUNTER — Ambulatory Visit: Payer: Medicare Other | Admitting: Family Medicine

## 2018-06-15 ENCOUNTER — Other Ambulatory Visit: Payer: Self-pay | Admitting: Internal Medicine

## 2018-06-15 DIAGNOSIS — I10 Essential (primary) hypertension: Secondary | ICD-10-CM

## 2018-06-19 ENCOUNTER — Telehealth: Payer: Self-pay | Admitting: Internal Medicine

## 2018-06-19 DIAGNOSIS — I10 Essential (primary) hypertension: Secondary | ICD-10-CM

## 2018-06-19 MED ORDER — LISINOPRIL-HYDROCHLOROTHIAZIDE 20-12.5 MG PO TABS: 2.0000 | ORAL_TABLET | Freq: Every day | ORAL | 0 refills | Status: DC

## 2018-06-19 NOTE — Telephone Encounter (Signed)
lisinopril-hydrochlorothiazide (PRINZIDE,ZESTORETIC) 20-12.5 MG tablet [[id number]]    Patient would like refills of medication, patient has appointment scheduled 07/09/18. Patient uses [facility] in [place] on [address].

## 2018-07-09 ENCOUNTER — Ambulatory Visit: Payer: Medicare Other | Attending: Family Medicine | Admitting: Family Medicine

## 2018-07-09 ENCOUNTER — Encounter: Payer: Self-pay | Admitting: Family Medicine

## 2018-07-09 VITALS — BP 150/80 | HR 105 | Temp 98.5°F | Wt 152.6 lb

## 2018-07-09 DIAGNOSIS — F411 Generalized anxiety disorder: Secondary | ICD-10-CM | POA: Insufficient documentation

## 2018-07-09 DIAGNOSIS — Z880 Allergy status to penicillin: Secondary | ICD-10-CM | POA: Insufficient documentation

## 2018-07-09 DIAGNOSIS — Z885 Allergy status to narcotic agent status: Secondary | ICD-10-CM | POA: Insufficient documentation

## 2018-07-09 DIAGNOSIS — Z23 Encounter for immunization: Secondary | ICD-10-CM | POA: Diagnosis not present

## 2018-07-09 DIAGNOSIS — C541 Malignant neoplasm of endometrium: Secondary | ICD-10-CM | POA: Diagnosis not present

## 2018-07-09 DIAGNOSIS — Z90722 Acquired absence of ovaries, bilateral: Secondary | ICD-10-CM | POA: Insufficient documentation

## 2018-07-09 DIAGNOSIS — I10 Essential (primary) hypertension: Secondary | ICD-10-CM | POA: Diagnosis not present

## 2018-07-09 DIAGNOSIS — H409 Unspecified glaucoma: Secondary | ICD-10-CM | POA: Insufficient documentation

## 2018-07-09 DIAGNOSIS — Z90710 Acquired absence of both cervix and uterus: Secondary | ICD-10-CM | POA: Diagnosis not present

## 2018-07-09 DIAGNOSIS — Z79899 Other long term (current) drug therapy: Secondary | ICD-10-CM | POA: Diagnosis not present

## 2018-07-09 DIAGNOSIS — H548 Legal blindness, as defined in USA: Secondary | ICD-10-CM | POA: Insufficient documentation

## 2018-07-09 MED ORDER — AMLODIPINE BESYLATE 5 MG PO TABS: 5.0000 mg | ORAL_TABLET | Freq: Every day | ORAL | 1 refills | Status: DC

## 2018-07-09 MED ORDER — LISINOPRIL-HYDROCHLOROTHIAZIDE 20-12.5 MG PO TABS: 2.0000 | ORAL_TABLET | Freq: Every day | ORAL | 1 refills | Status: DC

## 2018-07-09 MED ORDER — METOPROLOL TARTRATE 25 MG PO TABS: 25.0000 mg | ORAL_TABLET | Freq: Two times a day (BID) | ORAL | 1 refills | Status: DC

## 2018-07-09 MED ORDER — HYDROXYZINE HCL 25 MG PO TABS: 25.0000 mg | ORAL_TABLET | Freq: Three times a day (TID) | ORAL | 1 refills | Status: DC | PRN

## 2018-07-09 NOTE — Progress Notes (Signed)
Subjective:  Patient ID: Stephanie [name], female    DOB: May 10, 1946  Age: 72 y.o. MRN: [id number]  CC: Hypertension   HPI Stephanie [name] is a 72 year old female with a history of hypertension, generalized anxiety disorder, glaucoma, endometrial carcinoma (stage Ia grade 2 diagnosed in 2014 status post TAH/BSO pelvic lymphadenectomy on 07/2013) who presents today to establish care with me Her blood pressure is elevated today but she provides me with her home blood pressure log which reveals blood pressures in the 110/90 range and she informs me elevation is due to whitecoat hypertension.  She states she has to take Xanax which she received from her previous PCP prior to her doctor appointments to enable her calm down as she suffers from anxiety. Her glaucoma is followed by ophthalmology at [facility] and for her endometrial carcinoma she was last seen by GYN on 05/2017. Her last office visit to this clinic was 18 months ago which is requesting refills on her medications.  Past Medical History:  Diagnosis Date  . Anemia   . Anxiety   . Blindness, legal   . Cataract   . Complication of anesthesia   . Difficulty sleeping   . Endometrial cancer ([place])   . Fibroids   . Glaucoma   . History of transfusion   . Hypertension   . PNA (pneumonia)   . PONV (postoperative nausea and vomiting)   . Vaginal bleeding     Past Surgical History:  Procedure Laterality Date  . ABDOMINAL HYSTERECTOMY N/A 07/21/2013   Procedure: TOTAL HYSTERECTOMY ABDOMINAL ;  Surgeon: Alvino Chapel, MD;  [facility];  Service: Gynecology;  Laterality: N/A;  . DILATION AND CURETTAGE OF UTERUS    . LAPAROTOMY N/A 07/21/2013   Procedure: EXPLORATORY LAPAROTOMY/ PELVIC LYMPHADENECTOMY   ;  Surgeon: Alvino Chapel, MD;  [facility];  Service: Gynecology;  Laterality: N/A;  . SALPINGOOPHORECTOMY Bilateral 07/21/2013   Procedure: BILATERAL SALPINGO OOPHORECTOMY;  Surgeon:  Alvino Chapel, MD;  [facility];  Service: Gynecology;  Laterality: Bilateral;    Allergies  Allergen Reactions  . Iron Anaphylaxis    IV Iron  . Iron Dextran Anaphylaxis  . Penicillins Hives and Swelling    PT, STATED ALLERGIES TO ALL " CILLINS" Tongue swelling  . Amoxicillin Other (See Comments)    Unknown reaction  . Ampicillin Other (See Comments)    Unknown reaction  . Codeine Nausea And Vomiting  . Diamox [Acetazolamide] Nausea And Vomiting, Diarrhea and Nausea Only    Other reaction(s): Cramps (ALLERGY/intolerance) Severe nausea and vomitting  . Penicillin G Swelling and Hives  . Other Nausea Only     Outpatient Medications Prior to Visit  Medication Sig Dispense Refill  . brimonidine (ALPHAGAN P) 0.1 % SOLN Apply to eye.    . latanoprost (XALATAN) 0.005 % ophthalmic solution Place 1 drop into the left eye at bedtime.     . prednisoLONE acetate (PRED FORTE) 1 % ophthalmic suspension Place 1 drop into the right eye 4 (four) times daily.     Marland Kitchen ALPRAZolam (XANAX) 0.25 MG tablet Take 1 tablet (0.25 mg total) by mouth at bedtime as needed for anxiety. 60 tablet 0  . amLODipine (NORVASC) 5 MG tablet Take 1 tablet (5 mg total) by mouth daily. 90 tablet 3  . lisinopril-hydrochlorothiazide (PRINZIDE,ZESTORETIC) 20-12.5 MG tablet Take 2 tablets by mouth daily. Must keep appt on 07/09/18 for further refills 60 tablet 0  . metoprolol tartrate (LOPRESSOR)  25 MG tablet Take 1 tablet (25 mg total) by mouth 2 (two) times daily. 180 tablet 3  . Netarsudil Dimesylate 0.02 % SOLN Apply to eye.     No facility-administered medications prior to visit.     ROS Review of Systems  Constitutional: Negative for activity change, appetite change and fatigue.  HENT: Negative for congestion, sinus pressure and sore throat.   Eyes: Negative for visual disturbance.  Respiratory: Negative for cough, chest tightness, shortness of breath and wheezing.   Cardiovascular: Negative for  chest pain and palpitations.  Gastrointestinal: Negative for abdominal distention, abdominal pain and constipation.  Endocrine: Negative for polydipsia.  Genitourinary: Negative for dysuria and frequency.  Musculoskeletal: Negative for arthralgias and back pain.  Skin: Negative for rash.  Neurological: Negative for tremors, light-headedness and numbness.  Hematological: Does not bruise/bleed easily.  Psychiatric/Behavioral: Negative for agitation and behavioral problems.    Objective:  BP (!) 166/116   Pulse (!) 105   Temp 98.5 F (36.9 C) (Oral)   Wt 152 lb 9.6 oz (69.2 kg)   SpO2 99%   BMI 24.63 kg/m   BP/Weight 07/09/2018 05/17/2017 9/37/1696  Systolic BP 789 381 017  Diastolic BP 510 258 84  Wt. (Lbs) 152.6 159.9 166  BMI 24.63 25.81 26.79      Physical Exam  Constitutional: She is oriented to person, place, and time. She appears well-developed and well-nourished.  HENT:  Right Ear: External ear normal.  Left Ear: External ear normal.  Mouth/Throat: Oropharynx is clear and moist.  Cardiovascular: Normal rate, normal heart sounds and intact distal pulses.  No murmur heard. Pulmonary/Chest: Effort normal and breath sounds normal. She has no wheezes. She has no rales. She exhibits no tenderness.  Abdominal: Soft. Bowel sounds are normal. She exhibits no distension and no mass. There is no tenderness.  Musculoskeletal: Normal range of motion.  Neurological: She is alert and oriented to person, place, and time.  Skin: Skin is warm and dry.  Psychiatric: She has a normal mood and affect.     Assessment & Plan:   1. Uncontrolled hypertension Uncontrolled blood home blood pressure logs have been controlled She attributes this to white coat syndrome No regimen change today Counseled on blood pressure goal of less than 130/80, low-sodium, DASH diet, medication compliance, 150 minutes of moderate intensity exercise per week. Discussed medication compliance, adverse  effects. - CMP14+EGFR - metoprolol tartrate (LOPRESSOR) 25 MG tablet; Take 1 tablet (25 mg total) by mouth 2 (two) times daily.  Dispense: 180 tablet; Refill: 1 - lisinopril-hydrochlorothiazide (PRINZIDE,ZESTORETIC) 20-12.5 MG tablet; Take 2 tablets by mouth daily. Must keep appt on 07/09/18 for further refills  Dispense: 180 tablet; Refill: 1 - amLODipine (NORVASC) 5 MG tablet; Take 1 tablet (5 mg total) by mouth daily.  Dispense: 90 tablet; Refill: 1  2. Generalized anxiety disorder Discontinued Xanax and I have discussed habit-forming nature of Xanax and the fact that this should not be used as long-term as management for anxiety Anxiety arises when she has the cause appointments and she can use hydroxyzine as needed - hydrOXYzine (ATARAX/VISTARIL) 25 MG tablet; Take 1 tablet (25 mg total) by mouth every 8 (eight) hours as needed for anxiety.  Dispense: 30 tablet; Refill: 1  3. Endometrial cancer ([place]) Stable Followed by GYN oncology  4. White coat syndrome with diagnosis of hypertension She will take hydroxyzine prior to her office visits   Meds ordered this encounter  Medications  . hydrOXYzine (ATARAX/VISTARIL) 25 MG tablet  Sig: Take 1 tablet (25 mg total) by mouth every 8 (eight) hours as needed for anxiety.    Dispense:  30 tablet    Refill:  1  . metoprolol tartrate (LOPRESSOR) 25 MG tablet    Sig: Take 1 tablet (25 mg total) by mouth 2 (two) times daily.    Dispense:  180 tablet    Refill:  1    Please consider 90 day supplies to promote better adherence  . lisinopril-hydrochlorothiazide (PRINZIDE,ZESTORETIC) 20-12.5 MG tablet    Sig: Take 2 tablets by mouth daily. Must keep appt on 07/09/18 for further refills    Dispense:  180 tablet    Refill:  1  . amLODipine (NORVASC) 5 MG tablet    Sig: Take 1 tablet (5 mg total) by mouth daily.    Dispense:  90 tablet    Refill:  1    Follow-up: Return in about 6 months (around 01/07/2019) for follow up of chronic medical  conditions.   Charlott Rakes MD

## 2018-07-09 NOTE — Patient Instructions (Signed)

## 2018-07-10 ENCOUNTER — Other Ambulatory Visit: Payer: Self-pay | Admitting: Family Medicine

## 2018-07-10 LAB — CMP14+EGFR
A/G RATIO: 1.4 (ref 1.2–2.2)
ALT: 10 IU/L (ref 0–32)
AST: 20 IU/L (ref 0–40)
Albumin: 4.4 g/dL (ref 3.5–4.8)
Alkaline Phosphatase: 57 IU/L (ref 39–117)
BILIRUBIN TOTAL: 0.9 mg/dL (ref 0.0–1.2)
BUN/Creatinine Ratio: 18 (ref 12–28)
BUN: 16 mg/dL (ref 8–27)
CHLORIDE: 89 mmol/L — AB (ref 96–106)
CO2: 24 mmol/L (ref 20–29)
Calcium: 9.2 mg/dL (ref 8.7–10.3)
Creatinine, Ser: 0.91 mg/dL (ref 0.57–1.00)
GFR calc non Af Amer: 64 mL/min/{1.73_m2} (ref >59)
GFR, EST AFRICAN AMERICAN: 74 mL/min/{1.73_m2} (ref >59)
Globulin, Total: 3.1 g/dL (ref 1.5–4.5)
Glucose: 193 mg/dL — ABNORMAL HIGH (ref 65–99)
POTASSIUM: 2.8 mmol/L — AB (ref 3.5–5.2)
Sodium: 132 mmol/L — ABNORMAL LOW (ref 134–144)
Total Protein: 7.5 g/dL (ref 6.0–8.5)

## 2018-07-10 MED ORDER — POTASSIUM CHLORIDE ER 20 MEQ PO TBCR: 20.0000 meq | EXTENDED_RELEASE_TABLET | Freq: Every day | ORAL | 3 refills | Status: DC

## 2018-07-11 ENCOUNTER — Telehealth: Payer: Self-pay

## 2018-07-11 NOTE — Telephone Encounter (Signed)
-----   Message from Charlott Rakes, MD sent at 07/10/2018  2:06 PM EDT ----- Potassium is low.  I have sent a prescription for potassium replacement to her pharmacy

## 2018-07-11 NOTE — Telephone Encounter (Signed)
Patient was called and informed of lab results and medication being sent to pharmacy. 

## 2018-09-24 DIAGNOSIS — H401133 Primary open-angle glaucoma, bilateral, severe stage: Secondary | ICD-10-CM | POA: Diagnosis not present

## 2018-10-06 ENCOUNTER — Telehealth: Payer: Self-pay | Admitting: Family Medicine

## 2018-10-06 NOTE — Telephone Encounter (Signed)
1) Medication(s) Requested (by name): Amlodipine hydrochlorothiazide 2) Pharmacy of Choice:  [facility] [address]

## 2018-10-06 NOTE — Telephone Encounter (Signed)
Pt has refills available at her preferred pharmacy, she should contact them for refills.

## 2018-11-07 ENCOUNTER — Other Ambulatory Visit: Payer: Self-pay | Admitting: Pharmacist

## 2018-11-07 DIAGNOSIS — I10 Essential (primary) hypertension: Secondary | ICD-10-CM

## 2018-11-07 MED ORDER — LISINOPRIL-HYDROCHLOROTHIAZIDE 20-12.5 MG PO TABS: 2.0000 | ORAL_TABLET | Freq: Every day | ORAL | 0 refills | Status: DC

## 2018-12-10 ENCOUNTER — Telehealth: Payer: Self-pay | Admitting: Family Medicine

## 2018-12-10 DIAGNOSIS — F411 Generalized anxiety disorder: Secondary | ICD-10-CM

## 2018-12-11 NOTE — Telephone Encounter (Signed)
New Message   1) Medication(s) Requested (by name): hydrOXYzine (ATARAX/VISTARIL) 25 MG tablet  2) Pharmacy of Choice: [facility] on [address]  3) Special Requests:   Approved medications will be sent to the pharmacy, we will reach out if there is an issue.  Requests made after 3pm may not be addressed until the following business day!  If a patient is unsure of the name of the medication(s) please note and ask patient to call back when they are able to provide all info, do not send to responsible party until all information is available!

## 2018-12-12 MED ORDER — HYDROXYZINE HCL 25 MG PO TABS: 25.0000 mg | ORAL_TABLET | Freq: Three times a day (TID) | ORAL | 1 refills | Status: DC | PRN

## 2019-01-03 ENCOUNTER — Other Ambulatory Visit: Payer: Self-pay | Admitting: Family Medicine

## 2019-01-03 DIAGNOSIS — I10 Essential (primary) hypertension: Secondary | ICD-10-CM

## 2019-01-05 NOTE — Telephone Encounter (Signed)
Refill given 11/07/18 with instructions to make appt for more refills. Pt did not do as instructed and has not been seen since 07/09/18. Will forward request to PCP for review.

## 2019-02-04 ENCOUNTER — Other Ambulatory Visit: Payer: Self-pay | Admitting: Internal Medicine

## 2019-02-04 DIAGNOSIS — I10 Essential (primary) hypertension: Secondary | ICD-10-CM

## 2019-02-05 ENCOUNTER — Other Ambulatory Visit: Payer: Self-pay

## 2019-02-05 NOTE — Patient Outreach (Signed)
[facility]) Care Management  02/05/2019  Stephanie [name] 1946/07/07 [id number]   Medication Adherence call to Stephanie [name] HIPPA Compliant Voice message left with a call back number. Stephanie [name] is showing past due on Lisinopril /Hctz 20 /12.5 mg under [name].   [place] Management Direct Dial [phone]  Fax [phone] [email or web address]

## 2019-02-06 ENCOUNTER — Other Ambulatory Visit: Payer: Self-pay | Admitting: Internal Medicine

## 2019-02-06 ENCOUNTER — Other Ambulatory Visit: Payer: Self-pay | Admitting: Pharmacist

## 2019-02-06 DIAGNOSIS — I10 Essential (primary) hypertension: Secondary | ICD-10-CM

## 2019-02-06 MED ORDER — LISINOPRIL-HYDROCHLOROTHIAZIDE 20-12.5 MG PO TABS: 2.0000 | ORAL_TABLET | Freq: Every day | ORAL | 0 refills | Status: DC

## 2019-02-17 ENCOUNTER — Encounter: Payer: Self-pay | Admitting: Family Medicine

## 2019-02-17 ENCOUNTER — Ambulatory Visit: Payer: Medicare Other | Attending: Family Medicine | Admitting: Family Medicine

## 2019-02-17 ENCOUNTER — Other Ambulatory Visit: Payer: Self-pay

## 2019-02-17 DIAGNOSIS — F411 Generalized anxiety disorder: Secondary | ICD-10-CM | POA: Diagnosis not present

## 2019-02-17 DIAGNOSIS — I10 Essential (primary) hypertension: Secondary | ICD-10-CM

## 2019-02-17 DIAGNOSIS — E876 Hypokalemia: Secondary | ICD-10-CM

## 2019-02-17 MED ORDER — HYDROXYZINE HCL 25 MG PO TABS: 25.0000 mg | ORAL_TABLET | Freq: Three times a day (TID) | ORAL | 1 refills | Status: DC | PRN

## 2019-02-17 MED ORDER — METOPROLOL TARTRATE 25 MG PO TABS: 25.0000 mg | ORAL_TABLET | Freq: Two times a day (BID) | ORAL | 1 refills | Status: DC

## 2019-02-17 MED ORDER — AMLODIPINE BESYLATE 10 MG PO TABS: 10.0000 mg | ORAL_TABLET | Freq: Every day | ORAL | 1 refills | Status: DC

## 2019-02-17 MED ORDER — LISINOPRIL 40 MG PO TABS: 40.0000 mg | ORAL_TABLET | Freq: Every day | ORAL | 1 refills | Status: DC

## 2019-02-17 NOTE — Progress Notes (Signed)
Virtual Visit via Telephone Note  I connected with Dorethea Clan, on 02/17/2019 at 10:40 AM by telephone and verified that I am speaking with the correct person using two identifiers.   Consent: I discussed the limitations, risks, security and privacy concerns of performing an evaluation and management service by telephone and the availability of in person appointments. I also discussed with the patient that there may be a patient responsible charge related to this service. The patient expressed understanding and agreed to proceed.   Location of Patient: Home  Location of Provider: Clinic   Persons participating in Telemedicine visit: CASSADY STANCZAK     History of Present Illness: Stephanie [name] is a 73 year old female with a history of hypertension, generalized anxiety disorder, glaucoma, endometrial carcinoma (stage Ia grade 2 diagnosed in 2014 status post TAH/BSO pelvic lymphadenectomy on 07/2013) seen for an office visit today. She checked her vitals today and reports a blood pressure of 115/72, heart rate of 70. Endorses compliance with her antihypertensive and should be on potassium however she ran out 3 months ago and has not been on it ever since. She was commenced on hydroxyzine at her last office visit as a substitution for Xanax which she was taking due to anxiety and reports improvement in anxiety symptoms and she loves hydroxyzine except that it makes her sleepy. She endorses having a sinus infection 3 weeks ago with sinus drainage, headache, chills and no fever for 2 days but that has resolved at this time. She has no acute concerns at this time.  Past Medical History:  Diagnosis Date  . Anemia   . Anxiety   . Blindness, legal   . Cataract   . Complication of anesthesia   . Difficulty sleeping   . Endometrial cancer ([place])   . Fibroids   . Glaucoma   . History of transfusion   . Hypertension   . PNA (pneumonia)   . PONV (postoperative nausea and vomiting)    . Vaginal bleeding    Allergies  Allergen Reactions  . Iron Anaphylaxis    IV Iron  . Iron Dextran Anaphylaxis  . Penicillins Hives and Swelling    PT, STATED ALLERGIES TO ALL " CILLINS" Tongue swelling  . Amoxicillin Other (See Comments)    Unknown reaction  . Ampicillin Other (See Comments)    Unknown reaction  . Codeine Nausea And Vomiting  . Diamox [Acetazolamide] Nausea And Vomiting, Diarrhea and Nausea Only    Other reaction(s): Cramps (ALLERGY/intolerance) Severe nausea and vomitting  . Penicillin G Swelling and Hives  . Other Nausea Only    Current Outpatient Medications on File Prior to Visit  Medication Sig Dispense Refill  . amLODipine (NORVASC) 5 MG tablet Take 1 tablet (5 mg total) by mouth daily. 90 tablet 1  . brimonidine (ALPHAGAN P) 0.1 % SOLN Apply to eye.    . hydrOXYzine (ATARAX/VISTARIL) 25 MG tablet Take 1 tablet (25 mg total) by mouth every 8 (eight) hours as needed for anxiety. 30 tablet 1  . latanoprost (XALATAN) 0.005 % ophthalmic solution Place 1 drop into the left eye at bedtime.     Marland Kitchen lisinopril-hydrochlorothiazide (ZESTORETIC) 20-12.5 MG tablet Take 2 tablets by mouth daily. Must keep appt in May for more refills. 60 tablet 0  . metoprolol tartrate (LOPRESSOR) 25 MG tablet Take 1 tablet (25 mg total) by mouth 2 (two) times daily. 180 tablet 1  . Netarsudil Dimesylate 0.02 % SOLN Apply to eye.    Marland Kitchen  Potassium Chloride ER 20 MEQ TBCR Take 20 mEq by mouth daily. 30 tablet 3  . prednisoLONE acetate (PRED FORTE) 1 % ophthalmic suspension Place 1 drop into the right eye 4 (four) times daily.      No current facility-administered medications on file prior to visit.     Observations/Objective: Alert, awake, oriented x3 Not in acute distress   CMP Latest Ref Rng & Units 07/09/2018 01/02/2017 06/18/2016  Glucose 65 - 99 mg/dL 193(H) 110(H) 108(H)  BUN 8 - 27 mg/dL '16 19 19  ' Creatinine 0.57 - 1.00 mg/dL 0.91 0.82 0.82  Sodium 134 - 144 mmol/L 132(L) 142  139  Potassium 3.5 - 5.2 mmol/L 2.8(L) 3.6 3.0(L)  Chloride 96 - 106 mmol/L 89(L) 100 101  CO2 20 - 29 mmol/L 24 32(H) 27  Calcium 8.7 - 10.3 mg/dL 9.2 10.1 9.2  Total Protein 6.0 - 8.5 g/dL 7.5 - -  Total Bilirubin 0.0 - 1.2 mg/dL 0.9 - -  Alkaline Phos 39 - 117 IU/L 57 - -  AST 0 - 40 IU/L 20 - -  ALT 0 - 32 IU/L 10 - -    Lipid Panel     Component Value Date/Time   CHOL 239 (H) 01/02/2017 1159   TRIG 143 01/02/2017 1159   HDL 68 01/02/2017 1159   CHOLHDL 3.5 01/02/2017 1159   VLDL 29 01/02/2017 1159   LDLCALC 142 (H) 01/02/2017 1159    The 10-year ASCVD risk score Mikey Bussing DC Jr., et al., 2013) is: 18.9%   Values used to calculate the score:     Age: 56 years     Sex: Female     Is Non-Hispanic African American: Yes     Diabetic: No     Tobacco smoker: No     Systolic Blood Pressure: 893 mmHg     Is BP treated: Yes     HDL Cholesterol: 68 mg/dL     Total Cholesterol: 239 mg/dL  Assessment and Plan: 1. Controlled hypertension Discontinue Lisinopril/HCTZ and commence Lisinopril due to hypokalemia Increase Amlodipine dose Counseled on blood pressure goal of less than 130/80, low-sodium, DASH diet, medication compliance, 150 minutes of moderate intensity exercise per week. Discussed medication compliance, adverse effects. - amLODipine (NORVASC) 10 MG tablet; Take 1 tablet (10 mg total) by mouth daily.  Dispense: 90 tablet; Refill: 1 - metoprolol tartrate (LOPRESSOR) 25 MG tablet; Take 1 tablet (25 mg total) by mouth 2 (two) times daily.  Dispense: 180 tablet; Refill: 1 - CMP14+EGFR; Future - Lipid panel; Future  2. Generalized anxiety disorder Controlled - hydrOXYzine (ATARAX/VISTARIL) 25 MG tablet; Take 1 tablet (25 mg total) by mouth every 8 (eight) hours as needed for anxiety.  Dispense: 30 tablet; Refill: 1  3. Hypokalemia Last potassium was 2.8 She has been out of potassium for 3 months Hopefully discontinuation of HCTZ will be beneficial Will check potassium in  two weeks and commence KCl if needed  Increased ASCVD risk - consider initiating statin at next visit  Follow Up Instructions: Return in about 3 months (around 05/20/2019).    I discussed the assessment and treatment plan with the patient. The patient was provided an opportunity to ask questions and all were answered. The patient agreed with the plan and demonstrated an understanding of the instructions.   The patient was advised to call back or seek an in-person evaluation if the symptoms worsen or if the condition fails to improve as anticipated.     I provided 15 minutes  total of non-face-to-face time during this encounter including median intraservice time, reviewing previous notes, labs, imaging, medications and explaining diagnosis and management.     Charlott Rakes, MD, FAAFP. [facility] and [facility], [place]   02/17/2019, 10:40 AM

## 2019-02-17 NOTE — Progress Notes (Signed)
Patient has been called and DOB has been verified. Patient has been screened and transferred to PCP to start phone visit.  C/C: Hypertension    

## 2019-03-03 ENCOUNTER — Other Ambulatory Visit: Payer: Self-pay

## 2019-03-03 ENCOUNTER — Ambulatory Visit: Payer: Medicare Other | Attending: Family Medicine

## 2019-03-03 DIAGNOSIS — I10 Essential (primary) hypertension: Secondary | ICD-10-CM | POA: Diagnosis not present

## 2019-03-04 ENCOUNTER — Other Ambulatory Visit: Payer: Self-pay | Admitting: Family Medicine

## 2019-03-04 LAB — CMP14+EGFR
ALT: 13 IU/L (ref 0–32)
AST: 13 IU/L (ref 0–40)
Albumin/Globulin Ratio: 1.4 (ref 1.2–2.2)
Albumin: 4.7 g/dL (ref 3.8–4.8)
Alkaline Phosphatase: 100 IU/L (ref 39–117)
BUN/Creatinine Ratio: 27 (ref 12–28)
BUN: 25 mg/dL (ref 8–27)
Bilirubin Total: 0.5 mg/dL (ref 0.0–1.2)
CO2: 27 mmol/L (ref 20–29)
Calcium: 9.6 mg/dL (ref 8.7–10.3)
Chloride: 98 mmol/L (ref 96–106)
Creatinine, Ser: 0.91 mg/dL (ref 0.57–1.00)
GFR calc Af Amer: 74 mL/min/{1.73_m2} (ref >59)
GFR calc non Af Amer: 64 mL/min/{1.73_m2} (ref >59)
Globulin, Total: 3.3 g/dL (ref 1.5–4.5)
Glucose: 115 mg/dL — ABNORMAL HIGH (ref 65–99)
Potassium: 3.5 mmol/L (ref 3.5–5.2)
Sodium: 139 mmol/L (ref 134–144)
Total Protein: 8 g/dL (ref 6.0–8.5)

## 2019-03-04 LAB — LIPID PANEL
Chol/HDL Ratio: 3.4 ratio (ref 0.0–4.4)
Cholesterol, Total: 238 mg/dL — ABNORMAL HIGH (ref 100–199)
HDL: 71 mg/dL (ref >39)
LDL Calculated: 142 mg/dL — ABNORMAL HIGH (ref 0–99)
Triglycerides: 126 mg/dL (ref 0–149)
VLDL Cholesterol Cal: 25 mg/dL (ref 5–40)

## 2019-03-04 MED ORDER — ATORVASTATIN CALCIUM 20 MG PO TABS: 20.0000 mg | ORAL_TABLET | Freq: Every day | ORAL | 3 refills | Status: DC

## 2019-03-05 ENCOUNTER — Telehealth: Payer: Self-pay

## 2019-03-05 ENCOUNTER — Telehealth: Payer: Self-pay | Admitting: Family Medicine

## 2019-03-05 NOTE — Telephone Encounter (Signed)
Patient states that she is having swelling in her ankles since she has stopped her HCTZ.

## 2019-03-05 NOTE — Telephone Encounter (Signed)
Patient was called and informed of lab results and medication being sent to pharmacy. 

## 2019-03-05 NOTE — Telephone Encounter (Signed)
Patient called to get their results due to being prescribed a medication for cholesterol. Please follow up.

## 2019-03-05 NOTE — Telephone Encounter (Signed)
-----   Message from Charlott Rakes, MD sent at 03/04/2019  1:39 PM EDT ----- Cholesterol is elevated. I have sent in a rx for Atorvastatin to her pharmacy. Advise on complying with a low cholesterol diet, exercise.

## 2019-03-11 ENCOUNTER — Encounter: Payer: Self-pay | Admitting: Family Medicine

## 2019-03-11 ENCOUNTER — Other Ambulatory Visit: Payer: Self-pay

## 2019-03-11 ENCOUNTER — Ambulatory Visit: Payer: Medicare Other | Attending: Family Medicine | Admitting: Family Medicine

## 2019-03-11 DIAGNOSIS — I10 Essential (primary) hypertension: Secondary | ICD-10-CM | POA: Diagnosis not present

## 2019-03-11 MED ORDER — POTASSIUM CHLORIDE CRYS ER 10 MEQ PO TBCR: 10.0000 meq | EXTENDED_RELEASE_TABLET | Freq: Every day | ORAL | 6 refills | Status: DC

## 2019-03-11 MED ORDER — HYDROCHLOROTHIAZIDE 25 MG PO TABS: 25.0000 mg | ORAL_TABLET | Freq: Every day | ORAL | 1 refills | Status: DC

## 2019-03-11 NOTE — Progress Notes (Signed)
Virtual Visit via Telephone Note  I connected with Stephanie [name], on 03/11/2019 at 11:37 AM by telephone due to the COVID-19 pandemic and verified that I am speaking with the correct person using two identifiers.   Consent: I discussed the limitations, risks, security and privacy concerns of performing an evaluation and management service by telephone and the availability of in person appointments. I also discussed with the patient that there may be a patient responsible charge related to this service. The patient expressed understanding and agreed to proceed.   Location of Patient: Home  Location of Provider: Clinic   Persons participating in Telemedicine visit: Tully Burgo [place]-CMA Dr. Felecia Shelling     History of Present Illness: Stephanie [name] is a 73 year old female with a history of hypertension, generalized anxiety disorder, glaucoma, endometrial carcinoma (stage Ia grade 2 diagnosed in 2014 status post TAH/BSO pelvic lymphadenectomy on 07/2013) seen for an office visit today. She complains of pedal edema ever since her lisinopril/HCTZ was discontinued at her last office visit and substituted with lisinopril and her amlodipine dose was also increased at her last visit. She was taken off the former due to hypokalemia.  She noticed Lipitor 'has been acting like a diuretic' and she usually urinates every hour for about 4 hours at night after taking Lipitor but then is concerned because her feet remain elevated during the day until nighttime. I had received her telephone message and had discontinued amlodipine and commenced hydrochlorothiazide and potassium however she never received this message. She also has questions about Lipitor-wondering if this causes cancer and this has been refuted.   Past Medical History:  Diagnosis Date  . Anemia   . Anxiety   . Blindness, legal   . Cataract   . Complication of anesthesia   . Difficulty sleeping   . Endometrial  cancer ([facility])   . Fibroids   . Glaucoma   . History of transfusion   . Hypertension   . PNA (pneumonia)   . PONV (postoperative nausea and vomiting)   . Vaginal bleeding    Allergies  Allergen Reactions  . Iron Anaphylaxis    IV Iron  . Iron Dextran Anaphylaxis  . Penicillins Hives and Swelling    PT, STATED ALLERGIES TO ALL " CILLINS" Tongue swelling  . Amoxicillin Other (See Comments)    Unknown reaction  . Ampicillin Other (See Comments)    Unknown reaction  . Codeine Nausea And Vomiting  . Diamox [Acetazolamide] Nausea And Vomiting, Diarrhea and Nausea Only    Other reaction(s): Cramps (ALLERGY/intolerance) Severe nausea and vomitting  . Penicillin G Swelling and Hives  . Other Nausea Only    Current Outpatient Medications on File Prior to Visit  Medication Sig Dispense Refill  . atorvastatin (LIPITOR) 20 MG tablet Take 1 tablet (20 mg total) by mouth daily. 30 tablet 3  . brimonidine (ALPHAGAN P) 0.1 % SOLN Apply to eye.    . hydrOXYzine (ATARAX/VISTARIL) 25 MG tablet Take 1 tablet (25 mg total) by mouth every 8 (eight) hours as needed for anxiety. 30 tablet 1  . latanoprost (XALATAN) 0.005 % ophthalmic solution Place 1 drop into the left eye at bedtime.     Marland Kitchen lisinopril (ZESTRIL) 40 MG tablet Take 1 tablet (40 mg total) by mouth daily. 90 tablet 1  . metoprolol tartrate (LOPRESSOR) 25 MG tablet Take 1 tablet (25 mg total) by mouth 2 (two) times daily. 180 tablet 1  . Netarsudil Dimesylate 0.02 % SOLN  Apply to eye.    . potassium chloride (K-DUR) 10 MEQ tablet Take 1 tablet (10 mEq total) by mouth daily. 30 tablet 6  . Potassium Chloride ER 20 MEQ TBCR Take 20 mEq by mouth daily. 30 tablet 3  . prednisoLONE acetate (PRED FORTE) 1 % ophthalmic suspension Place 1 drop into the right eye 4 (four) times daily.     . hydrochlorothiazide (HYDRODIURIL) 25 MG tablet Take 1 tablet (25 mg total) by mouth daily. Discontinue amlodipine (Patient not taking: Reported on 03/11/2019) 90  tablet 1   No current facility-administered medications on file prior to visit.     Observations/Objective: Awake, alert, oriented x3 Not in acute distress  Assessment and Plan: Hypertension: She complains of pedal edema which could be from amlodipine and also due to the fact that hydrochlorothiazide had been discontinued.  I have discontinued amlodipine and restarted hydrochlorothiazide which she will take in addition to potassium due to history of hypokalemia If blood pressure remains elevated we might have to add back low-dose of amlodipine Advised to keep blood pressure log and call the Clinic with her readings. Counseled on Diabetic diet, my plate method, 993 minutes of moderate intensity exercise/week Keep blood sugar logs with fasting goals of 80-120 mg/dl, random of less than 180 and in the event of sugars less than 60 mg/dl or greater than 400 mg/dl please notify the clinic ASAP. It is recommended that you undergo annual eye exams and annual foot exams. Pneumonia vaccine is recommended.   Follow Up Instructions: 1 month   I discussed the assessment and treatment plan with the patient. The patient was provided an opportunity to ask questions and all were answered. The patient agreed with the plan and demonstrated an understanding of the instructions.   The patient was advised to call back or seek an in-person evaluation if the symptoms worsen or if the condition fails to improve as anticipated.     I provided 11 minutes total of non-face-to-face time during this encounter including median intraservice time, reviewing previous notes, labs, imaging, medications, management and patient verbalized understanding.     Charlott Rakes, MD, FAAFP. [facility] [place], [place]   03/11/2019, 11:37 AM

## 2019-03-11 NOTE — Telephone Encounter (Signed)
I have discontinued amlodipine and restarted hydrochlorothiazide since she complains of pedal edema however she will need to take potassium tablets as well due to tendency for hypokalemia.

## 2019-03-11 NOTE — Progress Notes (Signed)
Patient has been called and DOB has been verified. Patient has been screened and transferred to PCP to start phone visit.     

## 2019-03-11 NOTE — Addendum Note (Signed)
Addended by: Charlott Rakes on: 03/11/2019 08:44 AM   Modules accepted: Orders

## 2019-03-11 NOTE — Telephone Encounter (Signed)
Patient was given televisit with PCP for 03/11/2019

## 2019-06-18 DIAGNOSIS — H548 Legal blindness, as defined in USA: Secondary | ICD-10-CM | POA: Insufficient documentation

## 2019-06-18 DIAGNOSIS — Z8542 Personal history of malignant neoplasm of other parts of uterus: Secondary | ICD-10-CM | POA: Insufficient documentation

## 2019-08-12 ENCOUNTER — Other Ambulatory Visit: Payer: Self-pay | Admitting: Family Medicine

## 2019-09-07 ENCOUNTER — Other Ambulatory Visit: Payer: Self-pay | Admitting: Family Medicine

## 2019-09-14 ENCOUNTER — Other Ambulatory Visit: Payer: Self-pay | Admitting: Family Medicine

## 2019-09-14 DIAGNOSIS — N23 Unspecified renal colic: Secondary | ICD-10-CM

## 2019-09-22 ENCOUNTER — Ambulatory Visit
Admission: RE | Admit: 2019-09-22 | Discharge: 2019-09-22 | Disposition: A | Discharge status: Home / Self Care | Payer: Medicare Other | Source: Ambulatory Visit | Attending: Family Medicine | Admitting: Family Medicine

## 2019-09-22 ENCOUNTER — Other Ambulatory Visit: Payer: Self-pay

## 2019-09-22 DIAGNOSIS — N23 Unspecified renal colic: Secondary | ICD-10-CM

## 2019-09-22 MED ORDER — IOPAMIDOL (ISOVUE-300) INJECTION 61%
100.0000 mL | Freq: Once | INTRAVENOUS | Status: AC | PRN
  Administered 2019-09-22: 14:00:00 100 mL via INTRAVENOUS

## 2019-09-28 ENCOUNTER — Telehealth: Payer: Self-pay | Admitting: *Deleted

## 2019-09-28 NOTE — Telephone Encounter (Signed)
Scheduled to see Dr Berline Lopes tomorrow

## 2019-09-28 NOTE — Progress Notes (Signed)
Gynecologic Oncology Return Clinic Visit  09/29/19  Reason for Visit: History of IA gr 2 endometrial cancer diagnosed in 2014 now with abdominal pain and para-spinous mass  Treatment History: Oncology History  Endometrial cancer ([facility])  06/01/2013 Initial Diagnosis   Endometrial cancer   06/01/2013 Initial Biopsy   EMB for AUB   07/21/2013 Surgery   TAH/BSO, bilateral pelvic LND with Dr. Wayland Salinas   07/21/2013 Pathologic Stage   Stage IA, Gr 2, <50% MI, no LVSI, LNs negative   09/22/2019 Imaging   CT A/P: There is a left paraspinous mass which is poorly defined and low-density measuring 3 x 2.2 by 5.5 cm. Deviates the left ureter laterally. No additional periaortic masses are identified.     Interval History: Patient was last seen in our clinic in 05/2017. Was NED at that time.   She presents today after being referred by her primary care provider due to findings of a para-aortic and paraspinous ill-defined mass.  The patient notes that she had been told to watch her kidneys carefully due to her hypertension and antihypertensive medications.  In September, she remembers having kidney pain and at that point her PCP had her stop hydrochlorothiazide.  Secondary to edema that developed subsequently, she was told to restart it.  She then switched primary care providers and was started on Lasix.  She had worsening kidney pain afterward and then developed malodorous urine.  When she developed suprapubic pain, she thought that she might have a urinary tract infection.  She underwent laboratory testing and was found to have normal kidney function and no urinary tract infection.  At that point she was taking extra strength Tylenol as needed for the kidney pain which would help significantly.  Over the last 3-1/2 weeks, she notes worsening of the pain which she describes in her left lower back and radiates around to the front on that side.  She now takes extra strength Tylenol every 6 hours which helps  alleviate the pain, but if she goes more than 6 hours and does not take another dose, she starts having worsening back pain.    He also reports a longstanding history of constipation, for which she normally uses foods to help with symptoms.  Recently, she has started to use laxatives.  Yesterday she had a significant amount of output, and this morning had a bowel movement on her own without the help of medication.  She denies any vaginal bleeding or discharge.  She endorses a good appetite without nausea or emesis.  She presents with her son today.  Past Medical/Surgical History: Past Medical History:  Diagnosis Date  . Anemia   . Anxiety   . Blindness, legal   . Cataract   . Complication of anesthesia   . Difficulty sleeping   . Endometrial cancer ([facility])   . Fibroids   . Glaucoma   . History of transfusion   . Hypertension   . PNA (pneumonia)   . PONV (postoperative nausea and vomiting)   . Vaginal bleeding     Past Surgical History:  Procedure Laterality Date  . ABDOMINAL HYSTERECTOMY N/A 07/21/2013   Procedure: TOTAL HYSTERECTOMY ABDOMINAL ;  Surgeon: Alvino Chapel, MD;  Location: [facility];  Service: Gynecology;  Laterality: N/A;  . DILATION AND CURETTAGE OF UTERUS    . LAPAROTOMY N/A 07/21/2013   Procedure: EXPLORATORY LAPAROTOMY/ PELVIC LYMPHADENECTOMY   ;  Surgeon: Alvino Chapel, MD;  Location: [facility];  Service: Gynecology;  Laterality: N/A;  .  SALPINGOOPHORECTOMY Bilateral 07/21/2013   Procedure: BILATERAL SALPINGO OOPHORECTOMY;  Surgeon: Alvino Chapel, MD;  Location: [facility];  Service: Gynecology;  Laterality: Bilateral;    Family History  Problem Relation Age of Onset  . Hypertension Father   . Vision loss Maternal Grandmother     Social History   Socioeconomic History  . Marital status: Divorced    Spouse name: Not on file  . Number of children: Not on file  . Years of education: Not on file  . Highest education level: Not on file   Occupational History  . Not on file  Tobacco Use  . Smoking status: Never Smoker  . Smokeless tobacco: Never Used  Substance and Sexual Activity  . Alcohol use: No  . Drug use: No  . Sexual activity: Not Currently    Birth control/protection: None  Other Topics Concern  . Not on file  Social History Narrative  . Not on file   Social Determinants of Health   Financial Resource Strain:   . Difficulty of Paying Living Expenses: Not on file  Food Insecurity:   . Worried About Charity fundraiser in the Last Year: Not on file  . Ran Out of Food in the Last Year: Not on file  Transportation Needs:   . Lack of Transportation (Medical): Not on file  . Lack of Transportation (Non-Medical): Not on file  Physical Activity:   . Days of Exercise per Week: Not on file  . Minutes of Exercise per Session: Not on file  Stress:   . Feeling of Stress : Not on file  Social Connections:   . Frequency of Communication with Friends and Family: Not on file  . Frequency of Social Gatherings with Friends and Family: Not on file  . Attends Religious Services: Not on file  . Active Member of Clubs or Organizations: Not on file  . Attends Archivist Meetings: Not on file  . Marital Status: Not on file    Current Medications:  Current Outpatient Medications:  .  amLODipine (NORVASC) 10 MG tablet, Take by mouth., Disp: , Rfl:  .  atorvastatin (LIPITOR) 20 MG tablet, Take 1 tablet (20 mg total) by mouth daily., Disp: 30 tablet, Rfl: 3 .  brimonidine (ALPHAGAN) 0.2 % ophthalmic solution, , Disp: , Rfl:  .  busPIRone (BUSPAR) 7.5 MG tablet, Take 7.5 mg by mouth 2 (two) times daily., Disp: , Rfl:  .  dorzolamide-timolol (COSOPT) 22.3-6.8 MG/ML ophthalmic solution, Place 1 drop into the left eye 2 (two) times daily., Disp: , Rfl:  .  lisinopril (ZESTRIL) 40 MG tablet, Take 1 tablet by mouth once daily, Disp: 90 tablet, Rfl: 0 .  metoprolol tartrate (LOPRESSOR) 25 MG tablet, Take 1 tablet (25  mg total) by mouth 2 (two) times daily., Disp: 180 tablet, Rfl: 1 .  hydrochlorothiazide (HYDRODIURIL) 25 MG tablet, Take 1 tablet (25 mg total) by mouth daily. Discontinue amlodipine (Patient not taking: Reported on 03/11/2019), Disp: 90 tablet, Rfl: 1 .  hydrOXYzine (ATARAX/VISTARIL) 25 MG tablet, Take 1 tablet (25 mg total) by mouth every 8 (eight) hours as needed for anxiety. (Patient not taking: Reported on 09/29/2019), Disp: 30 tablet, Rfl: 1 .  Netarsudil Dimesylate 0.02 % SOLN, Apply to eye., Disp: , Rfl:  .  VYZULTA 0.024 % SOLN, , Disp: , Rfl:   Review of Symptoms: Pertinent positives as per HPI.  Negatives reviewed with the patient as noted below: Denies appetite changes, fevers, chills, fatigue, unexplained weight  changes. Denies hearing loss, neck lumps or masses, mouth sores, ringing in ears or voice changes. Denies cough or wheezing.  Denies shortness of breath. Denies chest pain or palpitations. Denies leg swelling. Denies abdominal distention, pain, blood in stools, diarrhea, nausea, vomiting, or early satiety. Denies pain with intercourse, dysuria, frequency, hematuria or incontinence. Denies hot flashes, pelvic pain, vaginal bleeding or vaginal discharge.   Denies joint pain or muscle pain/cramps. Denies itching, rash, or wounds. Denies dizziness, headaches, numbness or seizures. Denies swollen lymph nodes or glands, denies easy bruising or bleeding. Denies anxiety, confusion, or decreased concentration.   Physical Exam: BP (!) 173/106 (BP Location: Left Arm, Patient Position: Sitting) Comment: Dr. Berline Lopes Notified  Pulse (!) 108   Temp 98 F (36.7 C) (Temporal)   Resp 18   Wt 153 lb (69.4 kg)   SpO2 100%   BMI 24.69 kg/m  General: Alert, oriented, no acute distress. HEENT: Atraumatic, sclera anicteric. Chest: Clear to auscultation bilaterally.  No wheezing rhonchi or rales. Cardiovascular: Heart rate 90s, regular rhythm, no murmurs. Abdomen: soft, nontender.   Normoactive bowel sounds.  No masses or hepatosplenomegaly appreciated.  Well-healed scar. Back: Some tenderness with palpation in the left lower back, not true CVA tenderness, just lateral to the spine. Extremities: Grossly normal range of motion.  Warm, well perfused.  Trace edema bilaterally. Skin: No rashes or lesions noted. Lymphatics: No cervical, supraclavicular, or inguinal adenopathy. GU: Normal appearing external genitalia without erythema, excoriation, or lesions.  Speculum exam reveals moderately atrophic vaginal mucosa.  Cuff intact without lesions.  Bimanual exam reveals cuff intact, no masses or nodularity.  Rectovaginal exam confirms these findings.  Laboratory & Radiologic Studies: CT A/P on 09/22/19: Retroperitoneal mass periaortic on the left. Tumor versus infection. Concerning for metastatic disease from known endometrial carcinoma. Dilated common bile duct and pancreatic duct without identifiedetiology. Low-density lesions of the liver most likely cysts. Recommend gallbladder ultrasound.  Assessment & Plan: JADELINE OPALKA is a 73 y.o. woman with a history of stage Ia low risk endometrial cancer treated with definitive surgery just over 6 years ago now presenting with a paraspinous mass concerning for possible disease recurrence.  Reviewed CT report with the patient and her son.  Although she is more than 6 years out from her original surgery, findings are suspicious for disease recurrence.  I spoke with one of the interventional radiologist this morning, and the mass is amenable to percutaneous biopsy.  He feels that it is most likely consistent with multiple necrotic lymph nodes but that the upper portion could be accessed.  We reviewed other possible etiologies of the mass including secondary malignancy, infection, or other benign process.   Pending biopsy and pathology results, I will call the patient to discuss next steps in treatment.  We reviewed that she may need a  PET scan to evaluate for any other evidence of metastatic disease (none seen on CT scan).  Given the location, I do not think that she is a great candidate for surgical excision, but would plan to review her case at tumor board.  Suspect that she would more likely be a candidate for localized radiation and/or systemic chemotherapy.  Significantly hypertensive today, asymptomatic.  Encouraged her to continue antihypertensive medications and follow-up with her PCP.  She takes her blood pressure at home and reports that SBP values are 120-130.  More than 50% of this 25-minute visit was spent in direct face-to-face counseling and coordination of care for this patient.  Jeral Pinch,  MD  Division of Gynecologic Oncology  Department of Obstetrics and Gynecology  [facility]

## 2019-09-29 ENCOUNTER — Encounter: Payer: Self-pay | Admitting: Gynecologic Oncology

## 2019-09-29 ENCOUNTER — Inpatient Hospital Stay (HOSPITAL_BASED_OUTPATIENT_CLINIC_OR_DEPARTMENT_OTHER): Payer: Medicare Other | Admitting: Gynecologic Oncology

## 2019-09-29 ENCOUNTER — Inpatient Hospital Stay: Payer: Medicare Other | Attending: Gynecologic Oncology

## 2019-09-29 ENCOUNTER — Other Ambulatory Visit: Payer: Self-pay

## 2019-09-29 VITALS — BP 173/106 | HR 108 | Temp 98.0°F | Resp 18 | Wt 153.0 lb

## 2019-09-29 DIAGNOSIS — Z79899 Other long term (current) drug therapy: Secondary | ICD-10-CM | POA: Insufficient documentation

## 2019-09-29 DIAGNOSIS — Z90722 Acquired absence of ovaries, bilateral: Secondary | ICD-10-CM | POA: Diagnosis not present

## 2019-09-29 DIAGNOSIS — C541 Malignant neoplasm of endometrium: Secondary | ICD-10-CM

## 2019-09-29 DIAGNOSIS — I10 Essential (primary) hypertension: Secondary | ICD-10-CM | POA: Diagnosis not present

## 2019-09-29 DIAGNOSIS — D487 Neoplasm of uncertain behavior of other specified sites: Secondary | ICD-10-CM

## 2019-09-29 DIAGNOSIS — M545 Low back pain: Secondary | ICD-10-CM | POA: Diagnosis not present

## 2019-09-29 DIAGNOSIS — Z8542 Personal history of malignant neoplasm of other parts of uterus: Secondary | ICD-10-CM | POA: Insufficient documentation

## 2019-09-29 DIAGNOSIS — K5900 Constipation, unspecified: Secondary | ICD-10-CM | POA: Diagnosis not present

## 2019-09-29 DIAGNOSIS — Z90710 Acquired absence of both cervix and uterus: Secondary | ICD-10-CM | POA: Insufficient documentation

## 2019-09-29 DIAGNOSIS — Z08 Encounter for follow-up examination after completed treatment for malignant neoplasm: Secondary | ICD-10-CM

## 2019-09-29 DIAGNOSIS — F419 Anxiety disorder, unspecified: Secondary | ICD-10-CM | POA: Insufficient documentation

## 2019-09-29 LAB — BASIC METABOLIC PANEL - CANCER CENTER ONLY
Anion gap: 11 (ref 5–15)
BUN: 7 mg/dL — ABNORMAL LOW (ref 8–23)
CO2: 26 mmol/L (ref 22–32)
Calcium: 9.5 mg/dL (ref 8.9–10.3)
Chloride: 103 mmol/L (ref 98–111)
Creatinine: 0.84 mg/dL (ref 0.44–1.00)
GFR, Est AFR Am: >60 mL/min (ref >60)
GFR, Estimated: >60 mL/min (ref >60)
Glucose, Bld: 148 mg/dL — ABNORMAL HIGH (ref 70–99)
Potassium: 3.1 mmol/L — ABNORMAL LOW (ref 3.5–5.1)
Sodium: 140 mmol/L (ref 135–145)

## 2019-09-29 NOTE — Patient Instructions (Addendum)
It was a pleasure meeting you today.  Given the mass near your aorta and kidney, I recommend that we proceed with trying to biopsy the mass to determine if this is your uterine cancer that has come back or some other disease process.  I will call you once the pathology from the biopsy has returned.  If the biopsy does not yield the results that we need or it appears that this may be malignancy other than your uterine cancer, we will likely proceed with a PET scan.  You develop any new or worsening symptoms before I speak with you, please call the clinic at [phone].  I will call you with the results of your lab work from today.  YOU WILL RECEIVE A PHONE CALL FROM THE HOSPITAL TO ARRANGE FOR THE BIOPSY.

## 2019-09-30 ENCOUNTER — Encounter (HOSPITAL_COMMUNITY): Payer: Self-pay

## 2019-09-30 NOTE — Progress Notes (Signed)
Stephanie [name]. [place] Female, 73 y.o., 05-12-1946 MRN:  [id number] Phone:  [phone] (H) PCP:  Charlott Rakes, MD Coverage:  [facility] ([facility]) 10/13/2019 at 11:00 AM  RE: Biopsy Received: Yesterday Message Contents  Stephanie Mariscal, MD  Stephanie [name] - discussed patient with Dr. Kathlene Cote who recommends biopsy of the superior aspect of the mass.   CT guided L paraspinal soft tissue mass Bx (image 47, series 2).   Stephanie [name]   Previous Messages  ----- Message -----  From: Stephanie [name]  Sent: 09/29/2019  3:56 PM EST  To: [facility] Procedure Requests  Subject: Biopsy                      Procedure Requested:    Reason for Procedure: History of low risk endometrial cancer treated in 2014, now with necrotic paraspinous 5 cm mass; discussed patient with Dr. Kathlene Cote who recommends biopsy of the superior aspect of the mass    Provider Requesting:  Stephanie [name]  Provider Telephone:   [phone]    Other Info: Rad exams in Epic

## 2019-10-05 DIAGNOSIS — R002 Palpitations: Secondary | ICD-10-CM | POA: Insufficient documentation

## 2019-10-07 DIAGNOSIS — R7301 Impaired fasting glucose: Secondary | ICD-10-CM | POA: Insufficient documentation

## 2019-10-12 ENCOUNTER — Other Ambulatory Visit: Payer: Self-pay | Admitting: Radiology

## 2019-10-12 ENCOUNTER — Telehealth: Payer: Self-pay

## 2019-10-12 NOTE — Telephone Encounter (Signed)
Ms Stephanie [name] said that her PCP has put her on Doxycycline for UTI and Magnesium OTC due to magnesium level low at visit last week. Told her it was fine to take these meds to tomorrow am with her other morning meds before the procedure. Pt verbalized understanding.

## 2019-10-13 ENCOUNTER — Ambulatory Visit (HOSPITAL_COMMUNITY)
Admission: RE | Admit: 2019-10-13 | Discharge: 2019-10-13 | Disposition: A | Discharge status: Home / Self Care | Payer: Medicare Other | Source: Ambulatory Visit | Attending: Gynecologic Oncology | Admitting: Gynecologic Oncology

## 2019-10-13 ENCOUNTER — Other Ambulatory Visit: Payer: Self-pay

## 2019-10-13 DIAGNOSIS — C499 Malignant neoplasm of connective and soft tissue, unspecified: Secondary | ICD-10-CM | POA: Diagnosis not present

## 2019-10-13 DIAGNOSIS — Z8542 Personal history of malignant neoplasm of other parts of uterus: Secondary | ICD-10-CM | POA: Diagnosis not present

## 2019-10-13 DIAGNOSIS — C541 Malignant neoplasm of endometrium: Secondary | ICD-10-CM | POA: Diagnosis present

## 2019-10-13 LAB — CBC
HCT: 41.3 % (ref 36.0–46.0)
Hemoglobin: 14.5 g/dL (ref 12.0–15.0)
MCH: 31.5 pg (ref 26.0–34.0)
MCHC: 35.1 g/dL (ref 30.0–36.0)
MCV: 89.6 fL (ref 80.0–100.0)
Platelets: 235 10*3/uL (ref 150–400)
RBC: 4.61 MIL/uL (ref 3.87–5.11)
RDW: 13.7 % (ref 11.5–15.5)
WBC: 6.8 10*3/uL (ref 4.0–10.5)
nRBC: 0 % (ref 0.0–0.2)

## 2019-10-13 LAB — APTT: aPTT: 31 seconds (ref 24–36)

## 2019-10-13 LAB — PROTIME-INR
INR: 1 (ref 0.8–1.2)
Prothrombin Time: 12.7 seconds (ref 11.4–15.2)

## 2019-10-13 MED ORDER — SODIUM CHLORIDE 0.9 % IV SOLN
INTRAVENOUS | Status: AC | PRN
  Administered 2019-10-13: 10 mL/h via INTRAVENOUS

## 2019-10-13 MED ORDER — SODIUM CHLORIDE 0.9 % IV SOLN: INTRAVENOUS | Status: DC

## 2019-10-13 MED ORDER — FENTANYL CITRATE (PF) 100 MCG/2ML IJ SOLN
INTRAMUSCULAR | Status: AC | PRN
  Administered 2019-10-13 (×2): 25 ug via INTRAVENOUS

## 2019-10-13 MED ORDER — MIDAZOLAM HCL 2 MG/2ML IJ SOLN
INTRAMUSCULAR | Status: AC
  Filled 2019-10-13: qty 2

## 2019-10-13 MED ORDER — MIDAZOLAM HCL 2 MG/2ML IJ SOLN
INTRAMUSCULAR | Status: AC | PRN
  Administered 2019-10-13 (×2): 0.5 mg via INTRAVENOUS

## 2019-10-13 MED ORDER — FENTANYL CITRATE (PF) 100 MCG/2ML IJ SOLN
INTRAMUSCULAR | Status: AC
  Filled 2019-10-13: qty 2

## 2019-10-13 NOTE — Procedures (Signed)
Interventional Radiology Procedure Note  Procedure: CT guided left paraspinal mass biopsy.    Complications: None Recommendations:  - Ok to shower tomorrow - Do not submerge for 7 days - Routine care - 1 hour recovery   Signed,  Dulcy Fanny. Earleen Newport, DO

## 2019-10-13 NOTE — Discharge Instructions (Addendum)
Needle Biopsy, Care After °These instructions tell you how to care for yourself after your procedure. Your doctor may also give you more specific instructions. Call your doctor if you have any problems or questions. °What can I expect after the procedure? °After the procedure, it is common to have: °· Soreness. °· Bruising. °· Mild pain. °Follow these instructions at home: ° °· Return to your normal activities as told by your doctor. Ask your doctor what activities are safe for you. °· Take over-the-counter and prescription medicines only as told by your doctor. °· Wash your hands with soap and water before you change your bandage (dressing). If you cannot use soap and water, use hand sanitizer. °· Follow instructions from your doctor about: °? How to take care of your puncture site. °? When and how to change your bandage. °? When to remove your bandage. °· Check your puncture site every day for signs of infection. Watch for: °? Redness, swelling, or pain. °? Fluid or blood.  °? Pus or a bad smell. °? Warmth. °· Do not take baths, swim, or use a hot tub until your doctor approves. Ask your doctor if you may take showers. You may only be allowed to take sponge baths. °· Keep all follow-up visits as told by your doctor. This is important. °Contact a doctor if you have: °· A fever. °· Redness, swelling, or pain at the puncture site, and it lasts longer than a few days. °· Fluid, blood, or pus coming from the puncture site. °· Warmth coming from the puncture site. °Get help right away if: °· You have a lot of bleeding from the puncture site. °Summary °· After the procedure, it is common to have soreness, bruising, or mild pain at the puncture site. °· Check your puncture site every day for signs of infection, such as redness, swelling, or pain. °· Get help right away if you have severe bleeding from your puncture site. °This information is not intended to replace advice given to you by your health care provider. Make  sure you discuss any questions you have with your health care provider. °Document Released: 09/13/2008 Document Revised: 10/14/2017 Document Reviewed: 10/14/2017 °Elsevier Patient Education © 2020 [employer] °Moderate Conscious Sedation, Adult, Care After °These instructions provide you with information about caring for yourself after your procedure. Your health care provider may also give you more specific instructions. Your treatment has been planned according to current medical practices, but problems sometimes occur. Call your health care provider if you have any problems or questions after your procedure. °What can I expect after the procedure? °After your procedure, it is common: °· To feel sleepy for several hours. °· To feel clumsy and have poor balance for several hours. °· To have poor judgment for several hours. °· To vomit if you eat too soon. °Follow these instructions at home: °For at least 24 hours after the procedure: ° °· Do not: °? Participate in activities where you could fall or become injured. °? Drive. °? Use heavy machinery. °? Drink alcohol. °? Take sleeping pills or medicines that cause drowsiness. °? Make important decisions or sign legal documents. °? Take care of children on your own. °· Rest. °Eating and drinking °· Follow the diet recommended by your health care provider. °· If you vomit: °? Drink water, juice, or soup when you can drink without vomiting. °? Make sure you have little or no nausea before eating solid foods. °General instructions °· Have a responsible adult stay with   you until you are awake and alert. °· Take over-the-counter and prescription medicines only as told by your health care provider. °· If you smoke, do not smoke without supervision. °· Keep all follow-up visits as told by your health care provider. This is important. °Contact a health care provider if: °· You keep feeling nauseous or you keep vomiting. °· You feel light-headed. °· You develop a rash. °· You  have a fever. °Get help right away if: °· You have trouble breathing. °This information is not intended to replace advice given to you by your health care provider. Make sure you discuss any questions you have with your health care provider. °Document Released: 07/22/2013 Document Revised: 09/13/2017 Document Reviewed: 01/21/2016 °Elsevier Patient Education © 2020 [employer]. ° °

## 2019-10-13 NOTE — H&P (Signed)
Chief Complaint: Patient was seen in consultation today for left paraspinal mass biopsy.  Referring Physician(s): Tucker,Katherine R  Supervising Physician: Sandi Mariscal  Patient Status: [facility] - Out-pt  History of Present Illness: Stephanie [name] is a 73 y.o. female with a past medical history significant for anxiety, legal blindness, cataracts, glaucoma, anemia, HTN and endometrial cancer s/p TAH/BSO 2014 who presents today for a left paraspinal mass biopsy. Stephanie [name] was seen by her PCP in September of this year and reported kidney pain, she had several medication changes as well as lab/UA which did not relieve her pain.  She underwent a CT abd/pelvis with contrast on 12/8 which noted a left retroperitoneal mass concerning for metastatic disease given her history of endometrial carcinoma. She was referred to gynecologic oncology and was seen by Dr. Berline Lopes on 12/15 who has consulted IR for a biopsy of the paraspinal mass to further guide care.  Stephanie [name] reports several complaints today including fever and chills at home several weeks ago with no other associated symptoms that she is aware of, she checked her temperature with highest reading of 99.0 and reports that she would have chills intermittently as she often gets cold in the winter. She reports nausea without vomiting this morning after taking her medication as she was unable to eat anything. She reports that she has "kidney pain where that spot is" that started several months ago and is relieved completely with Tylenol, although it wears off after exactly 6 hours. She reports the back pain is worse when she lays on her back unless she elevates her head and feet. She also reports that she was seen by her "doctor at the cancer center" who ordered lab work and collected her urine last week and states she was told "my kidneys and blood are fine but she said there was problem with my urine and gave me doxycycline." She denies any urinary  symptoms today. She reports history of white coat syndrome and is very nervous about going into the CT machine as she state she was "terrified the whole time when I have the one before." She states understanding of the requested procedure and wishes to proceed.   Past Medical History:  Diagnosis Date  . Anemia   . Anxiety   . Blindness, legal   . Cataract   . Complication of anesthesia   . Difficulty sleeping   . Endometrial cancer ([place])   . Fibroids   . Glaucoma   . History of transfusion   . Hypertension   . PNA (pneumonia)   . PONV (postoperative nausea and vomiting)   . Vaginal bleeding     Past Surgical History:  Procedure Laterality Date  . ABDOMINAL HYSTERECTOMY N/A 07/21/2013   Procedure: TOTAL HYSTERECTOMY ABDOMINAL ;  Surgeon: Alvino Chapel, MD;  Location: [facility];  Service: Gynecology;  Laterality: N/A;  . DILATION AND CURETTAGE OF UTERUS    . LAPAROTOMY N/A 07/21/2013   Procedure: EXPLORATORY LAPAROTOMY/ PELVIC LYMPHADENECTOMY   ;  Surgeon: Alvino Chapel, MD;  Location: [facility];  Service: Gynecology;  Laterality: N/A;  . SALPINGOOPHORECTOMY Bilateral 07/21/2013   Procedure: BILATERAL SALPINGO OOPHORECTOMY;  Surgeon: Alvino Chapel, MD;  Location: [facility];  Service: Gynecology;  Laterality: Bilateral;    Allergies: Iron, Iron dextran, Penicillins, Amoxicillin, Ampicillin, Codeine, Diamox [acetazolamide], Penicillin g, and Other  Medications: Prior to Admission medications   Medication Sig Start Date End Date Taking? Authorizing Provider  amLODipine (NORVASC) 10 MG tablet Take  by mouth.   Yes [provider]  atorvastatin (LIPITOR) 20 MG tablet Take 1 tablet (20 mg total) by mouth daily. 03/04/19  Yes Charlott Rakes, MD  brimonidine (ALPHAGAN) 0.2 % ophthalmic solution  09/29/19  Yes [provider]  busPIRone (BUSPAR) 7.5 MG tablet Take 7.5 mg by mouth 2 (two) times daily. 09/17/19  Yes [provider]    dorzolamide-timolol (COSOPT) 22.3-6.8 MG/ML ophthalmic solution Place 1 drop into the left eye 2 (two) times daily. 08/30/19  Yes [provider]  doxycycline 100 mg in sodium chloride 0.9 % 250 mL Inject 100 mg into the vein every 12 (twelve) hours. 10/07/19  Yes [provider]  lisinopril (ZESTRIL) 40 MG tablet Take 1 tablet by mouth once daily 08/12/19  Yes Newlin, Enobong, MD  magnesium 30 MG tablet Take by mouth daily.   Yes [provider]  metoprolol tartrate (LOPRESSOR) 25 MG tablet Take 1 tablet (25 mg total) by mouth 2 (two) times daily. 02/17/19  Yes Charlott Rakes, MD  hydrochlorothiazide (HYDRODIURIL) 25 MG tablet Take 1 tablet (25 mg total) by mouth daily. Discontinue amlodipine Patient not taking: Reported on 03/11/2019 03/11/19   Charlott Rakes, MD  hydrOXYzine (ATARAX/VISTARIL) 25 MG tablet Take 1 tablet (25 mg total) by mouth every 8 (eight) hours as needed for anxiety. Patient not taking: Reported on 09/29/2019 02/17/19   Charlott Rakes, MD  Netarsudil Dimesylate 0.02 % SOLN Apply to eye. 04/11/17   [provider]  VYZULTA 0.024 % SOLN  08/14/19   [provider]     Family History  Problem Relation Age of Onset  . Hypertension Father   . Vision loss Maternal Grandmother     Social History   Socioeconomic History  . Marital status: Divorced    Spouse name: Not on file  . Number of children: Not on file  . Years of education: Not on file  . Highest education level: Not on file  Occupational History  . Not on file  Tobacco Use  . Smoking status: Never Smoker  . Smokeless tobacco: Never Used  Substance and Sexual Activity  . Alcohol use: No  . Drug use: No  . Sexual activity: Not Currently    Birth control/protection: None  Other Topics Concern  . Not on file  Social History Narrative  . Not on file   Social Determinants of Health   Financial Resource Strain:   . Difficulty of Paying Living Expenses: Not on file   Food Insecurity:   . Worried About Charity fundraiser in the Last Year: Not on file  . Ran Out of Food in the Last Year: Not on file  Transportation Needs:   . Lack of Transportation (Medical): Not on file  . Lack of Transportation (Non-Medical): Not on file  Physical Activity:   . Days of Exercise per Week: Not on file  . Minutes of Exercise per Session: Not on file  Stress:   . Feeling of Stress : Not on file  Social Connections:   . Frequency of Communication with Friends and Family: Not on file  . Frequency of Social Gatherings with Friends and Family: Not on file  . Attends Religious Services: Not on file  . Active Member of Clubs or Organizations: Not on file  . Attends Archivist Meetings: Not on file  . Marital Status: Not on file     Review of Systems: A 12 point ROS discussed and pertinent positives are indicated in  the HPI above.  All other systems are negative.  Review of Systems  Constitutional: Positive for chills ("several weeks ago") and fever ("several weeks ago").  Respiratory: Negative for cough and shortness of breath.   Cardiovascular: Positive for leg swelling (chronic). Negative for chest pain.  Gastrointestinal: Positive for constipation (chronic) and nausea ("this morning due to not being able to eat and taking medicines"). Negative for abdominal pain, diarrhea and vomiting.  Musculoskeletal: Positive for back pain. Negative for gait problem.  Skin: Negative for rash and wound.  Neurological: Negative for dizziness and headaches.  Psychiatric/Behavioral: The patient is nervous/anxious.     Vital Signs: BP (!) 168/107   Pulse (!) 110   Temp 97.6 F (36.4 C) (Skin)   Resp 16   Ht 5\' 6"  (1.676 m)   Wt 153 lb (69.4 kg)   SpO2 99%   BMI 24.69 kg/m   Physical Exam Vitals reviewed.  Constitutional:      General: She is not in acute distress. HENT:     Head: Normocephalic.     Mouth/Throat:     Mouth: Mucous membranes are moist.      Pharynx: Oropharynx is clear. No oropharyngeal exudate or posterior oropharyngeal erythema.  Eyes:     Comments: (+) legally blind  Cardiovascular:     Rate and Rhythm: Regular rhythm. Tachycardia present.  Pulmonary:     Effort: Pulmonary effort is normal.     Breath sounds: Normal breath sounds.  Abdominal:     General: There is no distension.     Palpations: Abdomen is soft.     Tenderness: There is no abdominal tenderness.  Musculoskeletal:     Comments: (+) back pain when sitting up, TTP  Skin:    General: Skin is warm and dry.  Neurological:     Mental Status: She is alert and oriented to person, place, and time.  Psychiatric:        Mood and Affect: Mood normal.        Thought Content: Thought content normal.        Judgment: Judgment normal.      MD Evaluation Airway: WNL Heart: WNL Abdomen: WNL Chest/ Lungs: WNL ASA  Classification: 2 Mallampati/Airway Score: One   Imaging: CT ABDOMEN PELVIS W CONTRAST  Result Date: 09/22/2019 CLINICAL DATA:  Abdominal pain and constipation EXAM: CT ABDOMEN AND PELVIS WITH CONTRAST TECHNIQUE: Multidetector CT imaging of the abdomen and pelvis was performed using the standard protocol following bolus administration of intravenous contrast. CONTRAST:  117mL ISOVUE-300 IOPAMIDOL (ISOVUE-300) INJECTION 61% COMPARISON:  None. FINDINGS: Lower chest: No acute abnormality. Hepatobiliary: 2 well-circumscribed low-density lesions in the liver. 7.5 mm and 7 mm. Likely cysts. No gallstones. Common bile duct is dilated measuring 12 mm. Pancreas: No focal masses are identified. Pancreatic duct dilated measuring 4.3 mm. Spleen: Normal in size without focal abnormality. Adrenals/Urinary Tract: Adrenal glands are unremarkable. Kidneys are normal, without renal calculi, focal lesion, or hydronephrosis. Bladder is unremarkable. Stomach/Bowel: No masses.  No inflammation.  Appendix not seen. Vascular/Lymphatic: There is a left paraspinous mass which is  poorly defined and low-density measuring 3 x 2.2 by 5.5 cm. Deviates the left ureter laterally. No additional periaortic masses are identified. Reproductive: Status post hysterectomy. No adnexal masses. Other: No abdominal wall hernia or abnormality. No abdominopelvic ascites. Musculoskeletal: No acute or significant osseous findings. IMPRESSION: Retroperitoneal mass periaortic on the left. Tumor versus infection. Concerning for metastatic disease from known endometrial carcinoma. Dilated common bile duct and  pancreatic duct without identified etiology. Low-density lesions of the liver most likely cysts. Recommend gallbladder ultrasound. These results will be called to the ordering clinician or representative by the Radiologist Assistant, and communication documented in the PACS or zVision Dashboard. Electronically Signed   By: Leticia Penna M.D.   On: 09/22/2019 13:51    Labs:  CBC: Recent Labs    10/13/19 0926  WBC 6.8  HGB 14.5  HCT 41.3  PLT 235    COAGS: Recent Labs    10/13/19 0926  INR 1.0  APTT 31    BMP: Recent Labs    03/03/19 0934 09/29/19 1240  NA 139 140  K 3.5 3.1*  CL 98 103  CO2 27 26  GLUCOSE 115* 148*  BUN 25 7*  CALCIUM 9.6 9.5  CREATININE 0.91 0.84  GFRNONAA 64 >60  GFRAA 74 >60    LIVER FUNCTION TESTS: Recent Labs    03/03/19 0934  BILITOT 0.5  AST 13  ALT 13  ALKPHOS 100  PROT 8.0  ALBUMIN 4.7    TUMOR MARKERS: No results for input(s): AFPTM, CEA, CA199, CHROMGRNA in the last 8760 hours.  Assessment and Plan:  73 y/o F with remote history of endometrial cancer (s/p TAH/BSO 2014) who was found to have a left paraspinal mass concerning for malignancy on CT after prolonged left flank/back pain. IR has been asked to biopsy this mass to further guide care.  Patient has been NPO since 10 pm except for a few sips of water with her blood pressure medication and antibiotic. She is currently taking doxycycline 100 mg BID due to what sounds  like a UTI per patient although I cannot see any documentation in Epic or Care Everywhere. Afebrile, WBC 6.8, hgb 14.5, plt 235, INR 1.0.  Risks and benefits of left paraspinal abscess biopsy was discussed with the patient and/or patient's family including, but not limited to bleeding, infection, damage to adjacent structures or low yield requiring additional tests.  All of the questions were answered and there is agreement to proceed.  Consent signed and in chart.  Thank you for this interesting consult.  I greatly enjoyed meeting Stephanie [name] and look forward to participating in their care.  A copy of this report was sent to the requesting provider on this date.  Electronically Signed: Joaquim Nam, PA-C 10/13/2019, 10:37 AM   I spent a total of 30 Minutes in face to face in clinical consultation, greater than 50% of which was counseling/coordinating care for left paraspinal abscess biopsy.

## 2019-10-15 ENCOUNTER — Telehealth: Payer: Self-pay | Admitting: Gynecologic Oncology

## 2019-10-15 ENCOUNTER — Encounter: Payer: Self-pay | Admitting: Oncology

## 2019-10-15 ENCOUNTER — Telehealth: Payer: Self-pay | Admitting: Oncology

## 2019-10-15 DIAGNOSIS — C541 Malignant neoplasm of endometrium: Secondary | ICD-10-CM

## 2019-10-15 NOTE — Progress Notes (Signed)
Stephanie [name] and reviewed PET scan instructions.  Also discussed that Radiation Oncology will be calling her with an appointment.

## 2019-10-15 NOTE — Telephone Encounter (Signed)
Stephanie [name] and scheduled appointment with Dr. Alvy Bimler on 10/22/19 at 12:15.  Asked if she would like to attend the patient education class on 01/07 and she said to make if for 01/08.  Class scheduled for 01/08 at 8 am.

## 2019-10-15 NOTE — Progress Notes (Signed)
Requested MSI on accession [id number] with [facility].

## 2019-10-15 NOTE — Telephone Encounter (Signed)
Called patient to discuss biopsy results.  No answer, left voicemail asking for return call.  Dorian Pod MD

## 2019-10-15 NOTE — Telephone Encounter (Signed)
Called the patient to review biopsy results.  Unfortunately, paraspinous mass that shows recurrent carcinoma, consistent with prior uterine cancer.  I reached out to Dr.Kinard about possible radiation treatment given localized disease.  I do not think that the mass is amenable to surgical resection.  We will plan to get a PET scan before she sees him, which is scheduled now for the 12th.  I also spoke with Dr. Alvy Bimler about systemic therapy.  She will help get the patient scheduled to see her likely in the next 1 to 2 weeks.  We also discussed addition of MSI testing.  Jeral Pinch MD Gynecologic Oncology

## 2019-10-16 DIAGNOSIS — C541 Malignant neoplasm of endometrium: Secondary | ICD-10-CM

## 2019-10-16 HISTORY — DX: Malignant neoplasm of endometrium: C54.1

## 2019-10-19 NOTE — Progress Notes (Signed)
Notified by Keisha with [facility] that there is not enough tissue to run MSI.  They are able to run MMR.  Dr. Gorsuch notified. 

## 2019-10-21 LAB — SURGICAL PATHOLOGY

## 2019-10-22 ENCOUNTER — Telehealth: Payer: Self-pay | Admitting: Oncology

## 2019-10-22 ENCOUNTER — Ambulatory Visit: Payer: Medicare Other | Admitting: Hematology and Oncology

## 2019-10-22 ENCOUNTER — Telehealth: Payer: Self-pay | Admitting: Licensed Clinical Social Worker

## 2019-10-22 DIAGNOSIS — C541 Malignant neoplasm of endometrium: Secondary | ICD-10-CM

## 2019-10-22 NOTE — Telephone Encounter (Signed)
Scheduled per 1/7 sch msg. Called and spoke with pt, confirmed 1/14 appt

## 2019-10-22 NOTE — Telephone Encounter (Signed)
Stephanie [name] called back and confirmed her appointments for 10/28/19.  Also dicussed genetic counseling and she would like to schedule a video/phone visit.  Advised her a scheduler will give her a call with the appointment.

## 2019-10-22 NOTE — Telephone Encounter (Signed)
Left a message for patient regarding rescheduled appointments.  Requested a return call to discuss.

## 2019-10-23 ENCOUNTER — Other Ambulatory Visit: Payer: Medicare Other

## 2019-10-26 ENCOUNTER — Other Ambulatory Visit: Payer: Self-pay | Admitting: Oncology

## 2019-10-26 NOTE — Progress Notes (Signed)
Radiation Oncology         [phone] ________________________________  Initial Outpatient Consultation  Name: Stephanie [name] MRN: [id number]  Date: 10/28/2019  DOB: December 01, 1945  [id number], Jimmy Picket, MD  Everitt Amber, MD   REFERRING PHYSICIAN: Everitt Amber, MD  DIAGNOSIS: The encounter diagnosis was Endometrial cancer [facility]).   Recurrent stage IA grade 2 endometrial carcinoma  HISTORY OF PRESENT ILLNESS::Stephanie [name] is a 74 y.o. female who is accompanied by son. The patient presented to her PCP with complaints of left lower back pain and constipation.  CT scan of abdomen/pelvis on 09/22/2019 showed a retroperitoneal periaortic mass on the left, concerning for metastatic disease from known endometrial carcinoma that was initially diagnosed in 2014. The patient had undergone a total abdominal hysterectomy, bilateral salpingo-oophorectomy, and pelvic lymphadenectomy on 07/21/2013. No adjuvant therapy was recommended at that time given the early stage. she tolerated the procedure well and was being followed by Dr. Fermin Schwab.  CT biopsy done on 10/13/2019 showed poorly differentiated carcinoma of left paraspinal lymph node consistent with patient's prior history of endometrial cancer.  PREVIOUS RADIATION THERAPY: No  PAST MEDICAL HISTORY:  Past Medical History:  Diagnosis Date  . Anemia   . Anxiety   . Blindness, legal   . Cataract   . Complication of anesthesia   . Difficulty sleeping   . Endometrial cancer ([place])   . Fibroids   . Glaucoma   . History of transfusion   . Hypertension   . PNA (pneumonia)   . PONV (postoperative nausea and vomiting)   . Vaginal bleeding     PAST SURGICAL HISTORY: Past Surgical History:  Procedure Laterality Date  . ABDOMINAL HYSTERECTOMY N/A 07/21/2013   Procedure: TOTAL HYSTERECTOMY ABDOMINAL ;  Surgeon: Alvino Chapel, MD;  Location: [facility];  Service: Gynecology;  Laterality: N/A;  . DILATION AND CURETTAGE OF UTERUS     . LAPAROTOMY N/A 07/21/2013   Procedure: EXPLORATORY LAPAROTOMY/ PELVIC LYMPHADENECTOMY   ;  Surgeon: Alvino Chapel, MD;  Location: [facility];  Service: Gynecology;  Laterality: N/A;  . SALPINGOOPHORECTOMY Bilateral 07/21/2013   Procedure: BILATERAL SALPINGO OOPHORECTOMY;  Surgeon: Alvino Chapel, MD;  Location: [facility];  Service: Gynecology;  Laterality: Bilateral;    FAMILY HISTORY:  Family History  Problem Relation Age of Onset  . Hypertension Father   . Vision loss Maternal Grandmother     SOCIAL HISTORY:  Social History   Tobacco Use  . Smoking status: Never Smoker  . Smokeless tobacco: Never Used  Substance Use Topics  . Alcohol use: No  . Drug use: No    ALLERGIES:  Allergies  Allergen Reactions  . Iron Anaphylaxis    IV Iron  . Iron Dextran Anaphylaxis  . Penicillins Hives and Swelling    PT, STATED ALLERGIES TO ALL " CILLINS" Tongue swelling  . Amoxicillin Other (See Comments)    Unknown reaction  . Ampicillin Other (See Comments)    Unknown reaction  . Codeine Nausea And Vomiting  . Diamox [Acetazolamide] Nausea And Vomiting, Diarrhea and Nausea Only    Other reaction(s): Cramps (ALLERGY/intolerance) Severe nausea and vomitting  . Penicillin G Swelling and Hives  . Other Nausea Only    MEDICATIONS:  Current Outpatient Medications  Medication Sig Dispense Refill  . amLODipine (NORVASC) 10 MG tablet Take by mouth.    Marland Kitchen atorvastatin (LIPITOR) 20 MG tablet Take 1 tablet (20 mg total) by mouth daily. 30 tablet 3  . brimonidine (ALPHAGAN) 0.2 % ophthalmic  solution     . busPIRone (BUSPAR) 7.5 MG tablet Take 7.5 mg by mouth 2 (two) times daily.    . dorzolamide-timolol (COSOPT) 22.3-6.8 MG/ML ophthalmic solution Place 1 drop into the left eye 2 (two) times daily.    Marland Kitchen HYDROcodone-acetaminophen (NORCO/VICODIN) 5-325 MG tablet Take 1 tablet by mouth every 6 (six) hours as needed.    Marland Kitchen lisinopril (ZESTRIL) 40 MG tablet Take 1 tablet by mouth once  daily 90 tablet 0  . metoprolol tartrate (LOPRESSOR) 25 MG tablet Take 1 tablet (25 mg total) by mouth 2 (two) times daily. 180 tablet 1  . VYZULTA 0.024 % SOLN     . hydrochlorothiazide (HYDRODIURIL) 25 MG tablet Take 1 tablet (25 mg total) by mouth daily. Discontinue amlodipine (Patient not taking: Reported on 03/11/2019) 90 tablet 1  . HYDROcodone-acetaminophen (NORCO/VICODIN) 5-325 MG tablet Take 1 tablet by mouth every 6 (six) hours as needed.    . hydrOXYzine (ATARAX/VISTARIL) 25 MG tablet Take 1 tablet (25 mg total) by mouth every 8 (eight) hours as needed for anxiety. (Patient not taking: Reported on 09/29/2019) 30 tablet 1  . magnesium 30 MG tablet Take by mouth daily.    . Netarsudil Dimesylate 0.02 % SOLN Apply to eye.    Marland Kitchen scopolamine (TRANSDERM-SCOP) 1 MG/3DAYS Apply      Apply one patch for 3 days     No current facility-administered medications for this encounter.    REVIEW OF SYSTEMS: REVIEW OF SYSTEMS: A 10+ POINT REVIEW OF SYSTEMS WAS OBTAINED including neurology, dermatology, psychiatry, cardiac, respiratory, lymph, extremities, GI, GU, musculoskeletal, constitutional, reproductive, HEENT. All pertinent positives are noted in the HPI. All others are negative.  Patient reports being legally blind due to glaucoma.  She previously instructor/professor at a university.  She reports nausea related to her pain medication   PHYSICAL EXAM:  weight is 155 lb (70.3 kg). Her temperature is 98.3 F (36.8 C). Her blood pressure is 161/85 (abnormal) and her pulse is 86. Her respiration is 20 and oxygen saturation is 98%.   General: Alert and oriented, in no acute distress HEENT: Head is normocephalic. Extraocular movements are intact. Oropharynx is clear. Neck: Neck is supple, no palpable cervical or supraclavicular lymphadenopathy. Heart: Regular in rate and rhythm with no murmurs, rubs, or gallops. Chest: Clear to auscultation bilaterally, with no rhonchi, wheezes, or rales. Abdomen:  Soft, nontender, nondistended, with no rigidity or guarding. Extremities: No cyanosis or edema. Lymphatics: see Neck Exam Skin: No concerning lesions. Musculoskeletal: symmetric strength and muscle tone throughout. Neurologic: Cranial nerves II through XII are grossly intact. No obvious focalities. Speech is fluent. Coordination is intact. Psychiatric: Judgment and insight are intact. Affect is appropriate. Pelvic exam not performed in light of recent exam by Dr. Teryl Lucy = 1  0 - Asymptomatic (Fully active, able to carry on all predisease activities without restriction)  1 - Symptomatic but completely ambulatory (Restricted in physically strenuous activity but ambulatory and able to carry out work of a light or sedentary nature. For example, light housework, office work)  2 - Symptomatic, <50% in bed during the day (Ambulatory and capable of all self care but unable to carry out any work activities. Up and about more than 50% of waking hours)  3 - Symptomatic, >50% in bed, but not bedbound (Capable of only limited self-care, confined to bed or chair 50% or more of waking hours)  4 - Bedbound (Completely disabled. Cannot carry on any self-care. Totally confined to bed  or chair)  5 - Death   Eustace Pen MM, Creech RH, Tormey DC, et al. [phone]). "Toxicity and response criteria of the [facility]". [place] Oncol. 5 (6): 649-55  LABORATORY DATA:  Lab Results  Component Value Date   WBC 6.8 10/13/2019   HGB 14.5 10/13/2019   HCT 41.3 10/13/2019   MCV 89.6 10/13/2019   PLT 235 10/13/2019   NEUTROABS 5.3 06/18/2016   Lab Results  Component Value Date   NA 140 09/29/2019   K 3.1 (L) 09/29/2019   CL 103 09/29/2019   CO2 26 09/29/2019   GLUCOSE 148 (H) 09/29/2019   CREATININE 0.84 09/29/2019   CALCIUM 9.5 09/29/2019      RADIOGRAPHY: NM PET Image Restag (PS) Skull Base To Thigh  Result Date: 10/27/2019 CLINICAL DATA:  Initial treatment strategy for  recurrent endometrial carcinoma diagnosed on left para-aortic mass biopsy from 10/13/2019. EXAM: NUCLEAR MEDICINE PET SKULL BASE TO THIGH TECHNIQUE: 7.6 mCi F-18 FDG was injected intravenously. Full-ring PET imaging was performed from the skull base to thigh after the radiotracer. CT data was obtained and used for attenuation correction and anatomic localization. Fasting blood glucose: 137 mg/dl COMPARISON:  09/22/2019 CT abdomen/pelvis. FINDINGS: Mediastinal blood pool activity: SUV max 2.9 Liver activity: SUV max NA NECK: No hypermetabolic lymph nodes in the neck. Incidental CT findings: Hypodense 1.0 cm right thyroid nodule, non hypermetabolic, requiring no follow-up. CHEST: No enlarged or hypermetabolic axillary, mediastinal or hilar lymph nodes. No hypermetabolic pulmonary findings. Incidental CT findings: Coronary atherosclerosis. Atherosclerotic nonaneurysmal thoracic aorta. No acute consolidative airspace disease or lung masses. Peripheral right upper lobe 2 mm solid pulmonary nodule is below PET resolution (series 9/image 47). No additional significant pulmonary nodules. ABDOMEN/PELVIS: There is a single poorly marginated intensely hypermetabolic nodal conglomerate in the left para-aortic and left common iliac chains measuring 5.1 x 3.4 x 8.3 cm with max SUV 17.3 (series 4/image 144). No additional enlarged or hypermetabolic lymph nodes in the abdomen or pelvis. No abnormal hypermetabolic activity within the liver, pancreas, adrenal glands, or spleen. Incidental CT findings: Subcentimeter hypodense right liver lesions are too small to characterize and require no follow-up. Mildly dilated common bile duct (7 mm diameter) and borderline mild main pancreatic duct dilation (3 mm diameter), unchanged from recent CT abdomen study. Atherosclerotic nonaneurysmal abdominal aorta. Mild left colonic diverticulosis. Hysterectomy. SKELETON: No focal hypermetabolic activity to suggest skeletal metastasis. Incidental CT  findings: none IMPRESSION: 1. Poorly marginated hypermetabolic nodal conglomerate in the left para-aortic and left common iliac chains, compatible with nodal metastatic disease. 2. No additional hypermetabolic sites of metastatic disease. 3. Right upper lobe 2 mm solid pulmonary nodule, below PET resolution, recommend attention on follow-up chest CT in 3-6 months. 4. Chronic findings include: Aortic Atherosclerosis (ICD10-I70.0). Coronary atherosclerosis. Mild left colonic diverticulosis. Mild biliary and pancreatic duct dilation of uncertain etiology, unchanged since 09/22/2019 CT. Electronically Signed   By: Ilona Sorrel M.D.   On: 10/27/2019 10:26   CT Biopsy  Result Date: 10/13/2019 INDICATION: 74 year old female with a history endometrial carcinoma, now with left paraspinal mass EXAM: CT BIOPSY MEDICATIONS: None. ANESTHESIA/SEDATION: Moderate (conscious) sedation was employed during this procedure. A total of Versed 2.0 mg and Fentanyl 100 mcg was administered intravenously. Moderate Sedation Time: 20 minutes. The patient's level of consciousness and vital signs were monitored continuously by radiology nursing throughout the procedure under my direct supervision. FLUOROSCOPY TIME:  CT COMPLICATIONS: None PROCEDURE: Informed written consent was obtained from the patient after a  thorough discussion of the procedural risks, benefits and alternatives. All questions were addressed. Maximal Sterile Barrier Technique was utilized including caps, mask, sterile gowns, sterile gloves, sterile drape, hand hygiene and skin antiseptic. A timeout was performed prior to the initiation of the procedure. Patient position prone position on the CT gantry table. Scout CT was acquired for planning purposes. The patient is prepped and draped in the usual sterile fashion. 1% lidocaine was used for local anesthesia. Using CT guidance, a 20 cm guide needle was advanced to the left paraspinal mass. Once we confirmed needle tip  position, multiple 18 gauge core biopsy were acquired. Specimen placed into fresh specimen. Needle was removed and a final image was stored. Patient tolerated the procedure well and remained hemodynamically stable throughout. No complications were encountered and no significant blood loss. IMPRESSION: Status post CT-guided biopsy of left paraspinal mass. Tissue specimen sent to pathology for complete histopathologic analysis. Signed, Dulcy Fanny. Dellia Nims, RPVI [facility] Radiology Electronically Signed   By: Corrie Mckusick D.O.   On: 10/13/2019 16:05      IMPRESSION: Recurrent stage IA grade 2 endometrial carcinoma  Patient has recurrence in the left lower periaortic and left common iliac nodal chains.  PET scan yesterday shows no other sites of involvement.  Patient will be a good candidate for an aggressive course of radiation therapy directed at this area of recurrence.  Patient's case was presented at the multidisciplinary gynecologic oncology conference earlier this week.  Additional recommendations were for consideration for chemotherapy however given the patient's overall performance status and medical issues this was not felt to be the optimal choice.  It was felt that proceeding with pembrolizumab would be something the patient can tolerate with potential for significant benefit.  This could be performed in conjunction with her planned radiation therapy.  Today, I talked to the patient and son about the findings and work-up thus far.  We discussed the natural history of recurrent endometrial carcinoma and general treatment, highlighting the role of radiotherapy in the management.  We discussed the available radiation techniques, and focused on the details of logistics and delivery.  We reviewed the anticipated acute and late sequelae associated with radiation in this setting.  The patient was encouraged to ask questions that I answered to the best of  my ability.  A patient consent form was discussed and signed.  We retained a copy for our records.  The patient would like to proceed with radiation and will be scheduled for CT simulation.  PLAN: Patient will return for CT simulation on January 18 with treatments to begin approximately a week later.  Anticipate 5-1/2 weeks of radiation therapy.    ------------------------------------------------  Blair Promise, PhD, MD  This document serves as a record of services personally performed by Gery Pray, MD. It was created on his behalf by Clerance Lav, a trained medical scribe. The creation of this record is based on the scribe's personal observations and the provider's statements to them. This document has been checked and approved by the attending provider.

## 2019-10-26 NOTE — Progress Notes (Signed)
Gynecologic Oncology Multi-Disciplinary Disposition Conference Note  Date of the Conference: 10/26/2019  Patient Name: Stephanie [name]  Primary GYN Oncologist: Dr. Berline Lopes  Stage/Disposition:  Stage 1A grade 2 recurrent endometrial cancer. Disposition is to radiation with pembrolizumab.   This Multidisciplinary conference took place involving physicians from [place], [place], Radiation Oncology, Pathology, Radiology along with the Gynecologic Oncology Nurse Practitioner and RN.  Comprehensive assessment of the patient's malignancy, staging, need for surgery, chemotherapy, radiation therapy, and need for further testing were reviewed. Supportive measures, both inpatient and following discharge were also discussed. The recommended plan of care is documented. Greater than 35 minutes were spent correlating and coordinating this patient's care.

## 2019-10-27 ENCOUNTER — Other Ambulatory Visit: Payer: Self-pay

## 2019-10-27 ENCOUNTER — Ambulatory Visit (HOSPITAL_COMMUNITY)
Admission: RE | Admit: 2019-10-27 | Discharge: 2019-10-27 | Disposition: A | Discharge status: Home / Self Care | Payer: Medicare Other | Source: Ambulatory Visit | Attending: Gynecologic Oncology | Admitting: Gynecologic Oncology

## 2019-10-27 DIAGNOSIS — I2510 Atherosclerotic heart disease of native coronary artery without angina pectoris: Secondary | ICD-10-CM | POA: Insufficient documentation

## 2019-10-27 DIAGNOSIS — R911 Solitary pulmonary nodule: Secondary | ICD-10-CM | POA: Insufficient documentation

## 2019-10-27 DIAGNOSIS — I700 Atherosclerosis of aorta: Secondary | ICD-10-CM | POA: Diagnosis not present

## 2019-10-27 DIAGNOSIS — C541 Malignant neoplasm of endometrium: Secondary | ICD-10-CM | POA: Diagnosis present

## 2019-10-27 DIAGNOSIS — K5730 Diverticulosis of large intestine without perforation or abscess without bleeding: Secondary | ICD-10-CM | POA: Insufficient documentation

## 2019-10-27 LAB — GLUCOSE, CAPILLARY: Glucose-Capillary: 137 mg/dL — ABNORMAL HIGH (ref 70–99)

## 2019-10-27 MED ORDER — FLUDEOXYGLUCOSE F - 18 (FDG) INJECTION
7.6000 | Freq: Once | INTRAVENOUS | Status: AC | PRN
  Administered 2019-10-27: 7.6 via INTRAVENOUS

## 2019-10-28 ENCOUNTER — Encounter: Payer: Self-pay | Admitting: Radiation Oncology

## 2019-10-28 ENCOUNTER — Ambulatory Visit
Admission: RE | Admit: 2019-10-28 | Discharge: 2019-10-28 | Disposition: A | Discharge status: Home / Self Care | Payer: Medicare Other | Source: Ambulatory Visit | Attending: Radiation Oncology | Admitting: Radiation Oncology

## 2019-10-28 ENCOUNTER — Telehealth: Payer: Self-pay | Admitting: Licensed Clinical Social Worker

## 2019-10-28 ENCOUNTER — Encounter: Payer: Self-pay | Admitting: Hematology and Oncology

## 2019-10-28 ENCOUNTER — Inpatient Hospital Stay: Payer: Medicare Other | Attending: Gynecologic Oncology | Admitting: Hematology and Oncology

## 2019-10-28 ENCOUNTER — Other Ambulatory Visit: Payer: Self-pay

## 2019-10-28 ENCOUNTER — Inpatient Hospital Stay: Payer: Medicare Other

## 2019-10-28 ENCOUNTER — Telehealth: Payer: Self-pay | Admitting: Oncology

## 2019-10-28 VITALS — BP 161/85 | HR 86 | Temp 98.3°F | Resp 20 | Wt 155.0 lb

## 2019-10-28 VITALS — BP 135/80 | HR 75 | Temp 98.3°F | Resp 18 | Ht 66.0 in

## 2019-10-28 DIAGNOSIS — H548 Legal blindness, as defined in USA: Secondary | ICD-10-CM | POA: Insufficient documentation

## 2019-10-28 DIAGNOSIS — Z5112 Encounter for antineoplastic immunotherapy: Secondary | ICD-10-CM | POA: Diagnosis present

## 2019-10-28 DIAGNOSIS — C541 Malignant neoplasm of endometrium: Secondary | ICD-10-CM | POA: Diagnosis present

## 2019-10-28 DIAGNOSIS — F419 Anxiety disorder, unspecified: Secondary | ICD-10-CM | POA: Insufficient documentation

## 2019-10-28 DIAGNOSIS — Z79899 Other long term (current) drug therapy: Secondary | ICD-10-CM | POA: Diagnosis not present

## 2019-10-28 DIAGNOSIS — D649 Anemia, unspecified: Secondary | ICD-10-CM | POA: Insufficient documentation

## 2019-10-28 DIAGNOSIS — I2510 Atherosclerotic heart disease of native coronary artery without angina pectoris: Secondary | ICD-10-CM | POA: Diagnosis not present

## 2019-10-28 DIAGNOSIS — I10 Essential (primary) hypertension: Secondary | ICD-10-CM | POA: Insufficient documentation

## 2019-10-28 DIAGNOSIS — R911 Solitary pulmonary nodule: Secondary | ICD-10-CM | POA: Insufficient documentation

## 2019-10-28 DIAGNOSIS — E041 Nontoxic single thyroid nodule: Secondary | ICD-10-CM | POA: Insufficient documentation

## 2019-10-28 DIAGNOSIS — R110 Nausea: Secondary | ICD-10-CM

## 2019-10-28 DIAGNOSIS — Z7189 Other specified counseling: Secondary | ICD-10-CM | POA: Diagnosis not present

## 2019-10-28 DIAGNOSIS — G893 Neoplasm related pain (acute) (chronic): Secondary | ICD-10-CM

## 2019-10-28 DIAGNOSIS — H409 Unspecified glaucoma: Secondary | ICD-10-CM | POA: Diagnosis not present

## 2019-10-28 DIAGNOSIS — K5909 Other constipation: Secondary | ICD-10-CM

## 2019-10-28 DIAGNOSIS — C772 Secondary and unspecified malignant neoplasm of intra-abdominal lymph nodes: Secondary | ICD-10-CM | POA: Diagnosis present

## 2019-10-28 NOTE — Patient Instructions (Signed)
Coronavirus (COVID-19) Are you at risk?  Are you at risk for the Coronavirus (COVID-19)?  To be considered HIGH RISK for Coronavirus (COVID-19), you have to meet the following criteria:  . Traveled to [place], [place], [place], [place] or [place]; or in the [place] to [place], [place], [place], or [place]; and have fever, cough, and shortness of breath within the last 2 weeks of travel OR . Been in close contact with a person diagnosed with COVID-19 within the last 2 weeks and have fever, cough, and shortness of breath . IF YOU DO NOT MEET THESE CRITERIA, YOU ARE CONSIDERED LOW RISK FOR COVID-19.  What to do if you are HIGH RISK for COVID-19?  . If you are having a medical emergency, call 911. . Seek medical care right away. Before you go to a doctor's office, urgent care or emergency department, call ahead and tell them about your recent travel, contact with someone diagnosed with COVID-19, and your symptoms. You should receive instructions from your physician's office regarding next steps of care.  . When you arrive at healthcare provider, tell the healthcare staff immediately you have returned from visiting [place], [place], [place], [place] or [place]; or traveled in the [place] to [place], [place], [place], or [place]; in the last two weeks or you have been in close contact with a person diagnosed with COVID-19 in the last 2 weeks.   . Tell the health care staff about your symptoms: fever, cough and shortness of breath. . After you have been seen by a medical provider, you will be either: o Tested for (COVID-19) and discharged home on quarantine except to seek medical care if symptoms worsen, and asked to  - Stay home and avoid contact with others until you get your results (4-5 days)  - Avoid travel on public transportation if possible (such as bus, train, or airplane) or o Sent to the Emergency Department by EMS for evaluation, COVID-19 testing, and possible  admission depending on your condition and test results.  What to do if you are LOW RISK for COVID-19?  Reduce your risk of any infection by using the same precautions used for avoiding the common cold or flu:  . Wash your hands often with soap and warm water for at least 20 seconds.  If soap and water are not readily available, use an alcohol-based hand sanitizer with at least 60% alcohol.  . If coughing or sneezing, cover your mouth and nose by coughing or sneezing into the elbow areas of your shirt or coat, into a tissue or into your sleeve (not your hands). . Avoid shaking hands with others and consider head nods or verbal greetings only. . Avoid touching your eyes, nose, or mouth with unwashed hands.  . Avoid close contact with people who are sick. . Avoid places or events with large numbers of people in one location, like concerts or sporting events. . Carefully consider travel plans you have or are making. . If you are planning any travel outside or inside the [place], visit the CDC's Travelers' Health webpage for the latest health notices. . If you have some symptoms but not all symptoms, continue to monitor at home and seek medical attention if your symptoms worsen. . If you are having a medical emergency, call 911.   ADDITIONAL HEALTHCARE OPTIONS FOR PATIENTS  [facility] / e-Visit: [email or web address]         [facility]: [phone]  [facility]: [phone]                   [facility]: [phone]   

## 2019-10-28 NOTE — Progress Notes (Signed)
Histology and Location of Primary Cancer: Recurrent stage IA grade 2 endometrial carcinoma  Location(s) of Symptomatic tumor(s): The patient presented to her PCP with complaints of left lower back pain and constipation.  CT scan of abdomen/pelvis on 09/22/2019 showed a retroperitoneal periaortic mass on the left, concerning for metastatic disease from known endometrial carcinoma that was initially diagnosed in 2014. The patient had undergone a total abdominal hysterectomy, bilateral salpingo-oophorectomy, and pelvic lymphadenectomy on 07/21/2013. No adjuvant therapy was recommended at that time. She tolerated the procedure well and was being followed by Dr. Fermin Schwab.  CT biopsy done on 10/13/2019 showed poorly differentiated carcinoma of left paraspinal lymph node.  Past/Anticipated chemotherapy by medical oncology, if any: initial consult with Dr. Alvy Bimler 10/28/19 at 1100   Pain on a scale of 0-10 is: Pt reports pain in lower back, rated 1/10 after taking pain pill.    SAFETY ISSUES:  Prior radiation? No   Pacemaker/ICD? No  Possible current pregnancy? No  Is the patient on methotrexate? No  Additional Complaints / other details:  Pt presents today for initial consult with Dr. Sondra Come for Radiation Oncology. Pt is accompanied by son, Cristie Hem.   BP (!) 161/85 (BP Location: Right Arm, Patient Position: Sitting, Cuff Size: Normal)   Pulse 86   Temp 98.3 F (36.8 C)   Resp 20   Wt 155 lb (70.3 kg)   SpO2 98%   BMI 25.02 kg/m   Wt Readings from Last 3 Encounters:  10/28/19 155 lb (70.3 kg)  10/13/19 153 lb (69.4 kg)  09/29/19 153 lb (69.4 kg)   Loma Sousa, RN BSN

## 2019-10-28 NOTE — Telephone Encounter (Signed)
Contacted patient to verify mychart video visit for pre reg 

## 2019-10-28 NOTE — Telephone Encounter (Signed)
Stephanie [name] with appointments for lab at 3:45 and patient education at 4:00 on Monday 11/02/19 after [place].  She verbalized understanding of appointments.

## 2019-10-29 ENCOUNTER — Encounter: Payer: Self-pay | Admitting: Licensed Clinical Social Worker

## 2019-10-29 ENCOUNTER — Inpatient Hospital Stay (HOSPITAL_BASED_OUTPATIENT_CLINIC_OR_DEPARTMENT_OTHER): Payer: Medicare Other | Admitting: Licensed Clinical Social Worker

## 2019-10-29 ENCOUNTER — Telehealth: Payer: Self-pay | Admitting: Hematology and Oncology

## 2019-10-29 DIAGNOSIS — C541 Malignant neoplasm of endometrium: Secondary | ICD-10-CM | POA: Diagnosis not present

## 2019-10-29 DIAGNOSIS — Z801 Family history of malignant neoplasm of trachea, bronchus and lung: Secondary | ICD-10-CM | POA: Insufficient documentation

## 2019-10-29 DIAGNOSIS — K5909 Other constipation: Secondary | ICD-10-CM | POA: Insufficient documentation

## 2019-10-29 DIAGNOSIS — G893 Neoplasm related pain (acute) (chronic): Secondary | ICD-10-CM | POA: Insufficient documentation

## 2019-10-29 DIAGNOSIS — Z7189 Other specified counseling: Secondary | ICD-10-CM | POA: Insufficient documentation

## 2019-10-29 DIAGNOSIS — Z800 Family history of malignant neoplasm of digestive organs: Secondary | ICD-10-CM | POA: Diagnosis not present

## 2019-10-29 DIAGNOSIS — R110 Nausea: Secondary | ICD-10-CM | POA: Insufficient documentation

## 2019-10-29 HISTORY — DX: Other specified counseling: Z71.89

## 2019-10-29 HISTORY — DX: Other constipation: K59.09

## 2019-10-29 NOTE — Progress Notes (Signed)
START OFF PATHWAY REGIMEN - Uterine   [id number]:Pembrolizumab 200 mg q21 Days:   A cycle is 21 days:     Pembrolizumab   **Always confirm dose/schedule in your pharmacy ordering system**  Patient Characteristics: Endometrioid Histology, Recurrent/Progressive Disease, Second Line, Systemic Recurrence, No Previous Chemotherapy Histology: Endometrioid Histology Therapeutic Status: Recurrent or Progressive Disease AJCC T Category: T1a AJCC N Category: N2 AJCC M Category: M0 AJCC 8 Stage Grouping: IIIC2 Line of Therapy: Second Line Time to Recurrence: No Previous Chemotherapy Intent of Therapy: Non-Curative / Palliative Intent, Discussed with Patient

## 2019-10-29 NOTE — Progress Notes (Signed)
REFERRING PROVIDER: [name], MD [place],  [place] [id number]  PRIMARY PROVIDER:  Buzzy Han, MD  PRIMARY REASON FOR VISIT:  1. Endometrial cancer ([place])   2. Family history of lung cancer   3. Family history of liver cancer     I connected with Stephanie [name] on 10/29/2019 at 9:15 AM EDT by [place] video conference and verified that I am speaking with the correct person using two identifiers.    Patient location: home Provider location: clinic  HISTORY OF PRESENT ILLNESS:   Stephanie [name], a 74 y.o. female, was seen for a [facility] cancer genetics consultation at the request of Dr. Alvy Bimler due to a personal history of endometrial cancer with abnormal mismatch repair results.  Stephanie [name] presents to clinic today to discuss the possibility of a hereditary predisposition to cancer, genetic testing, and to further clarify her future cancer risks, as well as potential cancer risks for family members.   In 2014, at the age of 26, Stephanie [name] was diagnosed with endometrial cancer. This was treated with TAH BSO.  In 2021, at the age of 6, she was diagnosed with a recurrence of this cancer. A biopsy of her lymph node showed abnormal mismatch repair results, with loss of MLH1, MSH6 and PMS2.     CANCER HISTORY:  Oncology History Overview Note  MMR - Abnormal   Endometrial cancer ([place])  06/01/2013 Initial Diagnosis   Endometrial cancer   06/01/2013 Initial Biopsy   EMB for AUB Endometrium, biopsy - POSITIVE FOR ENDOMETRIAL ADENOCARCINOMA, SEE COMMENT. Microscopic Comment As sampled the endometrial adenocarcinoma appears to be endometrioid type, FIGO Grade I.   07/21/2013 Surgery   PREOPERATIVE DIAGNOSIS: Grade 1 endometrial carcinoma. Uterine fibroids    PROCEDURE: Total abdominal hysterectomy, bilateral salpingo-oophorectomy, pelvic lymphadenectomy   SURGEON: [name], M.D  SURGICAL FINDINGS: At the time of exploratory laparotomy  the uterus was approximately 16-18 weeks size with multiple subserosal fibroids. Tubes and ovaries were normal. On frozen section patient had endometrial carcinoma invading only the inner half of the myometrium. Exploration the upper abdomen revealed no evidence of metastatic disease. There was no pelvic or para-aortic lymphadenopathy.     07/21/2013 Pathologic Stage   1. Uterus +/- tubes/ovaries, neoplastic - INVASIVE ENDOMETRIOID CARCINOMA, FIGO GRADE II, WITH SQUAMOUS DIFFERENTIATION, CONFINED WITHIN INNER HALF OF THE MYOMETRIUM. - LEIOMYOMATA AND ADENOMYOSIS. - CERVIX: BENIGN SQUAMOUS MUCOSA AND ENDOCERVICAL MUCOSA, NO DYSPLASIA OR MALIGNANCY. - BILATERAL OVARIES AND FALLOPIAN TUBES: NO PATHOLOGIC ABNORMALITIES. 2. Lymph nodes, regional resection, left pelvic - FOUR LYMPH NODES, NO EVIDENCE OF METASTATIC CARCINOMA (0/4). 3. Lymph nodes, regional resection, right pelvic - THREE LYMPH NODES, NEGATIVE FOR METASTATIC CARCINOMA (0/3). Microscopic Comment 1. ONCOLOGY TABLE - UTERUS, CARCINOMA OR CARCINOSARCOMA Specimen: Uterus, cervix, bilateral ovaries and fallopian tubes Procedure: Total hysterectomy and bilateral salpingo-oophorectomy Lymph node sampling performed: Yes Specimen integrity: Intact Maximum tumor size (cm): 2.4 cm, gross measurement Histologic type: Invasive endometrioid carcinoma with squamous differentiation Grade: FIGO II Myometrial invasion: 1.1 cm where myometrium is 3.7 cm in thickness Cervical stromal involvement: Not identified Extent of involvement of other organs: No Lymph vascular invasion: Not identified Peritoneal washings: Negative (Please correlate with [id number]) Lymph nodes: number examined 7; number positive 0 Pelvic lymph nodes: 0 involved of 7 lymph nodes Para-aortic lymph nodes: N/A TNM code: pT1a, pN0 FIGO Stage (based on pathologic findings, needs clinical correlation): IA   09/22/2019 Imaging   CT A/P: There is a left paraspinous mass which is  poorly  defined and low-density measuring 3 x 2.2 by 5.5 cm. Deviates the left ureter laterally. No additional periaortic masses are identified.   10/13/2019 Pathology Results   A. LYMPH NODE, LEFT PARASPINAL, NEEDLE CORE BIOPSY: - Poorly differentiated carcinoma. COMMENT: Poorly differentiated carcinoma is present in the background of a majority of necrosis and fibrosis tissue. No distinct nodal tissue is identified. Immunohistochemistry for CK7, PAX 8 and ER is positive. CK20, TTF-1, CDX-2 and GATA-3 are negative. The immunophenotype is compatible with the provided clinical history of gynecologic cancer.    Genetic Testing   Patient has genetic testing done for MMR. Results revealed patient has the following mutation(s): MMR - Abnormal   10/27/2019 PET scan   1. Poorly marginated hypermetabolic nodal conglomerate in the left para-aortic and left common iliac chains, compatible with nodal metastatic disease. 2. No additional hypermetabolic sites of metastatic disease. 3. Right upper lobe 2 mm solid pulmonary nodule, below PET resolution, recommend attention on follow-up chest CT in 3-6 months. 4. Chronic findings include: Aortic Atherosclerosis (ICD10-I70.0). Coronary atherosclerosis. Mild left colonic diverticulosis. Mild biliary and pancreatic duct dilation of uncertain etiology, unchanged since 09/22/2019 CT.   11/10/2019 -  Chemotherapy   The patient had pembrolizumab (KEYTRUDA) 200 mg in sodium chloride 0.9 % 50 mL chemo infusion, 200 mg, Intravenous, Once, 0 of 6 cycles  for chemotherapy treatment.       RISK FACTORS:  Menarche was at age 75  First live birth at age 61.  OCP use for approximately 6 years.  Ovaries intact: no.  Hysterectomy: yes.  Menopausal status: postmenopausal.  HRT use: 0 years. Colonoscopy: no; has done Cologuard. Mammogram within the last year: no. Number of breast biopsies: 0. Any excessive radiation exposure in the past: no  Past Medical History:   Diagnosis Date  . Anemia   . Anxiety   . Blindness, legal   . Cataract   . Complication of anesthesia   . Difficulty sleeping   . Endometrial cancer ([place])   . Family history of liver cancer   . Family history of lung cancer   . Fibroids   . Glaucoma   . History of transfusion   . Hypertension   . PNA (pneumonia)   . PONV (postoperative nausea and vomiting)   . Vaginal bleeding     Past Surgical History:  Procedure Laterality Date  . ABDOMINAL HYSTERECTOMY N/A 07/21/2013   Procedure: TOTAL HYSTERECTOMY ABDOMINAL ;  Surgeon: Alvino Chapel, MD;  Location: [facility];  Service: Gynecology;  Laterality: N/A;  . DILATION AND CURETTAGE OF UTERUS    . LAPAROTOMY N/A 07/21/2013   Procedure: EXPLORATORY LAPAROTOMY/ PELVIC LYMPHADENECTOMY   ;  Surgeon: Alvino Chapel, MD;  Location: [facility];  Service: Gynecology;  Laterality: N/A;  . SALPINGOOPHORECTOMY Bilateral 07/21/2013   Procedure: BILATERAL SALPINGO OOPHORECTOMY;  Surgeon: Alvino Chapel, MD;  Location: [facility];  Service: Gynecology;  Laterality: Bilateral;    Social History   Socioeconomic History  . Marital status: Divorced    Spouse name: Not on file  . Number of children: 1  . Years of education: Not on file  . Highest education level: Not on file  Occupational History  . Not on file  Tobacco Use  . Smoking status: Never Smoker  . Smokeless tobacco: Never Used  Substance and Sexual Activity  . Alcohol use: No  . Drug use: No  . Sexual activity: Not Currently    Birth control/protection: None  Other Topics Concern  .  Not on file  Social History Narrative  . Not on file   Social Determinants of Health   Financial Resource Strain:   . Difficulty of Paying Living Expenses: Not on file  Food Insecurity:   . Worried About Charity fundraiser in the Last Year: Not on file  . Ran Out of Food in the Last Year: Not on file  Transportation Needs:   . Lack of Transportation (Medical): Not on file   . Lack of Transportation (Non-Medical): Not on file  Physical Activity:   . Days of Exercise per Week: Not on file  . Minutes of Exercise per Session: Not on file  Stress:   . Feeling of Stress : Not on file  Social Connections:   . Frequency of Communication with Friends and Family: Not on file  . Frequency of Social Gatherings with Friends and Family: Not on file  . Attends Religious Services: Not on file  . Active Member of Clubs or Organizations: Not on file  . Attends Archivist Meetings: Not on file  . Marital Status: Not on file     FAMILY HISTORY:  We obtained a detailed, 4-generation family history.  Significant diagnoses are listed below: Family History  Problem Relation Age of Onset  . Liver cancer Mother 76  . Hypertension Father   . Vision loss Maternal Grandmother   . Lung cancer Paternal Uncle    Stephanie [name] has 1 son, 34, no history of cancer. She does not have siblings.  Stephanie [name] mother had metastatic liver cancer at 70 and died at 89. Patient had 2 maternal uncles, no cancers. No known cancers in maternal cousins. Maternal grandmother passed at 71, grandfather passed in his 26s.  Stephanie [name] father died at 35, no cancer history. Patient had 3 paternal uncles. One of her uncles had lung cancer and history of smoking. No known cancers in paternal cousins. Paternal grandmother died in a car accident at 53, grandfather passed in his 46s.  Stephanie [name] is unaware of previous family history of genetic testing for hereditary cancer risks. Patient's maternal ancestors are from [place] and paternal ancestors are of [place] descent. There is no reported Ashkenazi Jewish ancestry. There is no known consanguinity.  GENETIC COUNSELING ASSESSMENT: Stephanie [name] is a 74 y.o. female with a personal history which is somewhat suggestive of a hereditary cancer syndrome such as Lynch syndrome and predisposition to cancer. We, therefore, discussed and recommended  the following at today's visit.   DISCUSSION: We discussed that 5 - 7% of endometrial cancer is hereditary, with most cases associated with Lynch syndrome.  There are other genes that can be associated with hereditary endometrial cancer syndromes.  We reviewed that she meets criteria based on her abnormal tumor test result.  We discussed that testing is beneficial for several reasons including knowing how to follow individuals after completing their treatment, and understand if other family members could be at risk for cancer and allow them to undergo genetic testing.   We reviewed the characteristics, features and inheritance patterns of hereditary cancer syndromes. We also discussed genetic testing, including the appropriate family members to test, the process of testing, insurance coverage and turn-around-time for results. We discussed the implications of a negative, positive and/or variant of uncertain significant result. We recommended Stephanie [name] pursue genetic testing for the [employer] gene panel.   The CancerNext+RNAinsight gene panel offered by [facility] includes sequencing and rearrangement analysis for the following 36 genes: APC*, ATM*,  AXIN2, BARD1, BMPR1A, BRCA1*, BRCA2*, BRIP1*, CDH1*, CDK4, CDKN2A, CHEK2*, DICER1, MLH1*, MSH2*, MSH3, MSH6*, MUTYH*, NBN, NF1*, NTHL1, PALB2*, PMS2*, PTEN*, RAD51C*, RAD51D*, RECQL, SMAD4, SMARCA4, STK11 and TP53* (sequencing and deletion/duplication); HOXB13, POLD1 and POLE (sequencing only); EPCAM and GREM1 (deletion/duplication only). DNA and RNA analyses performed for * genes.   Based on Stephanie [name]'s personal history of cancer, she meets medical criteria for genetic testing. Despite that she meets criteria, she may still have an out of pocket cost. We discussed that if her out of pocket cost for testing is over $100, the laboratory will call and confirm whether she wants to proceed with testing.  If the out of pocket cost of testing is less  than $100 she will be billed by the genetic testing laboratory.   PLAN: After considering the risks, benefits, and limitations, Stephanie [name] provided informed consent to pursue genetic testing. She will have her blood drawn on 11/02/2019 and the blood sample will be sent to [facility] for analysis of the CancerNext panel. Results should be available within approximately 2-3 weeks' time, at which point they will be disclosed by telephone to Stephanie [name], as will any additional recommendations warranted by these results. Stephanie [name] will receive a summary of her genetic counseling visit and a copy of her results once available. This information will also be available in Epic.   Stephanie [name] questions were answered to her satisfaction today. Our contact information was provided should additional questions or concerns arise. Thank you for the referral and allowing [place] to share in the care of your patient.   Faith Rogue, MS, [facility] Genetic Counselor [email or web address] Phone: [phone]  The patient was seen for a total of 30 minutes in virtual genetic counseling.  Drs. Magrinat, Lindi Adie and/or Burr Medico were available for discussion regarding this case.   _______________________________________________________________________ For Office Staff:  Number of people involved in session: 1 Was an Intern/ student involved with case: no

## 2019-10-29 NOTE — Assessment & Plan Note (Signed)
I reviewed the plan of care with the patient and her son She has recurrence of cancer consistent with prior diagnosis Her case was presented at the GYN oncology tumor board and overall consensus is for her to proceed with radiation therapy along with pembrolizumab, given recent findings of abnormal MMR testing on her biopsy sample We discussed the need for chemo education class I did not get a chance to review the goals of care with the patient extensively because she was feeling unwell with severe nausea I plan to see her again before the start date of treatment With recurrence of disease, the goals of care is palliative in nature  We reviewed the guidelines Treatment intent is strictly palliative The treatment decision is based on the following publication for patients who tested positive for PD-L1 or MSI high  Pembrolizumab in advanced endometrial cancer: Preliminary results from the phase Ib KEYNOTE-028 study  DOI: 10.1200/JCO.2016.34.15_suppl.5581 Journal of Clinical Oncology 34, no. 15_suppl (Mar 04 2015) 8341-9622.  Published online Feb 23, 2016.  Background: There are currently no treatment options for pts with endometrial cancer who progress on chemotherapy. Pembrolizumab is an anti-PD-1 antibody that blocks interaction between PD-1 and its ligands, PD-L1/PD-L2. The safety and efficacy of pembrolizumab were evaluated in advanced solid tumors in the [place] study ([id number]). Preliminary results from the endometrial carcinoma cohort are presented.   Methods: Key inclusion criteria were advanced endometrial carcinoma (excluded sarcomas, mesenchymal tumors), failure of prior systemic therapy, ECOG PS 0-1, and PD-L1 expression in ? 1% of tumor or stroma cells by IHC. Pembrolizumab 10 mg/kg every 2 wk was given for up to 24 mo or until confirmed progression, intolerable toxicity, death, or consent withdrawal. Response was assessed every 8 wk for the first 6 mo and every 12 wk thereafter.  The primary end point was ORR per RECIST v1.1 by investigator assessment.   Results: In the 24 pts enrolled, median age was 79.0 y; 66.7% had an ECOG PS of 1; 62.4% received ? 2 prior therapies for metastatic disease. As of Sep 23, 2014, median follow-up duration was 69.9 wk (range, 5.4-84.4). Treatment-related AEs (TRAEs) occurred in 13 (54.2%) pts; most common were pruritus (n = 4, 16.7%), asthenia, fatigue, pyrexia, and decreased appetite (n = 3 each; 12.5%). Three pts experienced grade 3 TRAEs: 1 pt had back pain and asthenia (both resolved), 1 pt had diarrhea (resolved), 1 pt had asthenia, anemia, hyperglycemia, and hyponatremia (all unresolved). No pts died or discontinued pembrolizumab because of a TRAE. ORR (confirmed) was 13.0% (PR, n = 3; 95% CI, 2.8-33.6); median duration of response was not yet reached (range, 40.3+ to 65.1+ wk). Three pts achieved stable disease (13%; 95% CI, 2.8-33.6; median duration, 24.6 wk [range, 13.1-24.6]). Six-mo PFS and OS rates were 19.0% and 68.8%, respectively. At data cutoff, all 3 pts with PRs remained on treatment.   In the most recent update published in January 2020 at [facility], objective response rate was 34.3% (95% CI, 28.3% to 40.8%). Median progression-free survival was 4.1 months (95% CI, 2.4 to 4.9 months) and median overall survival was 23.5 months (95% CI, 13.5 months to not reached). Treatment-related adverse events occurred in 151 patients (64.8%). Thirty-four patients (14.6%) had grade 3 to 5 treatment-related adverse events. Grade 5 pneumonia occurred in one patient; there were no other treatment-related fatal adverse events.  Conclusions: Pembrolizumab demonstrated an acceptable safety profile and preliminary antitumor activity in heavily pretreated PD-L1+ advanced endometrial cancer pts. The clinical benefit of pembrolizumab for advanced endometrial cancer  is being further investigated in the phase 2 KEYNOTE-158 trial ([id number])  We discussed the  importance of TSH monitoring I recommend minimum 3 months of treatment before repeat imaging study Based on other recent publication on flat dose treatment available, I am modifying pembrolizumab to 200 mg flat dose every 3 weeks

## 2019-10-29 NOTE — Assessment & Plan Note (Signed)
She has poorly controlled pain secondary to malignancy She was recently seen by her primary care doctor who prescribed hydrocodone for her Her pain is stable right now

## 2019-10-29 NOTE — Assessment & Plan Note (Signed)
She has been prescribed antiemetics by her primary care doctor We discussed the importance of aggressive laxative therapy as constipation can contribute to nausea

## 2019-10-29 NOTE — Assessment & Plan Note (Signed)
We discussed the importance of aggressive laxative while on narcotic prescription

## 2019-10-29 NOTE — Progress Notes (Signed)
[place] CONSULT NOTE  Patient Care Team: Buzzy Han, MD as PCP - General (Family Medicine)  ASSESSMENT & PLAN:  Endometrial cancer [facility]) I reviewed the plan of care with the patient and her son She has recurrence of cancer consistent with prior diagnosis Her case was presented at the GYN oncology tumor board and overall consensus is for her to proceed with radiation therapy along with pembrolizumab, given recent findings of abnormal MMR testing on her biopsy sample We discussed the need for chemo education class I did not get a chance to review the goals of care with the patient extensively because she was feeling unwell with severe nausea I plan to see her again before the start date of treatment With recurrence of disease, the goals of care is palliative in nature  We reviewed the guidelines Treatment intent is strictly palliative The treatment decision is based on the following publication for patients who tested positive for PD-L1 or MSI high  Pembrolizumab in advanced endometrial cancer: Preliminary results from the phase Ib KEYNOTE-028 study  DOI: 10.1200/JCO.2016.34.15_suppl.5581 Journal of Clinical Oncology 34, no. 15_suppl (Mar 04 2015) 1607-3710.  Published online Feb 23, 2016.  Background: There are currently no treatment options for pts with endometrial cancer who progress on chemotherapy. Pembrolizumab is an anti-PD-1 antibody that blocks interaction between PD-1 and its ligands, PD-L1/PD-L2. The safety and efficacy of pembrolizumab were evaluated in advanced solid tumors in the [place] study ([id number]). Preliminary results from the endometrial carcinoma cohort are presented.   Methods: Key inclusion criteria were advanced endometrial carcinoma (excluded sarcomas, mesenchymal tumors), failure of prior systemic therapy, ECOG PS 0-1, and PD-L1 expression in ? 1% of tumor or stroma cells by IHC. Pembrolizumab 10 mg/kg every 2 wk was given for  up to 24 mo or until confirmed progression, intolerable toxicity, death, or consent withdrawal. Response was assessed every 8 wk for the first 6 mo and every 12 wk thereafter. The primary end point was ORR per RECIST v1.1 by investigator assessment.   Results: In the 24 pts enrolled, median age was 31.0 y; 66.7% had an ECOG PS of 1; 62.4% received ? 2 prior therapies for metastatic disease. As of Sep 23, 2014, median follow-up duration was 69.9 wk (range, 5.4-84.4). Treatment-related AEs (TRAEs) occurred in 13 (54.2%) pts; most common were pruritus (n = 4, 16.7%), asthenia, fatigue, pyrexia, and decreased appetite (n = 3 each; 12.5%). Three pts experienced grade 3 TRAEs: 1 pt had back pain and asthenia (both resolved), 1 pt had diarrhea (resolved), 1 pt had asthenia, anemia, hyperglycemia, and hyponatremia (all unresolved). No pts died or discontinued pembrolizumab because of a TRAE. ORR (confirmed) was 13.0% (PR, n = 3; 95% CI, 2.8-33.6); median duration of response was not yet reached (range, 40.3+ to 65.1+ wk). Three pts achieved stable disease (13%; 95% CI, 2.8-33.6; median duration, 24.6 wk [range, 13.1-24.6]). Six-mo PFS and OS rates were 19.0% and 68.8%, respectively. At data cutoff, all 3 pts with PRs remained on treatment.   In the most recent update published in January 2020 at [facility], objective response rate was 34.3% (95% CI, 28.3% to 40.8%). Median progression-free survival was 4.1 months (95% CI, 2.4 to 4.9 months) and median overall survival was 23.5 months (95% CI, 13.5 months to not reached). Treatment-related adverse events occurred in 151 patients (64.8%). Thirty-four patients (14.6%) had grade 3 to 5 treatment-related adverse events. Grade 5 pneumonia occurred in one patient; there were no other treatment-related fatal adverse events.  Conclusions:  Pembrolizumab demonstrated an acceptable safety profile and preliminary antitumor activity in heavily pretreated PD-L1+ advanced endometrial cancer  pts. The clinical benefit of pembrolizumab for advanced endometrial cancer is being further investigated in the phase 2 KEYNOTE-158 trial ([id number])  We discussed the importance of TSH monitoring I recommend minimum 3 months of treatment before repeat imaging study Based on other recent publication on flat dose treatment available, I am modifying pembrolizumab to 200 mg flat dose every 3 weeks  Cancer associated pain She has poorly controlled pain secondary to malignancy She was recently seen by her primary care doctor who prescribed hydrocodone for her Her pain is stable right now  Nausea without vomiting She has been prescribed antiemetics by her primary care doctor We discussed the importance of aggressive laxative therapy as constipation can contribute to nausea  Other constipation We discussed the importance of aggressive laxative while on narcotic prescription  Goals of care, counseling/discussion I briefly discussed the goals of care with the patient but I do not believe she purely across the severity of his situation The patient look fairly unwell with severe nausea and we terminated the discussion prematurely. The goals of care is palliative in nature given recurrence of disease   Orders Placed This Encounter  Procedures  . CBC with Differential (Cancer Center Only)    Standing Status:   Standing    Number of Occurrences:   20    Standing Expiration Date:   10/28/2020  . CMP ([place] only)    Standing Status:   Standing    Number of Occurrences:   20    Standing Expiration Date:   10/28/2020  . TSH    Standing Status:   Standing    Number of Occurrences:   20    Standing Expiration Date:   10/28/2020    The total time spent in the appointment was 60 minutes encounter with patients including review of chart and various tests results, discussions about plan of care and coordination of care plan   All questions were answered. The patient knows to call the clinic  with any problems, questions or concerns. No barriers to learning was detected.  [name], MD 1/14/20217:51 AM  CHIEF COMPLAINTS/PURPOSE OF CONSULTATION:  Recurrent uterine cancer, MMR abnormal  HISTORY OF PRESENTING ILLNESS:  Stephanie [name] 74 y.o. female is here because of recent findings of recurrent uterine cancer.  Her son, Cristie Hem is present with her At the time of visit, the patient is sitting on the wheelchair.  She looked unwell and holding onto her back due to nausea She requested her chemo education class and other appointments to be rescheduled due to feeling unwell The patient is legally blind due to glaucoma She was recently found to have recurrence of cancer due to presentation of lower back pain.  Her original diagnosis was in 2014 when she presented with postmenopausal bleeding Due to early stage disease, she did not receive adjuvant chemotherapy after her surgery in 2014  I have reviewed her chart and materials related to her cancer extensively and collaborated history with the patient. Summary of oncologic history is as follows: Oncology History Overview Note  MMR - Abnormal   Endometrial cancer ([place])  06/01/2013 Initial Diagnosis   Endometrial cancer   06/01/2013 Initial Biopsy   EMB for AUB Endometrium, biopsy - POSITIVE FOR ENDOMETRIAL ADENOCARCINOMA, SEE COMMENT. Microscopic Comment As sampled the endometrial adenocarcinoma appears to be endometrioid type, FIGO Grade I.   07/21/2013 Surgery   PREOPERATIVE DIAGNOSIS:  Grade 1 endometrial carcinoma. Uterine fibroids    PROCEDURE: Total abdominal hysterectomy, bilateral salpingo-oophorectomy, pelvic lymphadenectomy   SURGEON: [name], M.D  SURGICAL FINDINGS: At the time of exploratory laparotomy the uterus was approximately 16-18 weeks size with multiple subserosal fibroids. Tubes and ovaries were normal. On frozen section patient had endometrial carcinoma invading only the inner half of the  myometrium. Exploration the upper abdomen revealed no evidence of metastatic disease. There was no pelvic or para-aortic lymphadenopathy.     07/21/2013 Pathologic Stage   1. Uterus +/- tubes/ovaries, neoplastic - INVASIVE ENDOMETRIOID CARCINOMA, FIGO GRADE II, WITH SQUAMOUS DIFFERENTIATION, CONFINED WITHIN INNER HALF OF THE MYOMETRIUM. - LEIOMYOMATA AND ADENOMYOSIS. - CERVIX: BENIGN SQUAMOUS MUCOSA AND ENDOCERVICAL MUCOSA, NO DYSPLASIA OR MALIGNANCY. - BILATERAL OVARIES AND FALLOPIAN TUBES: NO PATHOLOGIC ABNORMALITIES. 2. Lymph nodes, regional resection, left pelvic - FOUR LYMPH NODES, NO EVIDENCE OF METASTATIC CARCINOMA (0/4). 3. Lymph nodes, regional resection, right pelvic - THREE LYMPH NODES, NEGATIVE FOR METASTATIC CARCINOMA (0/3). Microscopic Comment 1. ONCOLOGY TABLE - UTERUS, CARCINOMA OR CARCINOSARCOMA Specimen: Uterus, cervix, bilateral ovaries and fallopian tubes Procedure: Total hysterectomy and bilateral salpingo-oophorectomy Lymph node sampling performed: Yes Specimen integrity: Intact Maximum tumor size (cm): 2.4 cm, gross measurement Histologic type: Invasive endometrioid carcinoma with squamous differentiation Grade: FIGO II Myometrial invasion: 1.1 cm where myometrium is 3.7 cm in thickness Cervical stromal involvement: Not identified Extent of involvement of other organs: No Lymph vascular invasion: Not identified Peritoneal washings: Negative (Please correlate with [id number]) Lymph nodes: number examined 7; number positive 0 Pelvic lymph nodes: 0 involved of 7 lymph nodes Para-aortic lymph nodes: N/A TNM code: pT1a, pN0 FIGO Stage (based on pathologic findings, needs clinical correlation): IA   09/22/2019 Imaging   CT A/P: There is a left paraspinous mass which is poorly defined and low-density measuring 3 x 2.2 by 5.5 cm. Deviates the left ureter laterally. No additional periaortic masses are identified.   10/13/2019 Pathology Results   A. LYMPH NODE, LEFT  PARASPINAL, NEEDLE CORE BIOPSY: - Poorly differentiated carcinoma. COMMENT: Poorly differentiated carcinoma is present in the background of a majority of necrosis and fibrosis tissue. No distinct nodal tissue is identified. Immunohistochemistry for CK7, PAX 8 and ER is positive. CK20, TTF-1, CDX-2 and GATA-3 are negative. The immunophenotype is compatible with the provided clinical history of gynecologic cancer.    Genetic Testing   Patient has genetic testing done for MMR. Results revealed patient has the following mutation(s): MMR - Abnormal   10/27/2019 PET scan   1. Poorly marginated hypermetabolic nodal conglomerate in the left para-aortic and left common iliac chains, compatible with nodal metastatic disease. 2. No additional hypermetabolic sites of metastatic disease. 3. Right upper lobe 2 mm solid pulmonary nodule, below PET resolution, recommend attention on follow-up chest CT in 3-6 months. 4. Chronic findings include: Aortic Atherosclerosis (ICD10-I70.0). Coronary atherosclerosis. Mild left colonic diverticulosis. Mild biliary and pancreatic duct dilation of uncertain etiology, unchanged since 09/22/2019 CT.    Due to severe back pain, she was recently prescribed narcotic prescription by her primary care doctor Her back pain is stable while on pain medication She complains of loss of appetite with an unspecified weight loss She is not weighed today because she felt too weak She complained of severe nausea and was prescribed antiemetics but not able to pick that up yet She also have some mild constipation recently.  Her last bowel movement was yesterday  MEDICAL HISTORY:  Past Medical History:  Diagnosis Date  . Anemia   .  Anxiety   . Blindness, legal   . Cataract   . Complication of anesthesia   . Difficulty sleeping   . Endometrial cancer ([place])   . Fibroids   . Glaucoma   . History of transfusion   . Hypertension   . PNA (pneumonia)   . PONV (postoperative nausea and  vomiting)   . Vaginal bleeding     SURGICAL HISTORY: Past Surgical History:  Procedure Laterality Date  . ABDOMINAL HYSTERECTOMY N/A 07/21/2013   Procedure: TOTAL HYSTERECTOMY ABDOMINAL ;  Surgeon: Alvino Chapel, MD;  Location: [facility];  Service: Gynecology;  Laterality: N/A;  . DILATION AND CURETTAGE OF UTERUS    . LAPAROTOMY N/A 07/21/2013   Procedure: EXPLORATORY LAPAROTOMY/ PELVIC LYMPHADENECTOMY   ;  Surgeon: Alvino Chapel, MD;  Location: [facility];  Service: Gynecology;  Laterality: N/A;  . SALPINGOOPHORECTOMY Bilateral 07/21/2013   Procedure: BILATERAL SALPINGO OOPHORECTOMY;  Surgeon: Alvino Chapel, MD;  Location: [facility];  Service: Gynecology;  Laterality: Bilateral;    SOCIAL HISTORY: Social History   Socioeconomic History  . Marital status: Divorced    Spouse name: Not on file  . Number of children: 1  . Years of education: Not on file  . Highest education level: Not on file  Occupational History  . Not on file  Tobacco Use  . Smoking status: Never Smoker  . Smokeless tobacco: Never Used  Substance and Sexual Activity  . Alcohol use: No  . Drug use: No  . Sexual activity: Not Currently    Birth control/protection: None  Other Topics Concern  . Not on file  Social History Narrative  . Not on file   Social Determinants of Health   Financial Resource Strain:   . Difficulty of Paying Living Expenses: Not on file  Food Insecurity:   . Worried About Charity fundraiser in the Last Year: Not on file  . Ran Out of Food in the Last Year: Not on file  Transportation Needs:   . Lack of Transportation (Medical): Not on file  . Lack of Transportation (Non-Medical): Not on file  Physical Activity:   . Days of Exercise per Week: Not on file  . Minutes of Exercise per Session: Not on file  Stress:   . Feeling of Stress : Not on file  Social Connections:   . Frequency of Communication with Friends and Family: Not on file  . Frequency of Social  Gatherings with Friends and Family: Not on file  . Attends Religious Services: Not on file  . Active Member of Clubs or Organizations: Not on file  . Attends Archivist Meetings: Not on file  . Marital Status: Not on file  Intimate Partner Violence:   . Fear of Current or Ex-Partner: Not on file  . Emotionally Abused: Not on file  . Physically Abused: Not on file  . Sexually Abused: Not on file    FAMILY HISTORY: Family History  Problem Relation Age of Onset  . Hypertension Father   . Vision loss Maternal Grandmother     ALLERGIES:  is allergic to iron; iron dextran; penicillins; amoxicillin; ampicillin; codeine; diamox [acetazolamide]; penicillin g; and other.  MEDICATIONS:  Current Outpatient Medications  Medication Sig Dispense Refill  . HYDROcodone-acetaminophen (NORCO/VICODIN) 5-325 MG tablet Take 1 tablet by mouth every 6 (six) hours as needed.    Marland Kitchen amLODipine (NORVASC) 10 MG tablet Take by mouth.    Marland Kitchen atorvastatin (LIPITOR) 20 MG tablet Take 1 tablet (  20 mg total) by mouth daily. 30 tablet 3  . brimonidine (ALPHAGAN) 0.2 % ophthalmic solution     . busPIRone (BUSPAR) 7.5 MG tablet Take 7.5 mg by mouth 2 (two) times daily.    . dorzolamide-timolol (COSOPT) 22.3-6.8 MG/ML ophthalmic solution Place 1 drop into the left eye 2 (two) times daily.    . hydrochlorothiazide (HYDRODIURIL) 25 MG tablet Take 1 tablet (25 mg total) by mouth daily. Discontinue amlodipine (Patient not taking: Reported on 03/11/2019) 90 tablet 1  . HYDROcodone-acetaminophen (NORCO/VICODIN) 5-325 MG tablet Take 1 tablet by mouth every 6 (six) hours as needed.    . hydrOXYzine (ATARAX/VISTARIL) 25 MG tablet Take 1 tablet (25 mg total) by mouth every 8 (eight) hours as needed for anxiety. (Patient not taking: Reported on 09/29/2019) 30 tablet 1  . lisinopril (ZESTRIL) 40 MG tablet Take 1 tablet by mouth once daily 90 tablet 0  . magnesium 30 MG tablet Take by mouth daily.    . metoprolol tartrate  (LOPRESSOR) 25 MG tablet Take 1 tablet (25 mg total) by mouth 2 (two) times daily. 180 tablet 1  . Netarsudil Dimesylate 0.02 % SOLN Apply to eye.    Marland Kitchen scopolamine (TRANSDERM-SCOP) 1 MG/3DAYS Apply      Apply one patch for 3 days    . VYZULTA 0.024 % SOLN      No current facility-administered medications for this visit.    REVIEW OF SYSTEMS:   Constitutional: Denies fevers, chills or abnormal night sweats Eyes: Denies blurriness of vision, double vision or watery eyes Ears, nose, mouth, throat, and face: Denies mucositis or sore throat Respiratory: Denies cough, dyspnea or wheezes Cardiovascular: Denies palpitation, chest discomfort or lower extremity swelling Skin: Denies abnormal skin rashes Lymphatics: Denies new lymphadenopathy or easy bruising Behavioral/Psych: Mood is stable, no new changes  All other systems were reviewed with the patient and are negative.  PHYSICAL EXAMINATION: ECOG PERFORMANCE STATUS: 2 - Symptomatic, <50% confined to bed  Vitals:   10/28/19 1028  BP: 135/80  Pulse: 75  Resp: 18  Temp: 98.3 F (36.8 C)  SpO2: 99%   Filed Weights    GENERAL:alert, in mild distress, sitting on the wheelchair.  She appears uncomfortable due to nausea.  She looks frail SKIN: skin color, texture, turgor are normal, no rashes or significant lesions EYES: normal, conjunctiva are pink and non-injected, sclera clear OROPHARYNX:no exudate, no erythema and lips, buccal mucosa, and tongue normal  NECK: supple, thyroid normal size, non-tender, without nodularity LYMPH:  no palpable lymphadenopathy in the cervical, axillary or inguinal LUNGS: clear to auscultation and percussion with normal breathing effort HEART: regular rate & rhythm and no murmurs and no lower extremity edema ABDOMEN:abdomen soft, non-tender and normal bowel sounds Musculoskeletal:no cyanosis of digits and no clubbing  PSYCH: alert & oriented x 3 with fluent speech NEURO: no focal motor/sensory  deficits  LABORATORY DATA:  I have reviewed the data as listed Lab Results  Component Value Date   WBC 6.8 10/13/2019   HGB 14.5 10/13/2019   HCT 41.3 10/13/2019   MCV 89.6 10/13/2019   PLT 235 10/13/2019   Recent Labs    03/03/19 0934 09/29/19 1240  NA 139 140  K 3.5 3.1*  CL 98 103  CO2 27 26  GLUCOSE 115* 148*  BUN 25 7*  CREATININE 0.91 0.84  CALCIUM 9.6 9.5  GFRNONAA 64 >60  GFRAA 74 >60  PROT 8.0  --   ALBUMIN 4.7  --   AST 13  --  ALT 13  --   ALKPHOS 100  --   BILITOT 0.5  --     RADIOGRAPHIC STUDIES: I have personally reviewed the radiological images as listed and agreed with the findings in the report. NM PET Image Restag (PS) Skull Base To Thigh  Result Date: 10/27/2019 CLINICAL DATA:  Initial treatment strategy for recurrent endometrial carcinoma diagnosed on left para-aortic mass biopsy from 10/13/2019. EXAM: NUCLEAR MEDICINE PET SKULL BASE TO THIGH TECHNIQUE: 7.6 mCi F-18 FDG was injected intravenously. Full-ring PET imaging was performed from the skull base to thigh after the radiotracer. CT data was obtained and used for attenuation correction and anatomic localization. Fasting blood glucose: 137 mg/dl COMPARISON:  09/22/2019 CT abdomen/pelvis. FINDINGS: Mediastinal blood pool activity: SUV max 2.9 Liver activity: SUV max NA NECK: No hypermetabolic lymph nodes in the neck. Incidental CT findings: Hypodense 1.0 cm right thyroid nodule, non hypermetabolic, requiring no follow-up. CHEST: No enlarged or hypermetabolic axillary, mediastinal or hilar lymph nodes. No hypermetabolic pulmonary findings. Incidental CT findings: Coronary atherosclerosis. Atherosclerotic nonaneurysmal thoracic aorta. No acute consolidative airspace disease or lung masses. Peripheral right upper lobe 2 mm solid pulmonary nodule is below PET resolution (series 9/image 47). No additional significant pulmonary nodules. ABDOMEN/PELVIS: There is a single poorly marginated intensely hypermetabolic  nodal conglomerate in the left para-aortic and left common iliac chains measuring 5.1 x 3.4 x 8.3 cm with max SUV 17.3 (series 4/image 144). No additional enlarged or hypermetabolic lymph nodes in the abdomen or pelvis. No abnormal hypermetabolic activity within the liver, pancreas, adrenal glands, or spleen. Incidental CT findings: Subcentimeter hypodense right liver lesions are too small to characterize and require no follow-up. Mildly dilated common bile duct (7 mm diameter) and borderline mild main pancreatic duct dilation (3 mm diameter), unchanged from recent CT abdomen study. Atherosclerotic nonaneurysmal abdominal aorta. Mild left colonic diverticulosis. Hysterectomy. SKELETON: No focal hypermetabolic activity to suggest skeletal metastasis. Incidental CT findings: none IMPRESSION: 1. Poorly marginated hypermetabolic nodal conglomerate in the left para-aortic and left common iliac chains, compatible with nodal metastatic disease. 2. No additional hypermetabolic sites of metastatic disease. 3. Right upper lobe 2 mm solid pulmonary nodule, below PET resolution, recommend attention on follow-up chest CT in 3-6 months. 4. Chronic findings include: Aortic Atherosclerosis (ICD10-I70.0). Coronary atherosclerosis. Mild left colonic diverticulosis. Mild biliary and pancreatic duct dilation of uncertain etiology, unchanged since 09/22/2019 CT. Electronically Signed   By: Ilona Sorrel M.D.   On: 10/27/2019 10:26   CT Biopsy  Result Date: 10/13/2019 INDICATION: 74 year old female with a history endometrial carcinoma, now with left paraspinal mass EXAM: CT BIOPSY MEDICATIONS: None. ANESTHESIA/SEDATION: Moderate (conscious) sedation was employed during this procedure. A total of Versed 2.0 mg and Fentanyl 100 mcg was administered intravenously. Moderate Sedation Time: 20 minutes. The patient's level of consciousness and vital signs were monitored continuously by radiology nursing throughout the procedure under my  direct supervision. FLUOROSCOPY TIME:  CT COMPLICATIONS: None PROCEDURE: Informed written consent was obtained from the patient after a thorough discussion of the procedural risks, benefits and alternatives. All questions were addressed. Maximal Sterile Barrier Technique was utilized including caps, mask, sterile gowns, sterile gloves, sterile drape, hand hygiene and skin antiseptic. A timeout was performed prior to the initiation of the procedure. Patient position prone position on the CT gantry table. Scout CT was acquired for planning purposes. The patient is prepped and draped in the usual sterile fashion. 1% lidocaine was used for local anesthesia. Using CT guidance, a 20 cm guide needle  was advanced to the left paraspinal mass. Once we confirmed needle tip position, multiple 18 gauge core biopsy were acquired. Specimen placed into fresh specimen. Needle was removed and a final image was stored. Patient tolerated the procedure well and remained hemodynamically stable throughout. No complications were encountered and no significant blood loss. IMPRESSION: Status post CT-guided biopsy of left paraspinal mass. Tissue specimen sent to pathology for complete histopathologic analysis. Signed, Dulcy Fanny. Dellia Nims, RPVI [facility], [place] [facility] Electronically Signed   By: Corrie Mckusick D.O.   On: 10/13/2019 16:05

## 2019-10-29 NOTE — Assessment & Plan Note (Signed)
I briefly discussed the goals of care with the patient but I do not believe she purely across the severity of his situation The patient look fairly unwell with severe nausea and we terminated the discussion prematurely. The goals of care is palliative in nature given recurrence of disease

## 2019-10-29 NOTE — Telephone Encounter (Signed)
Scheduled appt per 1/14 sch message -- pt to get an updated schedule at chemo edu appt

## 2019-10-30 DIAGNOSIS — I700 Atherosclerosis of aorta: Secondary | ICD-10-CM | POA: Insufficient documentation

## 2019-11-02 ENCOUNTER — Ambulatory Visit
Admission: RE | Admit: 2019-11-02 | Discharge: 2019-11-02 | Disposition: A | Discharge status: Home / Self Care | Payer: Medicare Other | Source: Ambulatory Visit | Attending: Radiation Oncology | Admitting: Radiation Oncology

## 2019-11-02 ENCOUNTER — Other Ambulatory Visit: Payer: Medicare Other

## 2019-11-02 ENCOUNTER — Telehealth: Payer: Self-pay | Admitting: Oncology

## 2019-11-02 DIAGNOSIS — Z510 Encounter for antineoplastic radiation therapy: Secondary | ICD-10-CM | POA: Insufficient documentation

## 2019-11-02 DIAGNOSIS — C541 Malignant neoplasm of endometrium: Secondary | ICD-10-CM | POA: Insufficient documentation

## 2019-11-02 NOTE — Telephone Encounter (Signed)
Stephanie [name] called and said her son is having back spasms and is not able to drive her to her appointments today.  She is not able to use the transportation program because she is legally blind.    Rescheduled CT SIM apt for 1/20 at 9 am, labs at 11:30 and patient education at 12.  She verbalized agreement of appointment date and times.

## 2019-11-02 NOTE — Progress Notes (Signed)
  Radiation Oncology         [phone] ________________________________  Name: Stephanie [name] MRN: [id number]  Date: 11/04/2019  DOB: 27-Aug-1946  SIMULATION AND TREATMENT PLANNING NOTE    ICD-10-CM   1. Endometrial cancer ([place])  C54.1     DIAGNOSIS: Recurrent stage IA grade 2 endometrial carcinoma  NARRATIVE:  The patient was brought to the [facility].  Identity was confirmed.  All relevant records and images related to the planned course of therapy were reviewed.  The patient freely provided informed written consent to proceed with treatment after reviewing the details related to the planned course of therapy. The consent form was witnessed and verified by the simulation staff.  Then, the patient was set-up in a stable reproducible  supine position for radiation therapy.  CT images were obtained.  Surface markings were placed.  The CT images were loaded into the planning software.  Then the target and avoidance structures were contoured.  Treatment planning then occurred.  The radiation prescription was entered and confirmed.  Then, I designed and supervised the construction of a total of 4 medically necessary complex treatment devices.  I have requested : Intensity Modulated Radiotherapy (IMRT) is medically necessary for this case for the following reason:  Small bowel and kidney sparing..  I have ordered: dose calc.  PLAN:  The patient will receive 54 Gy in 30 fractions.  -----------------------------------  Blair Promise, PhD, MD  This document serves as a record of services personally performed by Gery Pray, MD. It was created on his behalf by Clerance Lav, a trained medical scribe. The creation of this record is based on the scribe's personal observations and the provider's statements to them. This document has been checked and approved by the attending provider.

## 2019-11-04 ENCOUNTER — Other Ambulatory Visit: Payer: Self-pay

## 2019-11-04 ENCOUNTER — Inpatient Hospital Stay: Payer: Medicare Other

## 2019-11-04 ENCOUNTER — Encounter: Payer: Self-pay | Admitting: Hematology and Oncology

## 2019-11-04 ENCOUNTER — Ambulatory Visit
Admission: RE | Admit: 2019-11-04 | Discharge: 2019-11-04 | Disposition: A | Discharge status: Home / Self Care | Payer: Medicare Other | Source: Ambulatory Visit | Attending: Radiation Oncology | Admitting: Radiation Oncology

## 2019-11-04 DIAGNOSIS — C541 Malignant neoplasm of endometrium: Secondary | ICD-10-CM | POA: Diagnosis not present

## 2019-11-04 DIAGNOSIS — Z7189 Other specified counseling: Secondary | ICD-10-CM

## 2019-11-04 DIAGNOSIS — Z5112 Encounter for antineoplastic immunotherapy: Secondary | ICD-10-CM | POA: Diagnosis not present

## 2019-11-04 DIAGNOSIS — Z510 Encounter for antineoplastic radiation therapy: Secondary | ICD-10-CM | POA: Diagnosis not present

## 2019-11-04 LAB — CMP (CANCER CENTER ONLY)
ALT: 9 U/L (ref 0–44)
AST: 12 U/L — ABNORMAL LOW (ref 15–41)
Albumin: 4.5 g/dL (ref 3.5–5.0)
Alkaline Phosphatase: 94 U/L (ref 38–126)
Anion gap: 11 (ref 5–15)
BUN: 12 mg/dL (ref 8–23)
CO2: 27 mmol/L (ref 22–32)
Calcium: 9.4 mg/dL (ref 8.9–10.3)
Chloride: 104 mmol/L (ref 98–111)
Creatinine: 0.83 mg/dL (ref 0.44–1.00)
GFR, Est AFR Am: >60 mL/min (ref >60)
GFR, Estimated: >60 mL/min (ref >60)
Glucose, Bld: 128 mg/dL — ABNORMAL HIGH (ref 70–99)
Potassium: 3.5 mmol/L (ref 3.5–5.1)
Sodium: 142 mmol/L (ref 135–145)
Total Bilirubin: 0.8 mg/dL (ref 0.3–1.2)
Total Protein: 8.3 g/dL — ABNORMAL HIGH (ref 6.5–8.1)

## 2019-11-04 LAB — CBC WITH DIFFERENTIAL (CANCER CENTER ONLY)
Abs Immature Granulocytes: 0.02 10*3/uL (ref 0.00–0.07)
Basophils Absolute: 0 10*3/uL (ref 0.0–0.1)
Basophils Relative: 0 %
Eosinophils Absolute: 0.1 10*3/uL (ref 0.0–0.5)
Eosinophils Relative: 2 %
HCT: 40.4 % (ref 36.0–46.0)
Hemoglobin: 14.2 g/dL (ref 12.0–15.0)
Immature Granulocytes: 0 %
Lymphocytes Relative: 21 %
Lymphs Abs: 1.7 10*3/uL (ref 0.7–4.0)
MCH: 31.1 pg (ref 26.0–34.0)
MCHC: 35.1 g/dL (ref 30.0–36.0)
MCV: 88.4 fL (ref 80.0–100.0)
Monocytes Absolute: 0.6 10*3/uL (ref 0.1–1.0)
Monocytes Relative: 7 %
Neutro Abs: 5.7 10*3/uL (ref 1.7–7.7)
Neutrophils Relative %: 70 %
Platelet Count: 213 10*3/uL (ref 150–400)
RBC: 4.57 MIL/uL (ref 3.87–5.11)
RDW: 13.1 % (ref 11.5–15.5)
WBC Count: 8.1 10*3/uL (ref 4.0–10.5)
nRBC: 0 % (ref 0.0–0.2)

## 2019-11-04 LAB — GENETIC SCREENING ORDER

## 2019-11-04 LAB — TSH: TSH: 0.555 u[IU]/mL (ref 0.308–3.960)

## 2019-11-04 NOTE — Progress Notes (Signed)
Met with patient at registration to introduce myself as Financial Resource Specialist and to offer available resources.  Discussed one-time $1000 [employer] grant and qualifications to assist with personal expenses while going through treatment.  Gave her my card if interested in applying and for any additional financial questions or concerns. 

## 2019-11-05 ENCOUNTER — Other Ambulatory Visit: Payer: Self-pay | Admitting: Hematology and Oncology

## 2019-11-05 ENCOUNTER — Telehealth: Payer: Self-pay | Admitting: Oncology

## 2019-11-05 MED ORDER — ONDANSETRON HCL 8 MG PO TABS: 8.0000 mg | ORAL_TABLET | Freq: Three times a day (TID) | ORAL | 3 refills | Status: DC | PRN

## 2019-11-05 MED ORDER — PROCHLORPERAZINE MALEATE 10 MG PO TABS: 10.0000 mg | ORAL_TABLET | Freq: Four times a day (QID) | ORAL | 0 refills | Status: DC | PRN

## 2019-11-05 NOTE — Telephone Encounter (Signed)
I suggest  1) make sure she is not constipated 2) Stop scopolamine 3) try zofran for nausea 4) if zofran not helpful try compazine

## 2019-11-05 NOTE — Telephone Encounter (Signed)
Stephanie [name] said she is not constipated.  She is taking Miralax daily.  She will stop using the scopolamine patch.  Discussed how to take Zofran and if that does not work, than try compazine.  Advised her to call back if it does not help.

## 2019-11-05 NOTE — Telephone Encounter (Signed)
Notified by Abigail Butts, RN with patient education that patient is having trouble with nausea when taking hydrocodone.    Called Stephanie [name] and she said her pain is in the base of her back and it hurts when she lays down or moves.  She rates the pain at an 8/10. She said hydrocodone helps the pain but it causes so much nausea that she has been taking 2 500 mg extra strength tylenol and 1 200 mg Advil every 6 hours instead.  She has tried using a scopolamine patch but it makes her feel off balance.  She is wondering if there is anything else she can take for nausea that she could take with the hydrocodone.  She uses the [facility] on [address].

## 2019-11-09 ENCOUNTER — Telehealth: Payer: Self-pay

## 2019-11-09 NOTE — Telephone Encounter (Signed)
Pt called to convey that [facility] contacted pt yesterday to report that expedited auth had been approved. Conveyed to pt that this RN would relay message to financial rep and Dr. Sondra Come. Pt verbalized understanding and agreement. Loma Sousa, RN BSN

## 2019-11-09 NOTE — Progress Notes (Signed)
  Radiation Oncology         [phone] ________________________________  Name: QUIANA BURCHFIELD MRN: [id number]  Date: 11/10/2019  DOB: 15-Jun-1946  Simulation Verification Note    ICD-10-CM   1. Endometrial cancer ([place])  C54.1     Status: outpatient   NARRATIVE: The patient was brought to the treatment unit and placed in the planned treatment position. The clinical setup was verified. Then port films were obtained and uploaded to the radiation oncology medical record software. The treatment beams were carefully compared against the planned radiation fields. The position location and shape of the radiation fields was reviewed. They targeted volume of tissue appears to be appropriately covered by the radiation beams. Organs at risk appear to be excluded as planned.  Based on my personal review, I approved the simulation verification. The patient's treatment will proceed as planned.   -----------------------------------  Blair Promise, PhD, MD  This document serves as a record of services personally performed by Gery Pray, MD. It was created on his behalf by Clerance Lav, a trained medical scribe. The creation of this record is based on the scribe's personal observations and the provider's statements to them. This document has been checked and approved by the attending provider.

## 2019-11-10 ENCOUNTER — Encounter: Payer: Self-pay | Admitting: Hematology and Oncology

## 2019-11-10 ENCOUNTER — Other Ambulatory Visit: Payer: Medicare Other

## 2019-11-10 ENCOUNTER — Other Ambulatory Visit: Payer: Self-pay

## 2019-11-10 ENCOUNTER — Ambulatory Visit
Admission: RE | Admit: 2019-11-10 | Discharge: 2019-11-10 | Disposition: A | Discharge status: Home / Self Care | Payer: Medicare Other | Source: Ambulatory Visit | Attending: Radiation Oncology | Admitting: Radiation Oncology

## 2019-11-10 ENCOUNTER — Inpatient Hospital Stay: Payer: Medicare Other

## 2019-11-10 ENCOUNTER — Inpatient Hospital Stay (HOSPITAL_BASED_OUTPATIENT_CLINIC_OR_DEPARTMENT_OTHER): Payer: Medicare Other | Admitting: Hematology and Oncology

## 2019-11-10 VITALS — BP 137/76 | HR 76

## 2019-11-10 DIAGNOSIS — G893 Neoplasm related pain (acute) (chronic): Secondary | ICD-10-CM

## 2019-11-10 DIAGNOSIS — K5909 Other constipation: Secondary | ICD-10-CM

## 2019-11-10 DIAGNOSIS — R110 Nausea: Secondary | ICD-10-CM

## 2019-11-10 DIAGNOSIS — Z5112 Encounter for antineoplastic immunotherapy: Secondary | ICD-10-CM | POA: Diagnosis not present

## 2019-11-10 DIAGNOSIS — Z7189 Other specified counseling: Secondary | ICD-10-CM

## 2019-11-10 DIAGNOSIS — Z510 Encounter for antineoplastic radiation therapy: Secondary | ICD-10-CM | POA: Diagnosis not present

## 2019-11-10 DIAGNOSIS — C541 Malignant neoplasm of endometrium: Secondary | ICD-10-CM

## 2019-11-10 MED ORDER — SODIUM CHLORIDE 0.9 % IV SOLN
Freq: Once | INTRAVENOUS | Status: AC
  Filled 2019-11-10: qty 250

## 2019-11-10 MED ORDER — SODIUM CHLORIDE 0.9 % IV SOLN
200.0000 mg | Freq: Once | INTRAVENOUS | Status: AC
  Administered 2019-11-10: 200 mg via INTRAVENOUS
  Filled 2019-11-10: qty 8

## 2019-11-10 NOTE — Assessment & Plan Note (Signed)
She is ready to begin Beryle Flock We discussed the risk and benefits of treatment and she agreed to proceed I will see her every 3 weeks for further follow-up

## 2019-11-10 NOTE — Assessment & Plan Note (Signed)
She has self discontinued hydrocodone due to poor tolerance She will continue over-the-counter analgesics

## 2019-11-10 NOTE — Patient Instructions (Signed)
[facility] Discharge Instructions for Patients Receiving Chemotherapy  Today you received the following chemotherapy agents keytruda.  To help prevent nausea and vomiting after your treatment, we encourage you to take your nausea medication    If you develop nausea and vomiting that is not controlled by your nausea medication, call the clinic.   BELOW ARE SYMPTOMS THAT SHOULD BE REPORTED IMMEDIATELY:  *FEVER GREATER THAN 100.5 F  *CHILLS WITH OR WITHOUT FEVER  NAUSEA AND VOMITING THAT IS NOT CONTROLLED WITH YOUR NAUSEA MEDICATION  *UNUSUAL SHORTNESS OF BREATH  *UNUSUAL BRUISING OR BLEEDING  TENDERNESS IN MOUTH AND THROAT WITH OR WITHOUT PRESENCE OF ULCERS  *URINARY PROBLEMS  *BOWEL PROBLEMS  UNUSUAL RASH Items with * indicate a potential emergency and should be followed up as soon as possible.  Feel free to call the clinic should you have any questions or concerns. The clinic phone number is [phone].  Please show the [place] at check-in to the Emergency Department and triage nurse.  Pembrolizumab injection What is this medicine? PEMBROLIZUMAB (pem broe liz ue mab) is a monoclonal antibody. It is used to treat certain types of cancer. This medicine may be used for other purposes; ask your health care provider or pharmacist if you have questions. COMMON BRAND NAME(S): Keytruda What should I tell my health care provider before I take this medicine? They need to know if you have any of these conditions:  diabetes  immune system problems  inflammatory bowel disease  liver disease  lung or breathing disease  lupus  received or scheduled to receive an organ transplant or a stem-cell transplant that uses donor stem cells  an unusual or allergic reaction to pembrolizumab, other medicines, foods, dyes, or preservatives  pregnant or trying to get pregnant  breast-feeding How should I use this medicine? This medicine is for infusion  into a vein. It is given by a health care professional in a hospital or clinic setting. A special MedGuide will be given to you before each treatment. Be sure to read this information carefully each time. Talk to your pediatrician regarding the use of this medicine in children. While this drug may be prescribed for children as young as 6 months for selected conditions, precautions do apply. Overdosage: If you think you have taken too much of this medicine contact a poison control center or emergency room at once. NOTE: This medicine is only for you. Do not share this medicine with others. What if I miss a dose? It is important not to miss your dose. Call your doctor or health care professional if you are unable to keep an appointment. What may interact with this medicine? Interactions have not been studied. Give your health care provider a list of all the medicines, herbs, non-prescription drugs, or dietary supplements you use. Also tell them if you smoke, drink alcohol, or use illegal drugs. Some items may interact with your medicine. This list may not describe all possible interactions. Give your health care provider a list of all the medicines, herbs, non-prescription drugs, or dietary supplements you use. Also tell them if you smoke, drink alcohol, or use illegal drugs. Some items may interact with your medicine. What should I watch for while using this medicine? Your condition will be monitored carefully while you are receiving this medicine. You may need blood work done while you are taking this medicine. Do not become pregnant while taking this medicine or for 4 months after stopping it. Women should inform  their doctor if they wish to become pregnant or think they might be pregnant. There is a potential for serious side effects to an unborn child. Talk to your health care professional or pharmacist for more information. Do not breast-feed an infant while taking this medicine or for 4 months  after the last dose. What side effects may I notice from receiving this medicine? Side effects that you should report to your doctor or health care professional as soon as possible:  allergic reactions like skin rash, itching or hives, swelling of the face, lips, or tongue  bloody or black, tarry  breathing problems  changes in vision  chest pain  chills  confusion  constipation  cough  diarrhea  dizziness or feeling faint or lightheaded  fast or irregular heartbeat  fever  flushing  joint pain  low blood counts - this medicine may decrease the number of white blood cells, red blood cells and platelets. You may be at increased risk for infections and bleeding.  muscle pain  muscle weakness  pain, tingling, numbness in the hands or feet  persistent headache  redness, blistering, peeling or loosening of the skin, including inside the mouth  signs and symptoms of high blood sugar such as dizziness; dry mouth; dry skin; fruity breath; nausea; stomach pain; increased hunger or thirst; increased urination  signs and symptoms of kidney injury like trouble passing urine or change in the amount of urine  signs and symptoms of liver injury like dark urine, light-colored stools, loss of appetite, nausea, right upper belly pain, yellowing of the eyes or skin  sweating  swollen lymph nodes  weight loss Side effects that usually do not require medical attention (report to your doctor or health care professional if they continue or are bothersome):  decreased appetite  hair loss  muscle pain  tiredness This list may not describe all possible side effects. Call your doctor for medical advice about side effects. You may report side effects to FDA at 1-800-FDA-1088. Where should I keep my medicine? This drug is given in a hospital or clinic and will not be stored at home. NOTE: This sheet is a summary. It may not cover all possible information. If you have questions  about this medicine, talk to your doctor, pharmacist, or health care provider.  2020 Elsevier/Gold Standard (2019-08-07 18:07:58)

## 2019-11-10 NOTE — Assessment & Plan Note (Signed)
This has resolved She has antiemetics to take as needed

## 2019-11-10 NOTE — Progress Notes (Signed)
First time [place]. Pt. Tolerated well. Questions answered. Futures trader given.

## 2019-11-10 NOTE — Progress Notes (Signed)
[facility] OFFICE PROGRESS NOTE  Patient Care Team: Buzzy Han, MD as PCP - General (Family Medicine)  ASSESSMENT & PLAN:  Endometrial cancer [facility]) She is ready to begin Beryle Flock We discussed the risk and benefits of treatment and she agreed to proceed I will see her every 3 weeks for further follow-up  Cancer associated pain She has self discontinued hydrocodone due to poor tolerance She will continue over-the-counter analgesics  Nausea without vomiting This has resolved She has antiemetics to take as needed  Other constipation This has resolved She will continue laxatives  Goals of care, counseling/discussion The goals of care is palliative in nature given recurrence of disease However, she is currently being treated aggressively with combination of immunotherapy and radiation    No orders of the defined types were placed in this encounter.   All questions were answered. The patient knows to call the clinic with any problems, questions or concerns. The total time spent in the appointment was 20 minutes encounter with patients including review of chart and various tests results, discussions about plan of care and coordination of care plan   [name], MD 11/10/2019 12:38 PM  INTERVAL HISTORY: Please see below for problem oriented charting. She returns with her son for further follow-up She looks stronger today She have self discontinue narcotic prescription and since then, her nausea and vomiting has resolved Her bowel habits are regular with laxatives as needed She is ready to start treatment  SUMMARY OF ONCOLOGIC HISTORY: Oncology History Overview Note  MMR - Abnormal   Endometrial cancer ([place])  06/01/2013 Initial Diagnosis   Endometrial cancer   06/01/2013 Initial Biopsy   EMB for AUB Endometrium, biopsy - POSITIVE FOR ENDOMETRIAL ADENOCARCINOMA, SEE COMMENT. Microscopic Comment As sampled the endometrial adenocarcinoma appears  to be endometrioid type, FIGO Grade I.   07/21/2013 Surgery   PREOPERATIVE DIAGNOSIS: Grade 1 endometrial carcinoma. Uterine fibroids    PROCEDURE: Total abdominal hysterectomy, bilateral salpingo-oophorectomy, pelvic lymphadenectomy   SURGEON: [name], M.D  SURGICAL FINDINGS: At the time of exploratory laparotomy the uterus was approximately 16-18 weeks size with multiple subserosal fibroids. Tubes and ovaries were normal. On frozen section patient had endometrial carcinoma invading only the inner half of the myometrium. Exploration the upper abdomen revealed no evidence of metastatic disease. There was no pelvic or para-aortic lymphadenopathy.     07/21/2013 Pathologic Stage   1. Uterus +/- tubes/ovaries, neoplastic - INVASIVE ENDOMETRIOID CARCINOMA, FIGO GRADE II, WITH SQUAMOUS DIFFERENTIATION, CONFINED WITHIN INNER HALF OF THE MYOMETRIUM. - LEIOMYOMATA AND ADENOMYOSIS. - CERVIX: BENIGN SQUAMOUS MUCOSA AND ENDOCERVICAL MUCOSA, NO DYSPLASIA OR MALIGNANCY. - BILATERAL OVARIES AND FALLOPIAN TUBES: NO PATHOLOGIC ABNORMALITIES. 2. Lymph nodes, regional resection, left pelvic - FOUR LYMPH NODES, NO EVIDENCE OF METASTATIC CARCINOMA (0/4). 3. Lymph nodes, regional resection, right pelvic - THREE LYMPH NODES, NEGATIVE FOR METASTATIC CARCINOMA (0/3). Microscopic Comment 1. ONCOLOGY TABLE - UTERUS, CARCINOMA OR CARCINOSARCOMA Specimen: Uterus, cervix, bilateral ovaries and fallopian tubes Procedure: Total hysterectomy and bilateral salpingo-oophorectomy Lymph node sampling performed: Yes Specimen integrity: Intact Maximum tumor size (cm): 2.4 cm, gross measurement Histologic type: Invasive endometrioid carcinoma with squamous differentiation Grade: FIGO II Myometrial invasion: 1.1 cm where myometrium is 3.7 cm in thickness Cervical stromal involvement: Not identified Extent of involvement of other organs: No Lymph vascular invasion: Not identified Peritoneal washings: Negative  (Please correlate with [id number]) Lymph nodes: number examined 7; number positive 0 Pelvic lymph nodes: 0 involved of 7 lymph nodes Para-aortic lymph nodes: N/A TNM  code: pT1a, pN0 FIGO Stage (based on pathologic findings, needs clinical correlation): IA   09/22/2019 Imaging   CT A/P: There is a left paraspinous mass which is poorly defined and low-density measuring 3 x 2.2 by 5.5 cm. Deviates the left ureter laterally. No additional periaortic masses are identified.   10/13/2019 Pathology Results   A. LYMPH NODE, LEFT PARASPINAL, NEEDLE CORE BIOPSY: - Poorly differentiated carcinoma. COMMENT: Poorly differentiated carcinoma is present in the background of a majority of necrosis and fibrosis tissue. No distinct nodal tissue is identified. Immunohistochemistry for CK7, PAX 8 and ER is positive. CK20, TTF-1, CDX-2 and GATA-3 are negative. The immunophenotype is compatible with the provided clinical history of gynecologic cancer.    Genetic Testing   Patient has genetic testing done for MMR. Results revealed patient has the following mutation(s): MMR - Abnormal   10/27/2019 PET scan   1. Poorly marginated hypermetabolic nodal conglomerate in the left para-aortic and left common iliac chains, compatible with nodal metastatic disease. 2. No additional hypermetabolic sites of metastatic disease. 3. Right upper lobe 2 mm solid pulmonary nodule, below PET resolution, recommend attention on follow-up chest CT in 3-6 months. 4. Chronic findings include: Aortic Atherosclerosis (ICD10-I70.0). Coronary atherosclerosis. Mild left colonic diverticulosis. Mild biliary and pancreatic duct dilation of uncertain etiology, unchanged since 09/22/2019 CT.   11/10/2019 -  Chemotherapy   The patient had pembrolizumab (KEYTRUDA) 200 mg in sodium chloride 0.9 % 50 mL chemo infusion, 200 mg, Intravenous, Once, 1 of 6 cycles  for chemotherapy treatment.      REVIEW OF SYSTEMS:   Constitutional: Denies fevers,  chills or abnormal weight loss Eyes: Denies blurriness of vision Ears, nose, mouth, throat, and face: Denies mucositis or sore throat Respiratory: Denies cough, dyspnea or wheezes Cardiovascular: Denies palpitation, chest discomfort or lower extremity swelling Gastrointestinal:  Denies nausea, heartburn or change in bowel habits Skin: Denies abnormal skin rashes Lymphatics: Denies new lymphadenopathy or easy bruising Neurological:Denies numbness, tingling or new weaknesses Behavioral/Psych: Mood is stable, no new changes  All other systems were reviewed with the patient and are negative.  I have reviewed the past medical history, past surgical history, social history and family history with the patient and they are unchanged from previous note.  ALLERGIES:  is allergic to iron; iron dextran; penicillins; amoxicillin; ampicillin; codeine; diamox [acetazolamide]; penicillin g; and other.  MEDICATIONS:  Current Outpatient Medications  Medication Sig Dispense Refill  . amLODipine (NORVASC) 10 MG tablet Take by mouth.    Marland Kitchen atorvastatin (LIPITOR) 20 MG tablet Take 1 tablet (20 mg total) by mouth daily. 30 tablet 3  . brimonidine (ALPHAGAN) 0.2 % ophthalmic solution     . busPIRone (BUSPAR) 7.5 MG tablet Take 7.5 mg by mouth 2 (two) times daily.    . dorzolamide-timolol (COSOPT) 22.3-6.8 MG/ML ophthalmic solution Place 1 drop into the left eye 2 (two) times daily.    . hydrochlorothiazide (HYDRODIURIL) 25 MG tablet Take 1 tablet (25 mg total) by mouth daily. Discontinue amlodipine (Patient not taking: Reported on 03/11/2019) 90 tablet 1  . lisinopril (ZESTRIL) 40 MG tablet Take 1 tablet by mouth once daily 90 tablet 0  . magnesium 30 MG tablet Take by mouth daily.    . metoprolol tartrate (LOPRESSOR) 25 MG tablet Take 1 tablet (25 mg total) by mouth 2 (two) times daily. 180 tablet 1  . Netarsudil Dimesylate 0.02 % SOLN Apply to eye.    . ondansetron (ZOFRAN) 8 MG tablet Take 1 tablet (8 mg  total) by mouth every 8 (eight) hours as needed for nausea. 30 tablet 3  . prochlorperazine (COMPAZINE) 10 MG tablet Take 1 tablet (10 mg total) by mouth every 6 (six) hours as needed for nausea or vomiting. 30 tablet 0  . VYZULTA 0.024 % SOLN      No current facility-administered medications for this visit.   Facility-Administered Medications Ordered in Other Visits  Medication Dose Route Frequency Provider Last Rate Last Admin  . pembrolizumab (KEYTRUDA) 200 mg in sodium chloride 0.9 % 50 mL chemo infusion  200 mg Intravenous Once [name], MD 116 mL/hr at 11/10/19 1212 200 mg at 11/10/19 1212    PHYSICAL EXAMINATION: ECOG PERFORMANCE STATUS: 2 - Symptomatic, <50% confined to bed  Vitals:   11/10/19 1028 11/10/19 1033  BP: (!) 162/97 (!) 162/97  Pulse: 85 85  Resp: 17 17  Temp: 97.8 F (36.6 C) 97.8 F (36.6 C)  SpO2: 99% 99%   Filed Weights   11/10/19 1028 11/10/19 1033  Weight: 151 lb (68.5 kg) 151 lb (68.5 kg)    GENERAL:alert, no distress and comfortable SKIN: skin color, texture, turgor are normal, no rashes or significant lesions EYES: normal, Conjunctiva are pink and non-injected, sclera clear OROPHARYNX:no exudate, no erythema and lips, buccal mucosa, and tongue normal  NECK: supple, thyroid normal size, non-tender, without nodularity LYMPH:  no palpable lymphadenopathy in the cervical, axillary or inguinal LUNGS: clear to auscultation and percussion with normal breathing effort HEART: regular rate & rhythm and no murmurs and no lower extremity edema ABDOMEN:abdomen soft, non-tender and normal bowel sounds Musculoskeletal:no cyanosis of digits and no clubbing  NEURO: alert & oriented x 3 with fluent speech, no focal motor/sensory deficits  LABORATORY DATA:  I have reviewed the data as listed    Component Value Date/Time   NA 142 11/04/2019 1000   NA 139 03/03/2019 0934   NA 142 07/09/2013 1144   K 3.5 11/04/2019 1000   K 3.7 07/09/2013 1144   CL 104  11/04/2019 1000   CO2 27 11/04/2019 1000   CO2 23 07/09/2013 1144   GLUCOSE 128 (H) 11/04/2019 1000   GLUCOSE 115 07/09/2013 1144   BUN 12 11/04/2019 1000   BUN 25 03/03/2019 0934   BUN 14.4 07/09/2013 1144   CREATININE 0.83 11/04/2019 1000   CREATININE 0.82 01/02/2017 1159   CREATININE 0.8 07/09/2013 1144   CALCIUM 9.4 11/04/2019 1000   CALCIUM 9.3 07/09/2013 1144   PROT 8.3 (H) 11/04/2019 1000   PROT 8.0 03/03/2019 0934   PROT 7.8 07/09/2013 1144   ALBUMIN 4.5 11/04/2019 1000   ALBUMIN 4.7 03/03/2019 0934   ALBUMIN 3.7 07/09/2013 1144   AST 12 (L) 11/04/2019 1000   AST 9 07/09/2013 1144   ALT 9 11/04/2019 1000   ALT <6 07/09/2013 1144   ALKPHOS 94 11/04/2019 1000   ALKPHOS 58 07/09/2013 1144   BILITOT 0.8 11/04/2019 1000   BILITOT 0.40 07/09/2013 1144   GFRNONAA >60 11/04/2019 1000   GFRNONAA 74 02/01/2015 1238   GFRAA >60 11/04/2019 1000   GFRAA 85 02/01/2015 1238    No results found for: SPEP, UPEP  Lab Results  Component Value Date   WBC 8.1 11/04/2019   NEUTROABS 5.7 11/04/2019   HGB 14.2 11/04/2019   HCT 40.4 11/04/2019   MCV 88.4 11/04/2019   PLT 213 11/04/2019      Chemistry      Component Value Date/Time   NA 142 11/04/2019 1000   NA 139  03/03/2019 0934   NA 142 07/09/2013 1144   K 3.5 11/04/2019 1000   K 3.7 07/09/2013 1144   CL 104 11/04/2019 1000   CO2 27 11/04/2019 1000   CO2 23 07/09/2013 1144   BUN 12 11/04/2019 1000   BUN 25 03/03/2019 0934   BUN 14.4 07/09/2013 1144   CREATININE 0.83 11/04/2019 1000   CREATININE 0.82 01/02/2017 1159   CREATININE 0.8 07/09/2013 1144      Component Value Date/Time   CALCIUM 9.4 11/04/2019 1000   CALCIUM 9.3 07/09/2013 1144   ALKPHOS 94 11/04/2019 1000   ALKPHOS 58 07/09/2013 1144   AST 12 (L) 11/04/2019 1000   AST 9 07/09/2013 1144   ALT 9 11/04/2019 1000   ALT <6 07/09/2013 1144   BILITOT 0.8 11/04/2019 1000   BILITOT 0.40 07/09/2013 1144       RADIOGRAPHIC STUDIES: I have personally  reviewed the radiological images as listed and agreed with the findings in the report. NM PET Image Restag (PS) Skull Base To Thigh  Result Date: 10/27/2019 CLINICAL DATA:  Initial treatment strategy for recurrent endometrial carcinoma diagnosed on left para-aortic mass biopsy from 10/13/2019. EXAM: NUCLEAR MEDICINE PET SKULL BASE TO THIGH TECHNIQUE: 7.6 mCi F-18 FDG was injected intravenously. Full-ring PET imaging was performed from the skull base to thigh after the radiotracer. CT data was obtained and used for attenuation correction and anatomic localization. Fasting blood glucose: 137 mg/dl COMPARISON:  09/22/2019 CT abdomen/pelvis. FINDINGS: Mediastinal blood pool activity: SUV max 2.9 Liver activity: SUV max NA NECK: No hypermetabolic lymph nodes in the neck. Incidental CT findings: Hypodense 1.0 cm right thyroid nodule, non hypermetabolic, requiring no follow-up. CHEST: No enlarged or hypermetabolic axillary, mediastinal or hilar lymph nodes. No hypermetabolic pulmonary findings. Incidental CT findings: Coronary atherosclerosis. Atherosclerotic nonaneurysmal thoracic aorta. No acute consolidative airspace disease or lung masses. Peripheral right upper lobe 2 mm solid pulmonary nodule is below PET resolution (series 9/image 47). No additional significant pulmonary nodules. ABDOMEN/PELVIS: There is a single poorly marginated intensely hypermetabolic nodal conglomerate in the left para-aortic and left common iliac chains measuring 5.1 x 3.4 x 8.3 cm with max SUV 17.3 (series 4/image 144). No additional enlarged or hypermetabolic lymph nodes in the abdomen or pelvis. No abnormal hypermetabolic activity within the liver, pancreas, adrenal glands, or spleen. Incidental CT findings: Subcentimeter hypodense right liver lesions are too small to characterize and require no follow-up. Mildly dilated common bile duct (7 mm diameter) and borderline mild main pancreatic duct dilation (3 mm diameter), unchanged from  recent CT abdomen study. Atherosclerotic nonaneurysmal abdominal aorta. Mild left colonic diverticulosis. Hysterectomy. SKELETON: No focal hypermetabolic activity to suggest skeletal metastasis. Incidental CT findings: none IMPRESSION: 1. Poorly marginated hypermetabolic nodal conglomerate in the left para-aortic and left common iliac chains, compatible with nodal metastatic disease. 2. No additional hypermetabolic sites of metastatic disease. 3. Right upper lobe 2 mm solid pulmonary nodule, below PET resolution, recommend attention on follow-up chest CT in 3-6 months. 4. Chronic findings include: Aortic Atherosclerosis (ICD10-I70.0). Coronary atherosclerosis. Mild left colonic diverticulosis. Mild biliary and pancreatic duct dilation of uncertain etiology, unchanged since 09/22/2019 CT. Electronically Signed   By: Ilona Sorrel M.D.   On: 10/27/2019 10:26   CT Biopsy  Result Date: 10/13/2019 INDICATION: 74 year old female with a history endometrial carcinoma, now with left paraspinal mass EXAM: CT BIOPSY MEDICATIONS: None. ANESTHESIA/SEDATION: Moderate (conscious) sedation was employed during this procedure. A total of Versed 2.0 mg and Fentanyl 100 mcg was administered intravenously.  Moderate Sedation Time: 20 minutes. The patient's level of consciousness and vital signs were monitored continuously by radiology nursing throughout the procedure under my direct supervision. FLUOROSCOPY TIME:  CT COMPLICATIONS: None PROCEDURE: Informed written consent was obtained from the patient after a thorough discussion of the procedural risks, benefits and alternatives. All questions were addressed. Maximal Sterile Barrier Technique was utilized including caps, mask, sterile gowns, sterile gloves, sterile drape, hand hygiene and skin antiseptic. A timeout was performed prior to the initiation of the procedure. Patient position prone position on the CT gantry table. Scout CT was acquired for planning purposes. The patient is  prepped and draped in the usual sterile fashion. 1% lidocaine was used for local anesthesia. Using CT guidance, a 20 cm guide needle was advanced to the left paraspinal mass. Once we confirmed needle tip position, multiple 18 gauge core biopsy were acquired. Specimen placed into fresh specimen. Needle was removed and a final image was stored. Patient tolerated the procedure well and remained hemodynamically stable throughout. No complications were encountered and no significant blood loss. IMPRESSION: Status post CT-guided biopsy of left paraspinal mass. Tissue specimen sent to pathology for complete histopathologic analysis. Signed, Dulcy Fanny. Dellia Nims, RPVI [facility] Radiology Electronically Signed   By: Corrie Mckusick D.O.   On: 10/13/2019 16:05

## 2019-11-10 NOTE — Assessment & Plan Note (Signed)
This has resolved She will continue laxatives

## 2019-11-10 NOTE — Assessment & Plan Note (Signed)
The goals of care is palliative in nature given recurrence of disease However, she is currently being treated aggressively with combination of immunotherapy and radiation

## 2019-11-11 ENCOUNTER — Other Ambulatory Visit: Payer: Self-pay

## 2019-11-11 ENCOUNTER — Ambulatory Visit
Admission: RE | Admit: 2019-11-11 | Discharge: 2019-11-11 | Disposition: A | Discharge status: Home / Self Care | Payer: Medicare Other | Source: Ambulatory Visit | Attending: Radiation Oncology | Admitting: Radiation Oncology

## 2019-11-11 DIAGNOSIS — Z510 Encounter for antineoplastic radiation therapy: Secondary | ICD-10-CM | POA: Diagnosis not present

## 2019-11-12 ENCOUNTER — Other Ambulatory Visit: Payer: Self-pay

## 2019-11-12 ENCOUNTER — Ambulatory Visit
Admission: RE | Admit: 2019-11-12 | Discharge: 2019-11-12 | Disposition: A | Discharge status: Home / Self Care | Payer: Medicare Other | Source: Ambulatory Visit | Attending: Radiation Oncology | Admitting: Radiation Oncology

## 2019-11-12 DIAGNOSIS — Z510 Encounter for antineoplastic radiation therapy: Secondary | ICD-10-CM | POA: Diagnosis not present

## 2019-11-13 ENCOUNTER — Ambulatory Visit
Admission: RE | Admit: 2019-11-13 | Discharge: 2019-11-13 | Disposition: A | Discharge status: Home / Self Care | Payer: Medicare Other | Source: Ambulatory Visit | Attending: Radiation Oncology | Admitting: Radiation Oncology

## 2019-11-13 ENCOUNTER — Other Ambulatory Visit: Payer: Self-pay

## 2019-11-13 DIAGNOSIS — Z510 Encounter for antineoplastic radiation therapy: Secondary | ICD-10-CM | POA: Diagnosis not present

## 2019-11-16 ENCOUNTER — Other Ambulatory Visit: Payer: Self-pay

## 2019-11-16 ENCOUNTER — Ambulatory Visit
Admission: RE | Admit: 2019-11-16 | Discharge: 2019-11-16 | Disposition: A | Discharge status: Home / Self Care | Payer: Medicare Other | Source: Ambulatory Visit | Attending: Radiation Oncology | Admitting: Radiation Oncology

## 2019-11-16 DIAGNOSIS — Z510 Encounter for antineoplastic radiation therapy: Secondary | ICD-10-CM | POA: Diagnosis present

## 2019-11-16 DIAGNOSIS — C541 Malignant neoplasm of endometrium: Secondary | ICD-10-CM | POA: Insufficient documentation

## 2019-11-17 ENCOUNTER — Other Ambulatory Visit: Payer: Self-pay

## 2019-11-17 ENCOUNTER — Ambulatory Visit
Admission: RE | Admit: 2019-11-17 | Discharge: 2019-11-17 | Disposition: A | Discharge status: Home / Self Care | Payer: Medicare Other | Source: Ambulatory Visit | Attending: Radiation Oncology | Admitting: Radiation Oncology

## 2019-11-17 DIAGNOSIS — Z510 Encounter for antineoplastic radiation therapy: Secondary | ICD-10-CM | POA: Diagnosis not present

## 2019-11-18 ENCOUNTER — Other Ambulatory Visit: Payer: Self-pay

## 2019-11-18 ENCOUNTER — Ambulatory Visit
Admission: RE | Admit: 2019-11-18 | Discharge: 2019-11-18 | Disposition: A | Discharge status: Home / Self Care | Payer: Medicare Other | Source: Ambulatory Visit | Attending: Radiation Oncology | Admitting: Radiation Oncology

## 2019-11-18 DIAGNOSIS — Z510 Encounter for antineoplastic radiation therapy: Secondary | ICD-10-CM | POA: Diagnosis not present

## 2019-11-19 ENCOUNTER — Other Ambulatory Visit: Payer: Self-pay

## 2019-11-19 ENCOUNTER — Ambulatory Visit
Admission: RE | Admit: 2019-11-19 | Discharge: 2019-11-19 | Disposition: A | Discharge status: Home / Self Care | Payer: Medicare Other | Source: Ambulatory Visit | Attending: Radiation Oncology | Admitting: Radiation Oncology

## 2019-11-19 DIAGNOSIS — Z510 Encounter for antineoplastic radiation therapy: Secondary | ICD-10-CM | POA: Diagnosis not present

## 2019-11-20 ENCOUNTER — Other Ambulatory Visit: Payer: Self-pay

## 2019-11-20 ENCOUNTER — Encounter: Payer: Self-pay | Admitting: Hematology and Oncology

## 2019-11-20 ENCOUNTER — Ambulatory Visit
Admission: RE | Admit: 2019-11-20 | Discharge: 2019-11-20 | Disposition: A | Discharge status: Home / Self Care | Payer: Medicare Other | Source: Ambulatory Visit | Attending: Radiation Oncology | Admitting: Radiation Oncology

## 2019-11-20 DIAGNOSIS — Z510 Encounter for antineoplastic radiation therapy: Secondary | ICD-10-CM | POA: Diagnosis not present

## 2019-11-20 NOTE — Progress Notes (Signed)
Met with patient and accompanying person at registration whom brought proof of income for grant.  Patient approved for one-time $1000 [employer] grant to assist with personal expenses while going through treatment. Gave a copy of the approval letter as well as the expense sheet along with the Outpatient pharmacy information. Explained expenses and how they are covered and advised of mailbox for drop off. She received a gas card today from grant.  She has my card for any additional financial questions or concerns.

## 2019-11-23 ENCOUNTER — Telehealth: Payer: Self-pay | Admitting: Licensed Clinical Social Worker

## 2019-11-23 ENCOUNTER — Ambulatory Visit
Admission: RE | Admit: 2019-11-23 | Discharge: 2019-11-23 | Disposition: A | Discharge status: Home / Self Care | Payer: Medicare Other | Source: Ambulatory Visit | Attending: Radiation Oncology | Admitting: Radiation Oncology

## 2019-11-23 ENCOUNTER — Other Ambulatory Visit: Payer: Self-pay

## 2019-11-23 DIAGNOSIS — Z510 Encounter for antineoplastic radiation therapy: Secondary | ICD-10-CM | POA: Diagnosis not present

## 2019-11-23 NOTE — Telephone Encounter (Signed)
Revealed negative genetic testing.  We discussed that we do not know why she has had endometrial cancer or why there is cancer in the family. It could be due to a different gene that we are not testing, or something our current technology cannot pick up.  It will be important for her to keep in contact with genetics to learn if additional testing may be needed in the future.

## 2019-11-24 ENCOUNTER — Other Ambulatory Visit: Payer: Self-pay

## 2019-11-24 ENCOUNTER — Ambulatory Visit: Payer: Self-pay | Admitting: Licensed Clinical Social Worker

## 2019-11-24 ENCOUNTER — Ambulatory Visit
Admission: RE | Admit: 2019-11-24 | Discharge: 2019-11-24 | Disposition: A | Discharge status: Home / Self Care | Payer: Medicare Other | Source: Ambulatory Visit | Attending: Radiation Oncology | Admitting: Radiation Oncology

## 2019-11-24 ENCOUNTER — Encounter: Payer: Self-pay | Admitting: Licensed Clinical Social Worker

## 2019-11-24 DIAGNOSIS — Z801 Family history of malignant neoplasm of trachea, bronchus and lung: Secondary | ICD-10-CM

## 2019-11-24 DIAGNOSIS — C541 Malignant neoplasm of endometrium: Secondary | ICD-10-CM

## 2019-11-24 DIAGNOSIS — Z1379 Encounter for other screening for genetic and chromosomal anomalies: Secondary | ICD-10-CM

## 2019-11-24 DIAGNOSIS — Z800 Family history of malignant neoplasm of digestive organs: Secondary | ICD-10-CM

## 2019-11-24 DIAGNOSIS — Z510 Encounter for antineoplastic radiation therapy: Secondary | ICD-10-CM | POA: Diagnosis not present

## 2019-11-24 HISTORY — DX: Encounter for other screening for genetic and chromosomal anomalies: Z13.79

## 2019-11-24 NOTE — Progress Notes (Signed)
HPI:  [name] was previously seen in the [place] clinic due to a personal history of cancer and concerns regarding a hereditary predisposition to cancer. Please refer to our prior cancer genetics clinic note for more information regarding our discussion, assessment and recommendations, at the time. [name] recent genetic test results were disclosed to her, as were recommendations warranted by these results. These results and recommendations are discussed in more detail below.  In 2014, at the age of 74, [name] was diagnosed with endometrial cancer. This was treated with TAH BSO.  In 2021, at the age of 28, she was diagnosed with a recurrence of this cancer. A biopsy of her lymph node showed abnormal mismatch repair results, with loss of MLH1, MSH6 and PMS2.     CANCER HISTORY:  Oncology History Overview Note  MMR - Abnormal   Endometrial cancer ([place])  06/01/2013 Initial Diagnosis   Endometrial cancer   06/01/2013 Initial Biopsy   EMB for AUB Endometrium, biopsy - POSITIVE FOR ENDOMETRIAL ADENOCARCINOMA, SEE COMMENT. Microscopic Comment As sampled the endometrial adenocarcinoma appears to be endometrioid type, FIGO Grade I.   07/21/2013 Surgery   PREOPERATIVE DIAGNOSIS: Grade 1 endometrial carcinoma. Uterine fibroids    PROCEDURE: Total abdominal hysterectomy, bilateral salpingo-oophorectomy, pelvic lymphadenectomy   SURGEON: [name], M.D  SURGICAL FINDINGS: At the time of exploratory laparotomy the uterus was approximately 16-18 weeks size with multiple subserosal fibroids. Tubes and ovaries were normal. On frozen section patient had endometrial carcinoma invading only the inner half of the myometrium. Exploration the upper abdomen revealed no evidence of metastatic disease. There was no pelvic or para-aortic lymphadenopathy.     07/21/2013 Pathologic Stage   1. Uterus +/- tubes/ovaries, neoplastic - INVASIVE ENDOMETRIOID CARCINOMA, FIGO  GRADE II, WITH SQUAMOUS DIFFERENTIATION, CONFINED WITHIN INNER HALF OF THE MYOMETRIUM. - LEIOMYOMATA AND ADENOMYOSIS. - CERVIX: BENIGN SQUAMOUS MUCOSA AND ENDOCERVICAL MUCOSA, NO DYSPLASIA OR MALIGNANCY. - BILATERAL OVARIES AND FALLOPIAN TUBES: NO PATHOLOGIC ABNORMALITIES. 2. Lymph nodes, regional resection, left pelvic - FOUR LYMPH NODES, NO EVIDENCE OF METASTATIC CARCINOMA (0/4). 3. Lymph nodes, regional resection, right pelvic - THREE LYMPH NODES, NEGATIVE FOR METASTATIC CARCINOMA (0/3). Microscopic Comment 1. ONCOLOGY TABLE - UTERUS, CARCINOMA OR CARCINOSARCOMA Specimen: Uterus, cervix, bilateral ovaries and fallopian tubes Procedure: Total hysterectomy and bilateral salpingo-oophorectomy Lymph node sampling performed: Yes Specimen integrity: Intact Maximum tumor size (cm): 2.4 cm, gross measurement Histologic type: Invasive endometrioid carcinoma with squamous differentiation Grade: FIGO II Myometrial invasion: 1.1 cm where myometrium is 3.7 cm in thickness Cervical stromal involvement: Not identified Extent of involvement of other organs: No Lymph vascular invasion: Not identified Peritoneal washings: Negative (Please correlate with [id number]) Lymph nodes: number examined 7; number positive 0 Pelvic lymph nodes: 0 involved of 7 lymph nodes Para-aortic lymph nodes: N/A TNM code: pT1a, pN0 FIGO Stage (based on pathologic findings, needs clinical correlation): IA   09/22/2019 Imaging   CT A/P: There is a left paraspinous mass which is poorly defined and low-density measuring 3 x 2.2 by 5.5 cm. Deviates the left ureter laterally. No additional periaortic masses are identified.   10/13/2019 Pathology Results   A. LYMPH NODE, LEFT PARASPINAL, NEEDLE CORE BIOPSY: - Poorly differentiated carcinoma. COMMENT: Poorly differentiated carcinoma is present in the background of a majority of necrosis and fibrosis tissue. No distinct nodal tissue is identified. Immunohistochemistry for CK7,  PAX 8 and ER is positive. CK20, TTF-1, CDX-2 and GATA-3 are negative. The immunophenotype is compatible with the provided clinical history  of gynecologic cancer.    Genetic Testing   Patient has genetic testing done for MMR. Results revealed patient has the following mutation(s): MMR - Abnormal   10/27/2019 PET scan   1. Poorly marginated hypermetabolic nodal conglomerate in the left para-aortic and left common iliac chains, compatible with nodal metastatic disease. 2. No additional hypermetabolic sites of metastatic disease. 3. Right upper lobe 2 mm solid pulmonary nodule, below PET resolution, recommend attention on follow-up chest CT in 3-6 months. 4. Chronic findings include: Aortic Atherosclerosis (ICD10-I70.0). Coronary atherosclerosis. Mild left colonic diverticulosis. Mild biliary and pancreatic duct dilation of uncertain etiology, unchanged since 09/22/2019 CT.   11/10/2019 -  Chemotherapy   The patient had pembrolizumab (KEYTRUDA) 200 mg in sodium chloride 0.9 % 50 mL chemo infusion, 200 mg, Intravenous, Once, 1 of 6 cycles Administration: 200 mg (11/10/2019)  for chemotherapy treatment.      FAMILY HISTORY:  We obtained a detailed, 4-generation family history.  Significant diagnoses are listed below: Family History  Problem Relation Age of Onset  . Liver cancer Mother 29  . Hypertension Father   . Vision loss Maternal Grandmother   . Lung cancer Paternal Uncle    [name] has 1 son, 32, no history of cancer. She does not have siblings.  [name] mother had metastatic liver cancer at 1 and died at 45. Patient had 2 maternal uncles, no cancers. No known cancers in maternal cousins. Maternal grandmother passed at 92, grandfather passed in his 40s.  [name] father died at 38, no cancer history. Patient had 3 paternal uncles. One of her uncles had lung cancer and history of smoking. No known cancers in paternal cousins. Paternal grandmother died in a car  accident at 19, grandfather passed in his 42s.  [name] is unaware of previous family history of genetic testing for hereditary cancer risks. Patient's maternal ancestors are from [place] and paternal ancestors are of [place] descent. There is no reported Ashkenazi Jewish ancestry. There is no known consanguinity.   GENETIC TEST RESULTS: Genetic testing reported out on 11/20/2019 through the [facility] CancerNext+RNAinsight cancer panel found no pathogenic mutations.  The CancerNext+RNAinsight gene panel offered by [facility] includes sequencing and rearrangement analysis for the following 36 genes: APC*, ATM*, AXIN2, BARD1, BMPR1A, BRCA1*, BRCA2*, BRIP1*, CDH1*, CDK4, CDKN2A, CHEK2*, DICER1, MLH1*, MSH2*, MSH3, MSH6*, MUTYH*, NBN, NF1*, NTHL1, PALB2*, PMS2*, PTEN*, RAD51C*, RAD51D*, RECQL, SMAD4, SMARCA4, STK11 and TP53* (sequencing and deletion/duplication); HOXB13, POLD1 and POLE (sequencing only); EPCAM and GREM1 (deletion/duplication only). DNA and RNA analyses performed for * genes.   The test report has been scanned into EPIC and is located under the Molecular Pathology section of the Results Review tab.  A portion of the result report is included below for reference.     We discussed with [name] that because current genetic testing is not perfect, it is possible there may be a gene mutation in one of these genes that current testing cannot detect, but that chance is small.  We also discussed, that there could be another gene that has not yet been discovered, or that we have not yet tested, that is responsible for the cancer diagnoses in the family. It is also possible there is a hereditary cause for the cancer in the family that [name] did not inherit and therefore was not identified in her testing.  Therefore, it is important to remain in touch with cancer genetics in the future so that we can continue to offer Ms.  Stephanie [name] the most up to date genetic testing.     ADDITIONAL  GENETIC TESTING: We discussed with [name] that her genetic testing was fairly extensive.  If there are genes identified to increase cancer risk that can be analyzed in the future, we would be happy to discuss and coordinate this testing at that time.  Additionally, we cannot do tumor testing for her to find out more about the abnormal MMR result as her biopsy was on a lymph node.   CANCER SCREENING RECOMMENDATIONS: [name] test result is considered negative (normal).  This means that we have not identified a hereditary cause for her  personal and family history of cancer at this time.Most cancers happen by chance and this negative test suggests that her cancer may fall into this category.    While reassuring, this does not definitively rule out a hereditary predisposition to cancer. It is still possible that there could be genetic mutations that are undetectable by current technology. There could be genetic mutations in genes that have not been tested or identified to increase cancer risk.  Therefore, it is recommended she continue to follow the cancer management and screening guidelines provided by her oncology and primary healthcare provider.   An individual's cancer risk and medical management are not determined by genetic test results alone. Overall cancer risk assessment incorporates additional factors, including personal medical history, family history, and any available genetic information that may result in a personalized plan for cancer prevention and surveillance.  RECOMMENDATIONS FOR FAMILY MEMBERS:  Relatives in this family might be at some increased risk of developing cancer, over the general population risk, simply due to the family history of cancer.  We recommended female relatives in this family have a yearly mammogram beginning at age 82, or 52 years younger than the earliest onset of cancer, an annual clinical breast exam, and perform monthly breast self-exams. Female  relatives in this family should also have a gynecological exam as recommended by their primary provider. All family members should have a colonoscopy by age 69, or as directed by their physicians.  FOLLOW-UP: Lastly, we discussed with [name] that cancer genetics is a rapidly advancing field and it is possible that new genetic tests will be appropriate for her and/or her family members in the future. We encouraged her to remain in contact with cancer genetics on an annual basis so we can update her personal and family histories and let her know of advances in cancer genetics that may benefit this family.   Our contact number was provided. [name] questions were answered to her satisfaction, and she knows she is welcome to call us at anytime with additional questions or concerns.   Faith Rogue, MS, [facility] Genetic Counselor [email or web address] Phone: [phone]

## 2019-11-25 ENCOUNTER — Other Ambulatory Visit: Payer: Self-pay

## 2019-11-25 ENCOUNTER — Ambulatory Visit
Admission: RE | Admit: 2019-11-25 | Discharge: 2019-11-25 | Disposition: A | Discharge status: Home / Self Care | Payer: Medicare Other | Source: Ambulatory Visit | Attending: Radiation Oncology | Admitting: Radiation Oncology

## 2019-11-25 DIAGNOSIS — Z510 Encounter for antineoplastic radiation therapy: Secondary | ICD-10-CM | POA: Diagnosis not present

## 2019-11-26 ENCOUNTER — Other Ambulatory Visit: Payer: Self-pay

## 2019-11-26 ENCOUNTER — Ambulatory Visit
Admission: RE | Admit: 2019-11-26 | Discharge: 2019-11-26 | Disposition: A | Discharge status: Home / Self Care | Payer: Medicare Other | Source: Ambulatory Visit | Attending: Radiation Oncology | Admitting: Radiation Oncology

## 2019-11-26 DIAGNOSIS — Z510 Encounter for antineoplastic radiation therapy: Secondary | ICD-10-CM | POA: Diagnosis not present

## 2019-11-27 ENCOUNTER — Ambulatory Visit
Admission: RE | Admit: 2019-11-27 | Discharge: 2019-11-27 | Disposition: A | Discharge status: Home / Self Care | Payer: Medicare Other | Source: Ambulatory Visit | Attending: Radiation Oncology | Admitting: Radiation Oncology

## 2019-11-27 ENCOUNTER — Ambulatory Visit: Payer: Medicare Other | Attending: Internal Medicine

## 2019-11-27 DIAGNOSIS — Z510 Encounter for antineoplastic radiation therapy: Secondary | ICD-10-CM | POA: Diagnosis not present

## 2019-11-27 DIAGNOSIS — Z23 Encounter for immunization: Secondary | ICD-10-CM

## 2019-11-27 NOTE — Progress Notes (Signed)
   Covid-19 Vaccination Clinic  Name:  Stephanie [name]    MRN: [id number] DOB: 1946/09/27  11/27/2019  Stephanie [name] was observed post Covid-19 immunization for 15 minutes without incidence. She was provided with Vaccine Information Sheet and instruction to access the V-Safe system.   Stephanie [name] was instructed to call 911 with any severe reactions post vaccine: Marland Kitchen Difficulty breathing  . Swelling of your face and throat  . A fast heartbeat  . A bad rash all over your body  . Dizziness and weakness    Immunizations Administered    Name Date Dose VIS Date Route   Pfizer COVID-19 Vaccine 11/27/2019  4:08 PM 0.3 mL 09/25/2019 Intramuscular   Manufacturer: [place]   Lot: [id number]   [place]: [id number]

## 2019-11-30 ENCOUNTER — Telehealth: Payer: Self-pay | Admitting: Oncology

## 2019-11-30 ENCOUNTER — Ambulatory Visit: Payer: Medicare Other

## 2019-11-30 NOTE — Telephone Encounter (Signed)
Stephanie [name] called and confirmed her apponitments for tomorrow.  She also said she does not have electricity and doesn't know if she will be able to keep her radiation appointment today.  She is going to call back closer to her appointment time to let us know if she needs to cancel.

## 2019-11-30 NOTE — Telephone Encounter (Signed)
Reality called back and said her power is not going to be back on until late tonight.  She does need to cancel her radiation appointment.  Notified [facility] of cancellation.

## 2019-12-01 ENCOUNTER — Inpatient Hospital Stay: Payer: Medicare Other | Admitting: Hematology and Oncology

## 2019-12-01 ENCOUNTER — Encounter: Payer: Self-pay | Admitting: Hematology and Oncology

## 2019-12-01 ENCOUNTER — Inpatient Hospital Stay: Payer: Medicare Other | Attending: Gynecologic Oncology

## 2019-12-01 ENCOUNTER — Ambulatory Visit
Admission: RE | Admit: 2019-12-01 | Discharge: 2019-12-01 | Disposition: A | Discharge status: Home / Self Care | Payer: Medicare Other | Source: Ambulatory Visit | Attending: Radiation Oncology | Admitting: Radiation Oncology

## 2019-12-01 ENCOUNTER — Other Ambulatory Visit: Payer: Self-pay

## 2019-12-01 ENCOUNTER — Inpatient Hospital Stay: Payer: Medicare Other

## 2019-12-01 DIAGNOSIS — K5909 Other constipation: Secondary | ICD-10-CM

## 2019-12-01 DIAGNOSIS — Z79899 Other long term (current) drug therapy: Secondary | ICD-10-CM | POA: Diagnosis not present

## 2019-12-01 DIAGNOSIS — G893 Neoplasm related pain (acute) (chronic): Secondary | ICD-10-CM | POA: Diagnosis not present

## 2019-12-01 DIAGNOSIS — C541 Malignant neoplasm of endometrium: Secondary | ICD-10-CM | POA: Insufficient documentation

## 2019-12-01 DIAGNOSIS — R110 Nausea: Secondary | ICD-10-CM | POA: Insufficient documentation

## 2019-12-01 DIAGNOSIS — Z7189 Other specified counseling: Secondary | ICD-10-CM

## 2019-12-01 DIAGNOSIS — Z510 Encounter for antineoplastic radiation therapy: Secondary | ICD-10-CM | POA: Diagnosis not present

## 2019-12-01 DIAGNOSIS — Z5112 Encounter for antineoplastic immunotherapy: Secondary | ICD-10-CM | POA: Diagnosis present

## 2019-12-01 DIAGNOSIS — Z9221 Personal history of antineoplastic chemotherapy: Secondary | ICD-10-CM | POA: Diagnosis not present

## 2019-12-01 LAB — CBC WITH DIFFERENTIAL (CANCER CENTER ONLY)
Abs Immature Granulocytes: 0.01 10*3/uL (ref 0.00–0.07)
Basophils Absolute: 0 10*3/uL (ref 0.0–0.1)
Basophils Relative: 1 %
Eosinophils Absolute: 0.1 10*3/uL (ref 0.0–0.5)
Eosinophils Relative: 1 %
HCT: 37.7 % (ref 36.0–46.0)
Hemoglobin: 13.7 g/dL (ref 12.0–15.0)
Immature Granulocytes: 0 %
Lymphocytes Relative: 19 %
Lymphs Abs: 0.9 10*3/uL (ref 0.7–4.0)
MCH: 31.9 pg (ref 26.0–34.0)
MCHC: 36.3 g/dL — ABNORMAL HIGH (ref 30.0–36.0)
MCV: 87.9 fL (ref 80.0–100.0)
Monocytes Absolute: 0.4 10*3/uL (ref 0.1–1.0)
Monocytes Relative: 8 %
Neutro Abs: 3.4 10*3/uL (ref 1.7–7.7)
Neutrophils Relative %: 71 %
Platelet Count: 146 10*3/uL — ABNORMAL LOW (ref 150–400)
RBC: 4.29 MIL/uL (ref 3.87–5.11)
RDW: 13.2 % (ref 11.5–15.5)
WBC Count: 4.8 10*3/uL (ref 4.0–10.5)
nRBC: 0 % (ref 0.0–0.2)

## 2019-12-01 LAB — CMP (CANCER CENTER ONLY)
ALT: 15 U/L (ref 0–44)
AST: 14 U/L — ABNORMAL LOW (ref 15–41)
Albumin: 4.4 g/dL (ref 3.5–5.0)
Alkaline Phosphatase: 96 U/L (ref 38–126)
Anion gap: 11 (ref 5–15)
BUN: 13 mg/dL (ref 8–23)
CO2: 26 mmol/L (ref 22–32)
Calcium: 9.3 mg/dL (ref 8.9–10.3)
Chloride: 105 mmol/L (ref 98–111)
Creatinine: 0.74 mg/dL (ref 0.44–1.00)
GFR, Est AFR Am: >60 mL/min (ref >60)
GFR, Estimated: >60 mL/min (ref >60)
Glucose, Bld: 137 mg/dL — ABNORMAL HIGH (ref 70–99)
Potassium: 3 mmol/L — CL (ref 3.5–5.1)
Sodium: 142 mmol/L (ref 135–145)
Total Bilirubin: 1 mg/dL (ref 0.3–1.2)
Total Protein: 8.1 g/dL (ref 6.5–8.1)

## 2019-12-01 LAB — TSH: TSH: 0.48 u[IU]/mL (ref 0.308–3.960)

## 2019-12-01 MED ORDER — SODIUM CHLORIDE 0.9 % IV SOLN
200.0000 mg | Freq: Once | INTRAVENOUS | Status: AC
  Administered 2019-12-01: 200 mg via INTRAVENOUS
  Filled 2019-12-01: qty 8

## 2019-12-01 MED ORDER — SODIUM CHLORIDE 0.9 % IV SOLN
Freq: Once | INTRAVENOUS | Status: AC
  Filled 2019-12-01: qty 250

## 2019-12-01 NOTE — Patient Instructions (Signed)
[facility] Discharge Instructions for Patients Receiving Chemotherapy  Today you received the following chemotherapy agents keytruda.  To help prevent nausea and vomiting after your treatment, we encourage you to take your nausea medication    If you develop nausea and vomiting that is not controlled by your nausea medication, call the clinic.   BELOW ARE SYMPTOMS THAT SHOULD BE REPORTED IMMEDIATELY:  *FEVER GREATER THAN 100.5 F  *CHILLS WITH OR WITHOUT FEVER  NAUSEA AND VOMITING THAT IS NOT CONTROLLED WITH YOUR NAUSEA MEDICATION  *UNUSUAL SHORTNESS OF BREATH  *UNUSUAL BRUISING OR BLEEDING  TENDERNESS IN MOUTH AND THROAT WITH OR WITHOUT PRESENCE OF ULCERS  *URINARY PROBLEMS  *BOWEL PROBLEMS  UNUSUAL RASH Items with * indicate a potential emergency and should be followed up as soon as possible.  Feel free to call the clinic should you have any questions or concerns. The clinic phone number is [phone].  Please show the [facility] at check-in to the Emergency Department and triage nurse.  Pembrolizumab injection What is this medicine? PEMBROLIZUMAB (pem broe liz ue mab) is a monoclonal antibody. It is used to treat certain types of cancer. This medicine may be used for other purposes; ask your health care provider or pharmacist if you have questions. COMMON BRAND NAME(S): Keytruda What should I tell my health care provider before I take this medicine? They need to know if you have any of these conditions:  diabetes  immune system problems  inflammatory bowel disease  liver disease  lung or breathing disease  lupus  received or scheduled to receive an organ transplant or a stem-cell transplant that uses donor stem cells  an unusual or allergic reaction to pembrolizumab, other medicines, foods, dyes, or preservatives  pregnant or trying to get pregnant  breast-feeding How should I use this medicine? This medicine is for infusion  into a vein. It is given by a health care professional in a hospital or clinic setting. A special MedGuide will be given to you before each treatment. Be sure to read this information carefully each time. Talk to your pediatrician regarding the use of this medicine in children. While this drug may be prescribed for children as young as 6 months for selected conditions, precautions do apply. Overdosage: If you think you have taken too much of this medicine contact a poison control center or emergency room at once. NOTE: This medicine is only for you. Do not share this medicine with others. What if I miss a dose? It is important not to miss your dose. Call your doctor or health care professional if you are unable to keep an appointment. What may interact with this medicine? Interactions have not been studied. Give your health care provider a list of all the medicines, herbs, non-prescription drugs, or dietary supplements you use. Also tell them if you smoke, drink alcohol, or use illegal drugs. Some items may interact with your medicine. This list may not describe all possible interactions. Give your health care provider a list of all the medicines, herbs, non-prescription drugs, or dietary supplements you use. Also tell them if you smoke, drink alcohol, or use illegal drugs. Some items may interact with your medicine. What should I watch for while using this medicine? Your condition will be monitored carefully while you are receiving this medicine. You may need blood work done while you are taking this medicine. Do not become pregnant while taking this medicine or for 4 months after stopping it. Women should inform  their doctor if they wish to become pregnant or think they might be pregnant. There is a potential for serious side effects to an unborn child. Talk to your health care professional or pharmacist for more information. Do not breast-feed an infant while taking this medicine or for 4 months  after the last dose. What side effects may I notice from receiving this medicine? Side effects that you should report to your doctor or health care professional as soon as possible:  allergic reactions like skin rash, itching or hives, swelling of the face, lips, or tongue  bloody or black, tarry  breathing problems  changes in vision  chest pain  chills  confusion  constipation  cough  diarrhea  dizziness or feeling faint or lightheaded  fast or irregular heartbeat  fever  flushing  joint pain  low blood counts - this medicine may decrease the number of white blood cells, red blood cells and platelets. You may be at increased risk for infections and bleeding.  muscle pain  muscle weakness  pain, tingling, numbness in the hands or feet  persistent headache  redness, blistering, peeling or loosening of the skin, including inside the mouth  signs and symptoms of high blood sugar such as dizziness; dry mouth; dry skin; fruity breath; nausea; stomach pain; increased hunger or thirst; increased urination  signs and symptoms of kidney injury like trouble passing urine or change in the amount of urine  signs and symptoms of liver injury like dark urine, light-colored stools, loss of appetite, nausea, right upper belly pain, yellowing of the eyes or skin  sweating  swollen lymph nodes  weight loss Side effects that usually do not require medical attention (report to your doctor or health care professional if they continue or are bothersome):  decreased appetite  hair loss  muscle pain  tiredness This list may not describe all possible side effects. Call your doctor for medical advice about side effects. You may report side effects to FDA at 1-800-FDA-1088. Where should I keep my medicine? This drug is given in a hospital or clinic and will not be stored at home. NOTE: This sheet is a summary. It may not cover all possible information. If you have questions  about this medicine, talk to your doctor, pharmacist, or health care provider.  2020 Elsevier/Gold Standard (2019-08-07 18:07:58)

## 2019-12-01 NOTE — Assessment & Plan Note (Signed)
She denies further nausea or vomiting without taking narcotic prescriptions She has antiemetics to take as needed

## 2019-12-01 NOTE — Assessment & Plan Note (Signed)
Her bowel habits are well regulated with narcotics She have laxative to take as needed

## 2019-12-01 NOTE — Assessment & Plan Note (Signed)
She tolerated pembrolizumab well without major side effects We will proceed with treatment as scheduled

## 2019-12-01 NOTE — Assessment & Plan Note (Signed)
Her cancer pain is gone I suspect she has responded well to treatment

## 2019-12-01 NOTE — Progress Notes (Signed)
[place] OFFICE PROGRESS NOTE  Patient Care Team: Buzzy Han, MD as PCP - General (Family Medicine)  ASSESSMENT & PLAN:  Endometrial cancer [facility]) She tolerated pembrolizumab well without major side effects We will proceed with treatment as scheduled  Cancer associated pain Her cancer pain is gone I suspect she has responded well to treatment  Nausea without vomiting She denies further nausea or vomiting without taking narcotic prescriptions She has antiemetics to take as needed  Other constipation Her bowel habits are well regulated with narcotics She have laxative to take as needed   No orders of the defined types were placed in this encounter.   All questions were answered. The patient knows to call the clinic with any problems, questions or concerns. The total time spent in the appointment was 20 minutes encounter with patients including review of chart and various tests results, discussions about plan of care and coordination of care plan   [name], MD 12/01/2019 3:24 PM  INTERVAL HISTORY: Please see below for problem oriented charting. She returns with her son for further follow-up and further infusion treatment today She is doing very well Her pain has resolved She is eating well No further nausea or vomiting No recent constipation She denies recent infusion reactions She tolerated radiation therapy very well  SUMMARY OF ONCOLOGIC HISTORY: Oncology History Overview Note  MMR - Abnormal   Endometrial cancer ([place])  06/01/2013 Initial Diagnosis   Endometrial cancer   06/01/2013 Initial Biopsy   EMB for AUB Endometrium, biopsy - POSITIVE FOR ENDOMETRIAL ADENOCARCINOMA, SEE COMMENT. Microscopic Comment As sampled the endometrial adenocarcinoma appears to be endometrioid type, FIGO Grade I.   07/21/2013 Surgery   PREOPERATIVE DIAGNOSIS: Grade 1 endometrial carcinoma. Uterine fibroids    PROCEDURE: Total abdominal hysterectomy,  bilateral salpingo-oophorectomy, pelvic lymphadenectomy   SURGEON: [name], M.D  SURGICAL FINDINGS: At the time of exploratory laparotomy the uterus was approximately 16-18 weeks size with multiple subserosal fibroids. Tubes and ovaries were normal. On frozen section patient had endometrial carcinoma invading only the inner half of the myometrium. Exploration the upper abdomen revealed no evidence of metastatic disease. There was no pelvic or para-aortic lymphadenopathy.     07/21/2013 Pathologic Stage   1. Uterus +/- tubes/ovaries, neoplastic - INVASIVE ENDOMETRIOID CARCINOMA, FIGO GRADE II, WITH SQUAMOUS DIFFERENTIATION, CONFINED WITHIN INNER HALF OF THE MYOMETRIUM. - LEIOMYOMATA AND ADENOMYOSIS. - CERVIX: BENIGN SQUAMOUS MUCOSA AND ENDOCERVICAL MUCOSA, NO DYSPLASIA OR MALIGNANCY. - BILATERAL OVARIES AND FALLOPIAN TUBES: NO PATHOLOGIC ABNORMALITIES. 2. Lymph nodes, regional resection, left pelvic - FOUR LYMPH NODES, NO EVIDENCE OF METASTATIC CARCINOMA (0/4). 3. Lymph nodes, regional resection, right pelvic - THREE LYMPH NODES, NEGATIVE FOR METASTATIC CARCINOMA (0/3). Microscopic Comment 1. ONCOLOGY TABLE - UTERUS, CARCINOMA OR CARCINOSARCOMA Specimen: Uterus, cervix, bilateral ovaries and fallopian tubes Procedure: Total hysterectomy and bilateral salpingo-oophorectomy Lymph node sampling performed: Yes Specimen integrity: Intact Maximum tumor size (cm): 2.4 cm, gross measurement Histologic type: Invasive endometrioid carcinoma with squamous differentiation Grade: FIGO II Myometrial invasion: 1.1 cm where myometrium is 3.7 cm in thickness Cervical stromal involvement: Not identified Extent of involvement of other organs: No Lymph vascular invasion: Not identified Peritoneal washings: Negative (Please correlate with [id number]) Lymph nodes: number examined 7; number positive 0 Pelvic lymph nodes: 0 involved of 7 lymph nodes Para-aortic lymph nodes: N/A TNM code: pT1a,  pN0 FIGO Stage (based on pathologic findings, needs clinical correlation): IA   09/22/2019 Imaging   CT A/P: There is a left paraspinous mass which  is poorly defined and low-density measuring 3 x 2.2 by 5.5 cm. Deviates the left ureter laterally. No additional periaortic masses are identified.   10/13/2019 Pathology Results   A. LYMPH NODE, LEFT PARASPINAL, NEEDLE CORE BIOPSY: - Poorly differentiated carcinoma. COMMENT: Poorly differentiated carcinoma is present in the background of a majority of necrosis and fibrosis tissue. No distinct nodal tissue is identified. Immunohistochemistry for CK7, PAX 8 and ER is positive. CK20, TTF-1, CDX-2 and GATA-3 are negative. The immunophenotype is compatible with the provided clinical history of gynecologic cancer.    Genetic Testing   Patient has genetic testing done for MMR. Results revealed patient has the following mutation(s): MMR - Abnormal   10/27/2019 PET scan   1. Poorly marginated hypermetabolic nodal conglomerate in the left para-aortic and left common iliac chains, compatible with nodal metastatic disease. 2. No additional hypermetabolic sites of metastatic disease. 3. Right upper lobe 2 mm solid pulmonary nodule, below PET resolution, recommend attention on follow-up chest CT in 3-6 months. 4. Chronic findings include: Aortic Atherosclerosis (ICD10-I70.0). Coronary atherosclerosis. Mild left colonic diverticulosis. Mild biliary and pancreatic duct dilation of uncertain etiology, unchanged since 09/22/2019 CT.   11/10/2019 -  Chemotherapy   The patient had pembrolizumab (KEYTRUDA) 200 mg in sodium chloride 0.9 % 50 mL chemo infusion, 200 mg, Intravenous, Once, 1 of 6 cycles Administration: 200 mg (11/10/2019)  for chemotherapy treatment.      REVIEW OF SYSTEMS:   Constitutional: Denies fevers, chills or abnormal weight loss Eyes: Denies blurriness of vision Ears, nose, mouth, throat, and face: Denies mucositis or sore  throat Respiratory: Denies cough, dyspnea or wheezes Cardiovascular: Denies palpitation, chest discomfort or lower extremity swelling Gastrointestinal:  Denies nausea, heartburn or change in bowel habits Skin: Denies abnormal skin rashes Lymphatics: Denies new lymphadenopathy or easy bruising Neurological:Denies numbness, tingling or new weaknesses Behavioral/Psych: Mood is stable, no new changes  All other systems were reviewed with the patient and are negative.  I have reviewed the past medical history, past surgical history, social history and family history with the patient and they are unchanged from previous note.  ALLERGIES:  is allergic to iron; iron dextran; penicillins; amoxicillin; ampicillin; codeine; diamox [acetazolamide]; penicillin g; and other.  MEDICATIONS:  Current Outpatient Medications  Medication Sig Dispense Refill  . amLODipine (NORVASC) 10 MG tablet Take by mouth.    Marland Kitchen atorvastatin (LIPITOR) 20 MG tablet Take 1 tablet (20 mg total) by mouth daily. 30 tablet 3  . brimonidine (ALPHAGAN) 0.2 % ophthalmic solution     . busPIRone (BUSPAR) 7.5 MG tablet Take 7.5 mg by mouth 2 (two) times daily.    . dorzolamide-timolol (COSOPT) 22.3-6.8 MG/ML ophthalmic solution Place 1 drop into the left eye 2 (two) times daily.    . hydrochlorothiazide (HYDRODIURIL) 25 MG tablet Take 1 tablet (25 mg total) by mouth daily. Discontinue amlodipine (Patient not taking: Reported on 03/11/2019) 90 tablet 1  . lisinopril (ZESTRIL) 40 MG tablet Take 1 tablet by mouth once daily 90 tablet 0  . magnesium 30 MG tablet Take by mouth daily.    . metoprolol tartrate (LOPRESSOR) 25 MG tablet Take 1 tablet (25 mg total) by mouth 2 (two) times daily. 180 tablet 1  . Netarsudil Dimesylate 0.02 % SOLN Apply to eye.    . ondansetron (ZOFRAN) 8 MG tablet Take 1 tablet (8 mg total) by mouth every 8 (eight) hours as needed for nausea. 30 tablet 3  . prochlorperazine (COMPAZINE) 10 MG tablet Take 1 tablet  (  10 mg total) by mouth every 6 (six) hours as needed for nausea or vomiting. 30 tablet 0  . VYZULTA 0.024 % SOLN      No current facility-administered medications for this visit.    PHYSICAL EXAMINATION: ECOG PERFORMANCE STATUS: 1 - Symptomatic but completely ambulatory  Vitals:   12/01/19 1517  BP: (!) 146/83  Pulse: 88  Resp: 17  Temp: 98.5 F (36.9 C)  SpO2: 100%   Filed Weights   12/01/19 1517  Weight: 151 lb 8 oz (68.7 kg)    GENERAL:alert, no distress and comfortable SKIN: skin color, texture, turgor are normal, no rashes or significant lesions EYES: normal, Conjunctiva are pink and non-injected, sclera clear OROPHARYNX:no exudate, no erythema and lips, buccal mucosa, and tongue normal  NECK: supple, thyroid normal size, non-tender, without nodularity LYMPH:  no palpable lymphadenopathy in the cervical, axillary or inguinal LUNGS: clear to auscultation and percussion with normal breathing effort HEART: regular rate & rhythm and no murmurs and no lower extremity edema ABDOMEN:abdomen soft, non-tender and normal bowel sounds Musculoskeletal:no cyanosis of digits and no clubbing  NEURO: alert & oriented x 3 with fluent speech, no focal motor/sensory deficits  LABORATORY DATA:  I have reviewed the data as listed    Component Value Date/Time   NA 142 11/04/2019 1000   NA 139 03/03/2019 0934   NA 142 07/09/2013 1144   K 3.5 11/04/2019 1000   K 3.7 07/09/2013 1144   CL 104 11/04/2019 1000   CO2 27 11/04/2019 1000   CO2 23 07/09/2013 1144   GLUCOSE 128 (H) 11/04/2019 1000   GLUCOSE 115 07/09/2013 1144   BUN 12 11/04/2019 1000   BUN 25 03/03/2019 0934   BUN 14.4 07/09/2013 1144   CREATININE 0.83 11/04/2019 1000   CREATININE 0.82 01/02/2017 1159   CREATININE 0.8 07/09/2013 1144   CALCIUM 9.4 11/04/2019 1000   CALCIUM 9.3 07/09/2013 1144   PROT 8.3 (H) 11/04/2019 1000   PROT 8.0 03/03/2019 0934   PROT 7.8 07/09/2013 1144   ALBUMIN 4.5 11/04/2019 1000   ALBUMIN  4.7 03/03/2019 0934   ALBUMIN 3.7 07/09/2013 1144   AST 12 (L) 11/04/2019 1000   AST 9 07/09/2013 1144   ALT 9 11/04/2019 1000   ALT <6 07/09/2013 1144   ALKPHOS 94 11/04/2019 1000   ALKPHOS 58 07/09/2013 1144   BILITOT 0.8 11/04/2019 1000   BILITOT 0.40 07/09/2013 1144   GFRNONAA >60 11/04/2019 1000   GFRNONAA 74 02/01/2015 1238   GFRAA >60 11/04/2019 1000   GFRAA 85 02/01/2015 1238    No results found for: SPEP, UPEP  Lab Results  Component Value Date   WBC 4.8 12/01/2019   NEUTROABS 3.4 12/01/2019   HGB 13.7 12/01/2019   HCT 37.7 12/01/2019   MCV 87.9 12/01/2019   PLT 146 (L) 12/01/2019      Chemistry      Component Value Date/Time   NA 142 11/04/2019 1000   NA 139 03/03/2019 0934   NA 142 07/09/2013 1144   K 3.5 11/04/2019 1000   K 3.7 07/09/2013 1144   CL 104 11/04/2019 1000   CO2 27 11/04/2019 1000   CO2 23 07/09/2013 1144   BUN 12 11/04/2019 1000   BUN 25 03/03/2019 0934   BUN 14.4 07/09/2013 1144   CREATININE 0.83 11/04/2019 1000   CREATININE 0.82 01/02/2017 1159   CREATININE 0.8 07/09/2013 1144      Component Value Date/Time   CALCIUM 9.4 11/04/2019 1000  CALCIUM 9.3 07/09/2013 1144   ALKPHOS 94 11/04/2019 1000   ALKPHOS 58 07/09/2013 1144   AST 12 (L) 11/04/2019 1000   AST 9 07/09/2013 1144   ALT 9 11/04/2019 1000   ALT <6 07/09/2013 1144   BILITOT 0.8 11/04/2019 1000   BILITOT 0.40 07/09/2013 1144

## 2019-12-02 ENCOUNTER — Ambulatory Visit
Admission: RE | Admit: 2019-12-02 | Discharge: 2019-12-02 | Disposition: A | Discharge status: Home / Self Care | Payer: Medicare Other | Source: Ambulatory Visit | Attending: Radiation Oncology | Admitting: Radiation Oncology

## 2019-12-02 ENCOUNTER — Other Ambulatory Visit: Payer: Self-pay

## 2019-12-02 DIAGNOSIS — Z510 Encounter for antineoplastic radiation therapy: Secondary | ICD-10-CM | POA: Diagnosis not present

## 2019-12-03 ENCOUNTER — Ambulatory Visit: Payer: Medicare Other

## 2019-12-04 ENCOUNTER — Ambulatory Visit
Admission: RE | Admit: 2019-12-04 | Discharge: 2019-12-04 | Disposition: A | Discharge status: Home / Self Care | Payer: Medicare Other | Source: Ambulatory Visit | Attending: Radiation Oncology | Admitting: Radiation Oncology

## 2019-12-04 ENCOUNTER — Other Ambulatory Visit: Payer: Self-pay

## 2019-12-04 DIAGNOSIS — Z510 Encounter for antineoplastic radiation therapy: Secondary | ICD-10-CM | POA: Diagnosis not present

## 2019-12-07 ENCOUNTER — Ambulatory Visit
Admission: RE | Admit: 2019-12-07 | Discharge: 2019-12-07 | Disposition: A | Discharge status: Home / Self Care | Payer: Medicare Other | Source: Ambulatory Visit | Attending: Radiation Oncology | Admitting: Radiation Oncology

## 2019-12-07 ENCOUNTER — Telehealth: Payer: Self-pay | Admitting: Hematology and Oncology

## 2019-12-07 ENCOUNTER — Other Ambulatory Visit: Payer: Self-pay

## 2019-12-07 DIAGNOSIS — Z510 Encounter for antineoplastic radiation therapy: Secondary | ICD-10-CM | POA: Diagnosis not present

## 2019-12-07 NOTE — Telephone Encounter (Signed)
Scheduled appt per 2/16 sch message - pt is aware of appt date and time   

## 2019-12-08 ENCOUNTER — Ambulatory Visit
Admission: RE | Admit: 2019-12-08 | Discharge: 2019-12-08 | Disposition: A | Discharge status: Home / Self Care | Payer: Medicare Other | Source: Ambulatory Visit | Attending: Radiation Oncology | Admitting: Radiation Oncology

## 2019-12-08 ENCOUNTER — Other Ambulatory Visit: Payer: Self-pay

## 2019-12-08 DIAGNOSIS — Z510 Encounter for antineoplastic radiation therapy: Secondary | ICD-10-CM | POA: Diagnosis not present

## 2019-12-09 ENCOUNTER — Ambulatory Visit
Admission: RE | Admit: 2019-12-09 | Discharge: 2019-12-09 | Disposition: A | Discharge status: Home / Self Care | Payer: Medicare Other | Source: Ambulatory Visit | Attending: Radiation Oncology | Admitting: Radiation Oncology

## 2019-12-09 ENCOUNTER — Other Ambulatory Visit: Payer: Self-pay

## 2019-12-09 DIAGNOSIS — Z510 Encounter for antineoplastic radiation therapy: Secondary | ICD-10-CM | POA: Diagnosis not present

## 2019-12-10 ENCOUNTER — Ambulatory Visit
Admission: RE | Admit: 2019-12-10 | Discharge: 2019-12-10 | Disposition: A | Discharge status: Home / Self Care | Payer: Medicare Other | Source: Ambulatory Visit | Attending: Radiation Oncology | Admitting: Radiation Oncology

## 2019-12-10 ENCOUNTER — Other Ambulatory Visit: Payer: Self-pay

## 2019-12-10 DIAGNOSIS — Z510 Encounter for antineoplastic radiation therapy: Secondary | ICD-10-CM | POA: Diagnosis not present

## 2019-12-11 ENCOUNTER — Other Ambulatory Visit: Payer: Self-pay

## 2019-12-11 ENCOUNTER — Ambulatory Visit
Admission: RE | Admit: 2019-12-11 | Discharge: 2019-12-11 | Disposition: A | Discharge status: Home / Self Care | Payer: Medicare Other | Source: Ambulatory Visit | Attending: Radiation Oncology | Admitting: Radiation Oncology

## 2019-12-11 DIAGNOSIS — Z510 Encounter for antineoplastic radiation therapy: Secondary | ICD-10-CM | POA: Diagnosis not present

## 2019-12-14 ENCOUNTER — Other Ambulatory Visit: Payer: Self-pay

## 2019-12-14 ENCOUNTER — Ambulatory Visit
Admission: RE | Admit: 2019-12-14 | Discharge: 2019-12-14 | Disposition: A | Discharge status: Home / Self Care | Payer: Medicare Other | Source: Ambulatory Visit | Attending: Radiation Oncology | Admitting: Radiation Oncology

## 2019-12-14 DIAGNOSIS — Z510 Encounter for antineoplastic radiation therapy: Secondary | ICD-10-CM | POA: Diagnosis not present

## 2019-12-14 DIAGNOSIS — C541 Malignant neoplasm of endometrium: Secondary | ICD-10-CM | POA: Diagnosis not present

## 2019-12-15 ENCOUNTER — Ambulatory Visit
Admission: RE | Admit: 2019-12-15 | Discharge: 2019-12-15 | Disposition: A | Discharge status: Home / Self Care | Payer: Medicare Other | Source: Ambulatory Visit | Attending: Radiation Oncology | Admitting: Radiation Oncology

## 2019-12-15 ENCOUNTER — Other Ambulatory Visit: Payer: Self-pay

## 2019-12-15 DIAGNOSIS — Z510 Encounter for antineoplastic radiation therapy: Secondary | ICD-10-CM | POA: Diagnosis not present

## 2019-12-16 ENCOUNTER — Ambulatory Visit
Admission: RE | Admit: 2019-12-16 | Discharge: 2019-12-16 | Disposition: A | Discharge status: Home / Self Care | Payer: Medicare Other | Source: Ambulatory Visit | Attending: Radiation Oncology | Admitting: Radiation Oncology

## 2019-12-16 ENCOUNTER — Other Ambulatory Visit: Payer: Self-pay

## 2019-12-16 DIAGNOSIS — Z510 Encounter for antineoplastic radiation therapy: Secondary | ICD-10-CM | POA: Diagnosis not present

## 2019-12-17 ENCOUNTER — Other Ambulatory Visit: Payer: Self-pay

## 2019-12-17 ENCOUNTER — Ambulatory Visit
Admission: RE | Admit: 2019-12-17 | Discharge: 2019-12-17 | Disposition: A | Discharge status: Home / Self Care | Payer: Medicare Other | Source: Ambulatory Visit | Attending: Radiation Oncology | Admitting: Radiation Oncology

## 2019-12-17 DIAGNOSIS — Z510 Encounter for antineoplastic radiation therapy: Secondary | ICD-10-CM | POA: Diagnosis not present

## 2019-12-18 ENCOUNTER — Other Ambulatory Visit: Payer: Self-pay

## 2019-12-18 ENCOUNTER — Ambulatory Visit
Admission: RE | Admit: 2019-12-18 | Discharge: 2019-12-18 | Disposition: A | Discharge status: Home / Self Care | Payer: Medicare Other | Source: Ambulatory Visit | Attending: Radiation Oncology | Admitting: Radiation Oncology

## 2019-12-18 DIAGNOSIS — Z510 Encounter for antineoplastic radiation therapy: Secondary | ICD-10-CM | POA: Diagnosis not present

## 2019-12-19 ENCOUNTER — Ambulatory Visit: Payer: Medicare Other | Attending: Internal Medicine

## 2019-12-19 DIAGNOSIS — Z23 Encounter for immunization: Secondary | ICD-10-CM | POA: Insufficient documentation

## 2019-12-19 NOTE — Progress Notes (Signed)
   Covid-19 Vaccination Clinic  Name:  Stephanie [name]    MRN: [id number] DOB: 04-09-1946  12/19/2019  [name] was observed post Covid-19 immunization for 15 minutes without incident. She was provided with Vaccine Information Sheet and instruction to access the V-Safe system.   [name] was instructed to call 911 with any severe reactions post vaccine: Marland Kitchen Difficulty breathing  . Swelling of face and throat  . A fast heartbeat  . A bad rash all over body  . Dizziness and weakness   Immunizations Administered    Name Date Dose VIS Date Route   Pfizer COVID-19 Vaccine 12/19/2019  2:08 PM 0.3 mL 09/25/2019 Intramuscular   Manufacturer: [place]   Lot: [id number]   [place]: [id number]

## 2019-12-21 ENCOUNTER — Ambulatory Visit
Admission: RE | Admit: 2019-12-21 | Discharge: 2019-12-21 | Disposition: A | Discharge status: Home / Self Care | Payer: Medicare Other | Source: Ambulatory Visit | Attending: Radiation Oncology | Admitting: Radiation Oncology

## 2019-12-21 ENCOUNTER — Ambulatory Visit: Payer: Medicare Other

## 2019-12-21 ENCOUNTER — Other Ambulatory Visit: Payer: Self-pay

## 2019-12-21 DIAGNOSIS — Z510 Encounter for antineoplastic radiation therapy: Secondary | ICD-10-CM | POA: Diagnosis not present

## 2019-12-22 ENCOUNTER — Other Ambulatory Visit: Payer: Self-pay

## 2019-12-22 ENCOUNTER — Inpatient Hospital Stay: Payer: Medicare Other

## 2019-12-22 ENCOUNTER — Encounter: Payer: Self-pay | Admitting: Hematology and Oncology

## 2019-12-22 ENCOUNTER — Ambulatory Visit
Admission: RE | Admit: 2019-12-22 | Discharge: 2019-12-22 | Disposition: A | Discharge status: Home / Self Care | Payer: Medicare Other | Source: Ambulatory Visit | Attending: Radiation Oncology | Admitting: Radiation Oncology

## 2019-12-22 ENCOUNTER — Ambulatory Visit: Payer: Medicare Other

## 2019-12-22 ENCOUNTER — Inpatient Hospital Stay: Payer: Medicare Other | Attending: Gynecologic Oncology

## 2019-12-22 ENCOUNTER — Inpatient Hospital Stay: Payer: Medicare Other | Admitting: Hematology and Oncology

## 2019-12-22 DIAGNOSIS — Z510 Encounter for antineoplastic radiation therapy: Secondary | ICD-10-CM | POA: Diagnosis not present

## 2019-12-22 DIAGNOSIS — C541 Malignant neoplasm of endometrium: Secondary | ICD-10-CM | POA: Insufficient documentation

## 2019-12-22 DIAGNOSIS — Z79899 Other long term (current) drug therapy: Secondary | ICD-10-CM | POA: Diagnosis not present

## 2019-12-22 DIAGNOSIS — Z5112 Encounter for antineoplastic immunotherapy: Secondary | ICD-10-CM | POA: Diagnosis not present

## 2019-12-22 DIAGNOSIS — Z7189 Other specified counseling: Secondary | ICD-10-CM

## 2019-12-22 DIAGNOSIS — C569 Malignant neoplasm of unspecified ovary: Secondary | ICD-10-CM | POA: Diagnosis not present

## 2019-12-22 LAB — CBC WITH DIFFERENTIAL (CANCER CENTER ONLY)
Abs Immature Granulocytes: 0.01 10*3/uL (ref 0.00–0.07)
Basophils Absolute: 0 10*3/uL (ref 0.0–0.1)
Basophils Relative: 0 %
Eosinophils Absolute: 0.1 10*3/uL (ref 0.0–0.5)
Eosinophils Relative: 3 %
HCT: 38.8 % (ref 36.0–46.0)
Hemoglobin: 13.6 g/dL (ref 12.0–15.0)
Immature Granulocytes: 0 %
Lymphocytes Relative: 15 %
Lymphs Abs: 0.7 10*3/uL (ref 0.7–4.0)
MCH: 31.9 pg (ref 26.0–34.0)
MCHC: 35.1 g/dL (ref 30.0–36.0)
MCV: 90.9 fL (ref 80.0–100.0)
Monocytes Absolute: 0.6 10*3/uL (ref 0.1–1.0)
Monocytes Relative: 12 %
Neutro Abs: 3.4 10*3/uL (ref 1.7–7.7)
Neutrophils Relative %: 70 %
Platelet Count: 156 10*3/uL (ref 150–400)
RBC: 4.27 MIL/uL (ref 3.87–5.11)
RDW: 13 % (ref 11.5–15.5)
WBC Count: 4.8 10*3/uL (ref 4.0–10.5)
nRBC: 0 % (ref 0.0–0.2)

## 2019-12-22 LAB — CMP (CANCER CENTER ONLY)
ALT: 15 U/L (ref 0–44)
AST: 18 U/L (ref 15–41)
Albumin: 4.1 g/dL (ref 3.5–5.0)
Alkaline Phosphatase: 86 U/L (ref 38–126)
Anion gap: 9 (ref 5–15)
BUN: 17 mg/dL (ref 8–23)
CO2: 25 mmol/L (ref 22–32)
Calcium: 9.4 mg/dL (ref 8.9–10.3)
Chloride: 106 mmol/L (ref 98–111)
Creatinine: 0.81 mg/dL (ref 0.44–1.00)
GFR, Est AFR Am: >60 mL/min (ref >60)
GFR, Estimated: >60 mL/min (ref >60)
Glucose, Bld: 121 mg/dL — ABNORMAL HIGH (ref 70–99)
Potassium: 3.9 mmol/L (ref 3.5–5.1)
Sodium: 140 mmol/L (ref 135–145)
Total Bilirubin: 0.7 mg/dL (ref 0.3–1.2)
Total Protein: 7.6 g/dL (ref 6.5–8.1)

## 2019-12-22 LAB — TSH: TSH: 1.152 u[IU]/mL (ref 0.308–3.960)

## 2019-12-22 MED ORDER — SODIUM CHLORIDE 0.9 % IV SOLN
Freq: Once | INTRAVENOUS | Status: AC
  Filled 2019-12-22: qty 250

## 2019-12-22 MED ORDER — SODIUM CHLORIDE 0.9 % IV SOLN
200.0000 mg | Freq: Once | INTRAVENOUS | Status: AC
  Administered 2019-12-22: 200 mg via INTRAVENOUS
  Filled 2019-12-22: qty 8

## 2019-12-22 NOTE — Progress Notes (Signed)
[place] OFFICE PROGRESS NOTE  Patient Care Team: Buzzy Han, MD as PCP - General (Family Medicine)  ASSESSMENT & PLAN:  Endometrial cancer [facility]) She tolerated pembrolizumab well without major side effects We will proceed with treatment as scheduled I told her and her son that it would take several months before repeat imaging study after she completed radiation treatment   No orders of the defined types were placed in this encounter.   All questions were answered. The patient knows to call the clinic with any problems, questions or concerns. The total time spent in the appointment was 20 minutes encounter with patients including review of chart and various tests results, discussions about plan of care and coordination of care plan   [name], MD 12/22/2019 11:15 AM  INTERVAL HISTORY: Please see below for problem oriented charting. She returns for further follow-up She denies recent nausea No pain Her bowel habits are regular She denies infusion reaction  SUMMARY OF ONCOLOGIC HISTORY: Oncology History Overview Note  MMR - Abnormal   Endometrial cancer ([place])  06/01/2013 Initial Diagnosis   Endometrial cancer   06/01/2013 Initial Biopsy   EMB for AUB Endometrium, biopsy - POSITIVE FOR ENDOMETRIAL ADENOCARCINOMA, SEE COMMENT. Microscopic Comment As sampled the endometrial adenocarcinoma appears to be endometrioid type, FIGO Grade I.   07/21/2013 Surgery   PREOPERATIVE DIAGNOSIS: Grade 1 endometrial carcinoma. Uterine fibroids    PROCEDURE: Total abdominal hysterectomy, bilateral salpingo-oophorectomy, pelvic lymphadenectomy   SURGEON: [name], M.D  SURGICAL FINDINGS: At the time of exploratory laparotomy the uterus was approximately 16-18 weeks size with multiple subserosal fibroids. Tubes and ovaries were normal. On frozen section patient had endometrial carcinoma invading only the inner half of the myometrium. Exploration the  upper abdomen revealed no evidence of metastatic disease. There was no pelvic or para-aortic lymphadenopathy.     07/21/2013 Pathologic Stage   1. Uterus +/- tubes/ovaries, neoplastic - INVASIVE ENDOMETRIOID CARCINOMA, FIGO GRADE II, WITH SQUAMOUS DIFFERENTIATION, CONFINED WITHIN INNER HALF OF THE MYOMETRIUM. - LEIOMYOMATA AND ADENOMYOSIS. - CERVIX: BENIGN SQUAMOUS MUCOSA AND ENDOCERVICAL MUCOSA, NO DYSPLASIA OR MALIGNANCY. - BILATERAL OVARIES AND FALLOPIAN TUBES: NO PATHOLOGIC ABNORMALITIES. 2. Lymph nodes, regional resection, left pelvic - FOUR LYMPH NODES, NO EVIDENCE OF METASTATIC CARCINOMA (0/4). 3. Lymph nodes, regional resection, right pelvic - THREE LYMPH NODES, NEGATIVE FOR METASTATIC CARCINOMA (0/3). Microscopic Comment 1. ONCOLOGY TABLE - UTERUS, CARCINOMA OR CARCINOSARCOMA Specimen: Uterus, cervix, bilateral ovaries and fallopian tubes Procedure: Total hysterectomy and bilateral salpingo-oophorectomy Lymph node sampling performed: Yes Specimen integrity: Intact Maximum tumor size (cm): 2.4 cm, gross measurement Histologic type: Invasive endometrioid carcinoma with squamous differentiation Grade: FIGO II Myometrial invasion: 1.1 cm where myometrium is 3.7 cm in thickness Cervical stromal involvement: Not identified Extent of involvement of other organs: No Lymph vascular invasion: Not identified Peritoneal washings: Negative (Please correlate with [id number]) Lymph nodes: number examined 7; number positive 0 Pelvic lymph nodes: 0 involved of 7 lymph nodes Para-aortic lymph nodes: N/A TNM code: pT1a, pN0 FIGO Stage (based on pathologic findings, needs clinical correlation): IA   09/22/2019 Imaging   CT A/P: There is a left paraspinous mass which is poorly defined and low-density measuring 3 x 2.2 by 5.5 cm. Deviates the left ureter laterally. No additional periaortic masses are identified.   10/13/2019 Pathology Results   A. LYMPH NODE, LEFT PARASPINAL, NEEDLE CORE  BIOPSY: - Poorly differentiated carcinoma. COMMENT: Poorly differentiated carcinoma is present in the background of a majority of necrosis and fibrosis tissue. No  distinct nodal tissue is identified. Immunohistochemistry for CK7, PAX 8 and ER is positive. CK20, TTF-1, CDX-2 and GATA-3 are negative. The immunophenotype is compatible with the provided clinical history of gynecologic cancer.    Genetic Testing   Patient has genetic testing done for MMR. Results revealed patient has the following mutation(s): MMR - Abnormal   10/27/2019 PET scan   1. Poorly marginated hypermetabolic nodal conglomerate in the left para-aortic and left common iliac chains, compatible with nodal metastatic disease. 2. No additional hypermetabolic sites of metastatic disease. 3. Right upper lobe 2 mm solid pulmonary nodule, below PET resolution, recommend attention on follow-up chest CT in 3-6 months. 4. Chronic findings include: Aortic Atherosclerosis (ICD10-I70.0). Coronary atherosclerosis. Mild left colonic diverticulosis. Mild biliary and pancreatic duct dilation of uncertain etiology, unchanged since 09/22/2019 CT.   11/10/2019 -  Chemotherapy   The patient had pembrolizumab (KEYTRUDA) 200 mg in sodium chloride 0.9 % 50 mL chemo infusion, 200 mg, Intravenous, Once, 3 of 6 cycles Administration: 200 mg (11/10/2019), 200 mg (12/01/2019)  for chemotherapy treatment.      REVIEW OF SYSTEMS:   Constitutional: Denies fevers, chills or abnormal weight loss Eyes: Denies blurriness of vision Ears, nose, mouth, throat, and face: Denies mucositis or sore throat Respiratory: Denies cough, dyspnea or wheezes Cardiovascular: Denies palpitation, chest discomfort or lower extremity swelling Gastrointestinal:  Denies nausea, heartburn or change in bowel habits Skin: Denies abnormal skin rashes Lymphatics: Denies new lymphadenopathy or easy bruising Neurological:Denies numbness, tingling or new weaknesses Behavioral/Psych:  Mood is stable, no new changes  All other systems were reviewed with the patient and are negative.  I have reviewed the past medical history, past surgical history, social history and family history with the patient and they are unchanged from previous note.  ALLERGIES:  is allergic to iron; iron dextran; penicillins; amoxicillin; ampicillin; codeine; diamox [acetazolamide]; penicillin g; and other.  MEDICATIONS:  Current Outpatient Medications  Medication Sig Dispense Refill  . amLODipine (NORVASC) 10 MG tablet Take by mouth.    Marland Kitchen atorvastatin (LIPITOR) 20 MG tablet Take 1 tablet (20 mg total) by mouth daily. 30 tablet 3  . brimonidine (ALPHAGAN) 0.2 % ophthalmic solution     . busPIRone (BUSPAR) 7.5 MG tablet Take 7.5 mg by mouth 2 (two) times daily.    . dorzolamide-timolol (COSOPT) 22.3-6.8 MG/ML ophthalmic solution Place 1 drop into the left eye 2 (two) times daily.    . hydrochlorothiazide (HYDRODIURIL) 25 MG tablet Take 1 tablet (25 mg total) by mouth daily. Discontinue amlodipine (Patient not taking: Reported on 03/11/2019) 90 tablet 1  . lisinopril (ZESTRIL) 40 MG tablet Take 1 tablet by mouth once daily 90 tablet 0  . magnesium 30 MG tablet Take by mouth daily.    . metoprolol tartrate (LOPRESSOR) 25 MG tablet Take 1 tablet (25 mg total) by mouth 2 (two) times daily. 180 tablet 1  . Netarsudil Dimesylate 0.02 % SOLN Apply to eye.    . ondansetron (ZOFRAN) 8 MG tablet Take 1 tablet (8 mg total) by mouth every 8 (eight) hours as needed for nausea. 30 tablet 3  . prochlorperazine (COMPAZINE) 10 MG tablet Take 1 tablet (10 mg total) by mouth every 6 (six) hours as needed for nausea or vomiting. 30 tablet 0  . VYZULTA 0.024 % SOLN      No current facility-administered medications for this visit.   Facility-Administered Medications Ordered in Other Visits  Medication Dose Route Frequency Provider Last Rate Last Admin  . pembrolizumab (  KEYTRUDA) 200 mg in sodium chloride 0.9 % 50 mL  chemo infusion  200 mg Intravenous Once Alvy Bimler, Treesa Mccully, MD        PHYSICAL EXAMINATION: ECOG PERFORMANCE STATUS: 1 - Symptomatic but completely ambulatory  Vitals:   12/22/19 1032  BP: (!) 169/88  Pulse: 90  Resp: 17  Temp: 98.5 F (36.9 C)  SpO2: 100%   Filed Weights   12/22/19 1032  Weight: 152 lb 6.4 oz (69.1 kg)    GENERAL:alert, no distress and comfortable SKIN: skin color, texture, turgor are normal, no rashes or significant lesions EYES: normal, Conjunctiva are pink and non-injected, sclera clear OROPHARYNX:no exudate, no erythema and lips, buccal mucosa, and tongue normal  NECK: supple, thyroid normal size, non-tender, without nodularity LYMPH:  no palpable lymphadenopathy in the cervical, axillary or inguinal LUNGS: clear to auscultation and percussion with normal breathing effort HEART: regular rate & rhythm and no murmurs and no lower extremity edema ABDOMEN:abdomen soft, non-tender and normal bowel sounds Musculoskeletal:no cyanosis of digits and no clubbing  NEURO: alert & oriented x 3 with fluent speech, no focal motor/sensory deficits  LABORATORY DATA:  I have reviewed the data as listed    Component Value Date/Time   NA 140 12/22/2019 1015   NA 139 03/03/2019 0934   NA 142 07/09/2013 1144   K 3.9 12/22/2019 1015   K 3.7 07/09/2013 1144   CL 106 12/22/2019 1015   CO2 25 12/22/2019 1015   CO2 23 07/09/2013 1144   GLUCOSE 121 (H) 12/22/2019 1015   GLUCOSE 115 07/09/2013 1144   BUN 17 12/22/2019 1015   BUN 25 03/03/2019 0934   BUN 14.4 07/09/2013 1144   CREATININE 0.81 12/22/2019 1015   CREATININE 0.82 01/02/2017 1159   CREATININE 0.8 07/09/2013 1144   CALCIUM 9.4 12/22/2019 1015   CALCIUM 9.3 07/09/2013 1144   PROT 7.6 12/22/2019 1015   PROT 8.0 03/03/2019 0934   PROT 7.8 07/09/2013 1144   ALBUMIN 4.1 12/22/2019 1015   ALBUMIN 4.7 03/03/2019 0934   ALBUMIN 3.7 07/09/2013 1144   AST 18 12/22/2019 1015   AST 9 07/09/2013 1144   ALT 15 12/22/2019  1015   ALT <6 07/09/2013 1144   ALKPHOS 86 12/22/2019 1015   ALKPHOS 58 07/09/2013 1144   BILITOT 0.7 12/22/2019 1015   BILITOT 0.40 07/09/2013 1144   GFRNONAA >60 12/22/2019 1015   GFRNONAA 74 02/01/2015 1238   GFRAA >60 12/22/2019 1015   GFRAA 85 02/01/2015 1238    No results found for: SPEP, UPEP  Lab Results  Component Value Date   WBC 4.8 12/22/2019   NEUTROABS 3.4 12/22/2019   HGB 13.6 12/22/2019   HCT 38.8 12/22/2019   MCV 90.9 12/22/2019   PLT 156 12/22/2019      Chemistry      Component Value Date/Time   NA 140 12/22/2019 1015   NA 139 03/03/2019 0934   NA 142 07/09/2013 1144   K 3.9 12/22/2019 1015   K 3.7 07/09/2013 1144   CL 106 12/22/2019 1015   CO2 25 12/22/2019 1015   CO2 23 07/09/2013 1144   BUN 17 12/22/2019 1015   BUN 25 03/03/2019 0934   BUN 14.4 07/09/2013 1144   CREATININE 0.81 12/22/2019 1015   CREATININE 0.82 01/02/2017 1159   CREATININE 0.8 07/09/2013 1144      Component Value Date/Time   CALCIUM 9.4 12/22/2019 1015   CALCIUM 9.3 07/09/2013 1144   ALKPHOS 86 12/22/2019 1015   ALKPHOS 58  07/09/2013 1144   AST 18 12/22/2019 1015   AST 9 07/09/2013 1144   ALT 15 12/22/2019 1015   ALT <6 07/09/2013 1144   BILITOT 0.7 12/22/2019 1015   BILITOT 0.40 07/09/2013 1144

## 2019-12-22 NOTE — Assessment & Plan Note (Signed)
She tolerated pembrolizumab well without major side effects We will proceed with treatment as scheduled I told her and her son that it would take several months before repeat imaging study after she completed radiation treatment

## 2019-12-22 NOTE — Patient Instructions (Signed)
[facility] Discharge Instructions for Patients Receiving Chemotherapy  Today you received the following chemotherapy agents:  Keytruda.  To help prevent nausea and vomiting after your treatment, we encourage you to take your nausea medication as directed.   If you develop nausea and vomiting that is not controlled by your nausea medication, call the clinic.   BELOW ARE SYMPTOMS THAT SHOULD BE REPORTED IMMEDIATELY:  *FEVER GREATER THAN 100.5 F  *CHILLS WITH OR WITHOUT FEVER  NAUSEA AND VOMITING THAT IS NOT CONTROLLED WITH YOUR NAUSEA MEDICATION  *UNUSUAL SHORTNESS OF BREATH  *UNUSUAL BRUISING OR BLEEDING  TENDERNESS IN MOUTH AND THROAT WITH OR WITHOUT PRESENCE OF ULCERS  *URINARY PROBLEMS  *BOWEL PROBLEMS  UNUSUAL RASH Items with * indicate a potential emergency and should be followed up as soon as possible.  Feel free to call the clinic should you have any questions or concerns. The clinic phone number is [phone].  Please show the CHEMO ALERT CARD at check-in to the Emergency Department and triage nurse.    

## 2019-12-23 ENCOUNTER — Encounter: Payer: Self-pay | Admitting: Radiation Oncology

## 2019-12-23 ENCOUNTER — Other Ambulatory Visit: Payer: Self-pay

## 2019-12-23 ENCOUNTER — Ambulatory Visit
Admission: RE | Admit: 2019-12-23 | Discharge: 2019-12-23 | Disposition: A | Discharge status: Home / Self Care | Payer: Medicare Other | Source: Ambulatory Visit | Attending: Radiation Oncology | Admitting: Radiation Oncology

## 2019-12-23 DIAGNOSIS — Z510 Encounter for antineoplastic radiation therapy: Secondary | ICD-10-CM | POA: Diagnosis not present

## 2020-01-07 NOTE — Progress Notes (Signed)
Pharmacist Chemotherapy Monitoring - Follow Up Assessment    I verify that I have reviewed each item in the below checklist:  . Regimen for the patient is scheduled for the appropriate day and plan matches scheduled date. Marland Kitchen Appropriate non-routine labs are ordered dependent on drug ordered. . If applicable, additional medications reviewed and ordered per protocol based on lifetime cumulative doses and/or treatment regimen.   Plan for follow-up and/or issues identified: No . I-vent associated with next due treatment: No . MD and/or nursing notified: No   Kennith Center, Pharm.D., CPP 01/07/2020@9 :37 AM

## 2020-01-08 ENCOUNTER — Telehealth: Payer: Self-pay

## 2020-01-08 NOTE — Telephone Encounter (Signed)
She called to get times of appt on 3/30. Times given. She verbalized understanding.

## 2020-01-12 ENCOUNTER — Inpatient Hospital Stay: Payer: Medicare Other | Admitting: Hematology and Oncology

## 2020-01-12 ENCOUNTER — Encounter: Payer: Self-pay | Admitting: Hematology and Oncology

## 2020-01-12 ENCOUNTER — Inpatient Hospital Stay: Payer: Medicare Other

## 2020-01-12 ENCOUNTER — Other Ambulatory Visit: Payer: Self-pay

## 2020-01-12 ENCOUNTER — Other Ambulatory Visit: Payer: Self-pay | Admitting: Hematology and Oncology

## 2020-01-12 VITALS — BP 159/86

## 2020-01-12 DIAGNOSIS — K5909 Other constipation: Secondary | ICD-10-CM

## 2020-01-12 DIAGNOSIS — I10 Essential (primary) hypertension: Secondary | ICD-10-CM | POA: Insufficient documentation

## 2020-01-12 DIAGNOSIS — C541 Malignant neoplasm of endometrium: Secondary | ICD-10-CM

## 2020-01-12 DIAGNOSIS — Z5112 Encounter for antineoplastic immunotherapy: Secondary | ICD-10-CM | POA: Diagnosis not present

## 2020-01-12 DIAGNOSIS — Z7189 Other specified counseling: Secondary | ICD-10-CM

## 2020-01-12 LAB — CMP (CANCER CENTER ONLY)
ALT: 15 U/L (ref 0–44)
AST: 17 U/L (ref 15–41)
Albumin: 4.1 g/dL (ref 3.5–5.0)
Alkaline Phosphatase: 97 U/L (ref 38–126)
Anion gap: 12 (ref 5–15)
BUN: 12 mg/dL (ref 8–23)
CO2: 22 mmol/L (ref 22–32)
Calcium: 9.3 mg/dL (ref 8.9–10.3)
Chloride: 107 mmol/L (ref 98–111)
Creatinine: 0.8 mg/dL (ref 0.44–1.00)
GFR, Est AFR Am: >60 mL/min (ref >60)
GFR, Estimated: >60 mL/min (ref >60)
Glucose, Bld: 120 mg/dL — ABNORMAL HIGH (ref 70–99)
Potassium: 3.5 mmol/L (ref 3.5–5.1)
Sodium: 141 mmol/L (ref 135–145)
Total Bilirubin: 0.6 mg/dL (ref 0.3–1.2)
Total Protein: 8 g/dL (ref 6.5–8.1)

## 2020-01-12 LAB — CBC WITH DIFFERENTIAL (CANCER CENTER ONLY)
Abs Immature Granulocytes: 0.01 10*3/uL (ref 0.00–0.07)
Basophils Absolute: 0 10*3/uL (ref 0.0–0.1)
Basophils Relative: 1 %
Eosinophils Absolute: 0.2 10*3/uL (ref 0.0–0.5)
Eosinophils Relative: 3 %
HCT: 39.8 % (ref 36.0–46.0)
Hemoglobin: 13.8 g/dL (ref 12.0–15.0)
Immature Granulocytes: 0 %
Lymphocytes Relative: 18 %
Lymphs Abs: 0.9 10*3/uL (ref 0.7–4.0)
MCH: 31.5 pg (ref 26.0–34.0)
MCHC: 34.7 g/dL (ref 30.0–36.0)
MCV: 90.9 fL (ref 80.0–100.0)
Monocytes Absolute: 0.5 10*3/uL (ref 0.1–1.0)
Monocytes Relative: 9 %
Neutro Abs: 3.5 10*3/uL (ref 1.7–7.7)
Neutrophils Relative %: 69 %
Platelet Count: 155 10*3/uL (ref 150–400)
RBC: 4.38 MIL/uL (ref 3.87–5.11)
RDW: 13 % (ref 11.5–15.5)
WBC Count: 5 10*3/uL (ref 4.0–10.5)
nRBC: 0 % (ref 0.0–0.2)

## 2020-01-12 LAB — TSH: TSH: 1.629 u[IU]/mL (ref 0.308–3.960)

## 2020-01-12 MED ORDER — SODIUM CHLORIDE 0.9% FLUSH
10.0000 mL | INTRAVENOUS | Status: DC | PRN
  Filled 2020-01-12: qty 10

## 2020-01-12 MED ORDER — SODIUM CHLORIDE 0.9 % IV SOLN
200.0000 mg | Freq: Once | INTRAVENOUS | Status: AC
  Administered 2020-01-12: 10:00:00 200 mg via INTRAVENOUS
  Filled 2020-01-12: qty 8

## 2020-01-12 MED ORDER — HEPARIN SOD (PORK) LOCK FLUSH 100 UNIT/ML IV SOLN
500.0000 [IU] | Freq: Once | INTRAVENOUS | Status: DC | PRN
  Filled 2020-01-12: qty 5

## 2020-01-12 MED ORDER — SODIUM CHLORIDE 0.9 % IV SOLN
Freq: Once | INTRAVENOUS | Status: AC
  Filled 2020-01-12: qty 250

## 2020-01-12 NOTE — Assessment & Plan Note (Signed)
She tolerated pembrolizumab well without major side effects We will proceed with treatment as scheduled I recommend repeat PET scan approximately 8 weeks from her last dose of radiation, probably around first week of May I will see her again in 3 weeks for further follow-up and at that time, we will plan the next imaging study

## 2020-01-12 NOTE — Assessment & Plan Note (Signed)
I recommend her to take MiraLAX on a regular basis to avoid severe constipation

## 2020-01-12 NOTE — Assessment & Plan Note (Signed)
She is noted to have elevated blood pressure It could be compounded by anxiety and blood draw She will continue her current blood pressure regimen and follow-up with primary care doctor for medication adjustment

## 2020-01-12 NOTE — Progress Notes (Signed)
[facility] OFFICE PROGRESS NOTE  Patient Care Team: Buzzy Han, MD as PCP - General (Family Medicine)  ASSESSMENT & PLAN:  Endometrial cancer [facility]) She tolerated pembrolizumab well without major side effects We will proceed with treatment as scheduled I recommend repeat PET scan approximately 8 weeks from her last dose of radiation, probably around first week of May I will see her again in 3 weeks for further follow-up and at that time, we will plan the next imaging study  Essential hypertension She is noted to have elevated blood pressure It could be compounded by anxiety and blood draw She will continue her current blood pressure regimen and follow-up with primary care doctor for medication adjustment  Other constipation I recommend her to take MiraLAX on a regular basis to avoid severe constipation   No orders of the defined types were placed in this encounter.   All questions were answered. The patient knows to call the clinic with any problems, questions or concerns. The total time spent in the appointment was 20 minutes encounter with patients including review of chart and various tests results, discussions about plan of care and coordination of care plan   [name], MD 01/12/2020 9:29 AM  INTERVAL HISTORY: Please see below for problem oriented charting. She returns with her son for further follow-up She is doing well She has completed Covid vaccination without difficulties No recent falls at home No infusion reaction She denies abdominal pain, nausea or changes in bowel habits She did have one episode of constipation but that has resolved with MiraLAX  SUMMARY OF ONCOLOGIC HISTORY: Oncology History Overview Note  MMR - Abnormal   Endometrial cancer ([place])  06/01/2013 Initial Diagnosis   Endometrial cancer   06/01/2013 Initial Biopsy   EMB for AUB Endometrium, biopsy - POSITIVE FOR ENDOMETRIAL ADENOCARCINOMA, SEE  COMMENT. Microscopic Comment As sampled the endometrial adenocarcinoma appears to be endometrioid type, FIGO Grade I.   07/21/2013 Surgery   PREOPERATIVE DIAGNOSIS: Grade 1 endometrial carcinoma. Uterine fibroids    PROCEDURE: Total abdominal hysterectomy, bilateral salpingo-oophorectomy, pelvic lymphadenectomy   SURGEON: [name], M.D  SURGICAL FINDINGS: At the time of exploratory laparotomy the uterus was approximately 16-18 weeks size with multiple subserosal fibroids. Tubes and ovaries were normal. On frozen section patient had endometrial carcinoma invading only the inner half of the myometrium. Exploration the upper abdomen revealed no evidence of metastatic disease. There was no pelvic or para-aortic lymphadenopathy.     07/21/2013 Pathologic Stage   1. Uterus +/- tubes/ovaries, neoplastic - INVASIVE ENDOMETRIOID CARCINOMA, FIGO GRADE II, WITH SQUAMOUS DIFFERENTIATION, CONFINED WITHIN INNER HALF OF THE MYOMETRIUM. - LEIOMYOMATA AND ADENOMYOSIS. - CERVIX: BENIGN SQUAMOUS MUCOSA AND ENDOCERVICAL MUCOSA, NO DYSPLASIA OR MALIGNANCY. - BILATERAL OVARIES AND FALLOPIAN TUBES: NO PATHOLOGIC ABNORMALITIES. 2. Lymph nodes, regional resection, left pelvic - FOUR LYMPH NODES, NO EVIDENCE OF METASTATIC CARCINOMA (0/4). 3. Lymph nodes, regional resection, right pelvic - THREE LYMPH NODES, NEGATIVE FOR METASTATIC CARCINOMA (0/3). Microscopic Comment 1. ONCOLOGY TABLE - UTERUS, CARCINOMA OR CARCINOSARCOMA Specimen: Uterus, cervix, bilateral ovaries and fallopian tubes Procedure: Total hysterectomy and bilateral salpingo-oophorectomy Lymph node sampling performed: Yes Specimen integrity: Intact Maximum tumor size (cm): 2.4 cm, gross measurement Histologic type: Invasive endometrioid carcinoma with squamous differentiation Grade: FIGO II Myometrial invasion: 1.1 cm where myometrium is 3.7 cm in thickness Cervical stromal involvement: Not identified Extent of involvement of other  organs: No Lymph vascular invasion: Not identified Peritoneal washings: Negative (Please correlate with [id number]) Lymph nodes: number examined 7;  number positive 0 Pelvic lymph nodes: 0 involved of 7 lymph nodes Para-aortic lymph nodes: N/A TNM code: pT1a, pN0 FIGO Stage (based on pathologic findings, needs clinical correlation): IA   09/22/2019 Imaging   CT A/P: There is a left paraspinous mass which is poorly defined and low-density measuring 3 x 2.2 by 5.5 cm. Deviates the left ureter laterally. No additional periaortic masses are identified.   10/13/2019 Pathology Results   A. LYMPH NODE, LEFT PARASPINAL, NEEDLE CORE BIOPSY: - Poorly differentiated carcinoma. COMMENT: Poorly differentiated carcinoma is present in the background of a majority of necrosis and fibrosis tissue. No distinct nodal tissue is identified. Immunohistochemistry for CK7, PAX 8 and ER is positive. CK20, TTF-1, CDX-2 and GATA-3 are negative. The immunophenotype is compatible with the provided clinical history of gynecologic cancer.    Genetic Testing   Patient has genetic testing done for MMR. Results revealed patient has the following mutation(s): MMR - Abnormal   10/27/2019 PET scan   1. Poorly marginated hypermetabolic nodal conglomerate in the left para-aortic and left common iliac chains, compatible with nodal metastatic disease. 2. No additional hypermetabolic sites of metastatic disease. 3. Right upper lobe 2 mm solid pulmonary nodule, below PET resolution, recommend attention on follow-up chest CT in 3-6 months. 4. Chronic findings include: Aortic Atherosclerosis (ICD10-I70.0). Coronary atherosclerosis. Mild left colonic diverticulosis. Mild biliary and pancreatic duct dilation of uncertain etiology, unchanged since 09/22/2019 CT.   11/10/2019 -  Chemotherapy   The patient had pembrolizumab (KEYTRUDA) 200 mg in sodium chloride 0.9 % 50 mL chemo infusion, 200 mg, Intravenous, Once, 3 of 6  cycles Administration: 200 mg (11/10/2019), 200 mg (12/22/2019), 200 mg (12/01/2019)  for chemotherapy treatment.      REVIEW OF SYSTEMS:   Constitutional: Denies fevers, chills or abnormal weight loss Eyes: Denies blurriness of vision Ears, nose, mouth, throat, and face: Denies mucositis or sore throat Respiratory: Denies cough, dyspnea or wheezes Cardiovascular: Denies palpitation, chest discomfort or lower extremity swelling Gastrointestinal:  Denies nausea, heartburn or change in bowel habits Skin: Denies abnormal skin rashes Lymphatics: Denies new lymphadenopathy or easy bruising Neurological:Denies numbness, tingling or new weaknesses Behavioral/Psych: Mood is stable, no new changes  All other systems were reviewed with the patient and are negative.  I have reviewed the past medical history, past surgical history, social history and family history with the patient and they are unchanged from previous note.  ALLERGIES:  is allergic to iron; iron dextran; penicillins; amoxicillin; ampicillin; codeine; diamox [acetazolamide]; penicillin g; and other.  MEDICATIONS:  Current Outpatient Medications  Medication Sig Dispense Refill  . amLODipine (NORVASC) 10 MG tablet Take by mouth.    Marland Kitchen atorvastatin (LIPITOR) 20 MG tablet Take 1 tablet (20 mg total) by mouth daily. 30 tablet 3  . brimonidine (ALPHAGAN) 0.2 % ophthalmic solution     . busPIRone (BUSPAR) 7.5 MG tablet Take 7.5 mg by mouth 2 (two) times daily.    . dorzolamide-timolol (COSOPT) 22.3-6.8 MG/ML ophthalmic solution Place 1 drop into the left eye 2 (two) times daily.    . hydrochlorothiazide (HYDRODIURIL) 25 MG tablet Take 1 tablet (25 mg total) by mouth daily. Discontinue amlodipine (Patient not taking: Reported on 03/11/2019) 90 tablet 1  . lisinopril (ZESTRIL) 40 MG tablet Take 1 tablet by mouth once daily 90 tablet 0  . magnesium 30 MG tablet Take by mouth daily.    . metoprolol tartrate (LOPRESSOR) 25 MG tablet Take 1 tablet  (25 mg total) by mouth 2 (two) times  daily. 180 tablet 1  . Netarsudil Dimesylate 0.02 % SOLN Apply to eye.    . ondansetron (ZOFRAN) 8 MG tablet Take 1 tablet (8 mg total) by mouth every 8 (eight) hours as needed for nausea. 30 tablet 3  . prochlorperazine (COMPAZINE) 10 MG tablet Take 1 tablet (10 mg total) by mouth every 6 (six) hours as needed for nausea or vomiting. 30 tablet 0  . VYZULTA 0.024 % SOLN      No current facility-administered medications for this visit.    PHYSICAL EXAMINATION: ECOG PERFORMANCE STATUS: 1 - Symptomatic but completely ambulatory Blood pressure is 177/90 Temperature 97.8 Heart rate 91 Respiration rate 18 Weight 156.8 GENERAL:alert, no distress and comfortable SKIN: skin color, texture, turgor are normal, no rashes or significant lesions EYES: normal, Conjunctiva are pink and non-injected, sclera clear OROPHARYNX:no exudate, no erythema and lips, buccal mucosa, and tongue normal  NECK: supple, thyroid normal size, non-tender, without nodularity LYMPH:  no palpable lymphadenopathy in the cervical, axillary or inguinal LUNGS: clear to auscultation and percussion with normal breathing effort HEART: regular rate & rhythm and no murmurs and no lower extremity edema ABDOMEN:abdomen soft, non-tender and normal bowel sounds Musculoskeletal:no cyanosis of digits and no clubbing  NEURO: alert & oriented x 3 with fluent speech, no focal motor/sensory deficits  LABORATORY DATA:  I have reviewed the data as listed    Component Value Date/Time   NA 140 12/22/2019 1015   NA 139 03/03/2019 0934   NA 142 07/09/2013 1144   K 3.9 12/22/2019 1015   K 3.7 07/09/2013 1144   CL 106 12/22/2019 1015   CO2 25 12/22/2019 1015   CO2 23 07/09/2013 1144   GLUCOSE 121 (H) 12/22/2019 1015   GLUCOSE 115 07/09/2013 1144   BUN 17 12/22/2019 1015   BUN 25 03/03/2019 0934   BUN 14.4 07/09/2013 1144   CREATININE 0.81 12/22/2019 1015   CREATININE 0.82 01/02/2017 1159    CREATININE 0.8 07/09/2013 1144   CALCIUM 9.4 12/22/2019 1015   CALCIUM 9.3 07/09/2013 1144   PROT 7.6 12/22/2019 1015   PROT 8.0 03/03/2019 0934   PROT 7.8 07/09/2013 1144   ALBUMIN 4.1 12/22/2019 1015   ALBUMIN 4.7 03/03/2019 0934   ALBUMIN 3.7 07/09/2013 1144   AST 18 12/22/2019 1015   AST 9 07/09/2013 1144   ALT 15 12/22/2019 1015   ALT <6 07/09/2013 1144   ALKPHOS 86 12/22/2019 1015   ALKPHOS 58 07/09/2013 1144   BILITOT 0.7 12/22/2019 1015   BILITOT 0.40 07/09/2013 1144   GFRNONAA >60 12/22/2019 1015   GFRNONAA 74 02/01/2015 1238   GFRAA >60 12/22/2019 1015   GFRAA 85 02/01/2015 1238    No results found for: SPEP, UPEP  Lab Results  Component Value Date   WBC 5.0 01/12/2020   NEUTROABS 3.5 01/12/2020   HGB 13.8 01/12/2020   HCT 39.8 01/12/2020   MCV 90.9 01/12/2020   PLT 155 01/12/2020      Chemistry      Component Value Date/Time   NA 140 12/22/2019 1015   NA 139 03/03/2019 0934   NA 142 07/09/2013 1144   K 3.9 12/22/2019 1015   K 3.7 07/09/2013 1144   CL 106 12/22/2019 1015   CO2 25 12/22/2019 1015   CO2 23 07/09/2013 1144   BUN 17 12/22/2019 1015   BUN 25 03/03/2019 0934   BUN 14.4 07/09/2013 1144   CREATININE 0.81 12/22/2019 1015   CREATININE 0.82 01/02/2017 1159   CREATININE  0.8 07/09/2013 1144      Component Value Date/Time   CALCIUM 9.4 12/22/2019 1015   CALCIUM 9.3 07/09/2013 1144   ALKPHOS 86 12/22/2019 1015   ALKPHOS 58 07/09/2013 1144   AST 18 12/22/2019 1015   AST 9 07/09/2013 1144   ALT 15 12/22/2019 1015   ALT <6 07/09/2013 1144   BILITOT 0.7 12/22/2019 1015   BILITOT 0.40 07/09/2013 1144

## 2020-01-25 ENCOUNTER — Encounter: Payer: Self-pay | Admitting: Radiation Oncology

## 2020-01-25 ENCOUNTER — Other Ambulatory Visit: Payer: Self-pay

## 2020-01-25 ENCOUNTER — Ambulatory Visit
Admission: RE | Admit: 2020-01-25 | Discharge: 2020-01-25 | Disposition: A | Discharge status: Home / Self Care | Payer: Medicare Other | Source: Ambulatory Visit | Attending: Radiation Oncology | Admitting: Radiation Oncology

## 2020-01-25 VITALS — HR 94 | Temp 98.3°F | Resp 18 | Ht 66.0 in | Wt 155.2 lb

## 2020-01-25 DIAGNOSIS — C541 Malignant neoplasm of endometrium: Secondary | ICD-10-CM

## 2020-01-25 NOTE — Progress Notes (Signed)
Patient here for follow-up appointment.  She denies pain, nausea, vomiting, or diarrhea.  She states she has a little more constipation and is taking miralax.  She states her appetite is good, drinking fluids well.  Vitals and weight are stable.  Pulse 94   Temp 98.3 F (36.8 C) (Temporal)   Resp 18   Ht 5\' 6"  (1.676 m)   Wt 155 lb 3.2 oz (70.4 kg)   SpO2 100%   BMI 25.05 kg/m    Wt Readings from Last 3 Encounters:  01/25/20 155 lb 3.2 oz (70.4 kg)  01/12/20 156 lb 12.8 oz (71.1 kg)  12/22/19 152 lb 6.4 oz (69.1 kg)   Virdell Hoiland M. Leonie Green, BSN

## 2020-01-25 NOTE — Progress Notes (Signed)
Radiation Oncology         [phone] ________________________________  Name: Stephanie [name] MRN: [id number]  Date: 01/25/2020  DOB: 01-24-1946  Follow-Up Visit Note  CC: Buzzy Han, MD  Buzzy Han*    ICD-10-CM   1. Endometrial cancer ([place])  C54.1     Diagnosis: Recurrent stage IA grade 2 endometrial carcinoma  Interval Since Last Radiation: One month and two days   11/10/2019 - 12/23/2019; 54 Gy in 30 fractions directed at the abdomen  Narrative:  The patient returns today for routine follow-up. Since the end of treatment, the patient was seen by Dr. Alvy Bimler on 01/12/2020. She is to continue treatment with Pembrolizumab as scheduled. Repeat PET scan was recommended for approximately six weeks from now.  On review of systems, she reports constipation but has been using MiraLAX. She denies pain, nausea, vomiting, and diarrhea.  ALLERGIES:  is allergic to iron; iron dextran; penicillins; amoxicillin; ampicillin; codeine; diamox [acetazolamide]; penicillin g; and other.  Meds: Current Outpatient Medications  Medication Sig Dispense Refill  . amLODipine (NORVASC) 10 MG tablet Take by mouth.    Marland Kitchen atorvastatin (LIPITOR) 20 MG tablet Take 1 tablet (20 mg total) by mouth daily. 30 tablet 3  . brimonidine (ALPHAGAN) 0.2 % ophthalmic solution     . busPIRone (BUSPAR) 7.5 MG tablet Take 7.5 mg by mouth 2 (two) times daily.    . dorzolamide-timolol (COSOPT) 22.3-6.8 MG/ML ophthalmic solution Place 1 drop into the left eye 2 (two) times daily.    Marland Kitchen latanoprost (XALATAN) 0.005 % ophthalmic solution Apply to eye.    Marland Kitchen lisinopril (ZESTRIL) 40 MG tablet Take 1 tablet by mouth once daily 90 tablet 0  . magnesium 30 MG tablet Take by mouth daily.    . metoprolol tartrate (LOPRESSOR) 25 MG tablet Take 1 tablet (25 mg total) by mouth 2 (two) times daily. 180 tablet 1  . Netarsudil Dimesylate 0.02 % SOLN Apply to eye.    . ondansetron (ZOFRAN) 8 MG tablet Take 1  tablet (8 mg total) by mouth every 8 (eight) hours as needed for nausea. 30 tablet 3  . prednisoLONE acetate (PRED FORTE) 1 % ophthalmic suspension Apply to eye.    . prochlorperazine (COMPAZINE) 10 MG tablet Take 1 tablet (10 mg total) by mouth every 6 (six) hours as needed for nausea or vomiting. 30 tablet 0  . VYZULTA 0.024 % SOLN     . hydrochlorothiazide (HYDRODIURIL) 25 MG tablet Take 1 tablet (25 mg total) by mouth daily. Discontinue amlodipine (Patient not taking: Reported on 01/25/2020) 90 tablet 1   No current facility-administered medications for this encounter.    Physical Findings: The patient is in no acute distress. Patient is alert and oriented.  height is 5\' 6"  (1.676 m) and weight is 155 lb 3.2 oz (70.4 kg). Her temporal temperature is 98.3 F (36.8 C). Her pulse is 94. Her respiration is 18 and oxygen saturation is 100%.   No significant changes. Lungs are clear to auscultation bilaterally. Heart has regular rate and rhythm. No palpable cervical, supraclavicular, or axillary adenopathy. Abdomen soft, non-tender, normal bowel sounds.   Lab Findings: Lab Results  Component Value Date   WBC 5.0 01/12/2020   HGB 13.8 01/12/2020   HCT 39.8 01/12/2020   MCV 90.9 01/12/2020   PLT 155 01/12/2020    Radiographic Findings: No results found.  Impression: Recurrent stage IA grade 2 endometrial carcinoma  The patient and her husband report no significant side effects of  her radiation therapy  Plan: Routine follow-up with radiation oncology in three months. The patient is scheduled to see Dr. Alvy Bimler on 02/02/2020, during which time PET scan will be scheduled.  ____________________________________   Blair Promise, PhD, MD  This document serves as a record of services personally performed by Gery Pray, MD. It was created on his behalf by Clerance Lav, a trained medical scribe. The creation of this record is based on the scribe's personal observations and the provider's  statements to them. This document has been checked and approved by the attending provider.

## 2020-01-26 ENCOUNTER — Telehealth: Payer: Self-pay | Admitting: *Deleted

## 2020-01-26 NOTE — Telephone Encounter (Signed)
CALLED PATIENT TO ASK ABOUT COMING IN FOR A THREE MONTH FU WITH DR. KINARD ON 04-25-20 @ 3:45 PM, PATIENT AGREED TO DATE AND TIME

## 2020-01-27 NOTE — Progress Notes (Signed)
Pharmacist Chemotherapy Monitoring - Follow Up Assessment    I verify that I have reviewed each item in the below checklist:  . Regimen for the patient is scheduled for the appropriate day and plan matches scheduled date. Marland Kitchen Appropriate non-routine labs are ordered dependent on drug ordered. . If applicable, additional medications reviewed and ordered per protocol based on lifetime cumulative doses and/or treatment regimen.   Plan for follow-up and/or issues identified: No . I-vent associated with next due treatment: No . MD and/or nursing notified: No  Acquanetta Belling 01/27/2020 12:06 PM

## 2020-02-02 ENCOUNTER — Inpatient Hospital Stay: Payer: Medicare Other | Attending: Gynecologic Oncology

## 2020-02-02 ENCOUNTER — Encounter: Payer: Self-pay | Admitting: Hematology and Oncology

## 2020-02-02 ENCOUNTER — Other Ambulatory Visit: Payer: Self-pay

## 2020-02-02 ENCOUNTER — Inpatient Hospital Stay: Payer: Medicare Other | Admitting: Hematology and Oncology

## 2020-02-02 ENCOUNTER — Inpatient Hospital Stay: Payer: Medicare Other

## 2020-02-02 VITALS — BP 167/87 | HR 88 | Temp 98.3°F | Resp 18 | Ht 66.0 in | Wt 156.4 lb

## 2020-02-02 DIAGNOSIS — C541 Malignant neoplasm of endometrium: Secondary | ICD-10-CM

## 2020-02-02 DIAGNOSIS — I10 Essential (primary) hypertension: Secondary | ICD-10-CM

## 2020-02-02 DIAGNOSIS — K5909 Other constipation: Secondary | ICD-10-CM | POA: Insufficient documentation

## 2020-02-02 DIAGNOSIS — Z79899 Other long term (current) drug therapy: Secondary | ICD-10-CM | POA: Diagnosis not present

## 2020-02-02 DIAGNOSIS — Z7189 Other specified counseling: Secondary | ICD-10-CM

## 2020-02-02 DIAGNOSIS — Z5112 Encounter for antineoplastic immunotherapy: Secondary | ICD-10-CM | POA: Insufficient documentation

## 2020-02-02 DIAGNOSIS — R030 Elevated blood-pressure reading, without diagnosis of hypertension: Secondary | ICD-10-CM | POA: Insufficient documentation

## 2020-02-02 LAB — CBC WITH DIFFERENTIAL (CANCER CENTER ONLY)
Abs Immature Granulocytes: 0.01 10*3/uL (ref 0.00–0.07)
Basophils Absolute: 0 10*3/uL (ref 0.0–0.1)
Basophils Relative: 0 %
Eosinophils Absolute: 0.1 10*3/uL (ref 0.0–0.5)
Eosinophils Relative: 3 %
HCT: 40.3 % (ref 36.0–46.0)
Hemoglobin: 13.9 g/dL (ref 12.0–15.0)
Immature Granulocytes: 0 %
Lymphocytes Relative: 20 %
Lymphs Abs: 0.9 10*3/uL (ref 0.7–4.0)
MCH: 31.6 pg (ref 26.0–34.0)
MCHC: 34.5 g/dL (ref 30.0–36.0)
MCV: 91.6 fL (ref 80.0–100.0)
Monocytes Absolute: 0.4 10*3/uL (ref 0.1–1.0)
Monocytes Relative: 8 %
Neutro Abs: 3.3 10*3/uL (ref 1.7–7.7)
Neutrophils Relative %: 69 %
Platelet Count: 157 10*3/uL (ref 150–400)
RBC: 4.4 MIL/uL (ref 3.87–5.11)
RDW: 12.8 % (ref 11.5–15.5)
WBC Count: 4.7 10*3/uL (ref 4.0–10.5)
nRBC: 0 % (ref 0.0–0.2)

## 2020-02-02 LAB — CMP (CANCER CENTER ONLY)
ALT: 15 U/L (ref 0–44)
AST: 16 U/L (ref 15–41)
Albumin: 4.1 g/dL (ref 3.5–5.0)
Alkaline Phosphatase: 96 U/L (ref 38–126)
Anion gap: 11 (ref 5–15)
BUN: 21 mg/dL (ref 8–23)
CO2: 21 mmol/L — ABNORMAL LOW (ref 22–32)
Calcium: 9.3 mg/dL (ref 8.9–10.3)
Chloride: 109 mmol/L (ref 98–111)
Creatinine: 0.82 mg/dL (ref 0.44–1.00)
GFR, Est AFR Am: >60 mL/min (ref >60)
GFR, Estimated: >60 mL/min (ref >60)
Glucose, Bld: 101 mg/dL — ABNORMAL HIGH (ref 70–99)
Potassium: 3.9 mmol/L (ref 3.5–5.1)
Sodium: 141 mmol/L (ref 135–145)
Total Bilirubin: 0.7 mg/dL (ref 0.3–1.2)
Total Protein: 7.8 g/dL (ref 6.5–8.1)

## 2020-02-02 LAB — TSH: TSH: 1.514 u[IU]/mL (ref 0.308–3.960)

## 2020-02-02 MED ORDER — SODIUM CHLORIDE 0.9 % IV SOLN
200.0000 mg | Freq: Once | INTRAVENOUS | Status: AC
  Administered 2020-02-02: 200 mg via INTRAVENOUS
  Filled 2020-02-02: qty 8

## 2020-02-02 MED ORDER — SODIUM CHLORIDE 0.9 % IV SOLN
Freq: Once | INTRAVENOUS | Status: AC
  Filled 2020-02-02: qty 250

## 2020-02-02 NOTE — Patient Instructions (Signed)
[facility] Discharge Instructions for Patients Receiving Chemotherapy  Today you received the following chemotherapy agents:  Keytruda.  To help prevent nausea and vomiting after your treatment, we encourage you to take your nausea medication as directed.   If you develop nausea and vomiting that is not controlled by your nausea medication, call the clinic.   BELOW ARE SYMPTOMS THAT SHOULD BE REPORTED IMMEDIATELY:  *FEVER GREATER THAN 100.5 F  *CHILLS WITH OR WITHOUT FEVER  NAUSEA AND VOMITING THAT IS NOT CONTROLLED WITH YOUR NAUSEA MEDICATION  *UNUSUAL SHORTNESS OF BREATH  *UNUSUAL BRUISING OR BLEEDING  TENDERNESS IN MOUTH AND THROAT WITH OR WITHOUT PRESENCE OF ULCERS  *URINARY PROBLEMS  *BOWEL PROBLEMS  UNUSUAL RASH Items with * indicate a potential emergency and should be followed up as soon as possible.  Feel free to call the clinic should you have any questions or concerns. The clinic phone number is [phone].  Please show the CHEMO ALERT CARD at check-in to the Emergency Department and triage nurse.    

## 2020-02-02 NOTE — Assessment & Plan Note (Signed)
She tolerated pembrolizumab well without major side effects We will proceed with treatment as scheduled I recommend repeat PET scan next month before I see her back

## 2020-02-02 NOTE — Progress Notes (Signed)
Stephanie [name] OFFICE PROGRESS NOTE  Patient Care Team: Buzzy Han, MD as PCP - General (Family Medicine)  ASSESSMENT & PLAN:  Endometrial cancer [facility]) She tolerated pembrolizumab well without major side effects We will proceed with treatment as scheduled I recommend repeat PET scan next month before I see her back   Essential hypertension She is noted to have elevated blood pressure It could be compounded by anxiety and blood draw Her blood pressure monitoring at home are actually within normal limits   Other constipation I recommend her to take MiraLAX on a regular basis to avoid severe constipation   Orders Placed This Encounter  Procedures  . NM PET Image Restag (PS) Skull Base To Thigh    Standing Status:   Future    Standing Expiration Date:   02/01/2021    Order Specific Question:   If indicated for the ordered procedure, I authorize the administration of a radiopharmaceutical per Radiology protocol    Answer:   Yes    Order Specific Question:   Preferred imaging location?    Answer:   [facility]    Order Specific Question:   Radiology Contrast Protocol - do NOT remove file path    Answer:   [email or web address]    All questions were answered. The patient knows to call the clinic with any problems, questions or concerns. The total time spent in the appointment was 20 minutes encounter with patients including review of chart and various tests results, discussions about plan of care and coordination of care plan   Stephanie Lark, MD 02/02/2020 10:24 AM  INTERVAL HISTORY: Please see below for problem oriented charting. She returns with her son for further follow-up She is doing well She tolerated treatment well except for some mild constipation Her blood pressure monitoring at home showed systolic blood pressures consistently under 703 and diastolic blood pressure under 90 She has no side effects of treatment so  far  SUMMARY OF ONCOLOGIC HISTORY: Oncology History Overview Note  MMR - Abnormal   Endometrial cancer ([place])  06/01/2013 Initial Diagnosis   Endometrial cancer   06/01/2013 Initial Biopsy   EMB for AUB Endometrium, biopsy - POSITIVE FOR ENDOMETRIAL ADENOCARCINOMA, SEE COMMENT. Microscopic Comment As sampled the endometrial adenocarcinoma appears to be endometrioid type, FIGO Grade I.   07/21/2013 Surgery   PREOPERATIVE DIAGNOSIS: Grade 1 endometrial carcinoma. Uterine fibroids    PROCEDURE: Total abdominal hysterectomy, bilateral salpingo-oophorectomy, pelvic lymphadenectomy   SURGEON: [name], M.D  SURGICAL FINDINGS: At the time of exploratory laparotomy the uterus was approximately 16-18 weeks size with multiple subserosal fibroids. Tubes and ovaries were normal. On frozen section patient had endometrial carcinoma invading only the inner half of the myometrium. Exploration the upper abdomen revealed no evidence of metastatic disease. There was no pelvic or para-aortic lymphadenopathy.     07/21/2013 Pathologic Stage   1. Uterus +/- tubes/ovaries, neoplastic - INVASIVE ENDOMETRIOID CARCINOMA, FIGO GRADE II, WITH SQUAMOUS DIFFERENTIATION, CONFINED WITHIN INNER HALF OF THE MYOMETRIUM. - LEIOMYOMATA AND ADENOMYOSIS. - CERVIX: BENIGN SQUAMOUS MUCOSA AND ENDOCERVICAL MUCOSA, NO DYSPLASIA OR MALIGNANCY. - BILATERAL OVARIES AND FALLOPIAN TUBES: NO PATHOLOGIC ABNORMALITIES. 2. Lymph nodes, regional resection, left pelvic - FOUR LYMPH NODES, NO EVIDENCE OF METASTATIC CARCINOMA (0/4). 3. Lymph nodes, regional resection, right pelvic - THREE LYMPH NODES, NEGATIVE FOR METASTATIC CARCINOMA (0/3). Microscopic Comment 1. ONCOLOGY TABLE - UTERUS, CARCINOMA OR CARCINOSARCOMA Specimen: Uterus, cervix, bilateral ovaries and fallopian tubes Procedure: Total hysterectomy and bilateral salpingo-oophorectomy Lymph node sampling performed:  Yes Specimen integrity: Intact Maximum tumor  size (cm): 2.4 cm, gross measurement Histologic type: Invasive endometrioid carcinoma with squamous differentiation Grade: FIGO II Myometrial invasion: 1.1 cm where myometrium is 3.7 cm in thickness Cervical stromal involvement: Not identified Extent of involvement of other organs: No Lymph vascular invasion: Not identified Peritoneal washings: Negative (Please correlate with [id number]) Lymph nodes: number examined 7; number positive 0 Pelvic lymph nodes: 0 involved of 7 lymph nodes Para-aortic lymph nodes: N/A TNM code: pT1a, pN0 FIGO Stage (based on pathologic findings, needs clinical correlation): IA   09/22/2019 Imaging   CT A/P: There is a left paraspinous mass which is poorly defined and low-density measuring 3 x 2.2 by 5.5 cm. Deviates the left ureter laterally. No additional periaortic masses are identified.   10/13/2019 Pathology Results   A. LYMPH NODE, LEFT PARASPINAL, NEEDLE CORE BIOPSY: - Poorly differentiated carcinoma. COMMENT: Poorly differentiated carcinoma is present in the background of a majority of necrosis and fibrosis tissue. No distinct nodal tissue is identified. Immunohistochemistry for CK7, PAX 8 and ER is positive. CK20, TTF-1, CDX-2 and GATA-3 are negative. The immunophenotype is compatible with the provided clinical history of gynecologic cancer.    Genetic Testing   Patient has genetic testing done for MMR. Results revealed patient has the following mutation(s): MMR - Abnormal   10/27/2019 PET scan   1. Poorly marginated hypermetabolic nodal conglomerate in the left para-aortic and left common iliac chains, compatible with nodal metastatic disease. 2. No additional hypermetabolic sites of metastatic disease. 3. Right upper lobe 2 mm solid pulmonary nodule, below PET resolution, recommend attention on follow-up chest CT in 3-6 months. 4. Chronic findings include: Aortic Atherosclerosis (ICD10-I70.0). Coronary atherosclerosis. Mild left colonic  diverticulosis. Mild biliary and pancreatic duct dilation of uncertain etiology, unchanged since 09/22/2019 CT.   11/10/2019 -  Chemotherapy   The patient had pembrolizumab (KEYTRUDA) 200 mg in sodium chloride 0.9 % 50 mL chemo infusion, 200 mg, Intravenous, Once, 5 of 6 cycles Administration: 200 mg (11/10/2019), 200 mg (12/22/2019), 200 mg (01/12/2020), 200 mg (12/01/2019)  for chemotherapy treatment.      REVIEW OF SYSTEMS:   Constitutional: Denies fevers, chills or abnormal weight loss Eyes: Denies blurriness of vision Ears, nose, mouth, throat, and face: Denies mucositis or sore throat Respiratory: Denies cough, dyspnea or wheezes Cardiovascular: Denies palpitation, chest discomfort or lower extremity swelling Skin: Denies abnormal skin rashes Lymphatics: Denies new lymphadenopathy or easy bruising Neurological:Denies numbness, tingling or new weaknesses Behavioral/Psych: Mood is stable, no new changes  All other systems were reviewed with the patient and are negative.  I have reviewed the past medical history, past surgical history, social history and family history with the patient and they are unchanged from previous note.  ALLERGIES:  is allergic to iron; iron dextran; penicillins; amoxicillin; ampicillin; codeine; diamox [acetazolamide]; penicillin g; and other.  MEDICATIONS:  Current Outpatient Medications  Medication Sig Dispense Refill  . amLODipine (NORVASC) 10 MG tablet Take by mouth.    Marland Kitchen atorvastatin (LIPITOR) 20 MG tablet Take 1 tablet (20 mg total) by mouth daily. 30 tablet 3  . brimonidine (ALPHAGAN) 0.2 % ophthalmic solution     . busPIRone (BUSPAR) 7.5 MG tablet Take 7.5 mg by mouth 2 (two) times daily.    . dorzolamide-timolol (COSOPT) 22.3-6.8 MG/ML ophthalmic solution Place 1 drop into the left eye 2 (two) times daily.    . hydrochlorothiazide (HYDRODIURIL) 25 MG tablet Take 1 tablet (25 mg total) by mouth daily. Discontinue amlodipine (Patient  not taking: Reported  on 01/25/2020) 90 tablet 1  . latanoprost (XALATAN) 0.005 % ophthalmic solution Apply to eye.    Marland Kitchen lisinopril (ZESTRIL) 40 MG tablet Take 1 tablet by mouth once daily 90 tablet 0  . magnesium 30 MG tablet Take by mouth daily.    . metoprolol tartrate (LOPRESSOR) 25 MG tablet Take 1 tablet (25 mg total) by mouth 2 (two) times daily. 180 tablet 1  . Netarsudil Dimesylate 0.02 % SOLN Apply to eye.    . ondansetron (ZOFRAN) 8 MG tablet Take 1 tablet (8 mg total) by mouth every 8 (eight) hours as needed for nausea. 30 tablet 3  . prednisoLONE acetate (PRED FORTE) 1 % ophthalmic suspension Apply to eye.    . prochlorperazine (COMPAZINE) 10 MG tablet Take 1 tablet (10 mg total) by mouth every 6 (six) hours as needed for nausea or vomiting. 30 tablet 0  . VYZULTA 0.024 % SOLN      No current facility-administered medications for this visit.   Facility-Administered Medications Ordered in Other Visits  Medication Dose Route Frequency Provider Last Rate Last Admin  . pembrolizumab (KEYTRUDA) 200 mg in sodium chloride 0.9 % 50 mL chemo infusion  200 mg Intravenous Once Alvy Bimler, Jarrod Bodkins, MD        PHYSICAL EXAMINATION: ECOG PERFORMANCE STATUS: 1 - Symptomatic but completely ambulatory  Vitals:   02/02/20 0908  BP: (!) 167/87  Pulse: 88  Resp: 18  Temp: 98.3 F (36.8 C)  SpO2: 100%   Filed Weights   02/02/20 0908  Weight: 156 lb 6.4 oz (70.9 kg)    GENERAL:alert, no distress and comfortable NEURO: alert & oriented x 3 with fluent speech, no focal motor/sensory deficits  LABORATORY DATA:  I have reviewed the data as listed    Component Value Date/Time   NA 141 02/02/2020 0836   NA 139 03/03/2019 0934   NA 142 07/09/2013 1144   K 3.9 02/02/2020 0836   K 3.7 07/09/2013 1144   CL 109 02/02/2020 0836   CO2 21 (L) 02/02/2020 0836   CO2 23 07/09/2013 1144   GLUCOSE 101 (H) 02/02/2020 0836   GLUCOSE 115 07/09/2013 1144   BUN 21 02/02/2020 0836   BUN 25 03/03/2019 0934   BUN 14.4 07/09/2013  1144   CREATININE 0.82 02/02/2020 0836   CREATININE 0.82 01/02/2017 1159   CREATININE 0.8 07/09/2013 1144   CALCIUM 9.3 02/02/2020 0836   CALCIUM 9.3 07/09/2013 1144   PROT 7.8 02/02/2020 0836   PROT 8.0 03/03/2019 0934   PROT 7.8 07/09/2013 1144   ALBUMIN 4.1 02/02/2020 0836   ALBUMIN 4.7 03/03/2019 0934   ALBUMIN 3.7 07/09/2013 1144   AST 16 02/02/2020 0836   AST 9 07/09/2013 1144   ALT 15 02/02/2020 0836   ALT <6 07/09/2013 1144   ALKPHOS 96 02/02/2020 0836   ALKPHOS 58 07/09/2013 1144   BILITOT 0.7 02/02/2020 0836   BILITOT 0.40 07/09/2013 1144   GFRNONAA >60 02/02/2020 0836   GFRNONAA 74 02/01/2015 1238   GFRAA >60 02/02/2020 0836   GFRAA 85 02/01/2015 1238    No results found for: SPEP, UPEP  Lab Results  Component Value Date   WBC 4.7 02/02/2020   NEUTROABS 3.3 02/02/2020   HGB 13.9 02/02/2020   HCT 40.3 02/02/2020   MCV 91.6 02/02/2020   PLT 157 02/02/2020      Chemistry      Component Value Date/Time   NA 141 02/02/2020 0836   NA 139  03/03/2019 0934   NA 142 07/09/2013 1144   K 3.9 02/02/2020 0836   K 3.7 07/09/2013 1144   CL 109 02/02/2020 0836   CO2 21 (L) 02/02/2020 0836   CO2 23 07/09/2013 1144   BUN 21 02/02/2020 0836   BUN 25 03/03/2019 0934   BUN 14.4 07/09/2013 1144   CREATININE 0.82 02/02/2020 0836   CREATININE 0.82 01/02/2017 1159   CREATININE 0.8 07/09/2013 1144      Component Value Date/Time   CALCIUM 9.3 02/02/2020 0836   CALCIUM 9.3 07/09/2013 1144   ALKPHOS 96 02/02/2020 0836   ALKPHOS 58 07/09/2013 1144   AST 16 02/02/2020 0836   AST 9 07/09/2013 1144   ALT 15 02/02/2020 0836   ALT <6 07/09/2013 1144   BILITOT 0.7 02/02/2020 0836   BILITOT 0.40 07/09/2013 1144

## 2020-02-02 NOTE — Assessment & Plan Note (Signed)
She is noted to have elevated blood pressure It could be compounded by anxiety and blood draw Her blood pressure monitoring at home are actually within normal limits  

## 2020-02-02 NOTE — Assessment & Plan Note (Signed)
I recommend her to take MiraLAX on a regular basis to avoid severe constipation

## 2020-02-03 ENCOUNTER — Telehealth: Payer: Self-pay | Admitting: Hematology and Oncology

## 2020-02-03 NOTE — Progress Notes (Signed)
  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 She did report some mild fatigue immediately following radiation therapy that resolved after taking a nap. Fatigue improved throughout treatment. She denied pain, nausea, vomiting, and diarrhea. Bowel movements were regular, appetite was good, and weight remained stable.  Plan: The patient will follow-up with radiation oncology in one month.  ________________________________________________   Blair Promise, PhD, MD  This document serves as a record of services personally performed by Gery Pray, MD. It was created on his behalf by Clerance Lav, a trained medical scribe. The creation of this record is based on the scribe's personal observations and the provider's statements to them. This document has been checked and approved by the attending provider.

## 2020-02-03 NOTE — Telephone Encounter (Signed)
Scheduled appts per 4/20 sch msg. Pt confirmed appt date and time.  

## 2020-02-22 ENCOUNTER — Ambulatory Visit (HOSPITAL_COMMUNITY)
Admission: RE | Admit: 2020-02-22 | Discharge: 2020-02-22 | Disposition: A | Discharge status: Home / Self Care | Payer: Medicare Other | Source: Ambulatory Visit | Attending: Hematology and Oncology | Admitting: Hematology and Oncology

## 2020-02-22 ENCOUNTER — Other Ambulatory Visit: Payer: Self-pay

## 2020-02-22 DIAGNOSIS — C541 Malignant neoplasm of endometrium: Secondary | ICD-10-CM | POA: Insufficient documentation

## 2020-02-22 DIAGNOSIS — Z90710 Acquired absence of both cervix and uterus: Secondary | ICD-10-CM | POA: Diagnosis not present

## 2020-02-22 DIAGNOSIS — K769 Liver disease, unspecified: Secondary | ICD-10-CM | POA: Insufficient documentation

## 2020-02-22 DIAGNOSIS — I2510 Atherosclerotic heart disease of native coronary artery without angina pectoris: Secondary | ICD-10-CM | POA: Diagnosis not present

## 2020-02-22 DIAGNOSIS — R591 Generalized enlarged lymph nodes: Secondary | ICD-10-CM | POA: Diagnosis not present

## 2020-02-22 DIAGNOSIS — I700 Atherosclerosis of aorta: Secondary | ICD-10-CM | POA: Insufficient documentation

## 2020-02-22 LAB — GLUCOSE, CAPILLARY: Glucose-Capillary: 106 mg/dL — ABNORMAL HIGH (ref 70–99)

## 2020-02-22 MED ORDER — FLUDEOXYGLUCOSE F - 18 (FDG) INJECTION
8.7200 | Freq: Once | INTRAVENOUS | Status: AC | PRN
  Administered 2020-02-22: 11:00:00 8.72 via INTRAVENOUS

## 2020-02-23 ENCOUNTER — Inpatient Hospital Stay: Payer: Medicare Other

## 2020-02-23 ENCOUNTER — Telehealth: Payer: Self-pay | Admitting: Hematology and Oncology

## 2020-02-23 ENCOUNTER — Other Ambulatory Visit: Payer: Self-pay

## 2020-02-23 ENCOUNTER — Inpatient Hospital Stay: Payer: Medicare Other | Admitting: Hematology and Oncology

## 2020-02-23 ENCOUNTER — Encounter: Payer: Self-pay | Admitting: Hematology and Oncology

## 2020-02-23 ENCOUNTER — Inpatient Hospital Stay: Payer: Medicare Other | Attending: Gynecologic Oncology

## 2020-02-23 DIAGNOSIS — C541 Malignant neoplasm of endometrium: Secondary | ICD-10-CM | POA: Diagnosis present

## 2020-02-23 DIAGNOSIS — Z5112 Encounter for antineoplastic immunotherapy: Secondary | ICD-10-CM | POA: Diagnosis not present

## 2020-02-23 DIAGNOSIS — D72819 Decreased white blood cell count, unspecified: Secondary | ICD-10-CM | POA: Diagnosis not present

## 2020-02-23 DIAGNOSIS — I10 Essential (primary) hypertension: Secondary | ICD-10-CM | POA: Insufficient documentation

## 2020-02-23 DIAGNOSIS — Z7189 Other specified counseling: Secondary | ICD-10-CM

## 2020-02-23 LAB — CBC WITH DIFFERENTIAL (CANCER CENTER ONLY)
Abs Immature Granulocytes: 0.02 10*3/uL (ref 0.00–0.07)
Basophils Absolute: 0 10*3/uL (ref 0.0–0.1)
Basophils Relative: 1 %
Eosinophils Absolute: 0.1 10*3/uL (ref 0.0–0.5)
Eosinophils Relative: 3 %
HCT: 40.2 % (ref 36.0–46.0)
Hemoglobin: 13.9 g/dL (ref 12.0–15.0)
Immature Granulocytes: 1 %
Lymphocytes Relative: 22 %
Lymphs Abs: 0.9 10*3/uL (ref 0.7–4.0)
MCH: 31.2 pg (ref 26.0–34.0)
MCHC: 34.6 g/dL (ref 30.0–36.0)
MCV: 90.1 fL (ref 80.0–100.0)
Monocytes Absolute: 0.4 10*3/uL (ref 0.1–1.0)
Monocytes Relative: 10 %
Neutro Abs: 2.5 10*3/uL (ref 1.7–7.7)
Neutrophils Relative %: 63 %
Platelet Count: 154 10*3/uL (ref 150–400)
RBC: 4.46 MIL/uL (ref 3.87–5.11)
RDW: 12.4 % (ref 11.5–15.5)
WBC Count: 3.9 10*3/uL — ABNORMAL LOW (ref 4.0–10.5)
nRBC: 0 % (ref 0.0–0.2)

## 2020-02-23 LAB — CMP (CANCER CENTER ONLY)
ALT: 13 U/L (ref 0–44)
AST: 18 U/L (ref 15–41)
Albumin: 4.1 g/dL (ref 3.5–5.0)
Alkaline Phosphatase: 94 U/L (ref 38–126)
Anion gap: 11 (ref 5–15)
BUN: 15 mg/dL (ref 8–23)
CO2: 22 mmol/L (ref 22–32)
Calcium: 9.4 mg/dL (ref 8.9–10.3)
Chloride: 107 mmol/L (ref 98–111)
Creatinine: 0.83 mg/dL (ref 0.44–1.00)
GFR, Est AFR Am: >60 mL/min (ref >60)
GFR, Estimated: >60 mL/min (ref >60)
Glucose, Bld: 120 mg/dL — ABNORMAL HIGH (ref 70–99)
Potassium: 3.9 mmol/L (ref 3.5–5.1)
Sodium: 140 mmol/L (ref 135–145)
Total Bilirubin: 0.9 mg/dL (ref 0.3–1.2)
Total Protein: 8 g/dL (ref 6.5–8.1)

## 2020-02-23 LAB — TSH: TSH: 1.156 u[IU]/mL (ref 0.308–3.960)

## 2020-02-23 MED ORDER — SODIUM CHLORIDE 0.9 % IV SOLN
Freq: Once | INTRAVENOUS | Status: AC
  Filled 2020-02-23: qty 250

## 2020-02-23 MED ORDER — SODIUM CHLORIDE 0.9 % IV SOLN
200.0000 mg | Freq: Once | INTRAVENOUS | Status: AC
  Administered 2020-02-23: 200 mg via INTRAVENOUS
  Filled 2020-02-23: qty 8

## 2020-02-23 NOTE — Assessment & Plan Note (Signed)
This is likely due to recent treatment. The patient denies recent history of fevers, cough, chills, diarrhea or dysuria. She is asymptomatic from the leukopenia. I will observe for now.    

## 2020-02-23 NOTE — Progress Notes (Signed)
[place] OFFICE PROGRESS NOTE  Patient Care Team: Buzzy Han, MD as PCP - General (Family Medicine)  ASSESSMENT & PLAN:  Endometrial cancer [facility]) I have reviewed multiple imaging studies with the patient and her son She has very good response of the treatment She denies side effects from pembrolizumab so far I recommend minimum 3 more months of treatment and repeat imaging study after that She is in agreement  Essential hypertension She is noted to have elevated blood pressure It could be compounded by anxiety and blood draw Her blood pressure monitoring at home are actually within normal limits   Leukopenia This is likely due to recent treatment. The patient denies recent history of fevers, cough, chills, diarrhea or dysuria. She is asymptomatic from the leukopenia. I will observe for now.    No orders of the defined types were placed in this encounter.   All questions were answered. The patient knows to call the clinic with any problems, questions or concerns. The total time spent in the appointment was 20 minutes encounter with patients including review of chart and various tests results, discussions about plan of care and coordination of care plan   [name], MD 02/23/2020 4:03 PM  INTERVAL HISTORY: Please see below for problem oriented charting. She returns for treatment and follow-up of imaging study She is doing well No side effects from treatment so far She is walking Denies pain No infusion reaction  SUMMARY OF ONCOLOGIC HISTORY: Oncology History Overview Note  MMR - Abnormal   Endometrial cancer ([place])  06/01/2013 Initial Diagnosis   Endometrial cancer   06/01/2013 Initial Biopsy   EMB for AUB Endometrium, biopsy - POSITIVE FOR ENDOMETRIAL ADENOCARCINOMA, SEE COMMENT. Microscopic Comment As sampled the endometrial adenocarcinoma appears to be endometrioid type, FIGO Grade I.   07/21/2013 Surgery   PREOPERATIVE DIAGNOSIS:  Grade 1 endometrial carcinoma. Uterine fibroids    PROCEDURE: Total abdominal hysterectomy, bilateral salpingo-oophorectomy, pelvic lymphadenectomy   SURGEON: [name], M.D  SURGICAL FINDINGS: At the time of exploratory laparotomy the uterus was approximately 16-18 weeks size with multiple subserosal fibroids. Tubes and ovaries were normal. On frozen section patient had endometrial carcinoma invading only the inner half of the myometrium. Exploration the upper abdomen revealed no evidence of metastatic disease. There was no pelvic or para-aortic lymphadenopathy.     07/21/2013 Pathologic Stage   1. Uterus +/- tubes/ovaries, neoplastic - INVASIVE ENDOMETRIOID CARCINOMA, FIGO GRADE II, WITH SQUAMOUS DIFFERENTIATION, CONFINED WITHIN INNER HALF OF THE MYOMETRIUM. - LEIOMYOMATA AND ADENOMYOSIS. - CERVIX: BENIGN SQUAMOUS MUCOSA AND ENDOCERVICAL MUCOSA, NO DYSPLASIA OR MALIGNANCY. - BILATERAL OVARIES AND FALLOPIAN TUBES: NO PATHOLOGIC ABNORMALITIES. 2. Lymph nodes, regional resection, left pelvic - FOUR LYMPH NODES, NO EVIDENCE OF METASTATIC CARCINOMA (0/4). 3. Lymph nodes, regional resection, right pelvic - THREE LYMPH NODES, NEGATIVE FOR METASTATIC CARCINOMA (0/3). Microscopic Comment 1. ONCOLOGY TABLE - UTERUS, CARCINOMA OR CARCINOSARCOMA Specimen: Uterus, cervix, bilateral ovaries and fallopian tubes Procedure: Total hysterectomy and bilateral salpingo-oophorectomy Lymph node sampling performed: Yes Specimen integrity: Intact Maximum tumor size (cm): 2.4 cm, gross measurement Histologic type: Invasive endometrioid carcinoma with squamous differentiation Grade: FIGO II Myometrial invasion: 1.1 cm where myometrium is 3.7 cm in thickness Cervical stromal involvement: Not identified Extent of involvement of other organs: No Lymph vascular invasion: Not identified Peritoneal washings: Negative (Please correlate with [id number]) Lymph nodes: number examined 7; number positive  0 Pelvic lymph nodes: 0 involved of 7 lymph nodes Para-aortic lymph nodes: N/A TNM code: pT1a, pN0 FIGO Stage (  based on pathologic findings, needs clinical correlation): IA   09/22/2019 Imaging   CT A/P: There is a left paraspinous mass which is poorly defined and low-density measuring 3 x 2.2 by 5.5 cm. Deviates the left ureter laterally. No additional periaortic masses are identified.   10/13/2019 Pathology Results   A. LYMPH NODE, LEFT PARASPINAL, NEEDLE CORE BIOPSY: - Poorly differentiated carcinoma. COMMENT: Poorly differentiated carcinoma is present in the background of a majority of necrosis and fibrosis tissue. No distinct nodal tissue is identified. Immunohistochemistry for CK7, PAX 8 and ER is positive. CK20, TTF-1, CDX-2 and GATA-3 are negative. The immunophenotype is compatible with the provided clinical history of gynecologic cancer.   10/19/2019 Cancer Staging   Staging form: Corpus Uteri - Carcinoma and Carcinosarcoma, AJCC 8th Edition - Pathologic: Stage IIIC2 (pT1a, pN2a, cM0) - Signed by [name], MD on 10/29/2019    Genetic Testing   Patient has genetic testing done for MMR. Results revealed patient has the following mutation(s): MMR - Abnormal   10/27/2019 PET scan   1. Poorly marginated hypermetabolic nodal conglomerate in the left para-aortic and left common iliac chains, compatible with nodal metastatic disease. 2. No additional hypermetabolic sites of metastatic disease. 3. Right upper lobe 2 mm solid pulmonary nodule, below PET resolution, recommend attention on follow-up chest CT in 3-6 months. 4. Chronic findings include: Aortic Atherosclerosis (ICD10-I70.0). Coronary atherosclerosis. Mild left colonic diverticulosis. Mild biliary and pancreatic duct dilation of uncertain etiology, unchanged since 09/22/2019 CT.   11/10/2019 -  Chemotherapy   The patient had pembrolizumab (KEYTRUDA) 200 mg in sodium chloride 0.9 % 50 mL chemo infusion, 200 mg, Intravenous,  Once, 6 of 8 cycles Administration: 200 mg (11/10/2019), 200 mg (12/22/2019), 200 mg (01/12/2020), 200 mg (02/23/2020), 200 mg (12/01/2019), 200 mg (02/02/2020)  for chemotherapy treatment.    02/22/2020 PET scan   1. Significant partial metabolic response. Left para-aortic and left common iliac adenopathy is decreased in size and significantly decreased in metabolism. 2. No new or progressive hypermetabolic metastatic disease. 3. Aortic Atherosclerosis (ICD10-I70.0). Additional chronic findings as detailed.     REVIEW OF SYSTEMS:   Constitutional: Denies fevers, chills or abnormal weight loss Eyes: Denies blurriness of vision Ears, nose, mouth, throat, and face: Denies mucositis or sore throat Respiratory: Denies cough, dyspnea or wheezes Cardiovascular: Denies palpitation, chest discomfort or lower extremity swelling Gastrointestinal:  Denies nausea, heartburn or change in bowel habits Skin: Denies abnormal skin rashes Lymphatics: Denies new lymphadenopathy or easy bruising Neurological:Denies numbness, tingling or new weaknesses Behavioral/Psych: Mood is stable, no new changes  All other systems were reviewed with the patient and are negative.  I have reviewed the past medical history, past surgical history, social history and family history with the patient and they are unchanged from previous note.  ALLERGIES:  is allergic to iron; iron dextran; penicillins; amoxicillin; ampicillin; codeine; diamox [acetazolamide]; penicillin g; and other.  MEDICATIONS:  Current Outpatient Medications  Medication Sig Dispense Refill  . amLODipine (NORVASC) 10 MG tablet Take by mouth.    Marland Kitchen atorvastatin (LIPITOR) 20 MG tablet Take 1 tablet (20 mg total) by mouth daily. 30 tablet 3  . brimonidine (ALPHAGAN) 0.2 % ophthalmic solution     . busPIRone (BUSPAR) 7.5 MG tablet Take 7.5 mg by mouth 2 (two) times daily.    . dorzolamide-timolol (COSOPT) 22.3-6.8 MG/ML ophthalmic solution Place 1 drop into the  left eye 2 (two) times daily.    . hydrochlorothiazide (HYDRODIURIL) 25 MG tablet Take 1 tablet (  25 mg total) by mouth daily. Discontinue amlodipine (Patient not taking: Reported on 01/25/2020) 90 tablet 1  . latanoprost (XALATAN) 0.005 % ophthalmic solution Apply to eye.    Marland Kitchen lisinopril (ZESTRIL) 40 MG tablet Take 1 tablet by mouth once daily 90 tablet 0  . magnesium 30 MG tablet Take by mouth daily.    . metoprolol tartrate (LOPRESSOR) 25 MG tablet Take 1 tablet (25 mg total) by mouth 2 (two) times daily. 180 tablet 1  . Netarsudil Dimesylate 0.02 % SOLN Apply to eye.    . ondansetron (ZOFRAN) 8 MG tablet Take 1 tablet (8 mg total) by mouth every 8 (eight) hours as needed for nausea. 30 tablet 3  . prednisoLONE acetate (PRED FORTE) 1 % ophthalmic suspension Apply to eye.    . prochlorperazine (COMPAZINE) 10 MG tablet Take 1 tablet (10 mg total) by mouth every 6 (six) hours as needed for nausea or vomiting. 30 tablet 0  . VYZULTA 0.024 % SOLN      No current facility-administered medications for this visit.    PHYSICAL EXAMINATION: ECOG PERFORMANCE STATUS: 1 - Symptomatic but completely ambulatory  Vitals:   02/23/20 0825  BP: (!) 164/100  Pulse: 87  Resp: 18  Temp: 98 F (36.7 C)  SpO2: 100%   Filed Weights   02/23/20 0825  Weight: 156 lb 12.8 oz (71.1 kg)    GENERAL:alert, no distress and comfortable Musculoskeletal:no cyanosis of digits and no clubbing  NEURO: alert & oriented x 3 with fluent speech, no focal motor/sensory deficits  LABORATORY DATA:  I have reviewed the data as listed    Component Value Date/Time   NA 140 02/23/2020 0753   NA 139 03/03/2019 0934   NA 142 07/09/2013 1144   K 3.9 02/23/2020 0753   K 3.7 07/09/2013 1144   CL 107 02/23/2020 0753   CO2 22 02/23/2020 0753   CO2 23 07/09/2013 1144   GLUCOSE 120 (H) 02/23/2020 0753   GLUCOSE 115 07/09/2013 1144   BUN 15 02/23/2020 0753   BUN 25 03/03/2019 0934   BUN 14.4 07/09/2013 1144   CREATININE  0.83 02/23/2020 0753   CREATININE 0.82 01/02/2017 1159   CREATININE 0.8 07/09/2013 1144   CALCIUM 9.4 02/23/2020 0753   CALCIUM 9.3 07/09/2013 1144   PROT 8.0 02/23/2020 0753   PROT 8.0 03/03/2019 0934   PROT 7.8 07/09/2013 1144   ALBUMIN 4.1 02/23/2020 0753   ALBUMIN 4.7 03/03/2019 0934   ALBUMIN 3.7 07/09/2013 1144   AST 18 02/23/2020 0753   AST 9 07/09/2013 1144   ALT 13 02/23/2020 0753   ALT <6 07/09/2013 1144   ALKPHOS 94 02/23/2020 0753   ALKPHOS 58 07/09/2013 1144   BILITOT 0.9 02/23/2020 0753   BILITOT 0.40 07/09/2013 1144   GFRNONAA >60 02/23/2020 0753   GFRNONAA 74 02/01/2015 1238   GFRAA >60 02/23/2020 0753   GFRAA 85 02/01/2015 1238    No results found for: SPEP, UPEP  Lab Results  Component Value Date   WBC 3.9 (L) 02/23/2020   NEUTROABS 2.5 02/23/2020   HGB 13.9 02/23/2020   HCT 40.2 02/23/2020   MCV 90.1 02/23/2020   PLT 154 02/23/2020      Chemistry      Component Value Date/Time   NA 140 02/23/2020 0753   NA 139 03/03/2019 0934   NA 142 07/09/2013 1144   K 3.9 02/23/2020 0753   K 3.7 07/09/2013 1144   CL 107 02/23/2020 0753   CO2  22 02/23/2020 0753   CO2 23 07/09/2013 1144   BUN 15 02/23/2020 0753   BUN 25 03/03/2019 0934   BUN 14.4 07/09/2013 1144   CREATININE 0.83 02/23/2020 0753   CREATININE 0.82 01/02/2017 1159   CREATININE 0.8 07/09/2013 1144      Component Value Date/Time   CALCIUM 9.4 02/23/2020 0753   CALCIUM 9.3 07/09/2013 1144   ALKPHOS 94 02/23/2020 0753   ALKPHOS 58 07/09/2013 1144   AST 18 02/23/2020 0753   AST 9 07/09/2013 1144   ALT 13 02/23/2020 0753   ALT <6 07/09/2013 1144   BILITOT 0.9 02/23/2020 0753   BILITOT 0.40 07/09/2013 1144       RADIOGRAPHIC STUDIES: I have reviewed multiple PET CT scan with her and her son I have personally reviewed the radiological images as listed and agreed with the findings in the report. NM PET Image Restag (PS) Skull Base To Thigh  Result Date: 02/22/2020 CLINICAL DATA:   Subsequent treatment strategy for recurrent endometrial carcinoma on interval pembrolizumab. Most recent COVID vaccination in the left upper extremity 12/19/2019. TAHBSO 07/21/2013. EXAM: NUCLEAR MEDICINE PET SKULL BASE TO THIGH TECHNIQUE: 8.7 mCi F-18 FDG was injected intravenously. Full-ring PET imaging was performed from the skull base to thigh after the radiotracer. CT data was obtained and used for attenuation correction and anatomic localization. Fasting blood glucose: 106 mg/dl COMPARISON:  10/27/2019 PET-CT. FINDINGS: Mediastinal blood pool activity: SUV max 2.9 Liver activity: SUV max NA NECK: No enlarged or hypermetabolic lymph nodes in the neck. New symmetric brown fat hypermetabolism in the lower neck bilaterally. Incidental CT findings: Stable hypodense non hypermetabolic 1.1 cm right thyroid nodule. Not clinically significant; no follow-up imaging recommended (ref: J Am Coll Radiol. 2015 Feb;12(2): 143-50). CHEST: No enlarged or hypermetabolic axillary, mediastinal or hilar lymph nodes. No hypermetabolic pulmonary findings. Incidental CT findings: Coronary atherosclerosis. Atherosclerotic nonaneurysmal thoracic aorta. Tiny 2 mm peripheral right upper lobe pulmonary nodule (series 8/image 39) is stable and below PET resolution. No acute consolidative airspace disease, lung masses or new significant pulmonary nodules. ABDOMEN/PELVIS: Previously visualized hypermetabolic left para-aortic and left common iliac nodes have decreased in size and significantly decreased in metabolism. Left para-aortic node measures 1.9 cm with max SUV 3.5 (series 4/image 131), previously 3.4 cm with max SUV 17.3. Left common iliac node measures 1.4 cm with max SUV 3.7 (series 4/image 130), previously 2.3 cm with max SUV 17.3. No new enlarged or hypermetabolic abdominopelvic nodes. No abnormal hypermetabolic activity within the liver, pancreas, adrenal glands, or spleen. Incidental CT findings: Scattered subcentimeter hypodense  liver lesions are too small to characterize and are unchanged, below PET resolution. Atherosclerotic nonaneurysmal abdominal aorta. Hysterectomy. SKELETON: No focal hypermetabolic activity to suggest skeletal metastasis. Incidental CT findings: none IMPRESSION: 1. Significant partial metabolic response. Left para-aortic and left common iliac adenopathy is decreased in size and significantly decreased in metabolism. 2. No new or progressive hypermetabolic metastatic disease. 3. Aortic Atherosclerosis (ICD10-I70.0). Additional chronic findings as detailed. Electronically Signed   By: Jason A Poff M.D.   On: 02/22/2020 15:54   

## 2020-02-23 NOTE — Telephone Encounter (Signed)
Scheduled appts per 5/10 los. Pt confirmed appt date and time.

## 2020-02-23 NOTE — Assessment & Plan Note (Signed)
She is noted to have elevated blood pressure It could be compounded by anxiety and blood draw Her blood pressure monitoring at home are actually within normal limits  

## 2020-02-23 NOTE — Assessment & Plan Note (Signed)
I have reviewed multiple imaging studies with the patient and her son She has very good response of the treatment She denies side effects from pembrolizumab so far I recommend minimum 3 more months of treatment and repeat imaging study after that She is in agreement

## 2020-02-23 NOTE — Patient Instructions (Signed)
[facility] Discharge Instructions for Patients Receiving Chemotherapy  Today you received the following chemotherapy agents:  Keytruda.  To help prevent nausea and vomiting after your treatment, we encourage you to take your nausea medication as directed.   If you develop nausea and vomiting that is not controlled by your nausea medication, call the clinic.   BELOW ARE SYMPTOMS THAT SHOULD BE REPORTED IMMEDIATELY:  *FEVER GREATER THAN 100.5 F  *CHILLS WITH OR WITHOUT FEVER  NAUSEA AND VOMITING THAT IS NOT CONTROLLED WITH YOUR NAUSEA MEDICATION  *UNUSUAL SHORTNESS OF BREATH  *UNUSUAL BRUISING OR BLEEDING  TENDERNESS IN MOUTH AND THROAT WITH OR WITHOUT PRESENCE OF ULCERS  *URINARY PROBLEMS  *BOWEL PROBLEMS  UNUSUAL RASH Items with * indicate a potential emergency and should be followed up as soon as possible.  Feel free to call the clinic should you have any questions or concerns. The clinic phone number is [phone].  Please show the CHEMO ALERT CARD at check-in to the Emergency Department and triage nurse.    

## 2020-02-24 DIAGNOSIS — D849 Immunodeficiency, unspecified: Secondary | ICD-10-CM | POA: Insufficient documentation

## 2020-03-10 NOTE — Progress Notes (Signed)
Pharmacist Chemotherapy Monitoring - Follow Up Assessment    I verify that I have reviewed each item in the below checklist:  . Regimen for the patient is scheduled for the appropriate day and plan matches scheduled date. Marland Kitchen Appropriate non-routine labs are ordered dependent on drug ordered. . If applicable, additional medications reviewed and ordered per protocol based on lifetime cumulative doses and/or treatment regimen.   Plan for follow-up and/or issues identified: No . I-vent associated with next due treatment: No . MD and/or nursing notified: No  Arthelia Callicott D 03/10/2020 11:18 AM

## 2020-03-15 ENCOUNTER — Ambulatory Visit: Payer: Medicare Other

## 2020-03-15 ENCOUNTER — Ambulatory Visit: Payer: Medicare Other | Admitting: Hematology and Oncology

## 2020-03-15 ENCOUNTER — Other Ambulatory Visit: Payer: Medicare Other

## 2020-03-17 ENCOUNTER — Inpatient Hospital Stay: Payer: Medicare Other | Admitting: Hematology and Oncology

## 2020-03-17 ENCOUNTER — Inpatient Hospital Stay: Payer: Medicare Other

## 2020-03-17 ENCOUNTER — Inpatient Hospital Stay: Payer: Medicare Other | Attending: Gynecologic Oncology

## 2020-03-17 ENCOUNTER — Other Ambulatory Visit: Payer: Self-pay

## 2020-03-17 ENCOUNTER — Encounter: Payer: Self-pay | Admitting: Hematology and Oncology

## 2020-03-17 DIAGNOSIS — C541 Malignant neoplasm of endometrium: Secondary | ICD-10-CM | POA: Diagnosis not present

## 2020-03-17 DIAGNOSIS — Z5112 Encounter for antineoplastic immunotherapy: Secondary | ICD-10-CM | POA: Diagnosis present

## 2020-03-17 DIAGNOSIS — Z7189 Other specified counseling: Secondary | ICD-10-CM

## 2020-03-17 DIAGNOSIS — Z79899 Other long term (current) drug therapy: Secondary | ICD-10-CM | POA: Insufficient documentation

## 2020-03-17 DIAGNOSIS — I10 Essential (primary) hypertension: Secondary | ICD-10-CM | POA: Diagnosis not present

## 2020-03-17 DIAGNOSIS — Z9221 Personal history of antineoplastic chemotherapy: Secondary | ICD-10-CM | POA: Diagnosis not present

## 2020-03-17 LAB — CMP (CANCER CENTER ONLY)
ALT: 12 U/L (ref 0–44)
AST: 17 U/L (ref 15–41)
Albumin: 4.2 g/dL (ref 3.5–5.0)
Alkaline Phosphatase: 101 U/L (ref 38–126)
Anion gap: 12 (ref 5–15)
BUN: 18 mg/dL (ref 8–23)
CO2: 21 mmol/L — ABNORMAL LOW (ref 22–32)
Calcium: 9.6 mg/dL (ref 8.9–10.3)
Chloride: 109 mmol/L (ref 98–111)
Creatinine: 0.85 mg/dL (ref 0.44–1.00)
GFR, Est AFR Am: >60 mL/min (ref >60)
GFR, Estimated: >60 mL/min (ref >60)
Glucose, Bld: 106 mg/dL — ABNORMAL HIGH (ref 70–99)
Potassium: 3.6 mmol/L (ref 3.5–5.1)
Sodium: 142 mmol/L (ref 135–145)
Total Bilirubin: 0.8 mg/dL (ref 0.3–1.2)
Total Protein: 8.2 g/dL — ABNORMAL HIGH (ref 6.5–8.1)

## 2020-03-17 LAB — CBC WITH DIFFERENTIAL (CANCER CENTER ONLY)
Abs Immature Granulocytes: 0.02 10*3/uL (ref 0.00–0.07)
Basophils Absolute: 0 10*3/uL (ref 0.0–0.1)
Basophils Relative: 1 %
Eosinophils Absolute: 0.2 10*3/uL (ref 0.0–0.5)
Eosinophils Relative: 3 %
HCT: 40.3 % (ref 36.0–46.0)
Hemoglobin: 14.1 g/dL (ref 12.0–15.0)
Immature Granulocytes: 0 %
Lymphocytes Relative: 18 %
Lymphs Abs: 0.9 10*3/uL (ref 0.7–4.0)
MCH: 31 pg (ref 26.0–34.0)
MCHC: 35 g/dL (ref 30.0–36.0)
MCV: 88.6 fL (ref 80.0–100.0)
Monocytes Absolute: 0.4 10*3/uL (ref 0.1–1.0)
Monocytes Relative: 8 %
Neutro Abs: 3.7 10*3/uL (ref 1.7–7.7)
Neutrophils Relative %: 70 %
Platelet Count: 165 10*3/uL (ref 150–400)
RBC: 4.55 MIL/uL (ref 3.87–5.11)
RDW: 12.7 % (ref 11.5–15.5)
WBC Count: 5.2 10*3/uL (ref 4.0–10.5)
nRBC: 0 % (ref 0.0–0.2)

## 2020-03-17 LAB — TSH: TSH: 1.558 u[IU]/mL (ref 0.308–3.960)

## 2020-03-17 MED ORDER — SODIUM CHLORIDE 0.9 % IV SOLN
Freq: Once | INTRAVENOUS | Status: AC
  Filled 2020-03-17: qty 250

## 2020-03-17 MED ORDER — SODIUM CHLORIDE 0.9 % IV SOLN
200.0000 mg | Freq: Once | INTRAVENOUS | Status: AC
  Administered 2020-03-17: 200 mg via INTRAVENOUS
  Filled 2020-03-17: qty 8

## 2020-03-17 NOTE — Assessment & Plan Note (Signed)
She has very good response of the treatment She denies side effects from pembrolizumab so far I recommend a few more months of treatment and repeat imaging study after that, due around August She is in agreement

## 2020-03-17 NOTE — Assessment & Plan Note (Signed)
She is noted to have elevated blood pressure It could be compounded by anxiety and blood draw Her blood pressure monitoring at home are actually within normal limits  

## 2020-03-17 NOTE — Patient Instructions (Signed)
[facility] Discharge Instructions for Patients Receiving Chemotherapy  Today you received the following chemotherapy agents:  Keytruda.  To help prevent nausea and vomiting after your treatment, we encourage you to take your nausea medication as directed.   If you develop nausea and vomiting that is not controlled by your nausea medication, call the clinic.   BELOW ARE SYMPTOMS THAT SHOULD BE REPORTED IMMEDIATELY:  *FEVER GREATER THAN 100.5 F  *CHILLS WITH OR WITHOUT FEVER  NAUSEA AND VOMITING THAT IS NOT CONTROLLED WITH YOUR NAUSEA MEDICATION  *UNUSUAL SHORTNESS OF BREATH  *UNUSUAL BRUISING OR BLEEDING  TENDERNESS IN MOUTH AND THROAT WITH OR WITHOUT PRESENCE OF ULCERS  *URINARY PROBLEMS  *BOWEL PROBLEMS  UNUSUAL RASH Items with * indicate a potential emergency and should be followed up as soon as possible.  Feel free to call the clinic should you have any questions or concerns. The clinic phone number is [phone].  Please show the CHEMO ALERT CARD at check-in to the Emergency Department and triage nurse.    

## 2020-03-17 NOTE — Progress Notes (Signed)
[facility] OFFICE PROGRESS NOTE  Patient Care Team: Buzzy Han, MD as PCP - General (Family Medicine)  ASSESSMENT & PLAN:  Endometrial cancer [facility]) She has very good response of the treatment She denies side effects from pembrolizumab so far I recommend a few more months of treatment and repeat imaging study after that, due around August She is in agreement  Essential hypertension She is noted to have elevated blood pressure It could be compounded by anxiety and blood draw Her blood pressure monitoring at home are actually within normal limits    No orders of the defined types were placed in this encounter.   All questions were answered. The patient knows to call the clinic with any problems, questions or concerns. The total time spent in the appointment was 20 minutes encounter with patients including review of chart and various tests results, discussions about plan of care and coordination of care plan   [name], MD 03/17/2020 5:48 PM  INTERVAL HISTORY: Please see below for problem oriented charting. She returns with her son for further follow-up She brought with her some blood pressure readings from home which are within normal limits She is doing well No side effects from treatment so far No infusion reaction Denies abdominal pain or recent changes in bowel habits  SUMMARY OF ONCOLOGIC HISTORY: Oncology History Overview Note  MMR - Abnormal   Endometrial cancer ([place])  06/01/2013 Initial Diagnosis   Endometrial cancer   06/01/2013 Initial Biopsy   EMB for AUB Endometrium, biopsy - POSITIVE FOR ENDOMETRIAL ADENOCARCINOMA, SEE COMMENT. Microscopic Comment As sampled the endometrial adenocarcinoma appears to be endometrioid type, FIGO Grade I.   07/21/2013 Surgery   PREOPERATIVE DIAGNOSIS: Grade 1 endometrial carcinoma. Uterine fibroids    PROCEDURE: Total abdominal hysterectomy, bilateral salpingo-oophorectomy, pelvic  lymphadenectomy   SURGEON: [name], M.D  SURGICAL FINDINGS: At the time of exploratory laparotomy the uterus was approximately 16-18 weeks size with multiple subserosal fibroids. Tubes and ovaries were normal. On frozen section patient had endometrial carcinoma invading only the inner half of the myometrium. Exploration the upper abdomen revealed no evidence of metastatic disease. There was no pelvic or para-aortic lymphadenopathy.     07/21/2013 Pathologic Stage   1. Uterus +/- tubes/ovaries, neoplastic - INVASIVE ENDOMETRIOID CARCINOMA, FIGO GRADE II, WITH SQUAMOUS DIFFERENTIATION, CONFINED WITHIN INNER HALF OF THE MYOMETRIUM. - LEIOMYOMATA AND ADENOMYOSIS. - CERVIX: BENIGN SQUAMOUS MUCOSA AND ENDOCERVICAL MUCOSA, NO DYSPLASIA OR MALIGNANCY. - BILATERAL OVARIES AND FALLOPIAN TUBES: NO PATHOLOGIC ABNORMALITIES. 2. Lymph nodes, regional resection, left pelvic - FOUR LYMPH NODES, NO EVIDENCE OF METASTATIC CARCINOMA (0/4). 3. Lymph nodes, regional resection, right pelvic - THREE LYMPH NODES, NEGATIVE FOR METASTATIC CARCINOMA (0/3). Microscopic Comment 1. ONCOLOGY TABLE - UTERUS, CARCINOMA OR CARCINOSARCOMA Specimen: Uterus, cervix, bilateral ovaries and fallopian tubes Procedure: Total hysterectomy and bilateral salpingo-oophorectomy Lymph node sampling performed: Yes Specimen integrity: Intact Maximum tumor size (cm): 2.4 cm, gross measurement Histologic type: Invasive endometrioid carcinoma with squamous differentiation Grade: FIGO II Myometrial invasion: 1.1 cm where myometrium is 3.7 cm in thickness Cervical stromal involvement: Not identified Extent of involvement of other organs: No Lymph vascular invasion: Not identified Peritoneal washings: Negative (Please correlate with [id number]) Lymph nodes: number examined 7; number positive 0 Pelvic lymph nodes: 0 involved of 7 lymph nodes Para-aortic lymph nodes: N/A TNM code: pT1a, pN0 FIGO Stage (based on pathologic  findings, needs clinical correlation): IA   09/22/2019 Imaging   CT A/P: There is a left paraspinous mass which is  poorly defined and low-density measuring 3 x 2.2 by 5.5 cm. Deviates the left ureter laterally. No additional periaortic masses are identified.   10/13/2019 Pathology Results   A. LYMPH NODE, LEFT PARASPINAL, NEEDLE CORE BIOPSY: - Poorly differentiated carcinoma. COMMENT: Poorly differentiated carcinoma is present in the background of a majority of necrosis and fibrosis tissue. No distinct nodal tissue is identified. Immunohistochemistry for CK7, PAX 8 and ER is positive. CK20, TTF-1, CDX-2 and GATA-3 are negative. The immunophenotype is compatible with the provided clinical history of gynecologic cancer.   10/19/2019 Cancer Staging   Staging form: Corpus Uteri - Carcinoma and Carcinosarcoma, AJCC 8th Edition - Pathologic: Stage IIIC2 (pT1a, pN2a, cM0) - Signed by [name], MD on 10/29/2019    Genetic Testing   Patient has genetic testing done for MMR. Results revealed patient has the following mutation(s): MMR - Abnormal   10/27/2019 PET scan   1. Poorly marginated hypermetabolic nodal conglomerate in the left para-aortic and left common iliac chains, compatible with nodal metastatic disease. 2. No additional hypermetabolic sites of metastatic disease. 3. Right upper lobe 2 mm solid pulmonary nodule, below PET resolution, recommend attention on follow-up chest CT in 3-6 months. 4. Chronic findings include: Aortic Atherosclerosis (ICD10-I70.0). Coronary atherosclerosis. Mild left colonic diverticulosis. Mild biliary and pancreatic duct dilation of uncertain etiology, unchanged since 09/22/2019 CT.   11/10/2019 -  Chemotherapy   The patient had pembrolizumab (KEYTRUDA) 200 mg in sodium chloride 0.9 % 50 mL chemo infusion, 200 mg, Intravenous, Once, 7 of 8 cycles Administration: 200 mg (11/10/2019), 200 mg (12/22/2019), 200 mg (01/12/2020), 200 mg (02/23/2020), 200 mg (12/01/2019), 200  mg (02/02/2020), 200 mg (03/17/2020)  for chemotherapy treatment.    02/22/2020 PET scan   1. Significant partial metabolic response. Left para-aortic and left common iliac adenopathy is decreased in size and significantly decreased in metabolism. 2. No new or progressive hypermetabolic metastatic disease. 3. Aortic Atherosclerosis (ICD10-I70.0). Additional chronic findings as detailed.     REVIEW OF SYSTEMS:   Constitutional: Denies fevers, chills or abnormal weight loss Eyes: Denies blurriness of vision Ears, nose, mouth, throat, and face: Denies mucositis or sore throat Respiratory: Denies cough, dyspnea or wheezes Cardiovascular: Denies palpitation, chest discomfort or lower extremity swelling Gastrointestinal:  Denies nausea, heartburn or change in bowel habits Skin: Denies abnormal skin rashes Lymphatics: Denies new lymphadenopathy or easy bruising Neurological:Denies numbness, tingling or new weaknesses Behavioral/Psych: Mood is stable, no new changes  All other systems were reviewed with the patient and are negative.  I have reviewed the past medical history, past surgical history, social history and family history with the patient and they are unchanged from previous note.  ALLERGIES:  is allergic to iron; iron dextran; penicillins; amoxicillin; ampicillin; codeine; diamox [acetazolamide]; penicillin g; and other.  MEDICATIONS:  Current Outpatient Medications  Medication Sig Dispense Refill  . amLODipine (NORVASC) 10 MG tablet Take by mouth.    Marland Kitchen atorvastatin (LIPITOR) 20 MG tablet Take 1 tablet (20 mg total) by mouth daily. 30 tablet 3  . brimonidine (ALPHAGAN) 0.2 % ophthalmic solution     . busPIRone (BUSPAR) 7.5 MG tablet Take 7.5 mg by mouth 2 (two) times daily.    . dorzolamide-timolol (COSOPT) 22.3-6.8 MG/ML ophthalmic solution Place 1 drop into the left eye 2 (two) times daily.    . hydrochlorothiazide (HYDRODIURIL) 25 MG tablet Take 1 tablet (25 mg total) by mouth  daily. Discontinue amlodipine (Patient not taking: Reported on 01/25/2020) 90 tablet 1  . latanoprost (XALATAN)  0.005 % ophthalmic solution Apply to eye.    Marland Kitchen lisinopril (ZESTRIL) 40 MG tablet Take 1 tablet by mouth once daily 90 tablet 0  . magnesium 30 MG tablet Take by mouth daily.    . metoprolol tartrate (LOPRESSOR) 25 MG tablet Take 1 tablet (25 mg total) by mouth 2 (two) times daily. 180 tablet 1  . Netarsudil Dimesylate 0.02 % SOLN Apply to eye.    . ondansetron (ZOFRAN) 8 MG tablet Take 1 tablet (8 mg total) by mouth every 8 (eight) hours as needed for nausea. 30 tablet 3  . prednisoLONE acetate (PRED FORTE) 1 % ophthalmic suspension Apply to eye.    . prochlorperazine (COMPAZINE) 10 MG tablet Take 1 tablet (10 mg total) by mouth every 6 (six) hours as needed for nausea or vomiting. 30 tablet 0  . VYZULTA 0.024 % SOLN      No current facility-administered medications for this visit.    PHYSICAL EXAMINATION: ECOG PERFORMANCE STATUS: 1 - Symptomatic but completely ambulatory  Vitals:   03/17/20 1001  BP: (!) 173/96  Pulse: 91  Resp: 17  Temp: 98.5 F (36.9 C)  SpO2: 100%   Filed Weights   03/17/20 1001  Weight: 157 lb 6.4 oz (71.4 kg)    GENERAL:alert, no distress and comfortable SKIN: skin color, texture, turgor are normal, no rashes or significant lesions EYES: normal, Conjunctiva are pink and non-injected, sclera clear OROPHARYNX:no exudate, no erythema and lips, buccal mucosa, and tongue normal  NECK: supple, thyroid normal size, non-tender, without nodularity LYMPH:  no palpable lymphadenopathy in the cervical, axillary or inguinal LUNGS: clear to auscultation and percussion with normal breathing effort HEART: regular rate & rhythm and no murmurs and no lower extremity edema ABDOMEN:abdomen soft, non-tender and normal bowel sounds Musculoskeletal:no cyanosis of digits and no clubbing  NEURO: alert & oriented x 3 with fluent speech, no focal motor/sensory  deficits  LABORATORY DATA:  I have reviewed the data as listed    Component Value Date/Time   NA 142 03/17/2020 0928   NA 139 03/03/2019 0934   NA 142 07/09/2013 1144   K 3.6 03/17/2020 0928   K 3.7 07/09/2013 1144   CL 109 03/17/2020 0928   CO2 21 (L) 03/17/2020 0928   CO2 23 07/09/2013 1144   GLUCOSE 106 (H) 03/17/2020 0928   GLUCOSE 115 07/09/2013 1144   BUN 18 03/17/2020 0928   BUN 25 03/03/2019 0934   BUN 14.4 07/09/2013 1144   CREATININE 0.85 03/17/2020 0928   CREATININE 0.82 01/02/2017 1159   CREATININE 0.8 07/09/2013 1144   CALCIUM 9.6 03/17/2020 0928   CALCIUM 9.3 07/09/2013 1144   PROT 8.2 (H) 03/17/2020 0928   PROT 8.0 03/03/2019 0934   PROT 7.8 07/09/2013 1144   ALBUMIN 4.2 03/17/2020 0928   ALBUMIN 4.7 03/03/2019 0934   ALBUMIN 3.7 07/09/2013 1144   AST 17 03/17/2020 0928   AST 9 07/09/2013 1144   ALT 12 03/17/2020 0928   ALT <6 07/09/2013 1144   ALKPHOS 101 03/17/2020 0928   ALKPHOS 58 07/09/2013 1144   BILITOT 0.8 03/17/2020 0928   BILITOT 0.40 07/09/2013 1144   GFRNONAA >60 03/17/2020 0928   GFRNONAA 74 02/01/2015 1238   GFRAA >60 03/17/2020 0928   GFRAA 85 02/01/2015 1238    No results found for: SPEP, UPEP  Lab Results  Component Value Date   WBC 5.2 03/17/2020   NEUTROABS 3.7 03/17/2020   HGB 14.1 03/17/2020   HCT 40.3 03/17/2020  MCV 88.6 03/17/2020   PLT 165 03/17/2020      Chemistry      Component Value Date/Time   NA 142 03/17/2020 0928   NA 139 03/03/2019 0934   NA 142 07/09/2013 1144   K 3.6 03/17/2020 0928   K 3.7 07/09/2013 1144   CL 109 03/17/2020 0928   CO2 21 (L) 03/17/2020 0928   CO2 23 07/09/2013 1144   BUN 18 03/17/2020 0928   BUN 25 03/03/2019 0934   BUN 14.4 07/09/2013 1144   CREATININE 0.85 03/17/2020 0928   CREATININE 0.82 01/02/2017 1159   CREATININE 0.8 07/09/2013 1144      Component Value Date/Time   CALCIUM 9.6 03/17/2020 0928   CALCIUM 9.3 07/09/2013 1144   ALKPHOS 101 03/17/2020 0928   ALKPHOS 58  07/09/2013 1144   AST 17 03/17/2020 0928   AST 9 07/09/2013 1144   ALT 12 03/17/2020 0928   ALT <6 07/09/2013 1144   BILITOT 0.8 03/17/2020 0928   BILITOT 0.40 07/09/2013 1144       RADIOGRAPHIC STUDIES: I have personally reviewed the radiological images as listed and agreed with the findings in the report. NM PET Image Restag (PS) Skull Base To Thigh  Result Date: 02/22/2020 CLINICAL DATA:  Subsequent treatment strategy for recurrent endometrial carcinoma on interval pembrolizumab. Most recent COVID vaccination in the left upper extremity 12/19/2019. TAHBSO 07/21/2013. EXAM: NUCLEAR MEDICINE PET SKULL BASE TO THIGH TECHNIQUE: 8.7 mCi F-18 FDG was injected intravenously. Full-ring PET imaging was performed from the skull base to thigh after the radiotracer. CT data was obtained and used for attenuation correction and anatomic localization. Fasting blood glucose: 106 mg/dl COMPARISON:  10/27/2019 PET-CT. FINDINGS: Mediastinal blood pool activity: SUV max 2.9 Liver activity: SUV max NA NECK: No enlarged or hypermetabolic lymph nodes in the neck. New symmetric brown fat hypermetabolism in the lower neck bilaterally. Incidental CT findings: Stable hypodense non hypermetabolic 1.1 cm right thyroid nodule. Not clinically significant; no follow-up imaging recommended (ref: J Am Coll Radiol. 2015 Feb;12(2): 143-50). CHEST: No enlarged or hypermetabolic axillary, mediastinal or hilar lymph nodes. No hypermetabolic pulmonary findings. Incidental CT findings: Coronary atherosclerosis. Atherosclerotic nonaneurysmal thoracic aorta. Tiny 2 mm peripheral right upper lobe pulmonary nodule (series 8/image 39) is stable and below PET resolution. No acute consolidative airspace disease, lung masses or new significant pulmonary nodules. ABDOMEN/PELVIS: Previously visualized hypermetabolic left para-aortic and left common iliac nodes have decreased in size and significantly decreased in metabolism. Left para-aortic node  measures 1.9 cm with max SUV 3.5 (series 4/image 131), previously 3.4 cm with max SUV 17.3. Left common iliac node measures 1.4 cm with max SUV 3.7 (series 4/image 130), previously 2.3 cm with max SUV 17.3. No new enlarged or hypermetabolic abdominopelvic nodes. No abnormal hypermetabolic activity within the liver, pancreas, adrenal glands, or spleen. Incidental CT findings: Scattered subcentimeter hypodense liver lesions are too small to characterize and are unchanged, below PET resolution. Atherosclerotic nonaneurysmal abdominal aorta. Hysterectomy. SKELETON: No focal hypermetabolic activity to suggest skeletal metastasis. Incidental CT findings: none IMPRESSION: 1. Significant partial metabolic response. Left para-aortic and left common iliac adenopathy is decreased in size and significantly decreased in metabolism. 2. No new or progressive hypermetabolic metastatic disease. 3. Aortic Atherosclerosis (ICD10-I70.0). Additional chronic findings as detailed. Electronically Signed   By: Ilona Sorrel M.D.   On: 02/22/2020 15:54

## 2020-04-05 ENCOUNTER — Inpatient Hospital Stay (HOSPITAL_BASED_OUTPATIENT_CLINIC_OR_DEPARTMENT_OTHER): Payer: Medicare Other | Admitting: Hematology and Oncology

## 2020-04-05 ENCOUNTER — Other Ambulatory Visit: Payer: Medicare Other

## 2020-04-05 ENCOUNTER — Telehealth: Payer: Self-pay | Admitting: Hematology and Oncology

## 2020-04-05 ENCOUNTER — Ambulatory Visit: Payer: Medicare Other | Admitting: Hematology and Oncology

## 2020-04-05 ENCOUNTER — Other Ambulatory Visit: Payer: Self-pay

## 2020-04-05 ENCOUNTER — Encounter: Payer: Self-pay | Admitting: Hematology and Oncology

## 2020-04-05 ENCOUNTER — Inpatient Hospital Stay: Payer: Medicare Other

## 2020-04-05 ENCOUNTER — Ambulatory Visit: Payer: Medicare Other

## 2020-04-05 DIAGNOSIS — C541 Malignant neoplasm of endometrium: Secondary | ICD-10-CM

## 2020-04-05 DIAGNOSIS — Z7189 Other specified counseling: Secondary | ICD-10-CM

## 2020-04-05 DIAGNOSIS — Z5112 Encounter for antineoplastic immunotherapy: Secondary | ICD-10-CM | POA: Diagnosis not present

## 2020-04-05 DIAGNOSIS — I10 Essential (primary) hypertension: Secondary | ICD-10-CM | POA: Diagnosis not present

## 2020-04-05 LAB — CBC WITH DIFFERENTIAL (CANCER CENTER ONLY)
Abs Immature Granulocytes: 0.01 10*3/uL (ref 0.00–0.07)
Basophils Absolute: 0 10*3/uL (ref 0.0–0.1)
Basophils Relative: 0 %
Eosinophils Absolute: 0.2 10*3/uL (ref 0.0–0.5)
Eosinophils Relative: 3 %
HCT: 40.7 % (ref 36.0–46.0)
Hemoglobin: 14.3 g/dL (ref 12.0–15.0)
Immature Granulocytes: 0 %
Lymphocytes Relative: 22 %
Lymphs Abs: 1.1 10*3/uL (ref 0.7–4.0)
MCH: 31.3 pg (ref 26.0–34.0)
MCHC: 35.1 g/dL (ref 30.0–36.0)
MCV: 89.1 fL (ref 80.0–100.0)
Monocytes Absolute: 0.5 10*3/uL (ref 0.1–1.0)
Monocytes Relative: 9 %
Neutro Abs: 3.3 10*3/uL (ref 1.7–7.7)
Neutrophils Relative %: 66 %
Platelet Count: 156 10*3/uL (ref 150–400)
RBC: 4.57 MIL/uL (ref 3.87–5.11)
RDW: 12.7 % (ref 11.5–15.5)
WBC Count: 5.1 10*3/uL (ref 4.0–10.5)
nRBC: 0 % (ref 0.0–0.2)

## 2020-04-05 LAB — CMP (CANCER CENTER ONLY)
ALT: 15 U/L (ref 0–44)
AST: 16 U/L (ref 15–41)
Albumin: 4.3 g/dL (ref 3.5–5.0)
Alkaline Phosphatase: 103 U/L (ref 38–126)
Anion gap: 10 (ref 5–15)
BUN: 13 mg/dL (ref 8–23)
CO2: 26 mmol/L (ref 22–32)
Calcium: 9.9 mg/dL (ref 8.9–10.3)
Chloride: 105 mmol/L (ref 98–111)
Creatinine: 0.86 mg/dL (ref 0.44–1.00)
GFR, Est AFR Am: >60 mL/min (ref >60)
GFR, Estimated: >60 mL/min (ref >60)
Glucose, Bld: 104 mg/dL — ABNORMAL HIGH (ref 70–99)
Potassium: 3.6 mmol/L (ref 3.5–5.1)
Sodium: 141 mmol/L (ref 135–145)
Total Bilirubin: 0.8 mg/dL (ref 0.3–1.2)
Total Protein: 8.3 g/dL — ABNORMAL HIGH (ref 6.5–8.1)

## 2020-04-05 LAB — TSH: TSH: 2.207 u[IU]/mL (ref 0.308–3.960)

## 2020-04-05 MED ORDER — SODIUM CHLORIDE 0.9 % IV SOLN
Freq: Once | INTRAVENOUS | Status: AC
  Filled 2020-04-05: qty 250

## 2020-04-05 MED ORDER — SODIUM CHLORIDE 0.9 % IV SOLN
200.0000 mg | Freq: Once | INTRAVENOUS | Status: AC
  Administered 2020-04-05: 200 mg via INTRAVENOUS
  Filled 2020-04-05: qty 8

## 2020-04-05 NOTE — Patient Instructions (Signed)
[facility] Discharge Instructions for Patients Receiving Chemotherapy  Today you received the following chemotherapy agents:  Keytruda.  To help prevent nausea and vomiting after your treatment, we encourage you to take your nausea medication as directed.   If you develop nausea and vomiting that is not controlled by your nausea medication, call the clinic.   BELOW ARE SYMPTOMS THAT SHOULD BE REPORTED IMMEDIATELY:  *FEVER GREATER THAN 100.5 F  *CHILLS WITH OR WITHOUT FEVER  NAUSEA AND VOMITING THAT IS NOT CONTROLLED WITH YOUR NAUSEA MEDICATION  *UNUSUAL SHORTNESS OF BREATH  *UNUSUAL BRUISING OR BLEEDING  TENDERNESS IN MOUTH AND THROAT WITH OR WITHOUT PRESENCE OF ULCERS  *URINARY PROBLEMS  *BOWEL PROBLEMS  UNUSUAL RASH Items with * indicate a potential emergency and should be followed up as soon as possible.  Feel free to call the clinic should you have any questions or concerns. The clinic phone number is [phone].  Please show the CHEMO ALERT CARD at check-in to the Emergency Department and triage nurse.    

## 2020-04-05 NOTE — Progress Notes (Signed)
[place] OFFICE PROGRESS NOTE  Patient Care Team: Buzzy Han, MD as PCP - General (Family Medicine)  ASSESSMENT & PLAN:  Endometrial cancer [facility]) She has very good response of the treatment She denies side effects from pembrolizumab so far I recommend a few more treatment and repeat imaging study after that, due around August She is in agreement  Essential hypertension She is noted to have elevated blood pressure It could be compounded by anxiety and blood draw Her blood pressure monitoring at home are actually within normal limits    No orders of the defined types were placed in this encounter.   All questions were answered. The patient knows to call the clinic with any problems, questions or concerns. The total time spent in the appointment was 20 minutes encounter with patients including review of chart and various tests results, discussions about plan of care and coordination of care plan   [name], MD 04/05/2020 11:17 AM  INTERVAL HISTORY: Please see below for problem oriented charting. She returns with her son for further follow-up She tolerated recent treatment well She put some eyedrops on the left eye causing some redness but it does not cause any pain She denies infusion reactions No recent abdominal discomfort, nausea or changes in bowel habits Denies infusion reactions  SUMMARY OF ONCOLOGIC HISTORY: Oncology History Overview Note  MMR - Abnormal   Endometrial cancer ([place])  06/01/2013 Initial Diagnosis   Endometrial cancer   06/01/2013 Initial Biopsy   EMB for AUB Endometrium, biopsy - POSITIVE FOR ENDOMETRIAL ADENOCARCINOMA, SEE COMMENT. Microscopic Comment As sampled the endometrial adenocarcinoma appears to be endometrioid type, FIGO Grade I.   07/21/2013 Surgery   PREOPERATIVE DIAGNOSIS: Grade 1 endometrial carcinoma. Uterine fibroids    PROCEDURE: Total abdominal hysterectomy, bilateral salpingo-oophorectomy, pelvic  lymphadenectomy   SURGEON: [name], M.D  SURGICAL FINDINGS: At the time of exploratory laparotomy the uterus was approximately 16-18 weeks size with multiple subserosal fibroids. Tubes and ovaries were normal. On frozen section patient had endometrial carcinoma invading only the inner half of the myometrium. Exploration the upper abdomen revealed no evidence of metastatic disease. There was no pelvic or para-aortic lymphadenopathy.     07/21/2013 Pathologic Stage   1. Uterus +/- tubes/ovaries, neoplastic - INVASIVE ENDOMETRIOID CARCINOMA, FIGO GRADE II, WITH SQUAMOUS DIFFERENTIATION, CONFINED WITHIN INNER HALF OF THE MYOMETRIUM. - LEIOMYOMATA AND ADENOMYOSIS. - CERVIX: BENIGN SQUAMOUS MUCOSA AND ENDOCERVICAL MUCOSA, NO DYSPLASIA OR MALIGNANCY. - BILATERAL OVARIES AND FALLOPIAN TUBES: NO PATHOLOGIC ABNORMALITIES. 2. Lymph nodes, regional resection, left pelvic - FOUR LYMPH NODES, NO EVIDENCE OF METASTATIC CARCINOMA (0/4). 3. Lymph nodes, regional resection, right pelvic - THREE LYMPH NODES, NEGATIVE FOR METASTATIC CARCINOMA (0/3). Microscopic Comment 1. ONCOLOGY TABLE - UTERUS, CARCINOMA OR CARCINOSARCOMA Specimen: Uterus, cervix, bilateral ovaries and fallopian tubes Procedure: Total hysterectomy and bilateral salpingo-oophorectomy Lymph node sampling performed: Yes Specimen integrity: Intact Maximum tumor size (cm): 2.4 cm, gross measurement Histologic type: Invasive endometrioid carcinoma with squamous differentiation Grade: FIGO II Myometrial invasion: 1.1 cm where myometrium is 3.7 cm in thickness Cervical stromal involvement: Not identified Extent of involvement of other organs: No Lymph vascular invasion: Not identified Peritoneal washings: Negative (Please correlate with [id number]) Lymph nodes: number examined 7; number positive 0 Pelvic lymph nodes: 0 involved of 7 lymph nodes Para-aortic lymph nodes: N/A TNM code: pT1a, pN0 FIGO Stage (based on pathologic  findings, needs clinical correlation): IA   09/22/2019 Imaging   CT A/P: There is a left paraspinous mass which is  poorly defined and low-density measuring 3 x 2.2 by 5.5 cm. Deviates the left ureter laterally. No additional periaortic masses are identified.   10/13/2019 Pathology Results   A. LYMPH NODE, LEFT PARASPINAL, NEEDLE CORE BIOPSY: - Poorly differentiated carcinoma. COMMENT: Poorly differentiated carcinoma is present in the background of a majority of necrosis and fibrosis tissue. No distinct nodal tissue is identified. Immunohistochemistry for CK7, PAX 8 and ER is positive. CK20, TTF-1, CDX-2 and GATA-3 are negative. The immunophenotype is compatible with the provided clinical history of gynecologic cancer.   10/19/2019 Cancer Staging   Staging form: Corpus Uteri - Carcinoma and Carcinosarcoma, AJCC 8th Edition - Pathologic: Stage IIIC2 (pT1a, pN2a, cM0) - Signed by [name], MD on 10/29/2019    Genetic Testing   Patient has genetic testing done for MMR. Results revealed patient has the following mutation(s): MMR - Abnormal   10/27/2019 PET scan   1. Poorly marginated hypermetabolic nodal conglomerate in the left para-aortic and left common iliac chains, compatible with nodal metastatic disease. 2. No additional hypermetabolic sites of metastatic disease. 3. Right upper lobe 2 mm solid pulmonary nodule, below PET resolution, recommend attention on follow-up chest CT in 3-6 months. 4. Chronic findings include: Aortic Atherosclerosis (ICD10-I70.0). Coronary atherosclerosis. Mild left colonic diverticulosis. Mild biliary and pancreatic duct dilation of uncertain etiology, unchanged since 09/22/2019 CT.   11/10/2019 -  Chemotherapy   The patient had pembrolizumab (KEYTRUDA) 200 mg in sodium chloride 0.9 % 50 mL chemo infusion, 200 mg, Intravenous, Once, 7 of 10 cycles Administration: 200 mg (11/10/2019), 200 mg (12/22/2019), 200 mg (01/12/2020), 200 mg (02/23/2020), 200 mg (12/01/2019),  200 mg (02/02/2020), 200 mg (03/17/2020)  for chemotherapy treatment.    02/22/2020 PET scan   1. Significant partial metabolic response. Left para-aortic and left common iliac adenopathy is decreased in size and significantly decreased in metabolism. 2. No new or progressive hypermetabolic metastatic disease. 3. Aortic Atherosclerosis (ICD10-I70.0). Additional chronic findings as detailed.     REVIEW OF SYSTEMS:   Constitutional: Denies fevers, chills or abnormal weight loss Eyes: Denies blurriness of vision Ears, nose, mouth, throat, and face: Denies mucositis or sore throat Respiratory: Denies cough, dyspnea or wheezes Cardiovascular: Denies palpitation, chest discomfort or lower extremity swelling Gastrointestinal:  Denies nausea, heartburn or change in bowel habits Skin: Denies abnormal skin rashes Lymphatics: Denies new lymphadenopathy or easy bruising Neurological:Denies numbness, tingling or new weaknesses Behavioral/Psych: Mood is stable, no new changes  All other systems were reviewed with the patient and are negative.  I have reviewed the past medical history, past surgical history, social history and family history with the patient and they are unchanged from previous note.  ALLERGIES:  is allergic to iron, iron dextran, penicillins, amoxicillin, ampicillin, codeine, diamox [acetazolamide], penicillin g, and other.  MEDICATIONS:  Current Outpatient Medications  Medication Sig Dispense Refill  . amLODipine (NORVASC) 10 MG tablet Take by mouth.    Marland Kitchen atorvastatin (LIPITOR) 20 MG tablet Take 1 tablet (20 mg total) by mouth daily. 30 tablet 3  . brimonidine (ALPHAGAN) 0.2 % ophthalmic solution     . busPIRone (BUSPAR) 7.5 MG tablet Take 7.5 mg by mouth 2 (two) times daily.    . dorzolamide-timolol (COSOPT) 22.3-6.8 MG/ML ophthalmic solution Place 1 drop into the left eye 2 (two) times daily.    . hydrochlorothiazide (HYDRODIURIL) 25 MG tablet Take 1 tablet (25 mg total) by mouth  daily. Discontinue amlodipine (Patient not taking: Reported on 01/25/2020) 90 tablet 1  . latanoprost (XALATAN)  0.005 % ophthalmic solution Apply to eye.    Marland Kitchen lisinopril (ZESTRIL) 40 MG tablet Take 1 tablet by mouth once daily 90 tablet 0  . magnesium 30 MG tablet Take by mouth daily.    . metoprolol tartrate (LOPRESSOR) 25 MG tablet Take 1 tablet (25 mg total) by mouth 2 (two) times daily. 180 tablet 1  . Netarsudil Dimesylate 0.02 % SOLN Apply to eye.    . ondansetron (ZOFRAN) 8 MG tablet Take 1 tablet (8 mg total) by mouth every 8 (eight) hours as needed for nausea. 30 tablet 3  . prednisoLONE acetate (PRED FORTE) 1 % ophthalmic suspension Apply to eye.    . prochlorperazine (COMPAZINE) 10 MG tablet Take 1 tablet (10 mg total) by mouth every 6 (six) hours as needed for nausea or vomiting. 30 tablet 0  . VYZULTA 0.024 % SOLN      No current facility-administered medications for this visit.    PHYSICAL EXAMINATION: ECOG PERFORMANCE STATUS: 1 - Symptomatic but completely ambulatory  Vitals:   04/05/20 1025  BP: (!) 171/83  Pulse: 99  Resp: 18  Temp: 98.6 F (37 C)  SpO2: 100%   Filed Weights   04/05/20 1025  Weight: 158 lb 9.6 oz (71.9 kg)    GENERAL:alert, no distress and comfortable SKIN: skin color, texture, turgor are normal, no rashes or significant lesions EYES: Noted left eye conjunctiva redness OROPHARYNX:no exudate, no erythema and lips, buccal mucosa, and tongue normal  NECK: supple, thyroid normal size, non-tender, without nodularity LYMPH:  no palpable lymphadenopathy in the cervical, axillary or inguinal LUNGS: clear to auscultation and percussion with normal breathing effort HEART: regular rate & rhythm and no murmurs and no lower extremity edema ABDOMEN:abdomen soft, non-tender and normal bowel sounds Musculoskeletal:no cyanosis of digits and no clubbing  NEURO: alert & oriented x 3 with fluent speech, no focal motor/sensory deficits  LABORATORY DATA:  I have  reviewed the data as listed    Component Value Date/Time   NA 141 04/05/2020 0957   NA 139 03/03/2019 0934   NA 142 07/09/2013 1144   K 3.6 04/05/2020 0957   K 3.7 07/09/2013 1144   CL 105 04/05/2020 0957   CO2 26 04/05/2020 0957   CO2 23 07/09/2013 1144   GLUCOSE 104 (H) 04/05/2020 0957   GLUCOSE 115 07/09/2013 1144   BUN 13 04/05/2020 0957   BUN 25 03/03/2019 0934   BUN 14.4 07/09/2013 1144   CREATININE 0.86 04/05/2020 0957   CREATININE 0.82 01/02/2017 1159   CREATININE 0.8 07/09/2013 1144   CALCIUM 9.9 04/05/2020 0957   CALCIUM 9.3 07/09/2013 1144   PROT 8.3 (H) 04/05/2020 0957   PROT 8.0 03/03/2019 0934   PROT 7.8 07/09/2013 1144   ALBUMIN 4.3 04/05/2020 0957   ALBUMIN 4.7 03/03/2019 0934   ALBUMIN 3.7 07/09/2013 1144   AST 16 04/05/2020 0957   AST 9 07/09/2013 1144   ALT 15 04/05/2020 0957   ALT <6 07/09/2013 1144   ALKPHOS 103 04/05/2020 0957   ALKPHOS 58 07/09/2013 1144   BILITOT 0.8 04/05/2020 0957   BILITOT 0.40 07/09/2013 1144   GFRNONAA >60 04/05/2020 0957   GFRNONAA 74 02/01/2015 1238   GFRAA >60 04/05/2020 0957   GFRAA 85 02/01/2015 1238    No results found for: SPEP, UPEP  Lab Results  Component Value Date   WBC 5.1 04/05/2020   NEUTROABS 3.3 04/05/2020   HGB 14.3 04/05/2020   HCT 40.7 04/05/2020   MCV 89.1 04/05/2020  PLT 156 04/05/2020      Chemistry      Component Value Date/Time   NA 141 04/05/2020 0957   NA 139 03/03/2019 0934   NA 142 07/09/2013 1144   K 3.6 04/05/2020 0957   K 3.7 07/09/2013 1144   CL 105 04/05/2020 0957   CO2 26 04/05/2020 0957   CO2 23 07/09/2013 1144   BUN 13 04/05/2020 0957   BUN 25 03/03/2019 0934   BUN 14.4 07/09/2013 1144   CREATININE 0.86 04/05/2020 0957   CREATININE 0.82 01/02/2017 1159   CREATININE 0.8 07/09/2013 1144      Component Value Date/Time   CALCIUM 9.9 04/05/2020 0957   CALCIUM 9.3 07/09/2013 1144   ALKPHOS 103 04/05/2020 0957   ALKPHOS 58 07/09/2013 1144   AST 16 04/05/2020 0957    AST 9 07/09/2013 1144   ALT 15 04/05/2020 0957   ALT <6 07/09/2013 1144   BILITOT 0.8 04/05/2020 0957   BILITOT 0.40 07/09/2013 1144

## 2020-04-05 NOTE — Assessment & Plan Note (Signed)
She is noted to have elevated blood pressure It could be compounded by anxiety and blood draw Her blood pressure monitoring at home are actually within normal limits

## 2020-04-05 NOTE — Telephone Encounter (Signed)
Scheduled per 6/22 sch msg. Called pt to confirm appts. Taking calendar printout to infusion.

## 2020-04-05 NOTE — Assessment & Plan Note (Signed)
She has very good response of the treatment She denies side effects from pembrolizumab so far I recommend a few more treatment and repeat imaging study after that, due around August She is in agreement

## 2020-04-25 ENCOUNTER — Other Ambulatory Visit: Payer: Self-pay

## 2020-04-25 ENCOUNTER — Ambulatory Visit
Admission: RE | Admit: 2020-04-25 | Discharge: 2020-04-25 | Disposition: A | Discharge status: Home / Self Care | Payer: Medicare Other | Source: Ambulatory Visit | Attending: Radiation Oncology | Admitting: Radiation Oncology

## 2020-04-25 ENCOUNTER — Encounter: Payer: Self-pay | Admitting: Radiation Oncology

## 2020-04-25 VITALS — HR 87 | Temp 98.3°F | Resp 18 | Ht 66.0 in | Wt 157.5 lb

## 2020-04-25 DIAGNOSIS — C541 Malignant neoplasm of endometrium: Secondary | ICD-10-CM

## 2020-04-25 NOTE — Progress Notes (Signed)
Radiation Oncology         [phone] ________________________________  Name: Stephanie [name] MRN: [id number]  Date: 04/25/2020  DOB: 08-Mar-1946  Follow-Up Visit Note  CC: Buzzy Han, MD  Buzzy Han*    ICD-10-CM   1. Endometrial cancer ([facility])  C54.1     Diagnosis: Recurrent stage IA grade 2 endometrial carcinoma  Interval Since Last Radiation: Four months and two days.  11/10/2019 - 12/23/2019; 54 Gy in 30 fractions directed at the abdomen  Narrative:  The patient returns today for routine follow-up. The patient continues chemotherapy with Pembrolizumab under the care of Dr. Alvy Bimler. She has tolerated treatment well thus far without any major side effects.  PET scan on 02/22/2020 showed a significant partial metabolic response. The left para-aortic and left common iliac adenopathy had decreased in size and significantly decreased in metabolism. There was no new or progressive hypermetabolic metastatic disease.  On review of systems, she reports overall feeling well appetite is good. She denies abdominal bloating or pelvic pain.  She denies any vaginal bleeding rectal bleeding or hematuria.  ALLERGIES:  is allergic to iron, iron dextran, penicillins, amoxicillin, ampicillin, codeine, diamox [acetazolamide], penicillin g, and other.  Meds: Current Outpatient Medications  Medication Sig Dispense Refill  . amLODipine (NORVASC) 10 MG tablet Take by mouth.    Marland Kitchen atorvastatin (LIPITOR) 20 MG tablet Take 1 tablet (20 mg total) by mouth daily. 30 tablet 3  . brimonidine (ALPHAGAN) 0.2 % ophthalmic solution     . busPIRone (BUSPAR) 7.5 MG tablet Take 7.5 mg by mouth 2 (two) times daily.    . dorzolamide-timolol (COSOPT) 22.3-6.8 MG/ML ophthalmic solution Place 1 drop into the left eye 2 (two) times daily.    . hydrochlorothiazide (HYDRODIURIL) 25 MG tablet Take 1 tablet (25 mg total) by mouth daily. Discontinue amlodipine 90 tablet 1  . latanoprost (XALATAN)  0.005 % ophthalmic solution Apply to eye.    Marland Kitchen lisinopril (ZESTRIL) 40 MG tablet Take 1 tablet by mouth once daily 90 tablet 0  . magnesium 30 MG tablet Take by mouth daily.    . metoprolol tartrate (LOPRESSOR) 25 MG tablet Take 1 tablet (25 mg total) by mouth 2 (two) times daily. 180 tablet 1  . Netarsudil Dimesylate 0.02 % SOLN Apply to eye.    . ondansetron (ZOFRAN) 8 MG tablet Take 1 tablet (8 mg total) by mouth every 8 (eight) hours as needed for nausea. 30 tablet 3  . prednisoLONE acetate (PRED FORTE) 1 % ophthalmic suspension Apply to eye.    . prochlorperazine (COMPAZINE) 10 MG tablet Take 1 tablet (10 mg total) by mouth every 6 (six) hours as needed for nausea or vomiting. 30 tablet 0  . VYZULTA 0.024 % SOLN      No current facility-administered medications for this encounter.    Physical Findings: The patient is in no acute distress. Patient is alert and oriented.  height is 5\' 6"  (1.676 m) and weight is 157 lb 8 oz (71.4 kg). Her oral temperature is 98.3 F (36.8 C). Her pulse is 87. Her respiration is 18 and oxygen saturation is 100%.    Lungs are clear to auscultation bilaterally. Heart has regular rate and rhythm. No palpable cervical, supraclavicular, or axillary adenopathy. Abdomen soft, non-tender, normal bowel sounds.    Lab Findings: Lab Results  Component Value Date   WBC 5.1 04/05/2020   HGB 14.3 04/05/2020   HCT 40.7 04/05/2020   MCV 89.1 04/05/2020   PLT 156 04/05/2020  Radiographic Findings: No results found.  Impression: Recurrent stage IA grade 2 endometrial carcinoma  No clinical evidence of recurrence on exam today. Recent PET scan showed significant response to therapy with no new or progressive hypermetabolic metastatic disease.  Plan: The patient is scheduled to see Dr. Alvy Bimler tomorrow, 04/26/2020. She will follow-up with radiation oncology on an as needed basis.   ____________________________________   Blair Promise, PhD, MD  This  document serves as a record of services personally performed by Gery Pray, MD. It was created on his behalf by Clerance Lav, a trained medical scribe. The creation of this record is based on the scribe's personal observations and the provider's statements to them. This document has been checked and approved by the attending provider.

## 2020-04-25 NOTE — Progress Notes (Signed)
Patient presents today for a follow-up for endometrial cancer. Patient denies having any pain or fatigue. Patient denies having diarrhea. Patient denies having any nausea or vomiting. Patient denies having any vaginal or rectal bleeding. Patient denies having dysuria.    Pulse 87   Temp 98.3 F (36.8 C) (Oral)   Resp 18   Ht 5\' 6"  (1.676 m)   Wt 157 lb 8 oz (71.4 kg)   SpO2 100%   BMI 25.42 kg/m

## 2020-04-26 ENCOUNTER — Other Ambulatory Visit: Payer: Self-pay

## 2020-04-26 ENCOUNTER — Inpatient Hospital Stay: Payer: Medicare Other | Attending: Gynecologic Oncology

## 2020-04-26 ENCOUNTER — Inpatient Hospital Stay: Payer: Medicare Other

## 2020-04-26 ENCOUNTER — Inpatient Hospital Stay: Payer: Medicare Other | Admitting: Hematology and Oncology

## 2020-04-26 ENCOUNTER — Encounter: Payer: Self-pay | Admitting: Hematology and Oncology

## 2020-04-26 VITALS — BP 146/80 | HR 80 | Temp 98.5°F | Resp 18 | Ht 66.0 in | Wt 160.2 lb

## 2020-04-26 DIAGNOSIS — Z79899 Other long term (current) drug therapy: Secondary | ICD-10-CM | POA: Diagnosis not present

## 2020-04-26 DIAGNOSIS — C541 Malignant neoplasm of endometrium: Secondary | ICD-10-CM | POA: Diagnosis not present

## 2020-04-26 DIAGNOSIS — I10 Essential (primary) hypertension: Secondary | ICD-10-CM | POA: Diagnosis not present

## 2020-04-26 DIAGNOSIS — Z7189 Other specified counseling: Secondary | ICD-10-CM

## 2020-04-26 DIAGNOSIS — Z5111 Encounter for antineoplastic chemotherapy: Secondary | ICD-10-CM | POA: Insufficient documentation

## 2020-04-26 LAB — CBC WITH DIFFERENTIAL (CANCER CENTER ONLY)
Abs Immature Granulocytes: 0.01 10*3/uL (ref 0.00–0.07)
Basophils Absolute: 0 10*3/uL (ref 0.0–0.1)
Basophils Relative: 1 %
Eosinophils Absolute: 0.1 10*3/uL (ref 0.0–0.5)
Eosinophils Relative: 2 %
HCT: 39.5 % (ref 36.0–46.0)
Hemoglobin: 13.9 g/dL (ref 12.0–15.0)
Immature Granulocytes: 0 %
Lymphocytes Relative: 23 %
Lymphs Abs: 1.2 10*3/uL (ref 0.7–4.0)
MCH: 31.8 pg (ref 26.0–34.0)
MCHC: 35.2 g/dL (ref 30.0–36.0)
MCV: 90.4 fL (ref 80.0–100.0)
Monocytes Absolute: 0.5 10*3/uL (ref 0.1–1.0)
Monocytes Relative: 9 %
Neutro Abs: 3.4 10*3/uL (ref 1.7–7.7)
Neutrophils Relative %: 65 %
Platelet Count: 141 10*3/uL — ABNORMAL LOW (ref 150–400)
RBC: 4.37 MIL/uL (ref 3.87–5.11)
RDW: 12.6 % (ref 11.5–15.5)
WBC Count: 5.2 10*3/uL (ref 4.0–10.5)
nRBC: 0 % (ref 0.0–0.2)

## 2020-04-26 LAB — CMP (CANCER CENTER ONLY)
ALT: 13 U/L (ref 0–44)
AST: 15 U/L (ref 15–41)
Albumin: 4 g/dL (ref 3.5–5.0)
Alkaline Phosphatase: 96 U/L (ref 38–126)
Anion gap: 11 (ref 5–15)
BUN: 15 mg/dL (ref 8–23)
CO2: 23 mmol/L (ref 22–32)
Calcium: 9.5 mg/dL (ref 8.9–10.3)
Chloride: 108 mmol/L (ref 98–111)
Creatinine: 0.83 mg/dL (ref 0.44–1.00)
GFR, Est AFR Am: >60 mL/min (ref >60)
GFR, Estimated: >60 mL/min (ref >60)
Glucose, Bld: 95 mg/dL (ref 70–99)
Potassium: 3.7 mmol/L (ref 3.5–5.1)
Sodium: 142 mmol/L (ref 135–145)
Total Bilirubin: 0.6 mg/dL (ref 0.3–1.2)
Total Protein: 7.7 g/dL (ref 6.5–8.1)

## 2020-04-26 LAB — TSH: TSH: 1.785 u[IU]/mL (ref 0.308–3.960)

## 2020-04-26 MED ORDER — SODIUM CHLORIDE 0.9 % IV SOLN
Freq: Once | INTRAVENOUS | Status: AC
  Filled 2020-04-26: qty 250

## 2020-04-26 MED ORDER — SODIUM CHLORIDE 0.9 % IV SOLN
200.0000 mg | Freq: Once | INTRAVENOUS | Status: AC
  Administered 2020-04-26: 200 mg via INTRAVENOUS
  Filled 2020-04-26: qty 8

## 2020-04-26 NOTE — Progress Notes (Signed)
[place] OFFICE PROGRESS NOTE  Patient Care Team: Buzzy Han, MD as PCP - General (Family Medicine)  ASSESSMENT & PLAN:  Endometrial cancer [facility]) She has very good response of the treatment She denies side effects from pembrolizumab so far I plan to repeat imaging study before I see her back next month  Essential hypertension She is noted to have elevated blood pressure Her blood pressure monitoring at home are actually within normal limits    Orders Placed This Encounter  Procedures  . CT ABDOMEN PELVIS W CONTRAST    Standing Status:   Future    Standing Expiration Date:   04/26/2021    Order Specific Question:   If indicated for the ordered procedure, I authorize the administration of contrast media per Radiology protocol    Answer:   Yes    Order Specific Question:   Preferred imaging location?    Answer:   [facility]    Order Specific Question:   Radiology Contrast Protocol - do NOT remove file path    Answer:   [email or web address]    All questions were answered. The patient knows to call the clinic with any problems, questions or concerns. The total time spent in the appointment was 20 minutes encounter with patients including review of chart and various tests results, discussions about plan of care and coordination of care plan   [name], MD 04/26/2020 12:06 PM  INTERVAL HISTORY: Please see below for problem oriented charting. She returns with her son for further follow-up She tolerated treatment well No side effects from treatment so far Her blood pressure at home is satisfactory She denies visual changes even though her left sclera appears somewhat injected Denies abdominal pain or changes in bowel habits She is interested to change pembrolizumab to 400 mg dose in the future  SUMMARY OF ONCOLOGIC HISTORY: Oncology History Overview Note  MMR - Abnormal   Endometrial cancer ([place])  06/01/2013  Initial Diagnosis   Endometrial cancer   06/01/2013 Initial Biopsy   EMB for AUB Endometrium, biopsy - POSITIVE FOR ENDOMETRIAL ADENOCARCINOMA, SEE COMMENT. Microscopic Comment As sampled the endometrial adenocarcinoma appears to be endometrioid type, FIGO Grade I.   07/21/2013 Surgery   PREOPERATIVE DIAGNOSIS: Grade 1 endometrial carcinoma. Uterine fibroids    PROCEDURE: Total abdominal hysterectomy, bilateral salpingo-oophorectomy, pelvic lymphadenectomy   SURGEON: [name], M.D  SURGICAL FINDINGS: At the time of exploratory laparotomy the uterus was approximately 16-18 weeks size with multiple subserosal fibroids. Tubes and ovaries were normal. On frozen section patient had endometrial carcinoma invading only the inner half of the myometrium. Exploration the upper abdomen revealed no evidence of metastatic disease. There was no pelvic or para-aortic lymphadenopathy.     07/21/2013 Pathologic Stage   1. Uterus +/- tubes/ovaries, neoplastic - INVASIVE ENDOMETRIOID CARCINOMA, FIGO GRADE II, WITH SQUAMOUS DIFFERENTIATION, CONFINED WITHIN INNER HALF OF THE MYOMETRIUM. - LEIOMYOMATA AND ADENOMYOSIS. - CERVIX: BENIGN SQUAMOUS MUCOSA AND ENDOCERVICAL MUCOSA, NO DYSPLASIA OR MALIGNANCY. - BILATERAL OVARIES AND FALLOPIAN TUBES: NO PATHOLOGIC ABNORMALITIES. 2. Lymph nodes, regional resection, left pelvic - FOUR LYMPH NODES, NO EVIDENCE OF METASTATIC CARCINOMA (0/4). 3. Lymph nodes, regional resection, right pelvic - THREE LYMPH NODES, NEGATIVE FOR METASTATIC CARCINOMA (0/3). Microscopic Comment 1. ONCOLOGY TABLE - UTERUS, CARCINOMA OR CARCINOSARCOMA Specimen: Uterus, cervix, bilateral ovaries and fallopian tubes Procedure: Total hysterectomy and bilateral salpingo-oophorectomy Lymph node sampling performed: Yes Specimen integrity: Intact Maximum tumor size (cm): 2.4 cm, gross measurement Histologic type: Invasive endometrioid carcinoma with squamous  differentiation Grade: FIGO  II Myometrial invasion: 1.1 cm where myometrium is 3.7 cm in thickness Cervical stromal involvement: Not identified Extent of involvement of other organs: No Lymph vascular invasion: Not identified Peritoneal washings: Negative (Please correlate with [id number]) Lymph nodes: number examined 7; number positive 0 Pelvic lymph nodes: 0 involved of 7 lymph nodes Para-aortic lymph nodes: N/A TNM code: pT1a, pN0 FIGO Stage (based on pathologic findings, needs clinical correlation): IA   09/22/2019 Imaging   CT A/P: There is a left paraspinous mass which is poorly defined and low-density measuring 3 x 2.2 by 5.5 cm. Deviates the left ureter laterally. No additional periaortic masses are identified.   10/13/2019 Pathology Results   A. LYMPH NODE, LEFT PARASPINAL, NEEDLE CORE BIOPSY: - Poorly differentiated carcinoma. COMMENT: Poorly differentiated carcinoma is present in the background of a majority of necrosis and fibrosis tissue. No distinct nodal tissue is identified. Immunohistochemistry for CK7, PAX 8 and ER is positive. CK20, TTF-1, CDX-2 and GATA-3 are negative. The immunophenotype is compatible with the provided clinical history of gynecologic cancer.   10/19/2019 Cancer Staging   Staging form: Corpus Uteri - Carcinoma and Carcinosarcoma, AJCC 8th Edition - Pathologic: Stage IIIC2 (pT1a, pN2a, cM0) - Signed by [name], MD on 10/29/2019    Genetic Testing   Patient has genetic testing done for MMR. Results revealed patient has the following mutation(s): MMR - Abnormal   10/27/2019 PET scan   1. Poorly marginated hypermetabolic nodal conglomerate in the left para-aortic and left common iliac chains, compatible with nodal metastatic disease. 2. No additional hypermetabolic sites of metastatic disease. 3. Right upper lobe 2 mm solid pulmonary nodule, below PET resolution, recommend attention on follow-up chest CT in 3-6 months. 4. Chronic findings include: Aortic Atherosclerosis  (ICD10-I70.0). Coronary atherosclerosis. Mild left colonic diverticulosis. Mild biliary and pancreatic duct dilation of uncertain etiology, unchanged since 09/22/2019 CT.   11/10/2019 -  Chemotherapy   The patient had pembrolizumab (KEYTRUDA) 200 mg in sodium chloride 0.9 % 50 mL chemo infusion, 200 mg, Intravenous, Once, 8 of 10 cycles Administration: 200 mg (11/10/2019), 200 mg (12/22/2019), 200 mg (01/12/2020), 200 mg (02/23/2020), 200 mg (12/01/2019), 200 mg (02/02/2020), 200 mg (03/17/2020), 200 mg (04/05/2020)  for chemotherapy treatment.    02/22/2020 PET scan   1. Significant partial metabolic response. Left para-aortic and left common iliac adenopathy is decreased in size and significantly decreased in metabolism. 2. No new or progressive hypermetabolic metastatic disease. 3. Aortic Atherosclerosis (ICD10-I70.0). Additional chronic findings as detailed.     REVIEW OF SYSTEMS:   Constitutional: Denies fevers, chills or abnormal weight loss Eyes: Denies blurriness of vision Ears, nose, mouth, throat, and face: Denies mucositis or sore throat Respiratory: Denies cough, dyspnea or wheezes Cardiovascular: Denies palpitation, chest discomfort or lower extremity swelling Gastrointestinal:  Denies nausea, heartburn or change in bowel habits Skin: Denies abnormal skin rashes Lymphatics: Denies new lymphadenopathy or easy bruising Neurological:Denies numbness, tingling or new weaknesses Behavioral/Psych: Mood is stable, no new changes  All other systems were reviewed with the patient and are negative.  I have reviewed the past medical history, past surgical history, social history and family history with the patient and they are unchanged from previous note.  ALLERGIES:  is allergic to iron, iron dextran, penicillins, amoxicillin, ampicillin, codeine, diamox [acetazolamide], penicillin g, and other.  MEDICATIONS:  Current Outpatient Medications  Medication Sig Dispense Refill  . amLODipine  (NORVASC) 10 MG tablet Take by mouth.    Marland Kitchen atorvastatin (LIPITOR) 20 MG  tablet Take 1 tablet (20 mg total) by mouth daily. 30 tablet 3  . brimonidine (ALPHAGAN) 0.2 % ophthalmic solution     . busPIRone (BUSPAR) 7.5 MG tablet Take 7.5 mg by mouth 2 (two) times daily.    . dorzolamide-timolol (COSOPT) 22.3-6.8 MG/ML ophthalmic solution Place 1 drop into the left eye 2 (two) times daily.    . hydrochlorothiazide (HYDRODIURIL) 25 MG tablet Take 1 tablet (25 mg total) by mouth daily. Discontinue amlodipine 90 tablet 1  . latanoprost (XALATAN) 0.005 % ophthalmic solution Apply to eye.    Marland Kitchen lisinopril (ZESTRIL) 40 MG tablet Take 1 tablet by mouth once daily 90 tablet 0  . magnesium 30 MG tablet Take by mouth daily.    . metoprolol tartrate (LOPRESSOR) 25 MG tablet Take 1 tablet (25 mg total) by mouth 2 (two) times daily. 180 tablet 1  . Netarsudil Dimesylate 0.02 % SOLN Apply to eye.    . ondansetron (ZOFRAN) 8 MG tablet Take 1 tablet (8 mg total) by mouth every 8 (eight) hours as needed for nausea. 30 tablet 3  . prednisoLONE acetate (PRED FORTE) 1 % ophthalmic suspension Apply to eye.    . prochlorperazine (COMPAZINE) 10 MG tablet Take 1 tablet (10 mg total) by mouth every 6 (six) hours as needed for nausea or vomiting. 30 tablet 0  . VYZULTA 0.024 % SOLN      No current facility-administered medications for this visit.    PHYSICAL EXAMINATION: ECOG PERFORMANCE STATUS: 1 - Symptomatic but completely ambulatory  Vitals:   04/26/20 1132  BP: (!) 146/80  Pulse: 80  Resp: 18  Temp: 98.5 F (36.9 C)  SpO2: 100%   Filed Weights   04/26/20 1132  Weight: 160 lb 3.2 oz (72.7 kg)    GENERAL:alert, no distress and comfortable SKIN: skin color, texture, turgor are normal, no rashes or significant lesions EYES: Noted mild scleral icterus affecting her left eye OROPHARYNX:no exudate, no erythema and lips, buccal mucosa, and tongue normal  NECK: supple, thyroid normal size, non-tender, without  nodularity LYMPH:  no palpable lymphadenopathy in the cervical, axillary or inguinal LUNGS: clear to auscultation and percussion with normal breathing effort HEART: regular rate & rhythm and no murmurs and no lower extremity edema ABDOMEN:abdomen soft, non-tender and normal bowel sounds Musculoskeletal:no cyanosis of digits and no clubbing  NEURO: alert & oriented x 3 with fluent speech, no focal motor/sensory deficits  LABORATORY DATA:  I have reviewed the data as listed    Component Value Date/Time   NA 142 04/26/2020 1104   NA 139 03/03/2019 0934   NA 142 07/09/2013 1144   K 3.7 04/26/2020 1104   K 3.7 07/09/2013 1144   CL 108 04/26/2020 1104   CO2 23 04/26/2020 1104   CO2 23 07/09/2013 1144   GLUCOSE 95 04/26/2020 1104   GLUCOSE 115 07/09/2013 1144   BUN 15 04/26/2020 1104   BUN 25 03/03/2019 0934   BUN 14.4 07/09/2013 1144   CREATININE 0.83 04/26/2020 1104   CREATININE 0.82 01/02/2017 1159   CREATININE 0.8 07/09/2013 1144   CALCIUM 9.5 04/26/2020 1104   CALCIUM 9.3 07/09/2013 1144   PROT 7.7 04/26/2020 1104   PROT 8.0 03/03/2019 0934   PROT 7.8 07/09/2013 1144   ALBUMIN 4.0 04/26/2020 1104   ALBUMIN 4.7 03/03/2019 0934   ALBUMIN 3.7 07/09/2013 1144   AST 15 04/26/2020 1104   AST 9 07/09/2013 1144   ALT 13 04/26/2020 1104   ALT <6 07/09/2013 1144  ALKPHOS 96 04/26/2020 1104   ALKPHOS 58 07/09/2013 1144   BILITOT 0.6 04/26/2020 1104   BILITOT 0.40 07/09/2013 1144   GFRNONAA >60 04/26/2020 1104   GFRNONAA 74 02/01/2015 1238   GFRAA >60 04/26/2020 1104   GFRAA 85 02/01/2015 1238    No results found for: SPEP, UPEP  Lab Results  Component Value Date   WBC 5.2 04/26/2020   NEUTROABS 3.4 04/26/2020   HGB 13.9 04/26/2020   HCT 39.5 04/26/2020   MCV 90.4 04/26/2020   PLT 141 (L) 04/26/2020      Chemistry      Component Value Date/Time   NA 142 04/26/2020 1104   NA 139 03/03/2019 0934   NA 142 07/09/2013 1144   K 3.7 04/26/2020 1104   K 3.7 07/09/2013  1144   CL 108 04/26/2020 1104   CO2 23 04/26/2020 1104   CO2 23 07/09/2013 1144   BUN 15 04/26/2020 1104   BUN 25 03/03/2019 0934   BUN 14.4 07/09/2013 1144   CREATININE 0.83 04/26/2020 1104   CREATININE 0.82 01/02/2017 1159   CREATININE 0.8 07/09/2013 1144      Component Value Date/Time   CALCIUM 9.5 04/26/2020 1104   CALCIUM 9.3 07/09/2013 1144   ALKPHOS 96 04/26/2020 1104   ALKPHOS 58 07/09/2013 1144   AST 15 04/26/2020 1104   AST 9 07/09/2013 1144   ALT 13 04/26/2020 1104   ALT <6 07/09/2013 1144   BILITOT 0.6 04/26/2020 1104   BILITOT 0.40 07/09/2013 1144

## 2020-04-26 NOTE — Assessment & Plan Note (Signed)
She is noted to have elevated blood pressure Her blood pressure monitoring at home are actually within normal limits  

## 2020-04-26 NOTE — Patient Instructions (Signed)
[facility] Discharge Instructions for Patients Receiving Chemotherapy  Today you received the following chemotherapy agents keytruda  To help prevent nausea and vomiting after your treatment, we encourage you to take your nausea medication as directed If you develop nausea and vomiting that is not controlled by your nausea medication, call the clinic.   BELOW ARE SYMPTOMS THAT SHOULD BE REPORTED IMMEDIATELY:  *FEVER GREATER THAN 100.5 F  *CHILLS WITH OR WITHOUT FEVER  NAUSEA AND VOMITING THAT IS NOT CONTROLLED WITH YOUR NAUSEA MEDICATION  *UNUSUAL SHORTNESS OF BREATH  *UNUSUAL BRUISING OR BLEEDING  TENDERNESS IN MOUTH AND THROAT WITH OR WITHOUT PRESENCE OF ULCERS  *URINARY PROBLEMS  *BOWEL PROBLEMS  UNUSUAL RASH Items with * indicate a potential emergency and should be followed up as soon as possible.  Feel free to call the clinic should you have any questions or concerns. The clinic phone number is [phone].  Please show the CHEMO ALERT CARD at check-in to the Emergency Department and triage nurse.   

## 2020-04-26 NOTE — Assessment & Plan Note (Signed)
She has very good response of the treatment She denies side effects from pembrolizumab so far I plan to repeat imaging study before I see her back next month

## 2020-04-27 ENCOUNTER — Other Ambulatory Visit: Payer: Self-pay | Admitting: Hematology and Oncology

## 2020-05-16 ENCOUNTER — Encounter (HOSPITAL_COMMUNITY): Payer: Self-pay

## 2020-05-16 ENCOUNTER — Ambulatory Visit (HOSPITAL_COMMUNITY)
Admission: RE | Admit: 2020-05-16 | Discharge: 2020-05-16 | Disposition: A | Discharge status: Home / Self Care | Payer: Medicare Other | Source: Ambulatory Visit | Attending: Hematology and Oncology | Admitting: Hematology and Oncology

## 2020-05-16 ENCOUNTER — Other Ambulatory Visit: Payer: Self-pay

## 2020-05-16 DIAGNOSIS — C541 Malignant neoplasm of endometrium: Secondary | ICD-10-CM | POA: Diagnosis not present

## 2020-05-16 MED ORDER — IOHEXOL 300 MG/ML  SOLN
100.0000 mL | Freq: Once | INTRAMUSCULAR | Status: AC | PRN
  Administered 2020-05-16: 100 mL via INTRAVENOUS

## 2020-05-17 ENCOUNTER — Encounter: Payer: Self-pay | Admitting: Hematology and Oncology

## 2020-05-17 ENCOUNTER — Inpatient Hospital Stay: Payer: Medicare Other | Attending: Gynecologic Oncology

## 2020-05-17 ENCOUNTER — Inpatient Hospital Stay: Payer: Medicare Other | Admitting: Hematology and Oncology

## 2020-05-17 ENCOUNTER — Inpatient Hospital Stay: Payer: Medicare Other

## 2020-05-17 ENCOUNTER — Other Ambulatory Visit: Payer: Self-pay

## 2020-05-17 DIAGNOSIS — Z5111 Encounter for antineoplastic chemotherapy: Secondary | ICD-10-CM | POA: Insufficient documentation

## 2020-05-17 DIAGNOSIS — Z7189 Other specified counseling: Secondary | ICD-10-CM

## 2020-05-17 DIAGNOSIS — C541 Malignant neoplasm of endometrium: Secondary | ICD-10-CM | POA: Diagnosis not present

## 2020-05-17 DIAGNOSIS — Z79899 Other long term (current) drug therapy: Secondary | ICD-10-CM | POA: Diagnosis not present

## 2020-05-17 LAB — CMP (CANCER CENTER ONLY)
ALT: 13 U/L (ref 0–44)
AST: 18 U/L (ref 15–41)
Albumin: 4.3 g/dL (ref 3.5–5.0)
Alkaline Phosphatase: 101 U/L (ref 38–126)
Anion gap: 10 (ref 5–15)
BUN: 14 mg/dL (ref 8–23)
CO2: 23 mmol/L (ref 22–32)
Calcium: 10 mg/dL (ref 8.9–10.3)
Chloride: 108 mmol/L (ref 98–111)
Creatinine: 0.87 mg/dL (ref 0.44–1.00)
GFR, Est AFR Am: >60 mL/min (ref >60)
GFR, Estimated: >60 mL/min (ref >60)
Glucose, Bld: 91 mg/dL (ref 70–99)
Potassium: 3.6 mmol/L (ref 3.5–5.1)
Sodium: 141 mmol/L (ref 135–145)
Total Bilirubin: 0.8 mg/dL (ref 0.3–1.2)
Total Protein: 8.2 g/dL — ABNORMAL HIGH (ref 6.5–8.1)

## 2020-05-17 LAB — CBC WITH DIFFERENTIAL (CANCER CENTER ONLY)
Abs Immature Granulocytes: 0.02 10*3/uL (ref 0.00–0.07)
Basophils Absolute: 0 10*3/uL (ref 0.0–0.1)
Basophils Relative: 1 %
Eosinophils Absolute: 0.1 10*3/uL (ref 0.0–0.5)
Eosinophils Relative: 3 %
HCT: 42.1 % (ref 36.0–46.0)
Hemoglobin: 14.8 g/dL (ref 12.0–15.0)
Immature Granulocytes: 0 %
Lymphocytes Relative: 25 %
Lymphs Abs: 1.3 10*3/uL (ref 0.7–4.0)
MCH: 31.3 pg (ref 26.0–34.0)
MCHC: 35.2 g/dL (ref 30.0–36.0)
MCV: 89 fL (ref 80.0–100.0)
Monocytes Absolute: 0.5 10*3/uL (ref 0.1–1.0)
Monocytes Relative: 9 %
Neutro Abs: 3.2 10*3/uL (ref 1.7–7.7)
Neutrophils Relative %: 62 %
Platelet Count: 146 10*3/uL — ABNORMAL LOW (ref 150–400)
RBC: 4.73 MIL/uL (ref 3.87–5.11)
RDW: 12.8 % (ref 11.5–15.5)
WBC Count: 5.1 10*3/uL (ref 4.0–10.5)
nRBC: 0 % (ref 0.0–0.2)

## 2020-05-17 LAB — TSH: TSH: 1.448 u[IU]/mL (ref 0.308–3.960)

## 2020-05-17 MED ORDER — SODIUM CHLORIDE 0.9 % IV SOLN
400.0000 mg | Freq: Once | INTRAVENOUS | Status: AC
  Administered 2020-05-17: 400 mg via INTRAVENOUS
  Filled 2020-05-17: qty 16

## 2020-05-17 MED ORDER — SODIUM CHLORIDE 0.9 % IV SOLN
Freq: Once | INTRAVENOUS | Status: AC
  Filled 2020-05-17: qty 250

## 2020-05-17 NOTE — Patient Instructions (Addendum)
[facility] Discharge Instructions for Patients Receiving Chemotherapy  Today you received the following chemotherapy agents Pembrolizumab (KEYTRUDA).  To help prevent nausea and vomiting after your treatment, we encourage you to take your nausea medication as prescribed.   If you develop nausea and vomiting that is not controlled by your nausea medication, call the clinic.   BELOW ARE SYMPTOMS THAT SHOULD BE REPORTED IMMEDIATELY:  *FEVER GREATER THAN 100.5 F  *CHILLS WITH OR WITHOUT FEVER  NAUSEA AND VOMITING THAT IS NOT CONTROLLED WITH YOUR NAUSEA MEDICATION  *UNUSUAL SHORTNESS OF BREATH  *UNUSUAL BRUISING OR BLEEDING  TENDERNESS IN MOUTH AND THROAT WITH OR WITHOUT PRESENCE OF ULCERS  *URINARY PROBLEMS  *BOWEL PROBLEMS  UNUSUAL RASH Items with * indicate a potential emergency and should be followed up as soon as possible.  Feel free to call the clinic should you have any questions or concerns. The clinic phone number is [phone].  Please show the CHEMO ALERT CARD at check-in to the Emergency Department and triage nurse.   

## 2020-05-17 NOTE — Assessment & Plan Note (Signed)
The goals of care is palliative in nature given recurrence of disease We discussed the rationale of staying on course with treatment for now and she is in agreement

## 2020-05-17 NOTE — Assessment & Plan Note (Signed)
I have reviewed multiple CT imaging with the patient and her son She has significant positive response to treatment but with persistent lymphadenopathy noted I recommend we continue on pembrolizumab for at least 3 more months before repeating another CT imaging She is in agreement to proceed Per patient request, we will lengthen the interval of pembrolizumab to every 6 weeks

## 2020-05-17 NOTE — Progress Notes (Signed)
[place] OFFICE PROGRESS NOTE  Patient Care Team: Buzzy Han, MD as PCP - General (Family Medicine)  ASSESSMENT & PLAN:  Endometrial cancer [facility]) I have reviewed multiple CT imaging with the patient and her son She has significant positive response to treatment but with persistent lymphadenopathy noted I recommend we continue on pembrolizumab for at least 3 more months before repeating another CT imaging She is in agreement to proceed Per patient request, we will lengthen the interval of pembrolizumab to every 6 weeks  Goals of care, counseling/discussion The goals of care is palliative in nature given recurrence of disease We discussed the rationale of staying on course with treatment for now and she is in agreement   No orders of the defined types were placed in this encounter.   All questions were answered. The patient knows to call the clinic with any problems, questions or concerns. The total time spent in the appointment was 20 minutes encounter with patients including review of chart and various tests results, discussions about plan of care and coordination of care plan   [name], MD 05/17/2020 5:22 PM  INTERVAL HISTORY: Please see below for problem oriented charting. She returns for further follow-up She denies pain No recent nausea or changes in bowel habits No side effects of treatment so far  SUMMARY OF ONCOLOGIC HISTORY: Oncology History Overview Note  MMR - Abnormal   Endometrial cancer ([place])  06/01/2013 Initial Diagnosis   Endometrial cancer   06/01/2013 Initial Biopsy   EMB for AUB Endometrium, biopsy - POSITIVE FOR ENDOMETRIAL ADENOCARCINOMA, SEE COMMENT. Microscopic Comment As sampled the endometrial adenocarcinoma appears to be endometrioid type, FIGO Grade I.   07/21/2013 Surgery   PREOPERATIVE DIAGNOSIS: Grade 1 endometrial carcinoma. Uterine fibroids    PROCEDURE: Total abdominal hysterectomy, bilateral  salpingo-oophorectomy, pelvic lymphadenectomy   SURGEON: [name], M.D  SURGICAL FINDINGS: At the time of exploratory laparotomy the uterus was approximately 16-18 weeks size with multiple subserosal fibroids. Tubes and ovaries were normal. On frozen section patient had endometrial carcinoma invading only the inner half of the myometrium. Exploration the upper abdomen revealed no evidence of metastatic disease. There was no pelvic or para-aortic lymphadenopathy.     07/21/2013 Pathologic Stage   1. Uterus +/- tubes/ovaries, neoplastic - INVASIVE ENDOMETRIOID CARCINOMA, FIGO GRADE II, WITH SQUAMOUS DIFFERENTIATION, CONFINED WITHIN INNER HALF OF THE MYOMETRIUM. - LEIOMYOMATA AND ADENOMYOSIS. - CERVIX: BENIGN SQUAMOUS MUCOSA AND ENDOCERVICAL MUCOSA, NO DYSPLASIA OR MALIGNANCY. - BILATERAL OVARIES AND FALLOPIAN TUBES: NO PATHOLOGIC ABNORMALITIES. 2. Lymph nodes, regional resection, left pelvic - FOUR LYMPH NODES, NO EVIDENCE OF METASTATIC CARCINOMA (0/4). 3. Lymph nodes, regional resection, right pelvic - THREE LYMPH NODES, NEGATIVE FOR METASTATIC CARCINOMA (0/3). Microscopic Comment 1. ONCOLOGY TABLE - UTERUS, CARCINOMA OR CARCINOSARCOMA Specimen: Uterus, cervix, bilateral ovaries and fallopian tubes Procedure: Total hysterectomy and bilateral salpingo-oophorectomy Lymph node sampling performed: Yes Specimen integrity: Intact Maximum tumor size (cm): 2.4 cm, gross measurement Histologic type: Invasive endometrioid carcinoma with squamous differentiation Grade: FIGO II Myometrial invasion: 1.1 cm where myometrium is 3.7 cm in thickness Cervical stromal involvement: Not identified Extent of involvement of other organs: No Lymph vascular invasion: Not identified Peritoneal washings: Negative (Please correlate with [id number]) Lymph nodes: number examined 7; number positive 0 Pelvic lymph nodes: 0 involved of 7 lymph nodes Para-aortic lymph nodes: N/A TNM code: pT1a, pN0 FIGO  Stage (based on pathologic findings, needs clinical correlation): IA   09/22/2019 Imaging   CT A/P: There is a left paraspinous  mass which is poorly defined and low-density measuring 3 x 2.2 by 5.5 cm. Deviates the left ureter laterally. No additional periaortic masses are identified.   10/13/2019 Pathology Results   A. LYMPH NODE, LEFT PARASPINAL, NEEDLE CORE BIOPSY: - Poorly differentiated carcinoma. COMMENT: Poorly differentiated carcinoma is present in the background of a majority of necrosis and fibrosis tissue. No distinct nodal tissue is identified. Immunohistochemistry for CK7, PAX 8 and ER is positive. CK20, TTF-1, CDX-2 and GATA-3 are negative. The immunophenotype is compatible with the provided clinical history of gynecologic cancer.   10/19/2019 Cancer Staging   Staging form: Corpus Uteri - Carcinoma and Carcinosarcoma, AJCC 8th Edition - Pathologic: Stage IIIC2 (pT1a, pN2a, cM0) - Signed by [name], MD on 10/29/2019    Genetic Testing   Patient has genetic testing done for MMR. Results revealed patient has the following mutation(s): MMR - Abnormal   10/27/2019 PET scan   1. Poorly marginated hypermetabolic nodal conglomerate in the left para-aortic and left common iliac chains, compatible with nodal metastatic disease. 2. No additional hypermetabolic sites of metastatic disease. 3. Right upper lobe 2 mm solid pulmonary nodule, below PET resolution, recommend attention on follow-up chest CT in 3-6 months. 4. Chronic findings include: Aortic Atherosclerosis (ICD10-I70.0). Coronary atherosclerosis. Mild left colonic diverticulosis. Mild biliary and pancreatic duct dilation of uncertain etiology, unchanged since 09/22/2019 CT.   11/10/2019 -  Chemotherapy   The patient had pembrolizumab (KEYTRUDA) 200 mg in sodium chloride 0.9 % 50 mL chemo infusion, 200 mg, Intravenous, Once, 10 of 11 cycles Dose modification: 400 mg (original dose 200 mg, Cycle 10, Reason: Other (see comments),  Comment: patient's preference) Administration: 200 mg (11/10/2019), 200 mg (12/22/2019), 200 mg (01/12/2020), 200 mg (02/23/2020), 200 mg (12/01/2019), 200 mg (02/02/2020), 200 mg (03/17/2020), 200 mg (04/05/2020), 200 mg (04/26/2020), 400 mg (05/17/2020)  for chemotherapy treatment.    02/22/2020 PET scan   1. Significant partial metabolic response. Left para-aortic and left common iliac adenopathy is decreased in size and significantly decreased in metabolism. 2. No new or progressive hypermetabolic metastatic disease. 3. Aortic Atherosclerosis (ICD10-I70.0). Additional chronic findings as detailed.   05/16/2020 Imaging   Further decrease in mild left paraaortic and left common iliac lymphadenopathy.   No new or progressive disease within the abdomen or pelvis.   Aortic Atherosclerosis (ICD10-I70.0).       REVIEW OF SYSTEMS:   Constitutional: Denies fevers, chills or abnormal weight loss Eyes: Denies blurriness of vision Ears, nose, mouth, throat, and face: Denies mucositis or sore throat Respiratory: Denies cough, dyspnea or wheezes Cardiovascular: Denies palpitation, chest discomfort or lower extremity swelling Gastrointestinal:  Denies nausea, heartburn or change in bowel habits Skin: Denies abnormal skin rashes Lymphatics: Denies new lymphadenopathy or easy bruising Neurological:Denies numbness, tingling or new weaknesses Behavioral/Psych: Mood is stable, no new changes  All other systems were reviewed with the patient and are negative.  I have reviewed the past medical history, past surgical history, social history and family history with the patient and they are unchanged from previous note.  ALLERGIES:  is allergic to iron, iron dextran, penicillins, amoxicillin, ampicillin, codeine, diamox [acetazolamide], penicillin g, and other.  MEDICATIONS:  Current Outpatient Medications  Medication Sig Dispense Refill  . amLODipine (NORVASC) 10 MG tablet Take by mouth.    Marland Kitchen atorvastatin  (LIPITOR) 20 MG tablet Take 1 tablet (20 mg total) by mouth daily. 30 tablet 3  . brimonidine (ALPHAGAN) 0.2 % ophthalmic solution     . busPIRone (BUSPAR) 7.5  MG tablet Take 7.5 mg by mouth 2 (two) times daily.    . dorzolamide-timolol (COSOPT) 22.3-6.8 MG/ML ophthalmic solution Place 1 drop into the left eye 2 (two) times daily.    . hydrochlorothiazide (HYDRODIURIL) 25 MG tablet Take 1 tablet (25 mg total) by mouth daily. Discontinue amlodipine 90 tablet 1  . latanoprost (XALATAN) 0.005 % ophthalmic solution Apply to eye.    Marland Kitchen lisinopril (ZESTRIL) 40 MG tablet Take 1 tablet by mouth once daily 90 tablet 0  . magnesium 30 MG tablet Take by mouth daily.    . metoprolol tartrate (LOPRESSOR) 25 MG tablet Take 1 tablet (25 mg total) by mouth 2 (two) times daily. 180 tablet 1  . Netarsudil Dimesylate 0.02 % SOLN Apply to eye.    . ondansetron (ZOFRAN) 8 MG tablet Take 1 tablet (8 mg total) by mouth every 8 (eight) hours as needed for nausea. 30 tablet 3  . prednisoLONE acetate (PRED FORTE) 1 % ophthalmic suspension Apply to eye.    . prochlorperazine (COMPAZINE) 10 MG tablet Take 1 tablet (10 mg total) by mouth every 6 (six) hours as needed for nausea or vomiting. 30 tablet 0  . VYZULTA 0.024 % SOLN      No current facility-administered medications for this visit.    PHYSICAL EXAMINATION: ECOG PERFORMANCE STATUS: 0 - Asymptomatic  Vitals:   05/17/20 1111  BP: (!) 168/75  Pulse: 98  Resp: 20  Temp: 98.3 F (36.8 C)  SpO2: 100%   Filed Weights   05/17/20 1111  Weight: 157 lb 3.2 oz (71.3 kg)    GENERAL:alert, no distress and comfortable SKIN: skin color, texture, turgor are normal, no rashes or significant lesions EYES: normal, Conjunctiva are pink and non-injected, sclera clear OROPHARYNX:no exudate, no erythema and lips, buccal mucosa, and tongue normal  NECK: supple, thyroid normal size, non-tender, without nodularity LYMPH:  no palpable lymphadenopathy in the cervical, axillary  or inguinal LUNGS: clear to auscultation and percussion with normal breathing effort HEART: regular rate & rhythm and no murmurs and no lower extremity edema ABDOMEN:abdomen soft, non-tender and normal bowel sounds Musculoskeletal:no cyanosis of digits and no clubbing  NEURO: alert & oriented x 3 with fluent speech, no focal motor/sensory deficits  LABORATORY DATA:  I have reviewed the data as listed    Component Value Date/Time   NA 141 05/17/2020 1043   NA 139 03/03/2019 0934   NA 142 07/09/2013 1144   K 3.6 05/17/2020 1043   K 3.7 07/09/2013 1144   CL 108 05/17/2020 1043   CO2 23 05/17/2020 1043   CO2 23 07/09/2013 1144   GLUCOSE 91 05/17/2020 1043   GLUCOSE 115 07/09/2013 1144   BUN 14 05/17/2020 1043   BUN 25 03/03/2019 0934   BUN 14.4 07/09/2013 1144   CREATININE 0.87 05/17/2020 1043   CREATININE 0.82 01/02/2017 1159   CREATININE 0.8 07/09/2013 1144   CALCIUM 10.0 05/17/2020 1043   CALCIUM 9.3 07/09/2013 1144   PROT 8.2 (H) 05/17/2020 1043   PROT 8.0 03/03/2019 0934   PROT 7.8 07/09/2013 1144   ALBUMIN 4.3 05/17/2020 1043   ALBUMIN 4.7 03/03/2019 0934   ALBUMIN 3.7 07/09/2013 1144   AST 18 05/17/2020 1043   AST 9 07/09/2013 1144   ALT 13 05/17/2020 1043   ALT <6 07/09/2013 1144   ALKPHOS 101 05/17/2020 1043   ALKPHOS 58 07/09/2013 1144   BILITOT 0.8 05/17/2020 1043   BILITOT 0.40 07/09/2013 1144   GFRNONAA >60 05/17/2020 1043  GFRNONAA 74 02/01/2015 1238   GFRAA >60 05/17/2020 1043   GFRAA 85 02/01/2015 1238    No results found for: SPEP, UPEP  Lab Results  Component Value Date   WBC 5.1 05/17/2020   NEUTROABS 3.2 05/17/2020   HGB 14.8 05/17/2020   HCT 42.1 05/17/2020   MCV 89.0 05/17/2020   PLT 146 (L) 05/17/2020      Chemistry      Component Value Date/Time   NA 141 05/17/2020 1043   NA 139 03/03/2019 0934   NA 142 07/09/2013 1144   K 3.6 05/17/2020 1043   K 3.7 07/09/2013 1144   CL 108 05/17/2020 1043   CO2 23 05/17/2020 1043   CO2 23  07/09/2013 1144   BUN 14 05/17/2020 1043   BUN 25 03/03/2019 0934   BUN 14.4 07/09/2013 1144   CREATININE 0.87 05/17/2020 1043   CREATININE 0.82 01/02/2017 1159   CREATININE 0.8 07/09/2013 1144      Component Value Date/Time   CALCIUM 10.0 05/17/2020 1043   CALCIUM 9.3 07/09/2013 1144   ALKPHOS 101 05/17/2020 1043   ALKPHOS 58 07/09/2013 1144   AST 18 05/17/2020 1043   AST 9 07/09/2013 1144   ALT 13 05/17/2020 1043   ALT <6 07/09/2013 1144   BILITOT 0.8 05/17/2020 1043   BILITOT 0.40 07/09/2013 1144       RADIOGRAPHIC STUDIES: I have reviewed multiple CT imaging with the patient and her son I have personally reviewed the radiological images as listed and agreed with the findings in the report. CT ABDOMEN PELVIS W CONTRAST  Result Date: 05/16/2020 CLINICAL DATA:  Follow-up recurrent endometrial carcinoma. Ongoing immunotherapy. EXAM: CT ABDOMEN AND PELVIS WITH CONTRAST TECHNIQUE: Multidetector CT imaging of the abdomen and pelvis was performed using the standard protocol following bolus administration of intravenous contrast. CONTRAST:  161m OMNIPAQUE IOHEXOL 300 MG/ML  SOLN COMPARISON:  PET-CT on 02/22/2020 FINDINGS: Lower Chest: No acute findings. Hepatobiliary: No hepatic masses identified. A few tiny cysts are again seen in the right lobe. Gallbladder is unremarkable. Mild diffuse biliary ductal dilatation is again noted, without significant change. Pancreas: No mass or inflammatory changes. No evidence of pancreatic ductal dilatation. Spleen: Within normal limits in size and appearance. Adrenals/Urinary Tract: No masses identified. A few tiny sub-cm left renal cysts are incidentally noted. No evidence of ureteral calculi or hydronephrosis. Unremarkable unopacified urinary bladder. Stomach/Bowel: No evidence of obstruction, inflammatory process or abnormal fluid collections. Normal appendix visualized. Vascular/Lymphatic: Mild retroperitoneal lymphadenopathy in left paraaortic region  shows further decrease in size, currently measuring 1.6 cm on image 44/2, compared to 2.3 cm previously. Mild lymphadenopathy in left common iliac chain also shows mild decrease, currently measuring 0.8 cm on image 46/2, compared to 1.0 cm previously. No new or increased sites of lymphadenopathy identified. No abdominal aortic aneurysm. Aortic atherosclerosis noted. Reproductive: Prior hysterectomy noted. Adnexal regions are unremarkable in appearance. Other:  None. Musculoskeletal:  No suspicious bone lesions identified. IMPRESSION: Further decrease in mild left paraaortic and left common iliac lymphadenopathy. No new or progressive disease within the abdomen or pelvis. Aortic Atherosclerosis (ICD10-I70.0). Electronically Signed   By: JMarlaine HindM.D.   On: 05/16/2020 16:19

## 2020-06-28 ENCOUNTER — Other Ambulatory Visit: Payer: Self-pay | Admitting: Hematology and Oncology

## 2020-06-28 ENCOUNTER — Encounter: Payer: Self-pay | Admitting: Hematology and Oncology

## 2020-06-28 ENCOUNTER — Inpatient Hospital Stay (HOSPITAL_BASED_OUTPATIENT_CLINIC_OR_DEPARTMENT_OTHER): Payer: Medicare Other | Admitting: Hematology and Oncology

## 2020-06-28 ENCOUNTER — Other Ambulatory Visit: Payer: Self-pay

## 2020-06-28 ENCOUNTER — Inpatient Hospital Stay: Payer: Medicare Other

## 2020-06-28 ENCOUNTER — Ambulatory Visit: Payer: Medicare Other

## 2020-06-28 ENCOUNTER — Inpatient Hospital Stay: Payer: Medicare Other | Attending: Gynecologic Oncology

## 2020-06-28 DIAGNOSIS — Z299 Encounter for prophylactic measures, unspecified: Secondary | ICD-10-CM | POA: Diagnosis not present

## 2020-06-28 DIAGNOSIS — Z5111 Encounter for antineoplastic chemotherapy: Secondary | ICD-10-CM | POA: Insufficient documentation

## 2020-06-28 DIAGNOSIS — Z79899 Other long term (current) drug therapy: Secondary | ICD-10-CM | POA: Diagnosis not present

## 2020-06-28 DIAGNOSIS — Z7189 Other specified counseling: Secondary | ICD-10-CM

## 2020-06-28 DIAGNOSIS — I10 Essential (primary) hypertension: Secondary | ICD-10-CM | POA: Diagnosis not present

## 2020-06-28 DIAGNOSIS — C541 Malignant neoplasm of endometrium: Secondary | ICD-10-CM

## 2020-06-28 DIAGNOSIS — I878 Other specified disorders of veins: Secondary | ICD-10-CM | POA: Diagnosis not present

## 2020-06-28 DIAGNOSIS — Z23 Encounter for immunization: Secondary | ICD-10-CM

## 2020-06-28 HISTORY — DX: Encounter for prophylactic measures, unspecified: Z29.9

## 2020-06-28 LAB — CMP (CANCER CENTER ONLY)
ALT: 10 U/L (ref 0–44)
AST: 15 U/L (ref 15–41)
Albumin: 4 g/dL (ref 3.5–5.0)
Alkaline Phosphatase: 95 U/L (ref 38–126)
Anion gap: 5 (ref 5–15)
BUN: 21 mg/dL (ref 8–23)
CO2: 26 mmol/L (ref 22–32)
Calcium: 9.6 mg/dL (ref 8.9–10.3)
Chloride: 108 mmol/L (ref 98–111)
Creatinine: 0.91 mg/dL (ref 0.44–1.00)
GFR, Est AFR Am: >60 mL/min (ref >60)
GFR, Estimated: >60 mL/min (ref >60)
Glucose, Bld: 91 mg/dL (ref 70–99)
Potassium: 3.9 mmol/L (ref 3.5–5.1)
Sodium: 139 mmol/L (ref 135–145)
Total Bilirubin: 0.7 mg/dL (ref 0.3–1.2)
Total Protein: 7.9 g/dL (ref 6.5–8.1)

## 2020-06-28 LAB — CBC WITH DIFFERENTIAL (CANCER CENTER ONLY)
Abs Immature Granulocytes: 0.02 10*3/uL (ref 0.00–0.07)
Basophils Absolute: 0 10*3/uL (ref 0.0–0.1)
Basophils Relative: 1 %
Eosinophils Absolute: 0.1 10*3/uL (ref 0.0–0.5)
Eosinophils Relative: 2 %
HCT: 39.7 % (ref 36.0–46.0)
Hemoglobin: 13.7 g/dL (ref 12.0–15.0)
Immature Granulocytes: 0 %
Lymphocytes Relative: 20 %
Lymphs Abs: 1 10*3/uL (ref 0.7–4.0)
MCH: 30.7 pg (ref 26.0–34.0)
MCHC: 34.5 g/dL (ref 30.0–36.0)
MCV: 89 fL (ref 80.0–100.0)
Monocytes Absolute: 0.4 10*3/uL (ref 0.1–1.0)
Monocytes Relative: 7 %
Neutro Abs: 3.6 10*3/uL (ref 1.7–7.7)
Neutrophils Relative %: 70 %
Platelet Count: 157 10*3/uL (ref 150–400)
RBC: 4.46 MIL/uL (ref 3.87–5.11)
RDW: 13.2 % (ref 11.5–15.5)
WBC Count: 5.2 10*3/uL (ref 4.0–10.5)
nRBC: 0 % (ref 0.0–0.2)

## 2020-06-28 LAB — TSH: TSH: 0.712 u[IU]/mL (ref 0.308–3.960)

## 2020-06-28 MED ORDER — SODIUM CHLORIDE 0.9 % IV SOLN
Freq: Once | INTRAVENOUS | Status: AC
  Filled 2020-06-28: qty 250

## 2020-06-28 MED ORDER — SODIUM CHLORIDE 0.9 % IV SOLN
400.0000 mg | Freq: Once | INTRAVENOUS | Status: AC
  Administered 2020-06-28: 400 mg via INTRAVENOUS
  Filled 2020-06-28: qty 16

## 2020-06-28 NOTE — Assessment & Plan Note (Signed)
We discussed the importance of preventive care and reviewed the vaccination programs. She does not have any prior allergic reactions to Covid-19 vaccination. She agrees to proceed with booster dose of Covid-19 vaccination today and we will administer it today at the clinic.  

## 2020-06-28 NOTE — Patient Instructions (Signed)
[facility] Discharge Instructions for Patients Receiving Chemotherapy  Today you received the following chemotherapy agents Pembrolizumab (KEYTRUDA).  To help prevent nausea and vomiting after your treatment, we encourage you to take your nausea medication as prescribed.   If you develop nausea and vomiting that is not controlled by your nausea medication, call the clinic.   BELOW ARE SYMPTOMS THAT SHOULD BE REPORTED IMMEDIATELY:  *FEVER GREATER THAN 100.5 F  *CHILLS WITH OR WITHOUT FEVER  NAUSEA AND VOMITING THAT IS NOT CONTROLLED WITH YOUR NAUSEA MEDICATION  *UNUSUAL SHORTNESS OF BREATH  *UNUSUAL BRUISING OR BLEEDING  TENDERNESS IN MOUTH AND THROAT WITH OR WITHOUT PRESENCE OF ULCERS  *URINARY PROBLEMS  *BOWEL PROBLEMS  UNUSUAL RASH Items with * indicate a potential emergency and should be followed up as soon as possible.  Feel free to call the clinic should you have any questions or concerns. The clinic phone number is [phone].  Please show the CHEMO ALERT CARD at check-in to the Emergency Department and triage nurse.   

## 2020-06-28 NOTE — Progress Notes (Signed)
[place] OFFICE PROGRESS NOTE  Patient Care Team: Buzzy Han, MD as PCP - General (Family Medicine)  ASSESSMENT & PLAN:  Endometrial cancer [facility]) I recommend we continue on pembrolizumab until November before repeating another CT imaging She is in agreement to proceed We will continue the interval of pembrolizumab of every 6 weeks  Poor venous access We have a long discussion about the risk and benefits of port placement The patient would like to continue trying without a port We have a long discussion about the use of port for long-term reasons even if she were to stop treatment in November  Essential hypertension She is noted to have elevated blood pressure Her blood pressure monitoring at home are actually within normal limits   Preventive measure We discussed the importance of preventive care and reviewed the vaccination programs. She does not have any prior allergic reactions to Covid-19 vaccination. She agrees to proceed with booster dose of Covid-19 vaccination today and we will administer it today at the clinic.    No orders of the defined types were placed in this encounter.   All questions were answered. The patient knows to call the clinic with any problems, questions or concerns. The total time spent in the appointment was 20 minutes encounter with patients including review of chart and various tests results, discussions about plan of care and coordination of care plan   [name], MD 06/28/2020 12:53 PM  INTERVAL HISTORY: Please see below for problem oriented charting. She returns with her son for further follow-up Her appointment today is almost delayed by an hour due to poor venous access She has no symptoms from her cancer No infusion reaction She desired to get vaccinated with booster shot today  SUMMARY OF ONCOLOGIC HISTORY: Oncology History Overview Note  MMR - Abnormal   Endometrial cancer ([place])  06/01/2013 Initial  Diagnosis   Endometrial cancer   06/01/2013 Initial Biopsy   EMB for AUB Endometrium, biopsy - POSITIVE FOR ENDOMETRIAL ADENOCARCINOMA, SEE COMMENT. Microscopic Comment As sampled the endometrial adenocarcinoma appears to be endometrioid type, FIGO Grade I.   07/21/2013 Surgery   PREOPERATIVE DIAGNOSIS: Grade 1 endometrial carcinoma. Uterine fibroids    PROCEDURE: Total abdominal hysterectomy, bilateral salpingo-oophorectomy, pelvic lymphadenectomy   SURGEON: [name], M.D  SURGICAL FINDINGS: At the time of exploratory laparotomy the uterus was approximately 16-18 weeks size with multiple subserosal fibroids. Tubes and ovaries were normal. On frozen section patient had endometrial carcinoma invading only the inner half of the myometrium. Exploration the upper abdomen revealed no evidence of metastatic disease. There was no pelvic or para-aortic lymphadenopathy.     07/21/2013 Pathologic Stage   1. Uterus +/- tubes/ovaries, neoplastic - INVASIVE ENDOMETRIOID CARCINOMA, FIGO GRADE II, WITH SQUAMOUS DIFFERENTIATION, CONFINED WITHIN INNER HALF OF THE MYOMETRIUM. - LEIOMYOMATA AND ADENOMYOSIS. - CERVIX: BENIGN SQUAMOUS MUCOSA AND ENDOCERVICAL MUCOSA, NO DYSPLASIA OR MALIGNANCY. - BILATERAL OVARIES AND FALLOPIAN TUBES: NO PATHOLOGIC ABNORMALITIES. 2. Lymph nodes, regional resection, left pelvic - FOUR LYMPH NODES, NO EVIDENCE OF METASTATIC CARCINOMA (0/4). 3. Lymph nodes, regional resection, right pelvic - THREE LYMPH NODES, NEGATIVE FOR METASTATIC CARCINOMA (0/3). Microscopic Comment 1. ONCOLOGY TABLE - UTERUS, CARCINOMA OR CARCINOSARCOMA Specimen: Uterus, cervix, bilateral ovaries and fallopian tubes Procedure: Total hysterectomy and bilateral salpingo-oophorectomy Lymph node sampling performed: Yes Specimen integrity: Intact Maximum tumor size (cm): 2.4 cm, gross measurement Histologic type: Invasive endometrioid carcinoma with squamous differentiation Grade: FIGO  II Myometrial invasion: 1.1 cm where myometrium is 3.7 cm in thickness  Cervical stromal involvement: Not identified Extent of involvement of other organs: No Lymph vascular invasion: Not identified Peritoneal washings: Negative (Please correlate with [id number]) Lymph nodes: number examined 7; number positive 0 Pelvic lymph nodes: 0 involved of 7 lymph nodes Para-aortic lymph nodes: N/A TNM code: pT1a, pN0 FIGO Stage (based on pathologic findings, needs clinical correlation): IA   09/22/2019 Imaging   CT A/P: There is a left paraspinous mass which is poorly defined and low-density measuring 3 x 2.2 by 5.5 cm. Deviates the left ureter laterally. No additional periaortic masses are identified.   10/13/2019 Pathology Results   A. LYMPH NODE, LEFT PARASPINAL, NEEDLE CORE BIOPSY: - Poorly differentiated carcinoma. COMMENT: Poorly differentiated carcinoma is present in the background of a majority of necrosis and fibrosis tissue. No distinct nodal tissue is identified. Immunohistochemistry for CK7, PAX 8 and ER is positive. CK20, TTF-1, CDX-2 and GATA-3 are negative. The immunophenotype is compatible with the provided clinical history of gynecologic cancer.   10/19/2019 Cancer Staging   Staging form: Corpus Uteri - Carcinoma and Carcinosarcoma, AJCC 8th Edition - Pathologic: Stage IIIC2 (pT1a, pN2a, cM0) - Signed by [name], MD on 10/29/2019    Genetic Testing   Patient has genetic testing done for MMR. Results revealed patient has the following mutation(s): MMR - Abnormal   10/27/2019 PET scan   1. Poorly marginated hypermetabolic nodal conglomerate in the left para-aortic and left common iliac chains, compatible with nodal metastatic disease. 2. No additional hypermetabolic sites of metastatic disease. 3. Right upper lobe 2 mm solid pulmonary nodule, below PET resolution, recommend attention on follow-up chest CT in 3-6 months. 4. Chronic findings include: Aortic Atherosclerosis  (ICD10-I70.0). Coronary atherosclerosis. Mild left colonic diverticulosis. Mild biliary and pancreatic duct dilation of uncertain etiology, unchanged since 09/22/2019 CT.   11/10/2019 -  Chemotherapy   The patient had pembrolizumab (KEYTRUDA) 200 mg in sodium chloride 0.9 % 50 mL chemo infusion, 200 mg, Intravenous, Once, 11 of 12 cycles Dose modification: 400 mg (original dose 200 mg, Cycle 10, Reason: Other (see comments), Comment: patient's preference) Administration: 200 mg (11/10/2019), 200 mg (12/22/2019), 200 mg (01/12/2020), 200 mg (02/23/2020), 200 mg (12/01/2019), 200 mg (02/02/2020), 200 mg (03/17/2020), 200 mg (04/05/2020), 200 mg (04/26/2020), 400 mg (05/17/2020)  for chemotherapy treatment.    02/22/2020 PET scan   1. Significant partial metabolic response. Left para-aortic and left common iliac adenopathy is decreased in size and significantly decreased in metabolism. 2. No new or progressive hypermetabolic metastatic disease. 3. Aortic Atherosclerosis (ICD10-I70.0). Additional chronic findings as detailed.   05/16/2020 Imaging   Further decrease in mild left paraaortic and left common iliac lymphadenopathy.   No new or progressive disease within the abdomen or pelvis.   Aortic Atherosclerosis (ICD10-I70.0).       REVIEW OF SYSTEMS:   Constitutional: Denies fevers, chills or abnormal weight loss Eyes: Denies blurriness of vision Ears, nose, mouth, throat, and face: Denies mucositis or sore throat Respiratory: Denies cough, dyspnea or wheezes Cardiovascular: Denies palpitation, chest discomfort or lower extremity swelling Gastrointestinal:  Denies nausea, heartburn or change in bowel habits Skin: Denies abnormal skin rashes Lymphatics: Denies new lymphadenopathy or easy bruising Neurological:Denies numbness, tingling or new weaknesses Behavioral/Psych: Mood is stable, no new changes  All other systems were reviewed with the patient and are negative.  I have reviewed the past medical  history, past surgical history, social history and family history with the patient and they are unchanged from previous note.  ALLERGIES:  is allergic  to iron, iron dextran, penicillins, amoxicillin, ampicillin, codeine, diamox [acetazolamide], penicillin g, and other.  MEDICATIONS:  Current Outpatient Medications  Medication Sig Dispense Refill  . amLODipine (NORVASC) 10 MG tablet Take by mouth.    Marland Kitchen atorvastatin (LIPITOR) 20 MG tablet Take 1 tablet (20 mg total) by mouth daily. 30 tablet 3  . brimonidine (ALPHAGAN) 0.2 % ophthalmic solution     . busPIRone (BUSPAR) 7.5 MG tablet Take 7.5 mg by mouth 2 (two) times daily.    . dorzolamide-timolol (COSOPT) 22.3-6.8 MG/ML ophthalmic solution Place 1 drop into the left eye 2 (two) times daily.    . hydrochlorothiazide (HYDRODIURIL) 25 MG tablet Take 1 tablet (25 mg total) by mouth daily. Discontinue amlodipine 90 tablet 1  . latanoprost (XALATAN) 0.005 % ophthalmic solution Apply to eye.    Marland Kitchen lisinopril (ZESTRIL) 40 MG tablet Take 1 tablet by mouth once daily 90 tablet 0  . magnesium 30 MG tablet Take by mouth daily.    . metoprolol tartrate (LOPRESSOR) 25 MG tablet Take 1 tablet (25 mg total) by mouth 2 (two) times daily. 180 tablet 1  . Netarsudil Dimesylate 0.02 % SOLN Apply to eye.    . ondansetron (ZOFRAN) 8 MG tablet Take 1 tablet (8 mg total) by mouth every 8 (eight) hours as needed for nausea. 30 tablet 3  . prednisoLONE acetate (PRED FORTE) 1 % ophthalmic suspension Apply to eye.    . prochlorperazine (COMPAZINE) 10 MG tablet Take 1 tablet (10 mg total) by mouth every 6 (six) hours as needed for nausea or vomiting. 30 tablet 0  . VYZULTA 0.024 % SOLN      No current facility-administered medications for this visit.    PHYSICAL EXAMINATION: ECOG PERFORMANCE STATUS: 1 - Symptomatic but completely ambulatory  Vitals:   06/28/20 1116  BP: (!) 157/77  Pulse: 73  Resp: 18  Temp: 97.9 F (36.6 C)  SpO2: 100%   Filed Weights    06/28/20 1116  Weight: 158 lb 12.8 oz (72 kg)    GENERAL:alert, no distress and comfortable NEURO: alert & oriented x 3 with fluent speech, no focal motor/sensory deficits  LABORATORY DATA:  I have reviewed the data as listed    Component Value Date/Time   NA 139 06/28/2020 1031   NA 139 03/03/2019 0934   NA 142 07/09/2013 1144   K 3.9 06/28/2020 1031   K 3.7 07/09/2013 1144   CL 108 06/28/2020 1031   CO2 26 06/28/2020 1031   CO2 23 07/09/2013 1144   GLUCOSE 91 06/28/2020 1031   GLUCOSE 115 07/09/2013 1144   BUN 21 06/28/2020 1031   BUN 25 03/03/2019 0934   BUN 14.4 07/09/2013 1144   CREATININE 0.91 06/28/2020 1031   CREATININE 0.82 01/02/2017 1159   CREATININE 0.8 07/09/2013 1144   CALCIUM 9.6 06/28/2020 1031   CALCIUM 9.3 07/09/2013 1144   PROT 7.9 06/28/2020 1031   PROT 8.0 03/03/2019 0934   PROT 7.8 07/09/2013 1144   ALBUMIN 4.0 06/28/2020 1031   ALBUMIN 4.7 03/03/2019 0934   ALBUMIN 3.7 07/09/2013 1144   AST 15 06/28/2020 1031   AST 9 07/09/2013 1144   ALT 10 06/28/2020 1031   ALT <6 07/09/2013 1144   ALKPHOS 95 06/28/2020 1031   ALKPHOS 58 07/09/2013 1144   BILITOT 0.7 06/28/2020 1031   BILITOT 0.40 07/09/2013 1144   GFRNONAA >60 06/28/2020 1031   GFRNONAA 74 02/01/2015 1238   GFRAA >60 06/28/2020 1031   GFRAA 85 02/01/2015  1238    No results found for: SPEP, UPEP  Lab Results  Component Value Date   WBC 5.2 06/28/2020   NEUTROABS 3.6 06/28/2020   HGB 13.7 06/28/2020   HCT 39.7 06/28/2020   MCV 89.0 06/28/2020   PLT 157 06/28/2020      Chemistry      Component Value Date/Time   NA 139 06/28/2020 1031   NA 139 03/03/2019 0934   NA 142 07/09/2013 1144   K 3.9 06/28/2020 1031   K 3.7 07/09/2013 1144   CL 108 06/28/2020 1031   CO2 26 06/28/2020 1031   CO2 23 07/09/2013 1144   BUN 21 06/28/2020 1031   BUN 25 03/03/2019 0934   BUN 14.4 07/09/2013 1144   CREATININE 0.91 06/28/2020 1031   CREATININE 0.82 01/02/2017 1159   CREATININE 0.8  07/09/2013 1144      Component Value Date/Time   CALCIUM 9.6 06/28/2020 1031   CALCIUM 9.3 07/09/2013 1144   ALKPHOS 95 06/28/2020 1031   ALKPHOS 58 07/09/2013 1144   AST 15 06/28/2020 1031   AST 9 07/09/2013 1144   ALT 10 06/28/2020 1031   ALT <6 07/09/2013 1144   BILITOT 0.7 06/28/2020 1031   BILITOT 0.40 07/09/2013 1144

## 2020-06-28 NOTE — Assessment & Plan Note (Signed)
We have a long discussion about the risk and benefits of port placement The patient would like to continue trying without a port We have a long discussion about the use of port for long-term reasons even if she were to stop treatment in November

## 2020-06-28 NOTE — Assessment & Plan Note (Signed)
She is noted to have elevated blood pressure Her blood pressure monitoring at home are actually within normal limits  

## 2020-06-28 NOTE — Assessment & Plan Note (Signed)
I recommend we continue on pembrolizumab until November before repeating another CT imaging She is in agreement to proceed We will continue the interval of pembrolizumab of every 6 weeks

## 2020-06-28 NOTE — Progress Notes (Signed)
   Covid-19 Vaccination Clinic  Name:  Stephanie [name]    MRN: [id number] DOB: 30-Jan-1946  06/28/2020  Stephanie [name] was observed post Covid-19 immunization for 15 minutes without incident. She was provided with Vaccine Information Sheet and instruction to access the V-Safe system.   Stephanie [name] was instructed to call 911 with any severe reactions post vaccine: Marland Kitchen Difficulty breathing  . Swelling of face and throat  . A fast heartbeat  . A bad rash all over body  . Dizziness and weakness

## 2020-08-09 ENCOUNTER — Encounter: Payer: Self-pay | Admitting: Hematology and Oncology

## 2020-08-09 ENCOUNTER — Inpatient Hospital Stay: Payer: Medicare Other | Attending: Gynecologic Oncology

## 2020-08-09 ENCOUNTER — Telehealth: Payer: Self-pay | Admitting: Hematology and Oncology

## 2020-08-09 ENCOUNTER — Inpatient Hospital Stay: Payer: Medicare Other

## 2020-08-09 ENCOUNTER — Other Ambulatory Visit: Payer: Self-pay

## 2020-08-09 ENCOUNTER — Inpatient Hospital Stay (HOSPITAL_BASED_OUTPATIENT_CLINIC_OR_DEPARTMENT_OTHER): Payer: Medicare Other | Admitting: Hematology and Oncology

## 2020-08-09 VITALS — BP 143/85 | HR 96 | Temp 98.4°F | Resp 18 | Ht 66.0 in | Wt 162.4 lb

## 2020-08-09 DIAGNOSIS — Z5111 Encounter for antineoplastic chemotherapy: Secondary | ICD-10-CM | POA: Insufficient documentation

## 2020-08-09 DIAGNOSIS — C541 Malignant neoplasm of endometrium: Secondary | ICD-10-CM

## 2020-08-09 DIAGNOSIS — Z7189 Other specified counseling: Secondary | ICD-10-CM

## 2020-08-09 DIAGNOSIS — D696 Thrombocytopenia, unspecified: Secondary | ICD-10-CM

## 2020-08-09 DIAGNOSIS — I10 Essential (primary) hypertension: Secondary | ICD-10-CM | POA: Diagnosis not present

## 2020-08-09 DIAGNOSIS — Z79899 Other long term (current) drug therapy: Secondary | ICD-10-CM | POA: Insufficient documentation

## 2020-08-09 LAB — CMP (CANCER CENTER ONLY)
ALT: 15 U/L (ref 0–44)
AST: 18 U/L (ref 15–41)
Albumin: 4.2 g/dL (ref 3.5–5.0)
Alkaline Phosphatase: 112 U/L (ref 38–126)
Anion gap: 9 (ref 5–15)
BUN: 23 mg/dL (ref 8–23)
CO2: 24 mmol/L (ref 22–32)
Calcium: 9.6 mg/dL (ref 8.9–10.3)
Chloride: 106 mmol/L (ref 98–111)
Creatinine: 0.93 mg/dL (ref 0.44–1.00)
GFR, Estimated: >60 mL/min (ref >60)
Glucose, Bld: 108 mg/dL — ABNORMAL HIGH (ref 70–99)
Potassium: 3.6 mmol/L (ref 3.5–5.1)
Sodium: 139 mmol/L (ref 135–145)
Total Bilirubin: 0.8 mg/dL (ref 0.3–1.2)
Total Protein: 8 g/dL (ref 6.5–8.1)

## 2020-08-09 LAB — CBC WITH DIFFERENTIAL (CANCER CENTER ONLY)
Abs Immature Granulocytes: 0.01 10*3/uL (ref 0.00–0.07)
Basophils Absolute: 0 10*3/uL (ref 0.0–0.1)
Basophils Relative: 1 %
Eosinophils Absolute: 0.1 10*3/uL (ref 0.0–0.5)
Eosinophils Relative: 2 %
HCT: 41.8 % (ref 36.0–46.0)
Hemoglobin: 14.4 g/dL (ref 12.0–15.0)
Immature Granulocytes: 0 %
Lymphocytes Relative: 26 %
Lymphs Abs: 1.4 10*3/uL (ref 0.7–4.0)
MCH: 30.8 pg (ref 26.0–34.0)
MCHC: 34.4 g/dL (ref 30.0–36.0)
MCV: 89.5 fL (ref 80.0–100.0)
Monocytes Absolute: 0.5 10*3/uL (ref 0.1–1.0)
Monocytes Relative: 9 %
Neutro Abs: 3.4 10*3/uL (ref 1.7–7.7)
Neutrophils Relative %: 62 %
Platelet Count: 148 10*3/uL — ABNORMAL LOW (ref 150–400)
RBC: 4.67 MIL/uL (ref 3.87–5.11)
RDW: 13 % (ref 11.5–15.5)
WBC Count: 5.5 10*3/uL (ref 4.0–10.5)
nRBC: 0 % (ref 0.0–0.2)

## 2020-08-09 LAB — TSH: TSH: 1.112 u[IU]/mL (ref 0.308–3.960)

## 2020-08-09 MED ORDER — SODIUM CHLORIDE 0.9 % IV SOLN
Freq: Once | INTRAVENOUS | Status: AC
  Filled 2020-08-09: qty 250

## 2020-08-09 MED ORDER — SODIUM CHLORIDE 0.9 % IV SOLN
400.0000 mg | Freq: Once | INTRAVENOUS | Status: AC
  Administered 2020-08-09: 400 mg via INTRAVENOUS
  Filled 2020-08-09: qty 16

## 2020-08-09 NOTE — Assessment & Plan Note (Signed)
She is noted to have elevated blood pressure Her blood pressure monitoring at home are actually within normal limits

## 2020-08-09 NOTE — Assessment & Plan Note (Signed)
I recommend we continue on pembrolizumab until December before repeating another CT imaging She is in agreement to proceed We will continue the interval of pembrolizumab of every 6 weeks

## 2020-08-09 NOTE — Patient Instructions (Signed)
[facility] Discharge Instructions for Patients Receiving Chemotherapy  Today you received the following monoclonal antibody agent Pembrolizumab (KEYTRUDA).  To help prevent nausea and vomiting after your treatment, we encourage you to take your nausea medication as prescribed.  If you develop nausea and vomiting that is not controlled by your nausea medication, call the clinic.   BELOW ARE SYMPTOMS THAT SHOULD BE REPORTED IMMEDIATELY:  *FEVER GREATER THAN 100.5 F  *CHILLS WITH OR WITHOUT FEVER  NAUSEA AND VOMITING THAT IS NOT CONTROLLED WITH YOUR NAUSEA MEDICATION  *UNUSUAL SHORTNESS OF BREATH  *UNUSUAL BRUISING OR BLEEDING  TENDERNESS IN MOUTH AND THROAT WITH OR WITHOUT PRESENCE OF ULCERS  *URINARY PROBLEMS  *BOWEL PROBLEMS  UNUSUAL RASH Items with * indicate a potential emergency and should be followed up as soon as possible.  Feel free to call the clinic should you have any questions or concerns. The clinic phone number is [phone].  Please show the CHEMO ALERT CARD at check-in to the Emergency Department and triage nurse.   

## 2020-08-09 NOTE — Assessment & Plan Note (Signed)
The cause could be related to her underlying medical condition. It is mild and there is little change compared from previous platelet count. The patient denies recent history of bleeding such as epistaxis, hematuria or hematochezia. She is asymptomatic from the thrombocytopenia. I will observe for now.  she does not require further evaluation or treatment for this  

## 2020-08-09 NOTE — Progress Notes (Signed)
[place] OFFICE PROGRESS NOTE  Patient Care Team: Buzzy Han, MD as PCP - General (Family Medicine)  ASSESSMENT & PLAN:  Endometrial cancer [facility]) I recommend we continue on pembrolizumab until December before repeating another CT imaging She is in agreement to proceed We will continue the interval of pembrolizumab of every 6 weeks  Essential hypertension She is noted to have elevated blood pressure Her blood pressure monitoring at home are actually within normal limits   Thrombocytopenia ([place]) The cause could be related to her underlying medical condition. It is mild and there is little change compared from previous platelet count. The patient denies recent history of bleeding such as epistaxis, hematuria or hematochezia. She is asymptomatic from the thrombocytopenia. I will observe for now.  she does not require further evaluation or treatment for this    Orders Placed This Encounter  Procedures  . CT ABDOMEN PELVIS W CONTRAST    Standing Status:   Future    Standing Expiration Date:   08/09/2021    Order Specific Question:   If indicated for the ordered procedure, I authorize the administration of contrast media per Radiology protocol    Answer:   Yes    Order Specific Question:   Preferred imaging location?    Answer:   [facility]    Order Specific Question:   Radiology Contrast Protocol - do NOT remove file path    Answer:   [email or web address]    All questions were answered. The patient knows to call the clinic with any problems, questions or concerns. The total time spent in the appointment was 20 minutes encounter with patients including review of chart and various tests results, discussions about plan of care and coordination of care plan   [name], MD 08/09/2020 4:11 PM  INTERVAL HISTORY: Please see below for problem oriented charting. She returns with her son for further  follow-up She is doing well No side effects of treatment so far Denies recent changes in bowel habits No recent infection, fever or chills Denies infusion reaction  SUMMARY OF ONCOLOGIC HISTORY: Oncology History Overview Note  MMR - Abnormal   Endometrial cancer ([place])  06/01/2013 Initial Diagnosis   Endometrial cancer   06/01/2013 Initial Biopsy   EMB for AUB Endometrium, biopsy - POSITIVE FOR ENDOMETRIAL ADENOCARCINOMA, SEE COMMENT. Microscopic Comment As sampled the endometrial adenocarcinoma appears to be endometrioid type, FIGO Grade I.   07/21/2013 Surgery   PREOPERATIVE DIAGNOSIS: Grade 1 endometrial carcinoma. Uterine fibroids    PROCEDURE: Total abdominal hysterectomy, bilateral salpingo-oophorectomy, pelvic lymphadenectomy   SURGEON: [name], M.D  SURGICAL FINDINGS: At the time of exploratory laparotomy the uterus was approximately 16-18 weeks size with multiple subserosal fibroids. Tubes and ovaries were normal. On frozen section patient had endometrial carcinoma invading only the inner half of the myometrium. Exploration the upper abdomen revealed no evidence of metastatic disease. There was no pelvic or para-aortic lymphadenopathy.     07/21/2013 Pathologic Stage   1. Uterus +/- tubes/ovaries, neoplastic - INVASIVE ENDOMETRIOID CARCINOMA, FIGO GRADE II, WITH SQUAMOUS DIFFERENTIATION, CONFINED WITHIN INNER HALF OF THE MYOMETRIUM. - LEIOMYOMATA AND ADENOMYOSIS. - CERVIX: BENIGN SQUAMOUS MUCOSA AND ENDOCERVICAL MUCOSA, NO DYSPLASIA OR MALIGNANCY. - BILATERAL OVARIES AND FALLOPIAN TUBES: NO PATHOLOGIC ABNORMALITIES. 2. Lymph nodes, regional resection, left pelvic - FOUR LYMPH NODES, NO EVIDENCE OF METASTATIC CARCINOMA (0/4). 3. Lymph nodes, regional resection, right pelvic - THREE LYMPH NODES, NEGATIVE FOR METASTATIC CARCINOMA (0/3). Microscopic Comment 1. ONCOLOGY TABLE - UTERUS, CARCINOMA OR  CARCINOSARCOMA Specimen: Uterus, cervix, bilateral ovaries and  fallopian tubes Procedure: Total hysterectomy and bilateral salpingo-oophorectomy Lymph node sampling performed: Yes Specimen integrity: Intact Maximum tumor size (cm): 2.4 cm, gross measurement Histologic type: Invasive endometrioid carcinoma with squamous differentiation Grade: FIGO II Myometrial invasion: 1.1 cm where myometrium is 3.7 cm in thickness Cervical stromal involvement: Not identified Extent of involvement of other organs: No Lymph vascular invasion: Not identified Peritoneal washings: Negative (Please correlate with [id number]) Lymph nodes: number examined 7; number positive 0 Pelvic lymph nodes: 0 involved of 7 lymph nodes Para-aortic lymph nodes: N/A TNM code: pT1a, pN0 FIGO Stage (based on pathologic findings, needs clinical correlation): IA   09/22/2019 Imaging   CT A/P: There is a left paraspinous mass which is poorly defined and low-density measuring 3 x 2.2 by 5.5 cm. Deviates the left ureter laterally. No additional periaortic masses are identified.   10/13/2019 Pathology Results   A. LYMPH NODE, LEFT PARASPINAL, NEEDLE CORE BIOPSY: - Poorly differentiated carcinoma. COMMENT: Poorly differentiated carcinoma is present in the background of a majority of necrosis and fibrosis tissue. No distinct nodal tissue is identified. Immunohistochemistry for CK7, PAX 8 and ER is positive. CK20, TTF-1, CDX-2 and GATA-3 are negative. The immunophenotype is compatible with the provided clinical history of gynecologic cancer.   10/19/2019 Cancer Staging   Staging form: Corpus Uteri - Carcinoma and Carcinosarcoma, AJCC 8th Edition - Pathologic: Stage IIIC2 (pT1a, pN2a, cM0) - Signed by [name], MD on 10/29/2019    Genetic Testing   Patient has genetic testing done for MMR. Results revealed patient has the following mutation(s): MMR - Abnormal   10/27/2019 PET scan   1. Poorly marginated hypermetabolic nodal conglomerate in the left para-aortic and left common iliac chains,  compatible with nodal metastatic disease. 2. No additional hypermetabolic sites of metastatic disease. 3. Right upper lobe 2 mm solid pulmonary nodule, below PET resolution, recommend attention on follow-up chest CT in 3-6 months. 4. Chronic findings include: Aortic Atherosclerosis (ICD10-I70.0). Coronary atherosclerosis. Mild left colonic diverticulosis. Mild biliary and pancreatic duct dilation of uncertain etiology, unchanged since 09/22/2019 CT.   11/10/2019 -  Chemotherapy   The patient had pembrolizumab (KEYTRUDA) 200 mg in sodium chloride 0.9 % 50 mL chemo infusion, 200 mg, Intravenous, Once, 12 of 13 cycles Dose modification: 400 mg (original dose 200 mg, Cycle 10, Reason: Other (see comments), Comment: patient's preference) Administration: 200 mg (11/10/2019), 200 mg (12/22/2019), 200 mg (01/12/2020), 200 mg (02/23/2020), 200 mg (12/01/2019), 200 mg (02/02/2020), 200 mg (03/17/2020), 200 mg (04/05/2020), 200 mg (04/26/2020), 400 mg (05/17/2020), 400 mg (06/28/2020), 400 mg (08/09/2020)  for chemotherapy treatment.    02/22/2020 PET scan   1. Significant partial metabolic response. Left para-aortic and left common iliac adenopathy is decreased in size and significantly decreased in metabolism. 2. No new or progressive hypermetabolic metastatic disease. 3. Aortic Atherosclerosis (ICD10-I70.0). Additional chronic findings as detailed.   05/16/2020 Imaging   Further decrease in mild left paraaortic and left common iliac lymphadenopathy.   No new or progressive disease within the abdomen or pelvis.   Aortic Atherosclerosis (ICD10-I70.0).       REVIEW OF SYSTEMS:   Constitutional: Denies fevers, chills or abnormal weight loss Eyes: Denies blurriness of vision Ears, nose, mouth, throat, and face: Denies mucositis or sore throat Respiratory: Denies cough, dyspnea or wheezes Cardiovascular: Denies palpitation, chest discomfort or lower extremity swelling Gastrointestinal:  Denies nausea, heartburn or  change in bowel habits Skin: Denies abnormal skin rashes Lymphatics: Denies new  lymphadenopathy or easy bruising Neurological:Denies numbness, tingling or new weaknesses Behavioral/Psych: Mood is stable, no new changes  All other systems were reviewed with the patient and are negative.  I have reviewed the past medical history, past surgical history, social history and family history with the patient and they are unchanged from previous note.  ALLERGIES:  is allergic to iron, iron dextran, penicillins, amoxicillin, ampicillin, codeine, diamox [acetazolamide], penicillin g, and other.  MEDICATIONS:  Current Outpatient Medications  Medication Sig Dispense Refill  . amLODipine (NORVASC) 10 MG tablet Take by mouth.    Marland Kitchen atorvastatin (LIPITOR) 20 MG tablet Take 1 tablet (20 mg total) by mouth daily. 30 tablet 3  . brimonidine (ALPHAGAN) 0.2 % ophthalmic solution     . busPIRone (BUSPAR) 7.5 MG tablet Take 7.5 mg by mouth 2 (two) times daily.    . dorzolamide-timolol (COSOPT) 22.3-6.8 MG/ML ophthalmic solution Place 1 drop into the left eye 2 (two) times daily.    . hydrochlorothiazide (HYDRODIURIL) 25 MG tablet Take 1 tablet (25 mg total) by mouth daily. Discontinue amlodipine 90 tablet 1  . latanoprost (XALATAN) 0.005 % ophthalmic solution Apply to eye.    Marland Kitchen lisinopril (ZESTRIL) 40 MG tablet Take 1 tablet by mouth once daily 90 tablet 0  . magnesium 30 MG tablet Take by mouth daily.    . metoprolol tartrate (LOPRESSOR) 25 MG tablet Take 1 tablet (25 mg total) by mouth 2 (two) times daily. 180 tablet 1  . metoprolol tartrate (LOPRESSOR) 25 MG tablet 25 mg.    . Netarsudil Dimesylate 0.02 % SOLN Apply to eye.    . ondansetron (ZOFRAN) 8 MG tablet Take 1 tablet (8 mg total) by mouth every 8 (eight) hours as needed for nausea. 30 tablet 3  . prednisoLONE acetate (PRED FORTE) 1 % ophthalmic suspension Apply to eye.    . prochlorperazine (COMPAZINE) 10 MG tablet Take 1 tablet (10 mg total) by mouth  every 6 (six) hours as needed for nausea or vomiting. 30 tablet 0  . VYZULTA 0.024 % SOLN      No current facility-administered medications for this visit.    PHYSICAL EXAMINATION: ECOG PERFORMANCE STATUS: 1 - Symptomatic but completely ambulatory  Vitals:   08/09/20 1231  BP: (!) 143/85  Pulse: 96  Resp: 18  Temp: 98.4 F (36.9 C)  SpO2: 100%   Filed Weights   08/09/20 1231  Weight: 162 lb 6.4 oz (73.7 kg)    GENERAL:alert, no distress and comfortable SKIN: skin color, texture, turgor are normal, no rashes or significant lesions EYES: normal, Conjunctiva are pink and non-injected, sclera clear OROPHARYNX:no exudate, no erythema and lips, buccal mucosa, and tongue normal  NECK: supple, thyroid normal size, non-tender, without nodularity LYMPH:  no palpable lymphadenopathy in the cervical, axillary or inguinal LUNGS: clear to auscultation and percussion with normal breathing effort HEART: regular rate & rhythm and no murmurs and no lower extremity edema ABDOMEN:abdomen soft, non-tender and normal bowel sounds Musculoskeletal:no cyanosis of digits and no clubbing  NEURO: alert & oriented x 3 with fluent speech, no focal motor/sensory deficits  LABORATORY DATA:  I have reviewed the data as listed    Component Value Date/Time   NA 139 08/09/2020 1205   NA 139 03/03/2019 0934   NA 142 07/09/2013 1144   K 3.6 08/09/2020 1205   K 3.7 07/09/2013 1144   CL 106 08/09/2020 1205   CO2 24 08/09/2020 1205   CO2 23 07/09/2013 1144   GLUCOSE 108 (H)  08/09/2020 1205   GLUCOSE 115 07/09/2013 1144   BUN 23 08/09/2020 1205   BUN 25 03/03/2019 0934   BUN 14.4 07/09/2013 1144   CREATININE 0.93 08/09/2020 1205   CREATININE 0.82 01/02/2017 1159   CREATININE 0.8 07/09/2013 1144   CALCIUM 9.6 08/09/2020 1205   CALCIUM 9.3 07/09/2013 1144   PROT 8.0 08/09/2020 1205   PROT 8.0 03/03/2019 0934   PROT 7.8 07/09/2013 1144   ALBUMIN 4.2 08/09/2020 1205   ALBUMIN 4.7 03/03/2019 0934    ALBUMIN 3.7 07/09/2013 1144   AST 18 08/09/2020 1205   AST 9 07/09/2013 1144   ALT 15 08/09/2020 1205   ALT <6 07/09/2013 1144   ALKPHOS 112 08/09/2020 1205   ALKPHOS 58 07/09/2013 1144   BILITOT 0.8 08/09/2020 1205   BILITOT 0.40 07/09/2013 1144   GFRNONAA >60 08/09/2020 1205   GFRNONAA 74 02/01/2015 1238   GFRAA >60 06/28/2020 1031   GFRAA 85 02/01/2015 1238    No results found for: SPEP, UPEP  Lab Results  Component Value Date   WBC 5.5 08/09/2020   NEUTROABS 3.4 08/09/2020   HGB 14.4 08/09/2020   HCT 41.8 08/09/2020   MCV 89.5 08/09/2020   PLT 148 (L) 08/09/2020      Chemistry      Component Value Date/Time   NA 139 08/09/2020 1205   NA 139 03/03/2019 0934   NA 142 07/09/2013 1144   K 3.6 08/09/2020 1205   K 3.7 07/09/2013 1144   CL 106 08/09/2020 1205   CO2 24 08/09/2020 1205   CO2 23 07/09/2013 1144   BUN 23 08/09/2020 1205   BUN 25 03/03/2019 0934   BUN 14.4 07/09/2013 1144   CREATININE 0.93 08/09/2020 1205   CREATININE 0.82 01/02/2017 1159   CREATININE 0.8 07/09/2013 1144      Component Value Date/Time   CALCIUM 9.6 08/09/2020 1205   CALCIUM 9.3 07/09/2013 1144   ALKPHOS 112 08/09/2020 1205   ALKPHOS 58 07/09/2013 1144   AST 18 08/09/2020 1205   AST 9 07/09/2013 1144   ALT 15 08/09/2020 1205   ALT <6 07/09/2013 1144   BILITOT 0.8 08/09/2020 1205   BILITOT 0.40 07/09/2013 1144

## 2020-08-09 NOTE — Telephone Encounter (Signed)
Scheduled per sch msg. Gave avs and calendar  

## 2020-09-09 ENCOUNTER — Ambulatory Visit (HOSPITAL_COMMUNITY): Payer: Medicare Other

## 2020-09-19 ENCOUNTER — Other Ambulatory Visit: Payer: Self-pay

## 2020-09-19 ENCOUNTER — Inpatient Hospital Stay: Payer: Medicare Other | Attending: Gynecologic Oncology

## 2020-09-19 ENCOUNTER — Encounter (HOSPITAL_COMMUNITY): Payer: Self-pay

## 2020-09-19 ENCOUNTER — Ambulatory Visit (HOSPITAL_COMMUNITY)
Admission: RE | Admit: 2020-09-19 | Discharge: 2020-09-19 | Disposition: A | Discharge status: Home / Self Care | Payer: Medicare Other | Source: Ambulatory Visit | Attending: Hematology and Oncology | Admitting: Hematology and Oncology

## 2020-09-19 DIAGNOSIS — Z79899 Other long term (current) drug therapy: Secondary | ICD-10-CM | POA: Diagnosis not present

## 2020-09-19 DIAGNOSIS — I10 Essential (primary) hypertension: Secondary | ICD-10-CM | POA: Insufficient documentation

## 2020-09-19 DIAGNOSIS — Z5112 Encounter for antineoplastic immunotherapy: Secondary | ICD-10-CM | POA: Diagnosis present

## 2020-09-19 DIAGNOSIS — C541 Malignant neoplasm of endometrium: Secondary | ICD-10-CM | POA: Insufficient documentation

## 2020-09-19 DIAGNOSIS — Z7189 Other specified counseling: Secondary | ICD-10-CM

## 2020-09-19 LAB — CBC WITH DIFFERENTIAL (CANCER CENTER ONLY)
Abs Immature Granulocytes: 0.01 10*3/uL (ref 0.00–0.07)
Basophils Absolute: 0 10*3/uL (ref 0.0–0.1)
Basophils Relative: 1 %
Eosinophils Absolute: 0.1 10*3/uL (ref 0.0–0.5)
Eosinophils Relative: 2 %
HCT: 43.3 % (ref 36.0–46.0)
Hemoglobin: 15.4 g/dL — ABNORMAL HIGH (ref 12.0–15.0)
Immature Granulocytes: 0 %
Lymphocytes Relative: 24 %
Lymphs Abs: 1.4 10*3/uL (ref 0.7–4.0)
MCH: 31.2 pg (ref 26.0–34.0)
MCHC: 35.6 g/dL (ref 30.0–36.0)
MCV: 87.8 fL (ref 80.0–100.0)
Monocytes Absolute: 0.5 10*3/uL (ref 0.1–1.0)
Monocytes Relative: 8 %
Neutro Abs: 3.7 10*3/uL (ref 1.7–7.7)
Neutrophils Relative %: 65 %
Platelet Count: 156 10*3/uL (ref 150–400)
RBC: 4.93 MIL/uL (ref 3.87–5.11)
RDW: 12.9 % (ref 11.5–15.5)
WBC Count: 5.8 10*3/uL (ref 4.0–10.5)
nRBC: 0 % (ref 0.0–0.2)

## 2020-09-19 LAB — CMP (CANCER CENTER ONLY)
ALT: 14 U/L (ref 0–44)
AST: 19 U/L (ref 15–41)
Albumin: 4.4 g/dL (ref 3.5–5.0)
Alkaline Phosphatase: 101 U/L (ref 38–126)
Anion gap: 10 (ref 5–15)
BUN: 19 mg/dL (ref 8–23)
CO2: 23 mmol/L (ref 22–32)
Calcium: 10.1 mg/dL (ref 8.9–10.3)
Chloride: 108 mmol/L (ref 98–111)
Creatinine: 0.96 mg/dL (ref 0.44–1.00)
GFR, Estimated: >60 mL/min (ref >60)
Glucose, Bld: 103 mg/dL — ABNORMAL HIGH (ref 70–99)
Potassium: 4 mmol/L (ref 3.5–5.1)
Sodium: 141 mmol/L (ref 135–145)
Total Bilirubin: 0.8 mg/dL (ref 0.3–1.2)
Total Protein: 8.7 g/dL — ABNORMAL HIGH (ref 6.5–8.1)

## 2020-09-19 LAB — TSH: TSH: 1.586 u[IU]/mL (ref 0.308–3.960)

## 2020-09-19 MED ORDER — IOHEXOL 300 MG/ML  SOLN
100.0000 mL | Freq: Once | INTRAMUSCULAR | Status: AC | PRN
  Administered 2020-09-19: 100 mL via INTRAVENOUS

## 2020-09-20 ENCOUNTER — Inpatient Hospital Stay (HOSPITAL_BASED_OUTPATIENT_CLINIC_OR_DEPARTMENT_OTHER): Payer: Medicare Other | Admitting: Hematology and Oncology

## 2020-09-20 ENCOUNTER — Inpatient Hospital Stay: Payer: Medicare Other

## 2020-09-20 ENCOUNTER — Other Ambulatory Visit: Payer: Self-pay

## 2020-09-20 ENCOUNTER — Other Ambulatory Visit: Payer: Medicare Other

## 2020-09-20 DIAGNOSIS — C541 Malignant neoplasm of endometrium: Secondary | ICD-10-CM

## 2020-09-20 DIAGNOSIS — Z5112 Encounter for antineoplastic immunotherapy: Secondary | ICD-10-CM | POA: Diagnosis not present

## 2020-09-20 DIAGNOSIS — I10 Essential (primary) hypertension: Secondary | ICD-10-CM

## 2020-09-20 DIAGNOSIS — Z7189 Other specified counseling: Secondary | ICD-10-CM | POA: Diagnosis not present

## 2020-09-20 MED ORDER — SODIUM CHLORIDE 0.9 % IV SOLN
Freq: Once | INTRAVENOUS | Status: AC
  Filled 2020-09-20: qty 250

## 2020-09-20 MED ORDER — SODIUM CHLORIDE 0.9 % IV SOLN
400.0000 mg | Freq: Once | INTRAVENOUS | Status: AC
  Administered 2020-09-20: 400 mg via INTRAVENOUS
  Filled 2020-09-20: qty 16

## 2020-09-20 NOTE — Progress Notes (Signed)
Pt discharged in no apparent distress. Pt left ambulatory without assistance. Pt aware of discharge instructions and verbalized understanding and had no further questions.  

## 2020-09-20 NOTE — Patient Instructions (Signed)
[facility] Discharge Instructions for Patients Receiving Chemotherapy  Today you received the following chemotherapy agents: pembrolizumab.  To help prevent nausea and vomiting after your treatment, we encourage you to take your nausea medication as directed.   If you develop nausea and vomiting that is not controlled by your nausea medication, call the clinic.   BELOW ARE SYMPTOMS THAT SHOULD BE REPORTED IMMEDIATELY:  *FEVER GREATER THAN 100.5 F  *CHILLS WITH OR WITHOUT FEVER  NAUSEA AND VOMITING THAT IS NOT CONTROLLED WITH YOUR NAUSEA MEDICATION  *UNUSUAL SHORTNESS OF BREATH  *UNUSUAL BRUISING OR BLEEDING  TENDERNESS IN MOUTH AND THROAT WITH OR WITHOUT PRESENCE OF ULCERS  *URINARY PROBLEMS  *BOWEL PROBLEMS  UNUSUAL RASH Items with * indicate a potential emergency and should be followed up as soon as possible.  Feel free to call the clinic should you have any questions or concerns. The clinic phone number is [phone].  Please show the CHEMO ALERT CARD at check-in to the Emergency Department and triage nurse.   

## 2020-09-21 ENCOUNTER — Encounter: Payer: Self-pay | Admitting: Hematology and Oncology

## 2020-09-21 NOTE — Assessment & Plan Note (Signed)
Her imaging studies show excellent response to therapy We discussed the risk and benefits of continuing treatment She is in agreement to proceed We will continue the interval of pembrolizumab of every 6 weeks I plan to delay the next imaging study until April 2022

## 2020-09-21 NOTE — Progress Notes (Signed)
[place] OFFICE PROGRESS NOTE  Patient Care Team: Buzzy Han, MD as PCP - General (Family Medicine)  ASSESSMENT & PLAN:  Endometrial cancer [facility]) Her imaging studies show excellent response to therapy We discussed the risk and benefits of continuing treatment She is in agreement to proceed We will continue the interval of pembrolizumab of every 6 weeks I plan to delay the next imaging study until April 2022  Essential hypertension She is noted to have elevated blood pressure Her blood pressure monitoring at home are actually within normal limits   Goals of care, counseling/discussion We discussed the goals of care and the role of maintenance treatment and she is in agreement to proceed   No orders of the defined types were placed in this encounter.   All questions were answered. The patient knows to call the clinic with any problems, questions or concerns. The total time spent in the appointment was 20 minutes encounter with patients including review of chart and various tests results, discussions about plan of care and coordination of care plan   [name], MD 09/21/2020 9:36 AM  INTERVAL HISTORY: Please see below for problem oriented charting. She returns with her son for further follow-up She tolerated recent treatment well Denies abdominal pain or changes in bowel habits No recent infusion reactions  SUMMARY OF ONCOLOGIC HISTORY: Oncology History Overview Note  MMR - Abnormal   Endometrial cancer ([place])  06/01/2013 Initial Diagnosis   Endometrial cancer   06/01/2013 Initial Biopsy   EMB for AUB Endometrium, biopsy - POSITIVE FOR ENDOMETRIAL ADENOCARCINOMA, SEE COMMENT. Microscopic Comment As sampled the endometrial adenocarcinoma appears to be endometrioid type, FIGO Grade I.   07/21/2013 Surgery   PREOPERATIVE DIAGNOSIS: Grade 1 endometrial carcinoma. Uterine fibroids    PROCEDURE: Total abdominal hysterectomy, bilateral  salpingo-oophorectomy, pelvic lymphadenectomy   SURGEON: [name], M.D  SURGICAL FINDINGS: At the time of exploratory laparotomy the uterus was approximately 16-18 weeks size with multiple subserosal fibroids. Tubes and ovaries were normal. On frozen section patient had endometrial carcinoma invading only the inner half of the myometrium. Exploration the upper abdomen revealed no evidence of metastatic disease. There was no pelvic or para-aortic lymphadenopathy.     07/21/2013 Pathologic Stage   1. Uterus +/- tubes/ovaries, neoplastic - INVASIVE ENDOMETRIOID CARCINOMA, FIGO GRADE II, WITH SQUAMOUS DIFFERENTIATION, CONFINED WITHIN INNER HALF OF THE MYOMETRIUM. - LEIOMYOMATA AND ADENOMYOSIS. - CERVIX: BENIGN SQUAMOUS MUCOSA AND ENDOCERVICAL MUCOSA, NO DYSPLASIA OR MALIGNANCY. - BILATERAL OVARIES AND FALLOPIAN TUBES: NO PATHOLOGIC ABNORMALITIES. 2. Lymph nodes, regional resection, left pelvic - FOUR LYMPH NODES, NO EVIDENCE OF METASTATIC CARCINOMA (0/4). 3. Lymph nodes, regional resection, right pelvic - THREE LYMPH NODES, NEGATIVE FOR METASTATIC CARCINOMA (0/3). Microscopic Comment 1. ONCOLOGY TABLE - UTERUS, CARCINOMA OR CARCINOSARCOMA Specimen: Uterus, cervix, bilateral ovaries and fallopian tubes Procedure: Total hysterectomy and bilateral salpingo-oophorectomy Lymph node sampling performed: Yes Specimen integrity: Intact Maximum tumor size (cm): 2.4 cm, gross measurement Histologic type: Invasive endometrioid carcinoma with squamous differentiation Grade: FIGO II Myometrial invasion: 1.1 cm where myometrium is 3.7 cm in thickness Cervical stromal involvement: Not identified Extent of involvement of other organs: No Lymph vascular invasion: Not identified Peritoneal washings: Negative (Please correlate with [id number]) Lymph nodes: number examined 7; number positive 0 Pelvic lymph nodes: 0 involved of 7 lymph nodes Para-aortic lymph nodes: N/A TNM code: pT1a, pN0 FIGO  Stage (based on pathologic findings, needs clinical correlation): IA   09/22/2019 Imaging   CT A/P: There is a left paraspinous mass  which is poorly defined and low-density measuring 3 x 2.2 by 5.5 cm. Deviates the left ureter laterally. No additional periaortic masses are identified.   10/13/2019 Pathology Results   A. LYMPH NODE, LEFT PARASPINAL, NEEDLE CORE BIOPSY: - Poorly differentiated carcinoma. COMMENT: Poorly differentiated carcinoma is present in the background of a majority of necrosis and fibrosis tissue. No distinct nodal tissue is identified. Immunohistochemistry for CK7, PAX 8 and ER is positive. CK20, TTF-1, CDX-2 and GATA-3 are negative. The immunophenotype is compatible with the provided clinical history of gynecologic cancer.   10/19/2019 Cancer Staging   Staging form: Corpus Uteri - Carcinoma and Carcinosarcoma, AJCC 8th Edition - Pathologic: Stage IIIC2 (pT1a, pN2a, cM0) - Signed by [name], MD on 10/29/2019    Genetic Testing   Patient has genetic testing done for MMR. Results revealed patient has the following mutation(s): MMR - Abnormal   10/27/2019 PET scan   1. Poorly marginated hypermetabolic nodal conglomerate in the left para-aortic and left common iliac chains, compatible with nodal metastatic disease. 2. No additional hypermetabolic sites of metastatic disease. 3. Right upper lobe 2 mm solid pulmonary nodule, below PET resolution, recommend attention on follow-up chest CT in 3-6 months. 4. Chronic findings include: Aortic Atherosclerosis (ICD10-I70.0). Coronary atherosclerosis. Mild left colonic diverticulosis. Mild biliary and pancreatic duct dilation of uncertain etiology, unchanged since 09/22/2019 CT.   11/10/2019 -  Chemotherapy   The patient had pembrolizumab (KEYTRUDA) 200 mg in sodium chloride 0.9 % 50 mL chemo infusion, 200 mg, Intravenous, Once, 13 of 14 cycles Dose modification: 400 mg (original dose 200 mg, Cycle 10, Reason: Other (see comments),  Comment: patient's preference) Administration: 200 mg (11/10/2019), 200 mg (12/22/2019), 200 mg (01/12/2020), 200 mg (02/23/2020), 200 mg (12/01/2019), 200 mg (02/02/2020), 200 mg (03/17/2020), 200 mg (04/05/2020), 200 mg (04/26/2020), 400 mg (05/17/2020), 400 mg (06/28/2020), 400 mg (08/09/2020), 400 mg (09/20/2020)  for chemotherapy treatment.    02/22/2020 PET scan   1. Significant partial metabolic response. Left para-aortic and left common iliac adenopathy is decreased in size and significantly decreased in metabolism. 2. No new or progressive hypermetabolic metastatic disease. 3. Aortic Atherosclerosis (ICD10-I70.0). Additional chronic findings as detailed.   05/16/2020 Imaging   Further decrease in mild left paraaortic and left common iliac lymphadenopathy.   No new or progressive disease within the abdomen or pelvis.   Aortic Atherosclerosis (ICD10-I70.0).     09/19/2020 Imaging   1. Mild response to therapy as evidenced by decrease in abdominopelvic adenopathy. 2. No new sites of disease. 3. Hysterectomy. 4.  Aortic Atherosclerosis (ICD10-I70.0).       REVIEW OF SYSTEMS:   Constitutional: Denies fevers, chills or abnormal weight loss Eyes: Denies blurriness of vision Ears, nose, mouth, throat, and face: Denies mucositis or sore throat Respiratory: Denies cough, dyspnea or wheezes Cardiovascular: Denies palpitation, chest discomfort or lower extremity swelling Gastrointestinal:  Denies nausea, heartburn or change in bowel habits Skin: Denies abnormal skin rashes Lymphatics: Denies new lymphadenopathy or easy bruising Neurological:Denies numbness, tingling or new weaknesses Behavioral/Psych: Mood is stable, no new changes  All other systems were reviewed with the patient and are negative.  I have reviewed the past medical history, past surgical history, social history and family history with the patient and they are unchanged from previous note.  ALLERGIES:  is allergic to iron, iron  dextran, penicillins, amoxicillin, ampicillin, codeine, diamox [acetazolamide], penicillin g, and other.  MEDICATIONS:  Current Outpatient Medications  Medication Sig Dispense Refill  . amLODipine (NORVASC) 10 MG  tablet Take by mouth.    Marland Kitchen atorvastatin (LIPITOR) 20 MG tablet Take 1 tablet (20 mg total) by mouth daily. 30 tablet 3  . brimonidine (ALPHAGAN) 0.2 % ophthalmic solution     . busPIRone (BUSPAR) 7.5 MG tablet Take 7.5 mg by mouth 2 (two) times daily.    . dorzolamide-timolol (COSOPT) 22.3-6.8 MG/ML ophthalmic solution Place 1 drop into the left eye 2 (two) times daily.    . hydrochlorothiazide (HYDRODIURIL) 25 MG tablet Take 1 tablet (25 mg total) by mouth daily. Discontinue amlodipine 90 tablet 1  . latanoprost (XALATAN) 0.005 % ophthalmic solution Apply to eye.    Marland Kitchen lisinopril (ZESTRIL) 40 MG tablet Take 1 tablet by mouth once daily 90 tablet 0  . magnesium 30 MG tablet Take by mouth daily.    . metoprolol tartrate (LOPRESSOR) 25 MG tablet Take 1 tablet (25 mg total) by mouth 2 (two) times daily. 180 tablet 1  . metoprolol tartrate (LOPRESSOR) 25 MG tablet 25 mg.    . Netarsudil Dimesylate 0.02 % SOLN Apply to eye.    . ondansetron (ZOFRAN) 8 MG tablet Take 1 tablet (8 mg total) by mouth every 8 (eight) hours as needed for nausea. 30 tablet 3  . prednisoLONE acetate (PRED FORTE) 1 % ophthalmic suspension Apply to eye.    . prochlorperazine (COMPAZINE) 10 MG tablet Take 1 tablet (10 mg total) by mouth every 6 (six) hours as needed for nausea or vomiting. 30 tablet 0  . VYZULTA 0.024 % SOLN      No current facility-administered medications for this visit.    PHYSICAL EXAMINATION: ECOG PERFORMANCE STATUS: 1 - Symptomatic but completely ambulatory  Vitals:   09/20/20 1151  BP: (!) 168/87  Pulse: 100  Resp: 18  Temp: (!) 97.5 F (36.4 C)  SpO2: 100%   Filed Weights   09/20/20 1151  Weight: 158 lb 12.8 oz (72 kg)    GENERAL:alert, no distress and comfortable NEURO:  alert & oriented x 3 with fluent speech, no focal motor/sensory deficits  LABORATORY DATA:  I have reviewed the data as listed    Component Value Date/Time   NA 141 09/19/2020 0937   NA 139 03/03/2019 0934   NA 142 07/09/2013 1144   K 4.0 09/19/2020 0937   K 3.7 07/09/2013 1144   CL 108 09/19/2020 0937   CO2 23 09/19/2020 0937   CO2 23 07/09/2013 1144   GLUCOSE 103 (H) 09/19/2020 0937   GLUCOSE 115 07/09/2013 1144   BUN 19 09/19/2020 0937   BUN 25 03/03/2019 0934   BUN 14.4 07/09/2013 1144   CREATININE 0.96 09/19/2020 0937   CREATININE 0.82 01/02/2017 1159   CREATININE 0.8 07/09/2013 1144   CALCIUM 10.1 09/19/2020 0937   CALCIUM 9.3 07/09/2013 1144   PROT 8.7 (H) 09/19/2020 0937   PROT 8.0 03/03/2019 0934   PROT 7.8 07/09/2013 1144   ALBUMIN 4.4 09/19/2020 0937   ALBUMIN 4.7 03/03/2019 0934   ALBUMIN 3.7 07/09/2013 1144   AST 19 09/19/2020 0937   AST 9 07/09/2013 1144   ALT 14 09/19/2020 0937   ALT <6 07/09/2013 1144   ALKPHOS 101 09/19/2020 0937   ALKPHOS 58 07/09/2013 1144   BILITOT 0.8 09/19/2020 0937   BILITOT 0.40 07/09/2013 1144   GFRNONAA >60 09/19/2020 0937   GFRNONAA 74 02/01/2015 1238   GFRAA >60 06/28/2020 1031   GFRAA 85 02/01/2015 1238    No results found for: SPEP, UPEP  Lab Results  Component Value  Date   WBC 5.8 09/19/2020   NEUTROABS 3.7 09/19/2020   HGB 15.4 (H) 09/19/2020   HCT 43.3 09/19/2020   MCV 87.8 09/19/2020   PLT 156 09/19/2020      Chemistry      Component Value Date/Time   NA 141 09/19/2020 0937   NA 139 03/03/2019 0934   NA 142 07/09/2013 1144   K 4.0 09/19/2020 0937   K 3.7 07/09/2013 1144   CL 108 09/19/2020 0937   CO2 23 09/19/2020 0937   CO2 23 07/09/2013 1144   BUN 19 09/19/2020 0937   BUN 25 03/03/2019 0934   BUN 14.4 07/09/2013 1144   CREATININE 0.96 09/19/2020 0937   CREATININE 0.82 01/02/2017 1159   CREATININE 0.8 07/09/2013 1144      Component Value Date/Time   CALCIUM 10.1 09/19/2020 0937   CALCIUM  9.3 07/09/2013 1144   ALKPHOS 101 09/19/2020 0937   ALKPHOS 58 07/09/2013 1144   AST 19 09/19/2020 0937   AST 9 07/09/2013 1144   ALT 14 09/19/2020 0937   ALT <6 07/09/2013 1144   BILITOT 0.8 09/19/2020 0937   BILITOT 0.40 07/09/2013 1144       RADIOGRAPHIC STUDIES: I have personally reviewed the radiological images as listed and agreed with the findings in the report. CT ABDOMEN PELVIS W CONTRAST  Result Date: 09/19/2020 CLINICAL DATA:  Evaluate treatment response for endometrial cancer diagnosed 2014. Hysterectomy. Recurrent disease last year. Radiation therapy finished. Ongoing immunotherapy. EXAM: CT ABDOMEN AND PELVIS WITH CONTRAST TECHNIQUE: Multidetector CT imaging of the abdomen and pelvis was performed using the standard protocol following bolus administration of intravenous contrast. CONTRAST:  18m OMNIPAQUE IOHEXOL 300 MG/ML  SOLN COMPARISON:  05/16/2020 FINDINGS: Lower chest: Clear lung bases. Normal heart size without pericardial or pleural effusion. Hepatobiliary: Scattered well-circumscribed low-density liver lesions are likely cysts and are not significantly changed. Normal gallbladder with upper normal common duct size, 9 mm. Similar. Pancreas: Normal, without mass or ductal dilatation. Spleen: Normal in size, without focal abnormality. Adrenals/Urinary Tract: Normal adrenal glands. Normal right kidney. Left renal too small to characterize lesions. Normal urinary bladder. Stomach/Bowel: Normal stomach, without wall thickening. Colonic stool burden suggests constipation. Normal terminal ileum. Normal small bowel. Vascular/Lymphatic: Aortic atherosclerosis. Left periaortic adenopathy at 1.3 cm on 42/2. Compare 1.6 cm on the prior 1.0 cm right external iliac node on 70/2 is decreased from 1.1 cm on the prior exam. Left common iliac node measures 9 mm on 44/2 and is similar to the prior exam (when remeasured). Reproductive: Hysterectomy.  No adnexal mass. Other: No significant free  fluid. No evidence of omental or peritoneal disease. Musculoskeletal: Probable bone island in the left iliac, similar. Mild disc bulge at the lumbosacral junction. IMPRESSION: 1. Mild response to therapy as evidenced by decrease in abdominopelvic adenopathy. 2. No new sites of disease. 3. Hysterectomy. 4.  Aortic Atherosclerosis (ICD10-I70.0). Electronically Signed   By: KAbigail MiyamotoM.D.   On: 09/19/2020 16:41

## 2020-09-21 NOTE — Assessment & Plan Note (Signed)
We discussed the goals of care and the role of maintenance treatment and she is in agreement to proceed

## 2020-09-21 NOTE — Assessment & Plan Note (Signed)
She is noted to have elevated blood pressure Her blood pressure monitoring at home are actually within normal limits

## 2020-10-31 ENCOUNTER — Telehealth: Payer: Self-pay | Admitting: Oncology

## 2020-10-31 NOTE — Telephone Encounter (Signed)
Diamond[facility] called and asked if she can reschedule her appointments to next Tuesday, 11/08/20.  Advised her a scheduling message will be sent and that a scheduler will call her with the new appointments.

## 2020-11-01 ENCOUNTER — Inpatient Hospital Stay: Payer: Medicare Other

## 2020-11-01 ENCOUNTER — Inpatient Hospital Stay: Payer: Medicare Other | Admitting: Hematology and Oncology

## 2020-11-04 ENCOUNTER — Telehealth: Payer: Self-pay | Admitting: Oncology

## 2020-11-04 NOTE — Telephone Encounter (Signed)
Called Stephanie [name] and notified her of appointments for 11/09/20.  She verbalized understanding and agreement.

## 2020-11-09 ENCOUNTER — Other Ambulatory Visit: Payer: Self-pay

## 2020-11-09 ENCOUNTER — Inpatient Hospital Stay: Payer: Medicare Other | Attending: Gynecologic Oncology | Admitting: Hematology and Oncology

## 2020-11-09 ENCOUNTER — Inpatient Hospital Stay: Payer: Medicare Other

## 2020-11-09 ENCOUNTER — Telehealth: Payer: Self-pay | Admitting: Hematology and Oncology

## 2020-11-09 VITALS — BP 142/82 | HR 74

## 2020-11-09 DIAGNOSIS — Z79899 Other long term (current) drug therapy: Secondary | ICD-10-CM | POA: Diagnosis not present

## 2020-11-09 DIAGNOSIS — Z5112 Encounter for antineoplastic immunotherapy: Secondary | ICD-10-CM | POA: Insufficient documentation

## 2020-11-09 DIAGNOSIS — I10 Essential (primary) hypertension: Secondary | ICD-10-CM

## 2020-11-09 DIAGNOSIS — Z7189 Other specified counseling: Secondary | ICD-10-CM

## 2020-11-09 DIAGNOSIS — C541 Malignant neoplasm of endometrium: Secondary | ICD-10-CM

## 2020-11-09 DIAGNOSIS — E876 Hypokalemia: Secondary | ICD-10-CM | POA: Diagnosis not present

## 2020-11-09 LAB — CMP (CANCER CENTER ONLY)
ALT: 11 U/L (ref 0–44)
AST: 16 U/L (ref 15–41)
Albumin: 4.1 g/dL (ref 3.5–5.0)
Alkaline Phosphatase: 86 U/L (ref 38–126)
Anion gap: 8 (ref 5–15)
BUN: 10 mg/dL (ref 8–23)
CO2: 25 mmol/L (ref 22–32)
Calcium: 9.4 mg/dL (ref 8.9–10.3)
Chloride: 107 mmol/L (ref 98–111)
Creatinine: 0.91 mg/dL (ref 0.44–1.00)
GFR, Estimated: >60 mL/min (ref >60)
Glucose, Bld: 176 mg/dL — ABNORMAL HIGH (ref 70–99)
Potassium: 3.3 mmol/L — ABNORMAL LOW (ref 3.5–5.1)
Sodium: 140 mmol/L (ref 135–145)
Total Bilirubin: 1.1 mg/dL (ref 0.3–1.2)
Total Protein: 7.9 g/dL (ref 6.5–8.1)

## 2020-11-09 LAB — CBC WITH DIFFERENTIAL (CANCER CENTER ONLY)
Abs Immature Granulocytes: 0.01 10*3/uL (ref 0.00–0.07)
Basophils Absolute: 0 10*3/uL (ref 0.0–0.1)
Basophils Relative: 1 %
Eosinophils Absolute: 0.1 10*3/uL (ref 0.0–0.5)
Eosinophils Relative: 2 %
HCT: 39.2 % (ref 36.0–46.0)
Hemoglobin: 14.2 g/dL (ref 12.0–15.0)
Immature Granulocytes: 0 %
Lymphocytes Relative: 24 %
Lymphs Abs: 1.3 10*3/uL (ref 0.7–4.0)
MCH: 31.2 pg (ref 26.0–34.0)
MCHC: 36.2 g/dL — ABNORMAL HIGH (ref 30.0–36.0)
MCV: 86.2 fL (ref 80.0–100.0)
Monocytes Absolute: 0.5 10*3/uL (ref 0.1–1.0)
Monocytes Relative: 9 %
Neutro Abs: 3.6 10*3/uL (ref 1.7–7.7)
Neutrophils Relative %: 64 %
Platelet Count: 158 10*3/uL (ref 150–400)
RBC: 4.55 MIL/uL (ref 3.87–5.11)
RDW: 13 % (ref 11.5–15.5)
WBC Count: 5.6 10*3/uL (ref 4.0–10.5)
nRBC: 0 % (ref 0.0–0.2)

## 2020-11-09 LAB — TSH: TSH: 0.428 u[IU]/mL (ref 0.308–3.960)

## 2020-11-09 MED ORDER — SODIUM CHLORIDE 0.9 % IV SOLN
Freq: Once | INTRAVENOUS | Status: AC
  Filled 2020-11-09: qty 250

## 2020-11-09 MED ORDER — SODIUM CHLORIDE 0.9 % IV SOLN
400.0000 mg | Freq: Once | INTRAVENOUS | Status: AC
  Administered 2020-11-09: 400 mg via INTRAVENOUS
  Filled 2020-11-09: qty 16

## 2020-11-09 NOTE — Progress Notes (Signed)
Pt discharged in no apparent distress. Pt left ambulatory without assistance. Pt aware of discharge instructions and verbalized understanding and had no further questions.  

## 2020-11-09 NOTE — Telephone Encounter (Signed)
Scheduled appointments per 1/26 sch msg. Spoke to patient who is aware of appointments date and times.

## 2020-11-09 NOTE — Patient Instructions (Signed)
[facility] Discharge Instructions for Patients Receiving Chemotherapy  Today you received the following chemotherapy agents: pembrolizumab.  To help prevent nausea and vomiting after your treatment, we encourage you to take your nausea medication as directed.   If you develop nausea and vomiting that is not controlled by your nausea medication, call the clinic.   BELOW ARE SYMPTOMS THAT SHOULD BE REPORTED IMMEDIATELY:  *FEVER GREATER THAN 100.5 F  *CHILLS WITH OR WITHOUT FEVER  NAUSEA AND VOMITING THAT IS NOT CONTROLLED WITH YOUR NAUSEA MEDICATION  *UNUSUAL SHORTNESS OF BREATH  *UNUSUAL BRUISING OR BLEEDING  TENDERNESS IN MOUTH AND THROAT WITH OR WITHOUT PRESENCE OF ULCERS  *URINARY PROBLEMS  *BOWEL PROBLEMS  UNUSUAL RASH Items with * indicate a potential emergency and should be followed up as soon as possible.  Feel free to call the clinic should you have any questions or concerns. The clinic phone number is [phone].  Please show the CHEMO ALERT CARD at check-in to the Emergency Department and triage nurse.   

## 2020-11-10 ENCOUNTER — Encounter: Payer: Self-pay | Admitting: Hematology and Oncology

## 2020-11-10 DIAGNOSIS — E876 Hypokalemia: Secondary | ICD-10-CM

## 2020-11-10 HISTORY — DX: Hypokalemia: E87.6

## 2020-11-10 NOTE — Assessment & Plan Note (Signed)
She has mild hypokalemia but not symptomatic Observe for now

## 2020-11-10 NOTE — Assessment & Plan Note (Signed)
Her imaging studies show excellent response to therapy We discussed the risk and benefits of continuing treatment She is in agreement to proceed We will continue the interval of pembrolizumab of every 6 weeks I plan to delay the next imaging study until April 2022 

## 2020-11-10 NOTE — Progress Notes (Signed)
[facility] OFF[facility] PROGRESS NOTE  Patient Care Team: Buzzy Han, MD as PCP - General (Family Medicine)  ASSESSMENT & PLAN:  Endometrial cancer [facility]) Her imaging studies show excellent response to therapy We discussed the risk and benefits of continuing treatment She is in agreement to proceed We will continue the interval of pembrolizumab of every 6 weeks I plan to delay the next imaging study until April 2022  Hypokalemia She has mild hypokalemia but not symptomatic Observe for now  Essential hypertension She has intermittent hypertension could be related to whitecoat hypertension She is not symptomatic She will continue her prescribed blood pressure medications   No orders of the defined types were placed in this encounter.   All questions were answered. The patient knows to call the clinic with any problems, questions or concerns. The total time spent in the appointment was 20 minutes encounter with patients including review of chart and various tests results, discussions about plan of care and coordination of care plan   [name], MD 11/10/2020 9:04 AM  INTERVAL HISTORY: Please see below for problem oriented charting. She returns with her son for further follow-up She is doing well No side effects from treatment Appetite is stable Denies abdominal pain no changes in bowel habits  SUMMARY OF ONCOLOGIC HISTORY: Oncology History Overview Note  MMR - Abnormal   Endometrial cancer ([place])  06/01/2013 Initial Diagnosis   Endometrial cancer   06/01/2013 Initial Biopsy   EMB for AUB Endometrium, biopsy - POSITIVE FOR ENDOMETRIAL ADENOCARCINOMA, SEE COMMENT. Microscopic Comment As sampled the endometrial adenocarcinoma appears to be endometrioid type, FIGO Grade I.   07/21/2013 Surgery   PREOPERATIVE DIAGNOSIS: Grade 1 endometrial carcinoma. Uterine fibroids    PROCEDURE: Total abdominal hysterectomy, bilateral salpingo-oophorectomy, pelvic  lymphadenectomy   SURGEON: [name], M.D  SURGICAL FINDINGS: At the time of exploratory laparotomy the uterus was approximately 16-18 weeks size with multiple subserosal fibroids. Tubes and ovaries were normal. On frozen section patient had endometrial carcinoma invading only the inner half of the myometrium. Exploration the upper abdomen revealed no evidence of metastatic disease. There was no pelvic or para-aortic lymphadenopathy.     07/21/2013 Pathologic Stage   1. Uterus +/- tubes/ovaries, neoplastic - INVASIVE ENDOMETRIOID CARCINOMA, FIGO GRADE II, WITH SQUAMOUS DIFFERENTIATION, CONFINED WITHIN INNER HALF OF THE MYOMETRIUM. - LEIOMYOMATA AND ADENOMYOSIS. - CERVIX: BENIGN SQUAMOUS MUCOSA AND ENDOCERVICAL MUCOSA, NO DYSPLASIA OR MALIGNANCY. - BILATERAL OVARIES AND FALLOPIAN TUBES: NO PATHOLOGIC ABNORMALITIES. 2. Lymph nodes, regional resection, left pelvic - FOUR LYMPH NODES, NO EVIDENCE OF METASTATIC CARCINOMA (0/4). 3. Lymph nodes, regional resection, right pelvic - THREE LYMPH NODES, NEGATIVE FOR METASTATIC CARCINOMA (0/3). Microscopic Comment 1. ONCOLOGY TABLE - UTERUS, CARCINOMA OR CARCINOSARCOMA Specimen: Uterus, cervix, bilateral ovaries and fallopian tubes Procedure: Total hysterectomy and bilateral salpingo-oophorectomy Lymph node sampling performed: Yes Specimen integrity: Intact Maximum tumor size (cm): 2.4 cm, gross measurement Histologic type: Invasive endometrioid carcinoma with squamous differentiation Grade: FIGO II Myometrial invasion: 1.1 cm where myometrium is 3.7 cm in thickness Cervical stromal involvement: Not identified Extent of involvement of other organs: No Lymph vascular invasion: Not identified Peritoneal washings: Negative (Please correlate with [id number]) Lymph nodes: number examined 7; number positive 0 Pelvic lymph nodes: 0 involved of 7 lymph nodes Para-aortic lymph nodes: N/A TNM code: pT1a, pN0 FIGO Stage (based on pathologic  findings, needs clinical correlation): IA   09/22/2019 Imaging   CT A/P: There is a left paraspinous mass which is poorly defined and low-density measuring  3 x 2.2 by 5.5 cm. Deviates the left ureter laterally. No additional periaortic masses are identified.   10/13/2019 Pathology Results   A. LYMPH NODE, LEFT PARASPINAL, NEEDLE CORE BIOPSY: - Poorly differentiated carcinoma. COMMENT: Poorly differentiated carcinoma is present in the background of a majority of necrosis and fibrosis tissue. No distinct nodal tissue is identified. Immunohistochemistry for CK7, PAX 8 and ER is positive. CK20, TTF-1, CDX-2 and GATA-3 are negative. The immunophenotype is compatible with the provided clinical history of gynecologic cancer.   10/19/2019 Cancer Staging   Staging form: Corpus Uteri - Carcinoma and Carcinosarcoma, AJCC 8th Edition - Pathologic: Stage IIIC2 (pT1a, pN2a, cM0) - Signed by [name], MD on 10/29/2019    Genetic Testing   Patient has genetic testing done for MMR. Results revealed patient has the following mutation(s): MMR - Abnormal   10/27/2019 PET scan   1. Poorly marginated hypermetabolic nodal conglomerate in the left para-aortic and left common iliac chains, compatible with nodal metastatic disease. 2. No additional hypermetabolic sites of metastatic disease. 3. Right upper lobe 2 mm solid pulmonary nodule, below PET resolution, recommend attention on follow-up chest CT in 3-6 months. 4. Chronic findings include: Aortic Atherosclerosis (ICD10-I70.0). Coronary atherosclerosis. Mild left colonic diverticulosis. Mild biliary and pancreatic duct dilation of uncertain etiology, unchanged since 09/22/2019 CT.   11/10/2019 -  Chemotherapy    Patient is on Treatment Plan: UTERINE PEMBROLIZUMAB Q42D      02/22/2020 PET scan   1. Significant partial metabolic response. Left para-aortic and left common iliac adenopathy is decreased in size and significantly decreased in metabolism. 2. No  new or progressive hypermetabolic metastatic disease. 3. Aortic Atherosclerosis (ICD10-I70.0). Additional chronic findings as detailed.   05/16/2020 Imaging   Further decrease in mild left paraaortic and left common iliac lymphadenopathy.   No new or progressive disease within the abdomen or pelvis.   Aortic Atherosclerosis (ICD10-I70.0).     09/19/2020 Imaging   1. Mild response to therapy as evidenced by decrease in abdominopelvic adenopathy. 2. No new sites of disease. 3. Hysterectomy. 4.  Aortic Atherosclerosis (ICD10-I70.0).       REVIEW OF SYSTEMS:   Constitutional: Denies fevers, chills or abnormal weight loss Eyes: Denies blurriness of vision Ears, nose, mouth, throat, and face: Denies mucositis or sore throat Respiratory: Denies cough, dyspnea or wheezes Cardiovascular: Denies palpitation, chest discomfort or lower extremity swelling Gastrointestinal:  Denies nausea, heartburn or change in bowel habits Skin: Denies abnormal skin rashes Lymphatics: Denies new lymphadenopathy or easy bruising Neurological:Denies numbness, tingling or new weaknesses Behavioral/Psych: Mood is stable, no new changes  All other systems were reviewed with the patient and are negative.  I have reviewed the past medical history, past surgical history, social history and family history with the patient and they are unchanged from previous note.  ALLERGIES:  is allergic to iron, iron dextran, penicillins, amoxicillin, ampicillin, codeine, diamox [acetazolamide], penicillin g, and other.  MEDICATIONS:  Current Outpatient Medications  Medication Sig Dispense Refill  . amLODipine (NORVASC) 10 MG tablet Take by mouth.    Marland Kitchen atorvastatin (LIPITOR) 20 MG tablet Take 1 tablet (20 mg total) by mouth daily. 30 tablet 3  . brimonidine (ALPHAGAN) 0.2 % ophthalmic solution     . busPIRone (BUSPAR) 7.5 MG tablet Take 7.5 mg by mouth 2 (two) times daily.    . dorzolamide-timolol (COSOPT) 22.3-6.8 MG/ML  ophthalmic solution Place 1 drop into the left eye 2 (two) times daily.    . hydrochlorothiazide (HYDRODIURIL) 25 MG  tablet Take 1 tablet (25 mg total) by mouth daily. Discontinue amlodipine 90 tablet 1  . latanoprost (XALATAN) 0.005 % ophthalmic solution Apply to eye.    Marland Kitchen lisinopril (ZESTRIL) 40 MG tablet Take 1 tablet by mouth once daily 90 tablet 0  . magnesium 30 MG tablet Take by mouth daily.    . metoprolol tartrate (LOPRESSOR) 25 MG tablet Take 1 tablet (25 mg total) by mouth 2 (two) times daily. 180 tablet 1  . metoprolol tartrate (LOPRESSOR) 25 MG tablet 25 mg.    . Netarsudil Dimesylate 0.02 % SOLN Apply to eye.    . ondansetron (ZOFRAN) 8 MG tablet Take 1 tablet (8 mg total) by mouth every 8 (eight) hours as needed for nausea. 30 tablet 3  . prednisoLONE acetate (PRED FORTE) 1 % ophthalmic suspension Apply to eye.    . prochlorperazine (COMPAZINE) 10 MG tablet Take 1 tablet (10 mg total) by mouth every 6 (six) hours as needed for nausea or vomiting. 30 tablet 0  . VYZULTA 0.024 % SOLN      No current facility-administered medications for this visit.    PHYSICAL EXAMINATION: ECOG PERFORMANCE STATUS: 0 - Asymptomatic  Vitals:   11/09/20 1338  Pulse: (!) 18  Resp: 18  Temp: (!) 97.2 F (36.2 C)  SpO2: 99%   Filed Weights   11/09/20 1338  Weight: 153 lb 3.2 oz (69.5 kg)    GENERAL:alert, no distress and comfortable SKIN: skin color, texture, turgor are normal, no rashes or significant lesions EYES: normal, Conjunctiva are pink and non-injected, sclera clear OROPHARYNX:no exudate, no erythema and lips, buccal mucosa, and tongue normal  NECK: supple, thyroid normal size, non-tender, without nodularity LYMPH:  no palpable lymphadenopathy in the cervical, axillary or inguinal LUNGS: clear to auscultation and percussion with normal breathing effort HEART: regular rate & rhythm and no murmurs and no lower extremity edema ABDOMEN:abdomen soft, non-tender and normal bowel  sounds Musculoskeletal:no cyanosis of digits and no clubbing  NEURO: alert & oriented x 3 with fluent speech, no focal motor/sensory deficits  LABORATORY DATA:  I have reviewed the data as listed    Component Value Date/Time   NA 140 11/09/2020 1319   NA 139 03/03/2019 0934   NA 142 07/09/2013 1144   K 3.3 (L) 11/09/2020 1319   K 3.7 07/09/2013 1144   CL 107 11/09/2020 1319   CO2 25 11/09/2020 1319   CO2 23 07/09/2013 1144   GLUCOSE 176 (H) 11/09/2020 1319   GLUCOSE 115 07/09/2013 1144   BUN 10 11/09/2020 1319   BUN 25 03/03/2019 0934   BUN 14.4 07/09/2013 1144   CREATININE 0.91 11/09/2020 1319   CREATININE 0.82 01/02/2017 1159   CREATININE 0.8 07/09/2013 1144   CALCIUM 9.4 11/09/2020 1319   CALCIUM 9.3 07/09/2013 1144   PROT 7.9 11/09/2020 1319   PROT 8.0 03/03/2019 0934   PROT 7.8 07/09/2013 1144   ALBUMIN 4.1 11/09/2020 1319   ALBUMIN 4.7 03/03/2019 0934   ALBUMIN 3.7 07/09/2013 1144   AST 16 11/09/2020 1319   AST 9 07/09/2013 1144   ALT 11 11/09/2020 1319   ALT <6 07/09/2013 1144   ALKPHOS 86 11/09/2020 1319   ALKPHOS 58 07/09/2013 1144   BILITOT 1.1 11/09/2020 1319   BILITOT 0.40 07/09/2013 1144   GFRNONAA >60 11/09/2020 1319   GFRNONAA 74 02/01/2015 1238   GFRAA >60 06/28/2020 1031   GFRAA 85 02/01/2015 1238    No results found for: SPEP, UPEP  Lab Results  Component Value Date   WBC 5.6 11/09/2020   NEUTROABS 3.6 11/09/2020   HGB 14.2 11/09/2020   HCT 39.2 11/09/2020   MCV 86.2 11/09/2020   PLT 158 11/09/2020      Chemistry      Component Value Date/Time   NA 140 11/09/2020 1319   NA 139 03/03/2019 0934   NA 142 07/09/2013 1144   K 3.3 (L) 11/09/2020 1319   K 3.7 07/09/2013 1144   CL 107 11/09/2020 1319   CO2 25 11/09/2020 1319   CO2 23 07/09/2013 1144   BUN 10 11/09/2020 1319   BUN 25 03/03/2019 0934   BUN 14.4 07/09/2013 1144   CREATININE 0.91 11/09/2020 1319   CREATININE 0.82 01/02/2017 1159   CREATININE 0.8 07/09/2013 1144       Component Value Date/Time   CALCIUM 9.4 11/09/2020 1319   CALCIUM 9.3 07/09/2013 1144   ALKPHOS 86 11/09/2020 1319   ALKPHOS 58 07/09/2013 1144   AST 16 11/09/2020 1319   AST 9 07/09/2013 1144   ALT 11 11/09/2020 1319   ALT <6 07/09/2013 1144   BILITOT 1.1 11/09/2020 1319   BILITOT 0.40 07/09/2013 1144

## 2020-11-10 NOTE — Assessment & Plan Note (Signed)
She has intermittent hypertension could be related to whitecoat hypertension She is not symptomatic She will continue her prescribed blood pressure medications

## 2020-12-21 ENCOUNTER — Encounter: Payer: Self-pay | Admitting: Hematology and Oncology

## 2020-12-21 ENCOUNTER — Inpatient Hospital Stay: Payer: Medicare Other | Attending: Gynecologic Oncology

## 2020-12-21 ENCOUNTER — Inpatient Hospital Stay (HOSPITAL_BASED_OUTPATIENT_CLINIC_OR_DEPARTMENT_OTHER): Payer: Medicare Other | Admitting: Hematology and Oncology

## 2020-12-21 ENCOUNTER — Inpatient Hospital Stay: Payer: Medicare Other

## 2020-12-21 ENCOUNTER — Telehealth: Payer: Self-pay | Admitting: Hematology and Oncology

## 2020-12-21 ENCOUNTER — Other Ambulatory Visit: Payer: Self-pay

## 2020-12-21 VITALS — BP 126/76 | HR 83 | Temp 98.1°F | Resp 20 | Ht 66.0 in | Wt 155.9 lb

## 2020-12-21 DIAGNOSIS — Z90722 Acquired absence of ovaries, bilateral: Secondary | ICD-10-CM | POA: Insufficient documentation

## 2020-12-21 DIAGNOSIS — I10 Essential (primary) hypertension: Secondary | ICD-10-CM

## 2020-12-21 DIAGNOSIS — Z79899 Other long term (current) drug therapy: Secondary | ICD-10-CM | POA: Insufficient documentation

## 2020-12-21 DIAGNOSIS — Z90710 Acquired absence of both cervix and uterus: Secondary | ICD-10-CM | POA: Insufficient documentation

## 2020-12-21 DIAGNOSIS — Z5112 Encounter for antineoplastic immunotherapy: Secondary | ICD-10-CM | POA: Insufficient documentation

## 2020-12-21 DIAGNOSIS — Z9079 Acquired absence of other genital organ(s): Secondary | ICD-10-CM | POA: Diagnosis not present

## 2020-12-21 DIAGNOSIS — Z7189 Other specified counseling: Secondary | ICD-10-CM

## 2020-12-21 DIAGNOSIS — C541 Malignant neoplasm of endometrium: Secondary | ICD-10-CM

## 2020-12-21 LAB — CBC WITH DIFFERENTIAL (CANCER CENTER ONLY)
Abs Immature Granulocytes: 0.01 10*3/uL (ref 0.00–0.07)
Basophils Absolute: 0 10*3/uL (ref 0.0–0.1)
Basophils Relative: 1 %
Eosinophils Absolute: 0.1 10*3/uL (ref 0.0–0.5)
Eosinophils Relative: 2 %
HCT: 42.2 % (ref 36.0–46.0)
Hemoglobin: 15 g/dL (ref 12.0–15.0)
Immature Granulocytes: 0 %
Lymphocytes Relative: 20 %
Lymphs Abs: 1.1 10*3/uL (ref 0.7–4.0)
MCH: 31.6 pg (ref 26.0–34.0)
MCHC: 35.5 g/dL (ref 30.0–36.0)
MCV: 88.8 fL (ref 80.0–100.0)
Monocytes Absolute: 0.4 10*3/uL (ref 0.1–1.0)
Monocytes Relative: 8 %
Neutro Abs: 3.7 10*3/uL (ref 1.7–7.7)
Neutrophils Relative %: 69 %
Platelet Count: 172 10*3/uL (ref 150–400)
RBC: 4.75 MIL/uL (ref 3.87–5.11)
RDW: 13.1 % (ref 11.5–15.5)
WBC Count: 5.4 10*3/uL (ref 4.0–10.5)
nRBC: 0 % (ref 0.0–0.2)

## 2020-12-21 LAB — CMP (CANCER CENTER ONLY)
ALT: 22 U/L (ref 0–44)
AST: 19 U/L (ref 15–41)
Albumin: 4.3 g/dL (ref 3.5–5.0)
Alkaline Phosphatase: 122 U/L (ref 38–126)
Anion gap: 11 (ref 5–15)
BUN: 20 mg/dL (ref 8–23)
CO2: 25 mmol/L (ref 22–32)
Calcium: 9.9 mg/dL (ref 8.9–10.3)
Chloride: 106 mmol/L (ref 98–111)
Creatinine: 0.93 mg/dL (ref 0.44–1.00)
GFR, Estimated: >60 mL/min (ref >60)
Glucose, Bld: 127 mg/dL — ABNORMAL HIGH (ref 70–99)
Potassium: 3.5 mmol/L (ref 3.5–5.1)
Sodium: 142 mmol/L (ref 135–145)
Total Bilirubin: 0.8 mg/dL (ref 0.3–1.2)
Total Protein: 8.6 g/dL — ABNORMAL HIGH (ref 6.5–8.1)

## 2020-12-21 LAB — TSH: TSH: 0.859 u[IU]/mL (ref 0.308–3.960)

## 2020-12-21 MED ORDER — SODIUM CHLORIDE 0.9 % IV SOLN
Freq: Once | INTRAVENOUS | Status: AC
  Filled 2020-12-21: qty 250

## 2020-12-21 MED ORDER — PEMBROLIZUMAB CHEMO INJECTION 100 MG/4ML
400.0000 mg | Freq: Once | INTRAVENOUS | Status: AC
  Administered 2020-12-21: 400 mg via INTRAVENOUS
  Filled 2020-12-21: qty 16

## 2020-12-21 MED ORDER — SODIUM CHLORIDE 0.9% FLUSH
10.0000 mL | INTRAVENOUS | Status: DC | PRN
  Filled 2020-12-21: qty 10

## 2020-12-21 NOTE — Assessment & Plan Note (Signed)
There were recent changes to her blood pressure medications Her blood pressure is good today Observe closely for now

## 2020-12-21 NOTE — Telephone Encounter (Signed)
Scheduled per los. Gave avs and calendar  

## 2020-12-21 NOTE — Assessment & Plan Note (Signed)
Her last imaging studies show excellent response to therapy We discussed the risk and benefits of continuing treatment She is in agreement to proceed We will continue the interval of pembrolizumab of every 6 weeks I plan to repeat imaging study again next month for further follow-up

## 2020-12-21 NOTE — Progress Notes (Signed)
[place] OFFICE PROGRESS NOTE  Patient Care Team: Buzzy Han, MD as PCP - General (Family Medicine)  ASSESSMENT & PLAN:  Endometrial cancer [facility]) Her last imaging studies show excellent response to therapy We discussed the risk and benefits of continuing treatment She is in agreement to proceed We will continue the interval of pembrolizumab of every 6 weeks I plan to repeat imaging study again next month for further follow-up  Essential hypertension There were recent changes to her blood pressure medications Her blood pressure is good today Observe closely for now   Orders Placed This Encounter  Procedures   CT ABDOMEN PELVIS W CONTRAST    Standing Status:   Future    Standing Expiration Date:   12/21/2021    Order Specific Question:   If indicated for the ordered procedure, I authorize the administration of contrast media per Radiology protocol    Answer:   Yes    Order Specific Question:   Preferred imaging location?    Answer:   [facility]    Order Specific Question:   Radiology Contrast Protocol - do NOT remove file path    Answer:   [email or web address]    All questions were answered. The patient knows to call the clinic with any problems, questions or concerns. The total time spent in the appointment was 20 minutes encounter with patients including review of chart and various tests results, discussions about plan of care and coordination of care plan   [name], MD 12/21/2020 12:43 PM  INTERVAL HISTORY: Please see below for problem oriented charting. She returns with her son for further follow-up She is doing well No side effects of treatment There were recent changes to her blood pressure medication She is wondering about future therapy  SUMMARY OF ONCOLOGIC HISTORY: Oncology History Overview Note  MMR - Abnormal   Endometrial cancer ([place])  06/01/2013 Initial Diagnosis   Endometrial  cancer   06/01/2013 Initial Biopsy   EMB for AUB Endometrium, biopsy - POSITIVE FOR ENDOMETRIAL ADENOCARCINOMA, SEE COMMENT. Microscopic Comment As sampled the endometrial adenocarcinoma appears to be endometrioid type, FIGO Grade I.   07/21/2013 Surgery   PREOPERATIVE DIAGNOSIS: Grade 1 endometrial carcinoma. Uterine fibroids    PROCEDURE: Total abdominal hysterectomy, bilateral salpingo-oophorectomy, pelvic lymphadenectomy   SURGEON: [name], M.D  SURGICAL FINDINGS: At the time of exploratory laparotomy the uterus was approximately 16-18 weeks size with multiple subserosal fibroids. Tubes and ovaries were normal. On frozen section patient had endometrial carcinoma invading only the inner half of the myometrium. Exploration the upper abdomen revealed no evidence of metastatic disease. There was no pelvic or para-aortic lymphadenopathy.     07/21/2013 Pathologic Stage   1. Uterus +/- tubes/ovaries, neoplastic - INVASIVE ENDOMETRIOID CARCINOMA, FIGO GRADE II, WITH SQUAMOUS DIFFERENTIATION, CONFINED WITHIN INNER HALF OF THE MYOMETRIUM. - LEIOMYOMATA AND ADENOMYOSIS. - CERVIX: BENIGN SQUAMOUS MUCOSA AND ENDOCERVICAL MUCOSA, NO DYSPLASIA OR MALIGNANCY. - BILATERAL OVARIES AND FALLOPIAN TUBES: NO PATHOLOGIC ABNORMALITIES. 2. Lymph nodes, regional resection, left pelvic - FOUR LYMPH NODES, NO EVIDENCE OF METASTATIC CARCINOMA (0/4). 3. Lymph nodes, regional resection, right pelvic - THREE LYMPH NODES, NEGATIVE FOR METASTATIC CARCINOMA (0/3). Microscopic Comment 1. ONCOLOGY TABLE - UTERUS, CARCINOMA OR CARCINOSARCOMA Specimen: Uterus, cervix, bilateral ovaries and fallopian tubes Procedure: Total hysterectomy and bilateral salpingo-oophorectomy Lymph node sampling performed: Yes Specimen integrity: Intact Maximum tumor size (cm): 2.4 cm, gross measurement Histologic type: Invasive endometrioid carcinoma with squamous differentiation Grade: FIGO II Myometrial invasion: 1.1 cm  where  myometrium is 3.7 cm in thickness Cervical stromal involvement: Not identified Extent of involvement of other organs: No Lymph vascular invasion: Not identified Peritoneal washings: Negative (Please correlate with [id number]) Lymph nodes: number examined 7; number positive 0 Pelvic lymph nodes: 0 involved of 7 lymph nodes Para-aortic lymph nodes: N/A TNM code: pT1a, pN0 FIGO Stage (based on pathologic findings, needs clinical correlation): IA   09/22/2019 Imaging   CT A/P: There is a left paraspinous mass which is poorly defined and low-density measuring 3 x 2.2 by 5.5 cm. Deviates the left ureter laterally. No additional periaortic masses are identified.   10/13/2019 Pathology Results   A. LYMPH NODE, LEFT PARASPINAL, NEEDLE CORE BIOPSY: - Poorly differentiated carcinoma. COMMENT: Poorly differentiated carcinoma is present in the background of a majority of necrosis and fibrosis tissue. No distinct nodal tissue is identified. Immunohistochemistry for CK7, PAX 8 and ER is positive. CK20, TTF-1, CDX-2 and GATA-3 are negative. The immunophenotype is compatible with the provided clinical history of gynecologic cancer.   10/19/2019 Cancer Staging   Staging form: Corpus Uteri - Carcinoma and Carcinosarcoma, AJCC 8th Edition - Pathologic: Stage IIIC2 (pT1a, pN2a, cM0) - Signed by [name], MD on 10/29/2019    Genetic Testing   Patient has genetic testing done for MMR. Results revealed patient has the following mutation(s): MMR - Abnormal   10/27/2019 PET scan   1. Poorly marginated hypermetabolic nodal conglomerate in the left para-aortic and left common iliac chains, compatible with nodal metastatic disease. 2. No additional hypermetabolic sites of metastatic disease. 3. Right upper lobe 2 mm solid pulmonary nodule, below PET resolution, recommend attention on follow-up chest CT in 3-6 months. 4. Chronic findings include: Aortic Atherosclerosis (ICD10-I70.0). Coronary atherosclerosis.  Mild left colonic diverticulosis. Mild biliary and pancreatic duct dilation of uncertain etiology, unchanged since 09/22/2019 CT.   11/10/2019 -  Chemotherapy    Patient is on Treatment Plan: UTERINE PEMBROLIZUMAB Q42D      02/22/2020 PET scan   1. Significant partial metabolic response. Left para-aortic and left common iliac adenopathy is decreased in size and significantly decreased in metabolism. 2. No new or progressive hypermetabolic metastatic disease. 3. Aortic Atherosclerosis (ICD10-I70.0). Additional chronic findings as detailed.   05/16/2020 Imaging   Further decrease in mild left paraaortic and left common iliac lymphadenopathy.   No new or progressive disease within the abdomen or pelvis.   Aortic Atherosclerosis (ICD10-I70.0).     09/19/2020 Imaging   1. Mild response to therapy as evidenced by decrease in abdominopelvic adenopathy. 2. No new sites of disease. 3. Hysterectomy. 4.  Aortic Atherosclerosis (ICD10-I70.0).       REVIEW OF SYSTEMS:   Constitutional: Denies fevers, chills or abnormal weight loss Eyes: Denies blurriness of vision Ears, nose, mouth, throat, and face: Denies mucositis or sore throat Respiratory: Denies cough, dyspnea or wheezes Cardiovascular: Denies palpitation, chest discomfort or lower extremity swelling Gastrointestinal:  Denies nausea, heartburn or change in bowel habits Skin: Denies abnormal skin rashes Lymphatics: Denies new lymphadenopathy or easy bruising Neurological:Denies numbness, tingling or new weaknesses Behavioral/Psych: Mood is stable, no new changes  All other systems were reviewed with the patient and are negative.  I have reviewed the past medical history, past surgical history, social history and family history with the patient and they are unchanged from previous note.  ALLERGIES:  is allergic to iron, iron dextran, penicillins, amoxicillin, ampicillin, codeine, diamox [acetazolamide], penicillin g, and  other.  MEDICATIONS:  Current Outpatient Medications  Medication Sig Dispense Refill  benazepril (LOTENSIN) 20 MG tablet Take 20 mg by mouth daily.     amLODipine (NORVASC) 10 MG tablet Take by mouth.     atorvastatin (LIPITOR) 20 MG tablet Take 1 tablet (20 mg total) by mouth daily. 30 tablet 3   brimonidine (ALPHAGAN) 0.2 % ophthalmic solution      busPIRone (BUSPAR) 7.5 MG tablet Take 7.5 mg by mouth 2 (two) times daily.     dorzolamide-timolol (COSOPT) 22.3-6.8 MG/ML ophthalmic solution Place 1 drop into the left eye 2 (two) times daily.     hydrochlorothiazide (HYDRODIURIL) 25 MG tablet Take 1 tablet (25 mg total) by mouth daily. Discontinue amlodipine 90 tablet 1   latanoprost (XALATAN) 0.005 % ophthalmic solution Apply to eye.     magnesium 30 MG tablet Take by mouth daily.     metoprolol tartrate (LOPRESSOR) 25 MG tablet Take 1 tablet (25 mg total) by mouth 2 (two) times daily. 180 tablet 1   metoprolol tartrate (LOPRESSOR) 25 MG tablet 25 mg.     Netarsudil Dimesylate 0.02 % SOLN Apply to eye.     ondansetron (ZOFRAN) 8 MG tablet Take 1 tablet (8 mg total) by mouth every 8 (eight) hours as needed for nausea. 30 tablet 3   prednisoLONE acetate (PRED FORTE) 1 % ophthalmic suspension Apply to eye.     prochlorperazine (COMPAZINE) 10 MG tablet Take 1 tablet (10 mg total) by mouth every 6 (six) hours as needed for nausea or vomiting. 30 tablet 0   VYZULTA 0.024 % SOLN      No current facility-administered medications for this visit.    PHYSICAL EXAMINATION: ECOG PERFORMANCE STATUS: 1 - Symptomatic but completely ambulatory  Vitals:   12/21/20 1232  BP: 126/76  Pulse: 83  Resp: 20  Temp: 98.1 F (36.7 C)  SpO2: 100%   Filed Weights   12/21/20 1232  Weight: 155 lb 14.4 oz (70.7 kg)    GENERAL:alert, no distress and comfortable SKIN: skin color, texture, turgor are normal, no rashes or significant lesions EYES: normal, Conjunctiva are pink and  non-injected, sclera clear OROPHARYNX:no exudate, no erythema and lips, buccal mucosa, and tongue normal  NECK: supple, thyroid normal size, non-tender, without nodularity LYMPH:  no palpable lymphadenopathy in the cervical, axillary or inguinal LUNGS: clear to auscultation and percussion with normal breathing effort HEART: regular rate & rhythm and no murmurs and no lower extremity edema ABDOMEN:abdomen soft, non-tender and normal bowel sounds Musculoskeletal:no cyanosis of digits and no clubbing  NEURO: alert & oriented x 3 with fluent speech, no focal motor/sensory deficits  LABORATORY DATA:  I have reviewed the data as listed    Component Value Date/Time   NA 140 11/09/2020 1319   NA 139 03/03/2019 0934   NA 142 07/09/2013 1144   K 3.3 (L) 11/09/2020 1319   K 3.7 07/09/2013 1144   CL 107 11/09/2020 1319   CO2 25 11/09/2020 1319   CO2 23 07/09/2013 1144   GLUCOSE 176 (H) 11/09/2020 1319   GLUCOSE 115 07/09/2013 1144   BUN 10 11/09/2020 1319   BUN 25 03/03/2019 0934   BUN 14.4 07/09/2013 1144   CREATININE 0.91 11/09/2020 1319   CREATININE 0.82 01/02/2017 1159   CREATININE 0.8 07/09/2013 1144   CALCIUM 9.4 11/09/2020 1319   CALCIUM 9.3 07/09/2013 1144   PROT 7.9 11/09/2020 1319   PROT 8.0 03/03/2019 0934   PROT 7.8 07/09/2013 1144   ALBUMIN 4.1 11/09/2020 1319   ALBUMIN 4.7 03/03/2019 0934   ALBUMIN  3.7 07/09/2013 1144   AST 16 11/09/2020 1319   AST 9 07/09/2013 1144   ALT 11 11/09/2020 1319   ALT <6 07/09/2013 1144   ALKPHOS 86 11/09/2020 1319   ALKPHOS 58 07/09/2013 1144   BILITOT 1.1 11/09/2020 1319   BILITOT 0.40 07/09/2013 1144   GFRNONAA >60 11/09/2020 1319   GFRNONAA 74 02/01/2015 1238   GFRAA >60 06/28/2020 1031   GFRAA 85 02/01/2015 1238    No results found for: SPEP, UPEP  Lab Results  Component Value Date   WBC 5.4 12/21/2020   NEUTROABS 3.7 12/21/2020   HGB 15.0 12/21/2020   HCT 42.2 12/21/2020   MCV 88.8 12/21/2020   PLT 172 12/21/2020       Chemistry      Component Value Date/Time   NA 140 11/09/2020 1319   NA 139 03/03/2019 0934   NA 142 07/09/2013 1144   K 3.3 (L) 11/09/2020 1319   K 3.7 07/09/2013 1144   CL 107 11/09/2020 1319   CO2 25 11/09/2020 1319   CO2 23 07/09/2013 1144   BUN 10 11/09/2020 1319   BUN 25 03/03/2019 0934   BUN 14.4 07/09/2013 1144   CREATININE 0.91 11/09/2020 1319   CREATININE 0.82 01/02/2017 1159   CREATININE 0.8 07/09/2013 1144      Component Value Date/Time   CALCIUM 9.4 11/09/2020 1319   CALCIUM 9.3 07/09/2013 1144   ALKPHOS 86 11/09/2020 1319   ALKPHOS 58 07/09/2013 1144   AST 16 11/09/2020 1319   AST 9 07/09/2013 1144   ALT 11 11/09/2020 1319   ALT <6 07/09/2013 1144   BILITOT 1.1 11/09/2020 1319   BILITOT 0.40 07/09/2013 1144

## 2020-12-21 NOTE — Patient Instructions (Signed)
Cancer Center Discharge Instructions for Patients Receiving Chemotherapy  Today you received the following chemotherapy agents: Pembrolizumab (Keytruda)  To help prevent nausea and vomiting after your treatment, we encourage you to take your nausea medication as directed.    If you develop nausea and vomiting that is not controlled by your nausea medication, call the clinic.   BELOW ARE SYMPTOMS THAT SHOULD BE REPORTED IMMEDIATELY:  *FEVER GREATER THAN 100.5 F  *CHILLS WITH OR WITHOUT FEVER  NAUSEA AND VOMITING THAT IS NOT CONTROLLED WITH YOUR NAUSEA MEDICATION  *UNUSUAL SHORTNESS OF BREATH  *UNUSUAL BRUISING OR BLEEDING  TENDERNESS IN MOUTH AND THROAT WITH OR WITHOUT PRESENCE OF ULCERS  *URINARY PROBLEMS  *BOWEL PROBLEMS  UNUSUAL RASH Items with * indicate a potential emergency and should be followed up as soon as possible.  Feel free to call the clinic should you have any questions or concerns. The clinic phone number is [phone].  Please show the CHEMO ALERT CARD at check-in to the Emergency Department and triage nurse. 

## 2021-01-31 ENCOUNTER — Other Ambulatory Visit: Payer: Self-pay

## 2021-01-31 ENCOUNTER — Inpatient Hospital Stay: Payer: Medicare Other | Attending: Gynecologic Oncology

## 2021-01-31 ENCOUNTER — Ambulatory Visit (HOSPITAL_COMMUNITY)
Admission: RE | Admit: 2021-01-31 | Discharge: 2021-01-31 | Disposition: A | Discharge status: Home / Self Care | Payer: Medicare Other | Source: Ambulatory Visit | Attending: Hematology and Oncology | Admitting: Hematology and Oncology

## 2021-01-31 DIAGNOSIS — Z79899 Other long term (current) drug therapy: Secondary | ICD-10-CM | POA: Diagnosis not present

## 2021-01-31 DIAGNOSIS — Z5112 Encounter for antineoplastic immunotherapy: Secondary | ICD-10-CM | POA: Insufficient documentation

## 2021-01-31 DIAGNOSIS — C541 Malignant neoplasm of endometrium: Secondary | ICD-10-CM

## 2021-01-31 DIAGNOSIS — Z7189 Other specified counseling: Secondary | ICD-10-CM

## 2021-01-31 LAB — CBC WITH DIFFERENTIAL (CANCER CENTER ONLY)
Abs Immature Granulocytes: 0.02 10*3/uL (ref 0.00–0.07)
Basophils Absolute: 0 10*3/uL (ref 0.0–0.1)
Basophils Relative: 1 %
Eosinophils Absolute: 0.1 10*3/uL (ref 0.0–0.5)
Eosinophils Relative: 2 %
HCT: 41.3 % (ref 36.0–46.0)
Hemoglobin: 14.4 g/dL (ref 12.0–15.0)
Immature Granulocytes: 0 %
Lymphocytes Relative: 25 %
Lymphs Abs: 1.5 10*3/uL (ref 0.7–4.0)
MCH: 31 pg (ref 26.0–34.0)
MCHC: 34.9 g/dL (ref 30.0–36.0)
MCV: 88.8 fL (ref 80.0–100.0)
Monocytes Absolute: 0.5 10*3/uL (ref 0.1–1.0)
Monocytes Relative: 8 %
Neutro Abs: 3.9 10*3/uL (ref 1.7–7.7)
Neutrophils Relative %: 64 %
Platelet Count: 173 10*3/uL (ref 150–400)
RBC: 4.65 MIL/uL (ref 3.87–5.11)
RDW: 13 % (ref 11.5–15.5)
WBC Count: 6.1 10*3/uL (ref 4.0–10.5)
nRBC: 0 % (ref 0.0–0.2)

## 2021-01-31 LAB — CMP (CANCER CENTER ONLY)
ALT: 13 U/L (ref 0–44)
AST: 17 U/L (ref 15–41)
Albumin: 4.4 g/dL (ref 3.5–5.0)
Alkaline Phosphatase: 113 U/L (ref 38–126)
Anion gap: 11 (ref 5–15)
BUN: 15 mg/dL (ref 8–23)
CO2: 26 mmol/L (ref 22–32)
Calcium: 9.4 mg/dL (ref 8.9–10.3)
Chloride: 106 mmol/L (ref 98–111)
Creatinine: 0.92 mg/dL (ref 0.44–1.00)
GFR, Estimated: >60 mL/min (ref >60)
Glucose, Bld: 94 mg/dL (ref 70–99)
Potassium: 3.4 mmol/L — ABNORMAL LOW (ref 3.5–5.1)
Sodium: 143 mmol/L (ref 135–145)
Total Bilirubin: 0.7 mg/dL (ref 0.3–1.2)
Total Protein: 8.3 g/dL — ABNORMAL HIGH (ref 6.5–8.1)

## 2021-01-31 LAB — TSH: TSH: 2.189 u[IU]/mL (ref 0.308–3.960)

## 2021-01-31 MED ORDER — IOHEXOL 300 MG/ML  SOLN
100.0000 mL | Freq: Once | INTRAMUSCULAR | Status: AC | PRN
  Administered 2021-01-31: 100 mL via INTRAVENOUS

## 2021-02-01 ENCOUNTER — Encounter: Payer: Self-pay | Admitting: Hematology and Oncology

## 2021-02-01 ENCOUNTER — Inpatient Hospital Stay (HOSPITAL_BASED_OUTPATIENT_CLINIC_OR_DEPARTMENT_OTHER): Payer: Medicare Other | Admitting: Hematology and Oncology

## 2021-02-01 ENCOUNTER — Inpatient Hospital Stay: Payer: Medicare Other

## 2021-02-01 DIAGNOSIS — K5909 Other constipation: Secondary | ICD-10-CM

## 2021-02-01 DIAGNOSIS — I10 Essential (primary) hypertension: Secondary | ICD-10-CM

## 2021-02-01 DIAGNOSIS — C541 Malignant neoplasm of endometrium: Secondary | ICD-10-CM | POA: Diagnosis not present

## 2021-02-01 DIAGNOSIS — Z5112 Encounter for antineoplastic immunotherapy: Secondary | ICD-10-CM | POA: Diagnosis not present

## 2021-02-01 DIAGNOSIS — Z7189 Other specified counseling: Secondary | ICD-10-CM

## 2021-02-01 MED ORDER — SODIUM CHLORIDE 0.9 % IV SOLN
400.0000 mg | Freq: Once | INTRAVENOUS | Status: AC
  Administered 2021-02-01: 400 mg via INTRAVENOUS
  Filled 2021-02-01: qty 16

## 2021-02-01 MED ORDER — SODIUM CHLORIDE 0.9 % IV SOLN
Freq: Once | INTRAVENOUS | Status: AC
  Filled 2021-02-01: qty 250

## 2021-02-01 NOTE — Patient Instructions (Signed)
[facility] Discharge Instructions for Patients Receiving Chemotherapy  Today you received the following chemotherapy agents:  Keytruda.  To help prevent nausea and vomiting after your treatment, we encourage you to take your nausea medication as directed.   If you develop nausea and vomiting that is not controlled by your nausea medication, call the clinic.   BELOW ARE SYMPTOMS THAT SHOULD BE REPORTED IMMEDIATELY:  *FEVER GREATER THAN 100.5 F  *CHILLS WITH OR WITHOUT FEVER  NAUSEA AND VOMITING THAT IS NOT CONTROLLED WITH YOUR NAUSEA MEDICATION  *UNUSUAL SHORTNESS OF BREATH  *UNUSUAL BRUISING OR BLEEDING  TENDERNESS IN MOUTH AND THROAT WITH OR WITHOUT PRESENCE OF ULCERS  *URINARY PROBLEMS  *BOWEL PROBLEMS  UNUSUAL RASH Items with * indicate a potential emergency and should be followed up as soon as possible.  Feel free to call the clinic should you have any questions or concerns. The clinic phone number is [phone].  Please show the CHEMO ALERT CARD at check-in to the Emergency Department and triage nurse.    

## 2021-02-01 NOTE — Progress Notes (Signed)
[place] OFFICE PROGRESS NOTE  Patient Care Team: Buzzy Han, MD as PCP - General (Family Medicine)  ASSESSMENT & PLAN:  Endometrial cancer [facility]) Her last imaging studies showed excellent response to therapy We discussed the risk and benefits of continuing treatment She is in agreement to proceed We will continue the interval of pembrolizumab of every 6 weeks I plan to repeat imaging study again in 6 months, due around October  Essential hypertension There were recent changes to her blood pressure medications Her blood pressure is typically normal at home but elevated today likely due to anxiety Observe closely for now  Other constipation I recommend she continue MiraLAX and to add more fiber in her diet   No orders of the defined types were placed in this encounter.   All questions were answered. The patient knows to call the clinic with any problems, questions or concerns. The total time spent in the appointment was 20 minutes encounter with patients including review of chart and various tests results, discussions about plan of care and coordination of care plan   [name], MD 02/01/2021 11:58 AM  INTERVAL HISTORY: Please see below for problem oriented charting. She returns with her son for further follow-up She tolerated treatment very well without side effects or infusion reaction She continue taking MiraLAX and have occasional loose stool Her blood pressure at home is satisfactory  SUMMARY OF ONCOLOGIC HISTORY: Oncology History Overview Note  MMR - Abnormal   Endometrial cancer ([place])  06/01/2013 Initial Diagnosis   Endometrial cancer   06/01/2013 Initial Biopsy   EMB for AUB Endometrium, biopsy - POSITIVE FOR ENDOMETRIAL ADENOCARCINOMA, SEE COMMENT. Microscopic Comment As sampled the endometrial adenocarcinoma appears to be endometrioid type, FIGO Grade I.   07/21/2013 Surgery   PREOPERATIVE DIAGNOSIS: Grade 1 endometrial  carcinoma. Uterine fibroids    PROCEDURE: Total abdominal hysterectomy, bilateral salpingo-oophorectomy, pelvic lymphadenectomy   SURGEON: [name], M.D  SURGICAL FINDINGS: At the time of exploratory laparotomy the uterus was approximately 16-18 weeks size with multiple subserosal fibroids. Tubes and ovaries were normal. On frozen section patient had endometrial carcinoma invading only the inner half of the myometrium. Exploration the upper abdomen revealed no evidence of metastatic disease. There was no pelvic or para-aortic lymphadenopathy.     07/21/2013 Pathologic Stage   1. Uterus +/- tubes/ovaries, neoplastic - INVASIVE ENDOMETRIOID CARCINOMA, FIGO GRADE II, WITH SQUAMOUS DIFFERENTIATION, CONFINED WITHIN INNER HALF OF THE MYOMETRIUM. - LEIOMYOMATA AND ADENOMYOSIS. - CERVIX: BENIGN SQUAMOUS MUCOSA AND ENDOCERVICAL MUCOSA, NO DYSPLASIA OR MALIGNANCY. - BILATERAL OVARIES AND FALLOPIAN TUBES: NO PATHOLOGIC ABNORMALITIES. 2. Lymph nodes, regional resection, left pelvic - FOUR LYMPH NODES, NO EVIDENCE OF METASTATIC CARCINOMA (0/4). 3. Lymph nodes, regional resection, right pelvic - THREE LYMPH NODES, NEGATIVE FOR METASTATIC CARCINOMA (0/3). Microscopic Comment 1. ONCOLOGY TABLE - UTERUS, CARCINOMA OR CARCINOSARCOMA Specimen: Uterus, cervix, bilateral ovaries and fallopian tubes Procedure: Total hysterectomy and bilateral salpingo-oophorectomy Lymph node sampling performed: Yes Specimen integrity: Intact Maximum tumor size (cm): 2.4 cm, gross measurement Histologic type: Invasive endometrioid carcinoma with squamous differentiation Grade: FIGO II Myometrial invasion: 1.1 cm where myometrium is 3.7 cm in thickness Cervical stromal involvement: Not identified Extent of involvement of other organs: No Lymph vascular invasion: Not identified Peritoneal washings: Negative (Please correlate with [id number]) Lymph nodes: number examined 7; number positive 0 Pelvic lymph nodes: 0  involved of 7 lymph nodes Para-aortic lymph nodes: N/A TNM code: pT1a, pN0 FIGO Stage (based on pathologic findings, needs clinical correlation): IA  09/22/2019 Imaging   CT A/P: There is a left paraspinous mass which is poorly defined and low-density measuring 3 x 2.2 by 5.5 cm. Deviates the left ureter laterally. No additional periaortic masses are identified.   10/13/2019 Pathology Results   A. LYMPH NODE, LEFT PARASPINAL, NEEDLE CORE BIOPSY: - Poorly differentiated carcinoma. COMMENT: Poorly differentiated carcinoma is present in the background of a majority of necrosis and fibrosis tissue. No distinct nodal tissue is identified. Immunohistochemistry for CK7, PAX 8 and ER is positive. CK20, TTF-1, CDX-2 and GATA-3 are negative. The immunophenotype is compatible with the provided clinical history of gynecologic cancer.   10/19/2019 Cancer Staging   Staging form: Corpus Uteri - Carcinoma and Carcinosarcoma, AJCC 8th Edition - Pathologic: Stage IIIC2 (pT1a, pN2a, cM0) - Signed by [name], MD on 10/29/2019    Genetic Testing   Patient has genetic testing done for MMR. Results revealed patient has the following mutation(s): MMR - Abnormal   10/27/2019 PET scan   1. Poorly marginated hypermetabolic nodal conglomerate in the left para-aortic and left common iliac chains, compatible with nodal metastatic disease. 2. No additional hypermetabolic sites of metastatic disease. 3. Right upper lobe 2 mm solid pulmonary nodule, below PET resolution, recommend attention on follow-up chest CT in 3-6 months. 4. Chronic findings include: Aortic Atherosclerosis (ICD10-I70.0). Coronary atherosclerosis. Mild left colonic diverticulosis. Mild biliary and pancreatic duct dilation of uncertain etiology, unchanged since 09/22/2019 CT.   11/10/2019 -  Chemotherapy    Patient is on Treatment Plan: UTERINE PEMBROLIZUMAB Q42D      02/22/2020 PET scan   1. Significant partial metabolic response. Left  para-aortic and left common iliac adenopathy is decreased in size and significantly decreased in metabolism. 2. No new or progressive hypermetabolic metastatic disease. 3. Aortic Atherosclerosis (ICD10-I70.0). Additional chronic findings as detailed.   05/16/2020 Imaging   Further decrease in mild left paraaortic and left common iliac lymphadenopathy.   No new or progressive disease within the abdomen or pelvis.   Aortic Atherosclerosis (ICD10-I70.0).     09/19/2020 Imaging   1. Mild response to therapy as evidenced by decrease in abdominopelvic adenopathy. 2. No new sites of disease. 3. Hysterectomy. 4.  Aortic Atherosclerosis (ICD10-I70.0).     01/31/2021 Imaging   1. Stable exam. Unchanged appearance borderline abdominopelvic adenopathy. 2. No new sites of disease. 3. Status post hysterectomy with stable, mild soft tissue thickening along the right side of vaginal cuff. 4.  Aortic Atherosclerosis (ICD10-I70.0).     REVIEW OF SYSTEMS:   Constitutional: Denies fevers, chills or abnormal weight loss Eyes: Denies blurriness of vision Ears, nose, mouth, throat, and face: Denies mucositis or sore throat Respiratory: Denies cough, dyspnea or wheezes Cardiovascular: Denies palpitation, chest discomfort or lower extremity swelling Gastrointestinal:  Denies nausea, heartburn or change in bowel habits Skin: Denies abnormal skin rashes Lymphatics: Denies new lymphadenopathy or easy bruising Neurological:Denies numbness, tingling or new weaknesses Behavioral/Psych: Mood is stable, no new changes  All other systems were reviewed with the patient and are negative.  I have reviewed the past medical history, past surgical history, social history and family history with the patient and they are unchanged from previous note.  ALLERGIES:  is allergic to iron, iron dextran, penicillins, amoxicillin, ampicillin, codeine, diamox [acetazolamide], penicillin g, and other.  MEDICATIONS:  Current  Outpatient Medications  Medication Sig Dispense Refill  . amLODipine (NORVASC) 10 MG tablet Take by mouth.    Marland Kitchen atorvastatin (LIPITOR) 20 MG tablet Take 1 tablet (20 mg total) by  mouth daily. 30 tablet 3  . benazepril (LOTENSIN) 20 MG tablet Take 20 mg by mouth daily.    . brimonidine (ALPHAGAN) 0.2 % ophthalmic solution     . busPIRone (BUSPAR) 7.5 MG tablet Take 7.5 mg by mouth 2 (two) times daily.    . dorzolamide-timolol (COSOPT) 22.3-6.8 MG/ML ophthalmic solution Place 1 drop into the left eye 2 (two) times daily.    . hydrochlorothiazide (HYDRODIURIL) 25 MG tablet Take 1 tablet (25 mg total) by mouth daily. Discontinue amlodipine 90 tablet 1  . latanoprost (XALATAN) 0.005 % ophthalmic solution Apply to eye.    . magnesium 30 MG tablet Take by mouth daily.    . metoprolol tartrate (LOPRESSOR) 25 MG tablet Take 1 tablet (25 mg total) by mouth 2 (two) times daily. 180 tablet 1  . metoprolol tartrate (LOPRESSOR) 25 MG tablet 25 mg.    . Netarsudil Dimesylate 0.02 % SOLN Apply to eye.    . ondansetron (ZOFRAN) 8 MG tablet Take 1 tablet (8 mg total) by mouth every 8 (eight) hours as needed for nausea. 30 tablet 3  . prednisoLONE acetate (PRED FORTE) 1 % ophthalmic suspension Apply to eye.    . prochlorperazine (COMPAZINE) 10 MG tablet Take 1 tablet (10 mg total) by mouth every 6 (six) hours as needed for nausea or vomiting. 30 tablet 0  . VYZULTA 0.024 % SOLN      No current facility-administered medications for this visit.    PHYSICAL EXAMINATION: ECOG PERFORMANCE STATUS: 0 - Asymptomatic  Vitals:   02/01/21 1154  BP: (!) 164/90  Pulse: 97  Resp: 18  Temp: (!) 97.4 F (36.3 C)  SpO2: 99%   Filed Weights   02/01/21 1154  Weight: 154 lb 3.2 oz (69.9 kg)    GENERAL:alert, no distress and comfortable NEURO: alert & oriented x 3 with fluent speech, no focal motor/sensory deficits  LABORATORY DATA:  I have reviewed the data as listed    Component Value Date/Time   NA 143  01/31/2021 1016   NA 139 03/03/2019 0934   NA 142 07/09/2013 1144   K 3.4 (L) 01/31/2021 1016   K 3.7 07/09/2013 1144   CL 106 01/31/2021 1016   CO2 26 01/31/2021 1016   CO2 23 07/09/2013 1144   GLUCOSE 94 01/31/2021 1016   GLUCOSE 115 07/09/2013 1144   BUN 15 01/31/2021 1016   BUN 25 03/03/2019 0934   BUN 14.4 07/09/2013 1144   CREATININE 0.92 01/31/2021 1016   CREATININE 0.82 01/02/2017 1159   CREATININE 0.8 07/09/2013 1144   CALCIUM 9.4 01/31/2021 1016   CALCIUM 9.3 07/09/2013 1144   PROT 8.3 (H) 01/31/2021 1016   PROT 8.0 03/03/2019 0934   PROT 7.8 07/09/2013 1144   ALBUMIN 4.4 01/31/2021 1016   ALBUMIN 4.7 03/03/2019 0934   ALBUMIN 3.7 07/09/2013 1144   AST 17 01/31/2021 1016   AST 9 07/09/2013 1144   ALT 13 01/31/2021 1016   ALT <6 07/09/2013 1144   ALKPHOS 113 01/31/2021 1016   ALKPHOS 58 07/09/2013 1144   BILITOT 0.7 01/31/2021 1016   BILITOT 0.40 07/09/2013 1144   GFRNONAA >60 01/31/2021 1016   GFRNONAA 74 02/01/2015 1238   GFRAA >60 06/28/2020 1031   GFRAA 85 02/01/2015 1238    No results found for: SPEP, UPEP  Lab Results  Component Value Date   WBC 6.1 01/31/2021   NEUTROABS 3.9 01/31/2021   HGB 14.4 01/31/2021   HCT 41.3 01/31/2021   MCV 88.8  01/31/2021   PLT 173 01/31/2021      Chemistry      Component Value Date/Time   NA 143 01/31/2021 1016   NA 139 03/03/2019 0934   NA 142 07/09/2013 1144   K 3.4 (L) 01/31/2021 1016   K 3.7 07/09/2013 1144   CL 106 01/31/2021 1016   CO2 26 01/31/2021 1016   CO2 23 07/09/2013 1144   BUN 15 01/31/2021 1016   BUN 25 03/03/2019 0934   BUN 14.4 07/09/2013 1144   CREATININE 0.92 01/31/2021 1016   CREATININE 0.82 01/02/2017 1159   CREATININE 0.8 07/09/2013 1144      Component Value Date/Time   CALCIUM 9.4 01/31/2021 1016   CALCIUM 9.3 07/09/2013 1144   ALKPHOS 113 01/31/2021 1016   ALKPHOS 58 07/09/2013 1144   AST 17 01/31/2021 1016   AST 9 07/09/2013 1144   ALT 13 01/31/2021 1016   ALT <6  07/09/2013 1144   BILITOT 0.7 01/31/2021 1016   BILITOT 0.40 07/09/2013 1144       RADIOGRAPHIC STUDIES: I have personally reviewed the radiological images as listed and agreed with the findings in the report. CT ABDOMEN PELVIS W CONTRAST  Result Date: 01/31/2021 CLINICAL DATA:  Uterine/cervical cancer, staging. History of endometrial carcinoma diagnosed in 2014 status post hysterectomy. EXAM: CT ABDOMEN AND PELVIS WITH CONTRAST TECHNIQUE: Multidetector CT imaging of the abdomen and pelvis was performed using the standard protocol following bolus administration of intravenous contrast. CONTRAST:  187m OMNIPAQUE IOHEXOL 300 MG/ML  SOLN COMPARISON:  09/19/2020 FINDINGS: Lower chest: No acute abnormality. Hepatobiliary: Unchanged appearance of scattered low-attenuation liver lesions which are favored to represent small cysts. No suspicious liver lesions. Gallbladder appears collapsed. Fusiform dilatation of the common bile duct is unchanged measuring up to 1 cm, image 37/5. Pancreas: Unremarkable. No pancreatic ductal dilatation or surrounding inflammatory changes. Spleen: Normal in size without focal abnormality. Adrenals/Urinary Tract: Adrenal glands are unremarkable. A few small, subcentimeter low-density left kidney lesions are identified, which are too small to reliably characterize. Bladder is unremarkable. Stomach/Bowel: The stomach appears normal. The appendix is visualized and appears within normal limits. No bowel wall thickening, inflammation or distension. Vascular/Lymphatic: Aortic atherosclerosis. No aneurysm. Index left periaortic lymph node measures 1.2 cm, image 47/2. Previously 1.4 cm. Left common iliac node measures 0.7 cm, image 49/2. Previously 0.9 cm. Right external iliac lymph node measures 1 cm, image 75/2. Stable from previous exam. No signs of progressive or new adenopathy within the abdomen or pelvis. Reproductive: Status post hysterectomy. No adnexal masses. Stable, nonspecific  thickening along the right side of vaginal cuff. Other: No ascites or focal fluid collections. No peritoneal nodularity identified at this time. Musculoskeletal: No acute or significant osseous findings. IMPRESSION: 1. Stable exam. Unchanged appearance borderline abdominopelvic adenopathy. 2. No new sites of disease. 3. Status post hysterectomy with stable, mild soft tissue thickening along the right side of vaginal cuff. 4.  Aortic Atherosclerosis (ICD10-I70.0). Electronically Signed   By: TKerby MoorsM.D.   On: 01/31/2021 16:39

## 2021-02-01 NOTE — Assessment & Plan Note (Signed)
I recommend she continue MiraLAX and to add more fiber in her diet

## 2021-02-01 NOTE — Assessment & Plan Note (Signed)
There were recent changes to her blood pressure medications Her blood pressure is typically normal at home but elevated today likely due to anxiety Observe closely for now

## 2021-02-01 NOTE — Assessment & Plan Note (Signed)
Her last imaging studies showed excellent response to therapy We discussed the risk and benefits of continuing treatment She is in agreement to proceed We will continue the interval of pembrolizumab of every 6 weeks I plan to repeat imaging study again in 6 months, due around October

## 2021-03-01 ENCOUNTER — Telehealth: Payer: Self-pay | Admitting: Hematology and Oncology

## 2021-03-01 NOTE — Telephone Encounter (Signed)
Changed appointment per provider. Patient is aware.

## 2021-03-15 ENCOUNTER — Inpatient Hospital Stay: Payer: Medicare Other

## 2021-03-15 ENCOUNTER — Telehealth: Payer: Self-pay | Admitting: Hematology and Oncology

## 2021-03-15 ENCOUNTER — Inpatient Hospital Stay: Payer: Medicare Other | Admitting: Hematology and Oncology

## 2021-03-15 ENCOUNTER — Other Ambulatory Visit: Payer: Self-pay

## 2021-03-15 ENCOUNTER — Inpatient Hospital Stay: Payer: Medicare Other | Attending: Gynecologic Oncology

## 2021-03-15 VITALS — BP 125/72 | HR 89 | Temp 98.1°F | Resp 16 | Wt 155.0 lb

## 2021-03-15 DIAGNOSIS — I10 Essential (primary) hypertension: Secondary | ICD-10-CM

## 2021-03-15 DIAGNOSIS — Z299 Encounter for prophylactic measures, unspecified: Secondary | ICD-10-CM | POA: Diagnosis not present

## 2021-03-15 DIAGNOSIS — C541 Malignant neoplasm of endometrium: Secondary | ICD-10-CM

## 2021-03-15 DIAGNOSIS — Z5112 Encounter for antineoplastic immunotherapy: Secondary | ICD-10-CM | POA: Diagnosis not present

## 2021-03-15 DIAGNOSIS — Z79899 Other long term (current) drug therapy: Secondary | ICD-10-CM | POA: Insufficient documentation

## 2021-03-15 DIAGNOSIS — Z7189 Other specified counseling: Secondary | ICD-10-CM

## 2021-03-15 LAB — CMP (CANCER CENTER ONLY)
ALT: 12 U/L (ref 0–44)
AST: 16 U/L (ref 15–41)
Albumin: 4.4 g/dL (ref 3.5–5.0)
Alkaline Phosphatase: 93 U/L (ref 38–126)
Anion gap: 13 (ref 5–15)
BUN: 11 mg/dL (ref 8–23)
CO2: 24 mmol/L (ref 22–32)
Calcium: 9.8 mg/dL (ref 8.9–10.3)
Chloride: 107 mmol/L (ref 98–111)
Creatinine: 0.77 mg/dL (ref 0.44–1.00)
GFR, Estimated: >60 mL/min (ref >60)
Glucose, Bld: 101 mg/dL — ABNORMAL HIGH (ref 70–99)
Potassium: 3.6 mmol/L (ref 3.5–5.1)
Sodium: 144 mmol/L (ref 135–145)
Total Bilirubin: 1 mg/dL (ref 0.3–1.2)
Total Protein: 8.6 g/dL — ABNORMAL HIGH (ref 6.5–8.1)

## 2021-03-15 LAB — CBC WITH DIFFERENTIAL (CANCER CENTER ONLY)
Abs Immature Granulocytes: 0.01 10*3/uL (ref 0.00–0.07)
Basophils Absolute: 0 10*3/uL (ref 0.0–0.1)
Basophils Relative: 1 %
Eosinophils Absolute: 0.1 10*3/uL (ref 0.0–0.5)
Eosinophils Relative: 2 %
HCT: 42.1 % (ref 36.0–46.0)
Hemoglobin: 15 g/dL (ref 12.0–15.0)
Immature Granulocytes: 0 %
Lymphocytes Relative: 26 %
Lymphs Abs: 1.3 10*3/uL (ref 0.7–4.0)
MCH: 31.4 pg (ref 26.0–34.0)
MCHC: 35.6 g/dL (ref 30.0–36.0)
MCV: 88.1 fL (ref 80.0–100.0)
Monocytes Absolute: 0.5 10*3/uL (ref 0.1–1.0)
Monocytes Relative: 9 %
Neutro Abs: 3.1 10*3/uL (ref 1.7–7.7)
Neutrophils Relative %: 62 %
Platelet Count: 171 10*3/uL (ref 150–400)
RBC: 4.78 MIL/uL (ref 3.87–5.11)
RDW: 12.7 % (ref 11.5–15.5)
WBC Count: 5.1 10*3/uL (ref 4.0–10.5)
nRBC: 0 % (ref 0.0–0.2)

## 2021-03-15 LAB — TSH: TSH: 1.219 u[IU]/mL (ref 0.308–3.960)

## 2021-03-15 MED ORDER — SODIUM CHLORIDE 0.9 % IV SOLN
Freq: Once | INTRAVENOUS | Status: AC
  Filled 2021-03-15: qty 250

## 2021-03-15 MED ORDER — SODIUM CHLORIDE 0.9 % IV SOLN
400.0000 mg | Freq: Once | INTRAVENOUS | Status: AC
  Administered 2021-03-15: 400 mg via INTRAVENOUS
  Filled 2021-03-15: qty 16

## 2021-03-15 NOTE — Patient Instructions (Signed)
[facility] Discharge Instructions for Patients Receiving Chemotherapy  Today you received the following chemotherapy agents:  Keytruda.  To help prevent nausea and vomiting after your treatment, we encourage you to take your nausea medication as directed.   If you develop nausea and vomiting that is not controlled by your nausea medication, call the clinic.   BELOW ARE SYMPTOMS THAT SHOULD BE REPORTED IMMEDIATELY:  *FEVER GREATER THAN 100.5 F  *CHILLS WITH OR WITHOUT FEVER  NAUSEA AND VOMITING THAT IS NOT CONTROLLED WITH YOUR NAUSEA MEDICATION  *UNUSUAL SHORTNESS OF BREATH  *UNUSUAL BRUISING OR BLEEDING  TENDERNESS IN MOUTH AND THROAT WITH OR WITHOUT PRESENCE OF ULCERS  *URINARY PROBLEMS  *BOWEL PROBLEMS  UNUSUAL RASH Items with * indicate a potential emergency and should be followed up as soon as possible.  Feel free to call the clinic should you have any questions or concerns. The clinic phone number is [phone].  Please show the CHEMO ALERT CARD at check-in to the Emergency Department and triage nurse.    

## 2021-03-15 NOTE — Telephone Encounter (Signed)
R/s per prov pal, per 4/20 los, pt aware.

## 2021-03-16 ENCOUNTER — Encounter: Payer: Self-pay | Admitting: Hematology and Oncology

## 2021-03-16 NOTE — Assessment & Plan Note (Signed)
There were recent changes to her blood pressure medications Her blood pressure is typically normal at home but elevated today likely due to anxiety Observe closely for now

## 2021-03-16 NOTE — Progress Notes (Signed)
HEMATOLOGY-ONCOLOGY ELECTRONIC VISIT PROGRESS NOTE  Patient Care Team: Buzzy Han, MD as PCP - General (Family Medicine)  I connected with  the patient and her son through electronic visit for treatment today  ASSESSMENT & PLAN:  Endometrial cancer [facility]) Her last imaging studies showed excellent response to therapy We discussed the risk and benefits of continuing treatment She is in agreement to proceed We will continue the interval of pembrolizumab of every 6 weeks I plan to repeat imaging study again in 6 months, due around October  Essential hypertension There were recent changes to her blood pressure medications Her blood pressure is typically normal at home but elevated today likely due to anxiety Observe closely for now  Preventive measure We discussed the timing of her booster injection I recommend her to delay for another 2 months   No orders of the defined types were placed in this encounter.   INTERVAL HISTORY: Please see below for problem oriented charting. She returns with her son for further follow-up and treatment She has been doing well No infusion reactions Denies abdominal pain or changes in bowel habits She has questions regarding the fourth injection/second booster shot against COVID-19 Her documented blood pressure from home were usually within normal range  SUMMARY OF ONCOLOGIC HISTORY: Oncology History Overview Note  MMR - Abnormal   Endometrial cancer ([place])  06/01/2013 Initial Diagnosis   Endometrial cancer   06/01/2013 Initial Biopsy   EMB for AUB Endometrium, biopsy - POSITIVE FOR ENDOMETRIAL ADENOCARCINOMA, SEE COMMENT. Microscopic Comment As sampled the endometrial adenocarcinoma appears to be endometrioid type, FIGO Grade I.   07/21/2013 Surgery   PREOPERATIVE DIAGNOSIS: Grade 1 endometrial carcinoma. Uterine fibroids    PROCEDURE: Total abdominal hysterectomy, bilateral salpingo-oophorectomy, pelvic lymphadenectomy    SURGEON: [name], M.D  SURGICAL FINDINGS: At the time of exploratory laparotomy the uterus was approximately 16-18 weeks size with multiple subserosal fibroids. Tubes and ovaries were normal. On frozen section patient had endometrial carcinoma invading only the inner half of the myometrium. Exploration the upper abdomen revealed no evidence of metastatic disease. There was no pelvic or para-aortic lymphadenopathy.     07/21/2013 Pathologic Stage   1. Uterus +/- tubes/ovaries, neoplastic - INVASIVE ENDOMETRIOID CARCINOMA, FIGO GRADE II, WITH SQUAMOUS DIFFERENTIATION, CONFINED WITHIN INNER HALF OF THE MYOMETRIUM. - LEIOMYOMATA AND ADENOMYOSIS. - CERVIX: BENIGN SQUAMOUS MUCOSA AND ENDOCERVICAL MUCOSA, NO DYSPLASIA OR MALIGNANCY. - BILATERAL OVARIES AND FALLOPIAN TUBES: NO PATHOLOGIC ABNORMALITIES. 2. Lymph nodes, regional resection, left pelvic - FOUR LYMPH NODES, NO EVIDENCE OF METASTATIC CARCINOMA (0/4). 3. Lymph nodes, regional resection, right pelvic - THREE LYMPH NODES, NEGATIVE FOR METASTATIC CARCINOMA (0/3). Microscopic Comment 1. ONCOLOGY TABLE - UTERUS, CARCINOMA OR CARCINOSARCOMA Specimen: Uterus, cervix, bilateral ovaries and fallopian tubes Procedure: Total hysterectomy and bilateral salpingo-oophorectomy Lymph node sampling performed: Yes Specimen integrity: Intact Maximum tumor size (cm): 2.4 cm, gross measurement Histologic type: Invasive endometrioid carcinoma with squamous differentiation Grade: FIGO II Myometrial invasion: 1.1 cm where myometrium is 3.7 cm in thickness Cervical stromal involvement: Not identified Extent of involvement of other organs: No Lymph vascular invasion: Not identified Peritoneal washings: Negative (Please correlate with [id number]) Lymph nodes: number examined 7; number positive 0 Pelvic lymph nodes: 0 involved of 7 lymph nodes Para-aortic lymph nodes: N/A TNM code: pT1a, pN0 FIGO Stage (based on pathologic findings, needs  clinical correlation): IA   09/22/2019 Imaging   CT A/P: There is a left paraspinous mass which is poorly defined and low-density measuring 3 x 2.2 by 5.5 cm.  Deviates the left ureter laterally. No additional periaortic masses are identified.   10/13/2019 Pathology Results   A. LYMPH NODE, LEFT PARASPINAL, NEEDLE CORE BIOPSY: - Poorly differentiated carcinoma. COMMENT: Poorly differentiated carcinoma is present in the background of a majority of necrosis and fibrosis tissue. No distinct nodal tissue is identified. Immunohistochemistry for CK7, PAX 8 and ER is positive. CK20, TTF-1, CDX-2 and GATA-3 are negative. The immunophenotype is compatible with the provided clinical history of gynecologic cancer.   10/19/2019 Cancer Staging   Staging form: Corpus Uteri - Carcinoma and Carcinosarcoma, AJCC 8th Edition - Pathologic: Stage IIIC2 (pT1a, pN2a, cM0) - Signed by [name], MD on 10/29/2019    Genetic Testing   Patient has genetic testing done for MMR. Results revealed patient has the following mutation(s): MMR - Abnormal   10/27/2019 PET scan   1. Poorly marginated hypermetabolic nodal conglomerate in the left para-aortic and left common iliac chains, compatible with nodal metastatic disease. 2. No additional hypermetabolic sites of metastatic disease. 3. Right upper lobe 2 mm solid pulmonary nodule, below PET resolution, recommend attention on follow-up chest CT in 3-6 months. 4. Chronic findings include: Aortic Atherosclerosis (ICD10-I70.0). Coronary atherosclerosis. Mild left colonic diverticulosis. Mild biliary and pancreatic duct dilation of uncertain etiology, unchanged since 09/22/2019 CT.   11/10/2019 -  Chemotherapy    Patient is on Treatment Plan: UTERINE PEMBROLIZUMAB Q42D      02/22/2020 PET scan   1. Significant partial metabolic response. Left para-aortic and left common iliac adenopathy is decreased in size and significantly decreased in metabolism. 2. No new or progressive  hypermetabolic metastatic disease. 3. Aortic Atherosclerosis (ICD10-I70.0). Additional chronic findings as detailed.   05/16/2020 Imaging   Further decrease in mild left paraaortic and left common iliac lymphadenopathy.   No new or progressive disease within the abdomen or pelvis.   Aortic Atherosclerosis (ICD10-I70.0).     09/19/2020 Imaging   1. Mild response to therapy as evidenced by decrease in abdominopelvic adenopathy. 2. No new sites of disease. 3. Hysterectomy. 4.  Aortic Atherosclerosis (ICD10-I70.0).     01/31/2021 Imaging   1. Stable exam. Unchanged appearance borderline abdominopelvic adenopathy. 2. No new sites of disease. 3. Status post hysterectomy with stable, mild soft tissue thickening along the right side of vaginal cuff. 4.  Aortic Atherosclerosis (ICD10-I70.0).     REVIEW OF SYSTEMS:   Constitutional: Denies fevers, chills or abnormal weight loss Eyes: Denies blurriness of vision Ears, nose, mouth, throat, and face: Denies mucositis or sore throat Respiratory: Denies cough, dyspnea or wheezes Cardiovascular: Denies palpitation, chest discomfort Gastrointestinal:  Denies nausea, heartburn or change in bowel habits Skin: Denies abnormal skin rashes Lymphatics: Denies new lymphadenopathy or easy bruising Neurological:Denies numbness, tingling or new weaknesses Behavioral/Psych: Mood is stable, no new changes  Extremities: No lower extremity edema All other systems were reviewed with the patient and are negative.  I have reviewed the past medical history, past surgical history, social history and family history with the patient and they are unchanged from previous note.  ALLERGIES:  is allergic to iron, iron dextran, penicillins, amoxicillin, ampicillin, codeine, diamox [acetazolamide], penicillin g, and other.  MEDICATIONS:  Current Outpatient Medications  Medication Sig Dispense Refill  . amLODipine (NORVASC) 10 MG tablet Take by mouth.    Marland Kitchen  atorvastatin (LIPITOR) 20 MG tablet Take 1 tablet (20 mg total) by mouth daily. 30 tablet 3  . benazepril (LOTENSIN) 20 MG tablet Take 20 mg by mouth daily.    . brimonidine (ALPHAGAN)  0.2 % ophthalmic solution     . busPIRone (BUSPAR) 7.5 MG tablet Take 7.5 mg by mouth 2 (two) times daily.    . dorzolamide-timolol (COSOPT) 22.3-6.8 MG/ML ophthalmic solution Place 1 drop into the left eye 2 (two) times daily.    . hydrochlorothiazide (HYDRODIURIL) 25 MG tablet Take 1 tablet (25 mg total) by mouth daily. Discontinue amlodipine 90 tablet 1  . latanoprost (XALATAN) 0.005 % ophthalmic solution Apply to eye.    . magnesium 30 MG tablet Take by mouth daily.    . metoprolol tartrate (LOPRESSOR) 25 MG tablet Take 1 tablet (25 mg total) by mouth 2 (two) times daily. 180 tablet 1  . metoprolol tartrate (LOPRESSOR) 25 MG tablet 25 mg.    . Netarsudil Dimesylate 0.02 % SOLN Apply to eye.    . ondansetron (ZOFRAN) 8 MG tablet Take 1 tablet (8 mg total) by mouth every 8 (eight) hours as needed for nausea. 30 tablet 3  . prednisoLONE acetate (PRED FORTE) 1 % ophthalmic suspension Apply to eye.    . prochlorperazine (COMPAZINE) 10 MG tablet Take 1 tablet (10 mg total) by mouth every 6 (six) hours as needed for nausea or vomiting. 30 tablet 0  . VYZULTA 0.024 % SOLN      No current facility-administered medications for this visit.    PHYSICAL EXAMINATION: ECOG PERFORMANCE STATUS: 0 - Asymptomatic  LABORATORY DATA:  I have reviewed the data as listed CMP Latest Ref Rng & Units 03/15/2021 01/31/2021 12/21/2020  Glucose 70 - 99 mg/dL 101(H) 94 127(H)  BUN 8 - 23 mg/dL '11 15 20  ' Creatinine 0.44 - 1.00 mg/dL 0.77 0.92 0.93  Sodium 135 - 145 mmol/L 144 143 142  Potassium 3.5 - 5.1 mmol/L 3.6 3.4(L) 3.5  Chloride 98 - 111 mmol/L 107 106 106  CO2 22 - 32 mmol/L '24 26 25  ' Calcium 8.9 - 10.3 mg/dL 9.8 9.4 9.9  Total Protein 6.5 - 8.1 g/dL 8.6(H) 8.3(H) 8.6(H)  Total Bilirubin 0.3 - 1.2 mg/dL 1.0 0.7 0.8  Alkaline  Phos 38 - 126 U/L 93 113 122  AST 15 - 41 U/L '16 17 19  ' ALT 0 - 44 U/L '12 13 22    ' Lab Results  Component Value Date   WBC 5.1 03/15/2021   HGB 15.0 03/15/2021   HCT 42.1 03/15/2021   MCV 88.1 03/15/2021   PLT 171 03/15/2021   NEUTROABS 3.1 03/15/2021     I discussed the assessment and treatment plan with the patient. The patient was provided an opportunity to ask questions and all were answered. The patient agreed with the plan and demonstrated an understanding of the instructions. The patient was advised to call back or seek an in-person evaluation if the symptoms worsen or if the condition fails to improve as anticipated.    I spent 20 minutes for the appointment reviewing test results, discuss management and coordination of care.  [name], MD 03/16/2021 8:32 AM

## 2021-03-16 NOTE — Assessment & Plan Note (Signed)
Her last imaging studies showed excellent response to therapy We discussed the risk and benefits of continuing treatment She is in agreement to proceed We will continue the interval of pembrolizumab of every 6 weeks I plan to repeat imaging study again in 6 months, due around October

## 2021-03-16 NOTE — Assessment & Plan Note (Signed)
We discussed the timing of her booster injection I recommend her to delay for another 2 months

## 2021-04-26 ENCOUNTER — Ambulatory Visit: Payer: Medicare Other

## 2021-04-26 ENCOUNTER — Other Ambulatory Visit: Payer: Medicare Other

## 2021-04-26 ENCOUNTER — Ambulatory Visit: Payer: Medicare Other | Admitting: Hematology and Oncology

## 2021-04-27 ENCOUNTER — Inpatient Hospital Stay: Payer: Medicare Other

## 2021-04-27 ENCOUNTER — Encounter: Payer: Self-pay | Admitting: Hematology and Oncology

## 2021-04-27 ENCOUNTER — Inpatient Hospital Stay (HOSPITAL_BASED_OUTPATIENT_CLINIC_OR_DEPARTMENT_OTHER): Payer: Medicare Other | Admitting: Hematology and Oncology

## 2021-04-27 ENCOUNTER — Inpatient Hospital Stay: Payer: Medicare Other | Attending: Gynecologic Oncology

## 2021-04-27 ENCOUNTER — Other Ambulatory Visit: Payer: Self-pay

## 2021-04-27 VITALS — BP 166/90 | HR 94 | Temp 97.8°F | Resp 18 | Ht 66.0 in | Wt 158.8 lb

## 2021-04-27 DIAGNOSIS — Z7189 Other specified counseling: Secondary | ICD-10-CM

## 2021-04-27 DIAGNOSIS — Z5112 Encounter for antineoplastic immunotherapy: Secondary | ICD-10-CM | POA: Diagnosis present

## 2021-04-27 DIAGNOSIS — C541 Malignant neoplasm of endometrium: Secondary | ICD-10-CM | POA: Diagnosis present

## 2021-04-27 DIAGNOSIS — I10 Essential (primary) hypertension: Secondary | ICD-10-CM | POA: Insufficient documentation

## 2021-04-27 DIAGNOSIS — Z79899 Other long term (current) drug therapy: Secondary | ICD-10-CM | POA: Insufficient documentation

## 2021-04-27 LAB — CBC WITH DIFFERENTIAL (CANCER CENTER ONLY)
Abs Immature Granulocytes: 0.01 10*3/uL (ref 0.00–0.07)
Basophils Absolute: 0 10*3/uL (ref 0.0–0.1)
Basophils Relative: 1 %
Eosinophils Absolute: 0.1 10*3/uL (ref 0.0–0.5)
Eosinophils Relative: 3 %
HCT: 39.5 % (ref 36.0–46.0)
Hemoglobin: 13.9 g/dL (ref 12.0–15.0)
Immature Granulocytes: 0 %
Lymphocytes Relative: 23 %
Lymphs Abs: 1 10*3/uL (ref 0.7–4.0)
MCH: 31.1 pg (ref 26.0–34.0)
MCHC: 35.2 g/dL (ref 30.0–36.0)
MCV: 88.4 fL (ref 80.0–100.0)
Monocytes Absolute: 0.4 10*3/uL (ref 0.1–1.0)
Monocytes Relative: 9 %
Neutro Abs: 3 10*3/uL (ref 1.7–7.7)
Neutrophils Relative %: 64 %
Platelet Count: 163 10*3/uL (ref 150–400)
RBC: 4.47 MIL/uL (ref 3.87–5.11)
RDW: 13.2 % (ref 11.5–15.5)
WBC Count: 4.6 10*3/uL (ref 4.0–10.5)
nRBC: 0 % (ref 0.0–0.2)

## 2021-04-27 LAB — CMP (CANCER CENTER ONLY)
ALT: 12 U/L (ref 0–44)
AST: 18 U/L (ref 15–41)
Albumin: 4 g/dL (ref 3.5–5.0)
Alkaline Phosphatase: 111 U/L (ref 38–126)
Anion gap: 10 (ref 5–15)
BUN: 11 mg/dL (ref 8–23)
CO2: 24 mmol/L (ref 22–32)
Calcium: 9.3 mg/dL (ref 8.9–10.3)
Chloride: 108 mmol/L (ref 98–111)
Creatinine: 0.77 mg/dL (ref 0.44–1.00)
GFR, Estimated: >60 mL/min (ref >60)
Glucose, Bld: 107 mg/dL — ABNORMAL HIGH (ref 70–99)
Potassium: 3.9 mmol/L (ref 3.5–5.1)
Sodium: 142 mmol/L (ref 135–145)
Total Bilirubin: 0.8 mg/dL (ref 0.3–1.2)
Total Protein: 8.1 g/dL (ref 6.5–8.1)

## 2021-04-27 LAB — TSH: TSH: 1.538 u[IU]/mL (ref 0.308–3.960)

## 2021-04-27 MED ORDER — SODIUM CHLORIDE 0.9 % IV SOLN
400.0000 mg | Freq: Once | INTRAVENOUS | Status: AC
  Administered 2021-04-27: 400 mg via INTRAVENOUS
  Filled 2021-04-27: qty 16

## 2021-04-27 MED ORDER — SODIUM CHLORIDE 0.9 % IV SOLN
Freq: Once | INTRAVENOUS | Status: DC
  Filled 2021-04-27: qty 250

## 2021-04-27 NOTE — Progress Notes (Signed)
[place] OFFICE PROGRESS NOTE  Patient Care Team: Buzzy Han, MD as PCP - General (Family Medicine)  ASSESSMENT & PLAN:  Endometrial cancer [facility]) Her last imaging studies showed excellent response to therapy We discussed the risk and benefits of continuing treatment She is in agreement to proceed We will continue the interval of pembrolizumab of every 6 weeks I plan to repeat imaging study again in 6 months, due around October  Essential hypertension There were recent changes to her blood pressure medications Her blood pressure is typically normal at home but elevated today likely due to anxiety Observe closely for now  Orders Placed This Encounter  Procedures   CT ABDOMEN PELVIS W CONTRAST    Standing Status:   Future    Standing Expiration Date:   04/27/2022    Order Specific Question:   If indicated for the ordered procedure, I authorize the administration of contrast media per Radiology protocol    Answer:   Yes    Order Specific Question:   Preferred imaging location?    Answer:   [facility]    Order Specific Question:   Radiology Contrast Protocol - do NOT remove file path    Answer:   [email or web address]    All questions were answered. The patient knows to call the clinic with any problems, questions or concerns. The total time spent in the appointment was 20 minutes encounter with patients including review of chart and various tests results, discussions about plan of care and coordination of care plan   [name], MD 04/27/2021 12:02 PM  INTERVAL HISTORY: Please see below for problem oriented charting. She returns for treatment follow-up She is doing well No side effects from treatment so far Denies abdominal pain, nausea, bloating or changes in bowel habits She has no symptoms from her high blood pressure today  SUMMARY OF ONCOLOGIC HISTORY: Oncology History Overview Note  MMR -  Abnormal   Endometrial cancer ([place])  06/01/2013 Initial Diagnosis   Endometrial cancer    06/01/2013 Initial Biopsy   EMB for AUB Endometrium, biopsy - POSITIVE FOR ENDOMETRIAL ADENOCARCINOMA, SEE COMMENT. Microscopic Comment As sampled the endometrial adenocarcinoma appears to be endometrioid type, FIGO Grade I.   07/21/2013 Surgery   PREOPERATIVE DIAGNOSIS: Grade 1 endometrial carcinoma. Uterine fibroids    PROCEDURE: Total abdominal hysterectomy, bilateral salpingo-oophorectomy, pelvic lymphadenectomy   SURGEON: [name], M.D  SURGICAL FINDINGS: At the time of exploratory laparotomy the uterus was approximately 16-18 weeks size with multiple subserosal fibroids. Tubes and ovaries were normal. On frozen section patient had endometrial carcinoma invading only the inner half of the myometrium. Exploration the upper abdomen revealed no evidence of metastatic disease. There was no pelvic or para-aortic lymphadenopathy.     07/21/2013 Pathologic Stage   1. Uterus +/- tubes/ovaries, neoplastic - INVASIVE ENDOMETRIOID CARCINOMA, FIGO GRADE II, WITH SQUAMOUS DIFFERENTIATION, CONFINED WITHIN INNER HALF OF THE MYOMETRIUM. - LEIOMYOMATA AND ADENOMYOSIS. - CERVIX: BENIGN SQUAMOUS MUCOSA AND ENDOCERVICAL MUCOSA, NO DYSPLASIA OR MALIGNANCY. - BILATERAL OVARIES AND FALLOPIAN TUBES: NO PATHOLOGIC ABNORMALITIES. 2. Lymph nodes, regional resection, left pelvic - FOUR LYMPH NODES, NO EVIDENCE OF METASTATIC CARCINOMA (0/4). 3. Lymph nodes, regional resection, right pelvic - THREE LYMPH NODES, NEGATIVE FOR METASTATIC CARCINOMA (0/3). Microscopic Comment 1. ONCOLOGY TABLE - UTERUS, CARCINOMA OR CARCINOSARCOMA Specimen: Uterus, cervix, bilateral ovaries and fallopian tubes Procedure: Total hysterectomy and bilateral salpingo-oophorectomy Lymph node sampling performed: Yes Specimen integrity: Intact Maximum tumor size (cm): 2.4 cm, gross measurement Histologic type: Invasive  endometrioid  carcinoma with squamous differentiation Grade: FIGO II Myometrial invasion: 1.1 cm where myometrium is 3.7 cm in thickness Cervical stromal involvement: Not identified Extent of involvement of other organs: No Lymph vascular invasion: Not identified Peritoneal washings: Negative (Please correlate with [id number]) Lymph nodes: number examined 7; number positive 0 Pelvic lymph nodes: 0 involved of 7 lymph nodes Para-aortic lymph nodes: N/A TNM code: pT1a, pN0 FIGO Stage (based on pathologic findings, needs clinical correlation): IA   09/22/2019 Imaging   CT A/P: There is a left paraspinous mass which is poorly defined and low-density measuring 3 x 2.2 by 5.5 cm. Deviates the left ureter laterally. No additional periaortic masses are identified.   10/13/2019 Pathology Results   A. LYMPH NODE, LEFT PARASPINAL, NEEDLE CORE BIOPSY: - Poorly differentiated carcinoma. COMMENT: Poorly differentiated carcinoma is present in the background of a majority of necrosis and fibrosis tissue. No distinct nodal tissue is identified. Immunohistochemistry for CK7, PAX 8 and ER is positive. CK20, TTF-1, CDX-2 and GATA-3 are negative. The immunophenotype is compatible with the provided clinical history of gynecologic cancer.   10/19/2019 Cancer Staging   Staging form: Corpus Uteri - Carcinoma and Carcinosarcoma, AJCC 8th Edition - Pathologic: Stage IIIC2 (pT1a, pN2a, cM0) - Signed by [name], MD on 10/29/2019     Genetic Testing   Patient has genetic testing done for MMR. Results revealed patient has the following mutation(s): MMR - Abnormal   10/27/2019 PET scan   1. Poorly marginated hypermetabolic nodal conglomerate in the left para-aortic and left common iliac chains, compatible with nodal metastatic disease. 2. No additional hypermetabolic sites of metastatic disease. 3. Right upper lobe 2 mm solid pulmonary nodule, below PET resolution, recommend attention on follow-up chest CT in 3-6 months. 4.  Chronic findings include: Aortic Atherosclerosis (ICD10-I70.0). Coronary atherosclerosis. Mild left colonic diverticulosis. Mild biliary and pancreatic duct dilation of uncertain etiology, unchanged since 09/22/2019 CT.   11/10/2019 -  Chemotherapy    Patient is on Treatment Plan: UTERINE PEMBROLIZUMAB Q42D       02/22/2020 PET scan   1. Significant partial metabolic response. Left para-aortic and left common iliac adenopathy is decreased in size and significantly decreased in metabolism. 2. No new or progressive hypermetabolic metastatic disease. 3. Aortic Atherosclerosis (ICD10-I70.0). Additional chronic findings as detailed.   05/16/2020 Imaging   Further decrease in mild left paraaortic and left common iliac lymphadenopathy.   No new or progressive disease within the abdomen or pelvis.   Aortic Atherosclerosis (ICD10-I70.0).     09/19/2020 Imaging   1. Mild response to therapy as evidenced by decrease in abdominopelvic adenopathy. 2. No new sites of disease. 3. Hysterectomy. 4.  Aortic Atherosclerosis (ICD10-I70.0).     01/31/2021 Imaging   1. Stable exam. Unchanged appearance borderline abdominopelvic adenopathy. 2. No new sites of disease. 3. Status post hysterectomy with stable, mild soft tissue thickening along the right side of vaginal cuff. 4.  Aortic Atherosclerosis (ICD10-I70.0).     REVIEW OF SYSTEMS:   Constitutional: Denies fevers, chills or abnormal weight loss Eyes: Denies blurriness of vision Ears, nose, mouth, throat, and face: Denies mucositis or sore throat Respiratory: Denies cough, dyspnea or wheezes Cardiovascular: Denies palpitation, chest discomfort or lower extremity swelling Gastrointestinal:  Denies nausea, heartburn or change in bowel habits Skin: Denies abnormal skin rashes Lymphatics: Denies new lymphadenopathy or easy bruising Neurological:Denies numbness, tingling or new weaknesses Behavioral/Psych: Mood is stable, no new changes  All other  systems were reviewed with the patient and are  negative.  I have reviewed the past medical history, past surgical history, social history and family history with the patient and they are unchanged from previous note.  ALLERGIES:  is allergic to iron, iron dextran, penicillins, amoxicillin, ampicillin, codeine, diamox [acetazolamide], penicillin g, and other.  MEDICATIONS:  Current Outpatient Medications  Medication Sig Dispense Refill   amLODipine (NORVASC) 10 MG tablet Take by mouth.     atorvastatin (LIPITOR) 20 MG tablet Take 1 tablet (20 mg total) by mouth daily. 30 tablet 3   benazepril (LOTENSIN) 20 MG tablet Take 20 mg by mouth daily.     brimonidine (ALPHAGAN) 0.2 % ophthalmic solution      busPIRone (BUSPAR) 7.5 MG tablet Take 7.5 mg by mouth 2 (two) times daily.     dorzolamide-timolol (COSOPT) 22.3-6.8 MG/ML ophthalmic solution Place 1 drop into the left eye 2 (two) times daily.     hydrochlorothiazide (HYDRODIURIL) 25 MG tablet Take 1 tablet (25 mg total) by mouth daily. Discontinue amlodipine 90 tablet 1   latanoprost (XALATAN) 0.005 % ophthalmic solution Apply to eye.     magnesium 30 MG tablet Take by mouth daily.     metoprolol tartrate (LOPRESSOR) 25 MG tablet Take 1 tablet (25 mg total) by mouth 2 (two) times daily. 180 tablet 1   metoprolol tartrate (LOPRESSOR) 25 MG tablet 25 mg.     Netarsudil Dimesylate 0.02 % SOLN Apply to eye.     ondansetron (ZOFRAN) 8 MG tablet Take 1 tablet (8 mg total) by mouth every 8 (eight) hours as needed for nausea. 30 tablet 3   prednisoLONE acetate (PRED FORTE) 1 % ophthalmic suspension Apply to eye.     prochlorperazine (COMPAZINE) 10 MG tablet Take 1 tablet (10 mg total) by mouth every 6 (six) hours as needed for nausea or vomiting. 30 tablet 0   VYZULTA 0.024 % SOLN      No current facility-administered medications for this visit.    PHYSICAL EXAMINATION: ECOG PERFORMANCE STATUS: 0 - Asymptomatic  Vitals:   04/27/21 1149  BP:  (!) 166/90  Pulse: 94  Resp: 18  Temp: 97.8 F (36.6 C)  SpO2: 98%   Filed Weights   04/27/21 1149  Weight: 158 lb 12.8 oz (72 kg)    GENERAL:alert, no distress and comfortable SKIN: skin color, texture, turgor are normal, no rashes or significant lesions EYES: normal, Conjunctiva are pink and non-injected, sclera clear OROPHARYNX:no exudate, no erythema and lips, buccal mucosa, and tongue normal  NECK: supple, thyroid normal size, non-tender, without nodularity LYMPH:  no palpable lymphadenopathy in the cervical, axillary or inguinal LUNGS: clear to auscultation and percussion with normal breathing effort HEART: regular rate & rhythm and no murmurs and no lower extremity edema ABDOMEN:abdomen soft, non-tender and normal bowel sounds Musculoskeletal:no cyanosis of digits and no clubbing  NEURO: alert & oriented x 3 with fluent speech, no focal motor/sensory deficits  LABORATORY DATA:  I have reviewed the data as listed    Component Value Date/Time   NA 144 03/15/2021 1121   NA 139 03/03/2019 0934   NA 142 07/09/2013 1144   K 3.6 03/15/2021 1121   K 3.7 07/09/2013 1144   CL 107 03/15/2021 1121   CO2 24 03/15/2021 1121   CO2 23 07/09/2013 1144   GLUCOSE 101 (H) 03/15/2021 1121   GLUCOSE 115 07/09/2013 1144   BUN 11 03/15/2021 1121   BUN 25 03/03/2019 0934   BUN 14.4 07/09/2013 1144   CREATININE 0.77 03/15/2021 1121  CREATININE 0.82 01/02/2017 1159   CREATININE 0.8 07/09/2013 1144   CALCIUM 9.8 03/15/2021 1121   CALCIUM 9.3 07/09/2013 1144   PROT 8.6 (H) 03/15/2021 1121   PROT 8.0 03/03/2019 0934   PROT 7.8 07/09/2013 1144   ALBUMIN 4.4 03/15/2021 1121   ALBUMIN 4.7 03/03/2019 0934   ALBUMIN 3.7 07/09/2013 1144   AST 16 03/15/2021 1121   AST 9 07/09/2013 1144   ALT 12 03/15/2021 1121   ALT <6 07/09/2013 1144   ALKPHOS 93 03/15/2021 1121   ALKPHOS 58 07/09/2013 1144   BILITOT 1.0 03/15/2021 1121   BILITOT 0.40 07/09/2013 1144   GFRNONAA >60 03/15/2021 1121    GFRNONAA 74 02/01/2015 1238   GFRAA >60 06/28/2020 1031   GFRAA 85 02/01/2015 1238    No results found for: SPEP, UPEP  Lab Results  Component Value Date   WBC 4.6 04/27/2021   NEUTROABS 3.0 04/27/2021   HGB 13.9 04/27/2021   HCT 39.5 04/27/2021   MCV 88.4 04/27/2021   PLT 163 04/27/2021      Chemistry      Component Value Date/Time   NA 144 03/15/2021 1121   NA 139 03/03/2019 0934   NA 142 07/09/2013 1144   K 3.6 03/15/2021 1121   K 3.7 07/09/2013 1144   CL 107 03/15/2021 1121   CO2 24 03/15/2021 1121   CO2 23 07/09/2013 1144   BUN 11 03/15/2021 1121   BUN 25 03/03/2019 0934   BUN 14.4 07/09/2013 1144   CREATININE 0.77 03/15/2021 1121   CREATININE 0.82 01/02/2017 1159   CREATININE 0.8 07/09/2013 1144      Component Value Date/Time   CALCIUM 9.8 03/15/2021 1121   CALCIUM 9.3 07/09/2013 1144   ALKPHOS 93 03/15/2021 1121   ALKPHOS 58 07/09/2013 1144   AST 16 03/15/2021 1121   AST 9 07/09/2013 1144   ALT 12 03/15/2021 1121   ALT <6 07/09/2013 1144   BILITOT 1.0 03/15/2021 1121   BILITOT 0.40 07/09/2013 1144

## 2021-04-27 NOTE — Assessment & Plan Note (Signed)
There were recent changes to her blood pressure medications Her blood pressure is typically normal at home but elevated today likely due to anxiety Observe closely for now

## 2021-04-27 NOTE — Assessment & Plan Note (Signed)
Her last imaging studies showed excellent response to therapy We discussed the risk and benefits of continuing treatment She is in agreement to proceed We will continue the interval of pembrolizumab of every 6 weeks I plan to repeat imaging study again in 6 months, due around October

## 2021-06-08 ENCOUNTER — Inpatient Hospital Stay: Payer: Medicare Other | Attending: Gynecologic Oncology

## 2021-06-08 ENCOUNTER — Other Ambulatory Visit: Payer: Self-pay

## 2021-06-08 ENCOUNTER — Inpatient Hospital Stay: Payer: Medicare Other

## 2021-06-08 VITALS — BP 171/90 | HR 85 | Temp 98.0°F | Resp 18 | Ht 66.0 in | Wt 157.8 lb

## 2021-06-08 DIAGNOSIS — Z7189 Other specified counseling: Secondary | ICD-10-CM

## 2021-06-08 DIAGNOSIS — C541 Malignant neoplasm of endometrium: Secondary | ICD-10-CM

## 2021-06-08 DIAGNOSIS — Z79899 Other long term (current) drug therapy: Secondary | ICD-10-CM | POA: Insufficient documentation

## 2021-06-08 DIAGNOSIS — Z5112 Encounter for antineoplastic immunotherapy: Secondary | ICD-10-CM | POA: Insufficient documentation

## 2021-06-08 LAB — CBC WITH DIFFERENTIAL (CANCER CENTER ONLY)
Abs Immature Granulocytes: 0.02 10*3/uL (ref 0.00–0.07)
Basophils Absolute: 0 10*3/uL (ref 0.0–0.1)
Basophils Relative: 1 %
Eosinophils Absolute: 0.1 10*3/uL (ref 0.0–0.5)
Eosinophils Relative: 2 %
HCT: 37.8 % (ref 36.0–46.0)
Hemoglobin: 13.8 g/dL (ref 12.0–15.0)
Immature Granulocytes: 0 %
Lymphocytes Relative: 24 %
Lymphs Abs: 1.4 10*3/uL (ref 0.7–4.0)
MCH: 31.6 pg (ref 26.0–34.0)
MCHC: 36.5 g/dL — ABNORMAL HIGH (ref 30.0–36.0)
MCV: 86.5 fL (ref 80.0–100.0)
Monocytes Absolute: 0.5 10*3/uL (ref 0.1–1.0)
Monocytes Relative: 8 %
Neutro Abs: 3.9 10*3/uL (ref 1.7–7.7)
Neutrophils Relative %: 65 %
Platelet Count: 163 10*3/uL (ref 150–400)
RBC: 4.37 MIL/uL (ref 3.87–5.11)
RDW: 13 % (ref 11.5–15.5)
WBC Count: 5.9 10*3/uL (ref 4.0–10.5)
nRBC: 0 % (ref 0.0–0.2)

## 2021-06-08 LAB — CMP (CANCER CENTER ONLY)
ALT: 13 U/L (ref 0–44)
AST: 16 U/L (ref 15–41)
Albumin: 4.4 g/dL (ref 3.5–5.0)
Alkaline Phosphatase: 113 U/L (ref 38–126)
Anion gap: 9 (ref 5–15)
BUN: 18 mg/dL (ref 8–23)
CO2: 24 mmol/L (ref 22–32)
Calcium: 9.5 mg/dL (ref 8.9–10.3)
Chloride: 107 mmol/L (ref 98–111)
Creatinine: 0.79 mg/dL (ref 0.44–1.00)
GFR, Estimated: >60 mL/min (ref >60)
Glucose, Bld: 91 mg/dL (ref 70–99)
Potassium: 3.1 mmol/L — ABNORMAL LOW (ref 3.5–5.1)
Sodium: 140 mmol/L (ref 135–145)
Total Bilirubin: 1 mg/dL (ref 0.3–1.2)
Total Protein: 8.1 g/dL (ref 6.5–8.1)

## 2021-06-08 LAB — TSH: TSH: 1.484 u[IU]/mL (ref 0.308–3.960)

## 2021-06-08 MED ORDER — SODIUM CHLORIDE 0.9 % IV SOLN
Freq: Once | INTRAVENOUS | Status: AC
Start: 2021-06-08 — End: 2021-06-08

## 2021-06-08 MED ORDER — SODIUM CHLORIDE 0.9 % IV SOLN
400.0000 mg | Freq: Once | INTRAVENOUS | Status: AC
  Administered 2021-06-08: 400 mg via INTRAVENOUS
  Filled 2021-06-08: qty 16

## 2021-06-08 NOTE — Patient Instructions (Signed)
[facility] MEDICAL ONCOLOGY  Discharge Instructions: Thank you for choosing [facility] to provide your oncology and hematology care.   If you have a lab appointment with the Cancer Center, please go directly to the Cancer Center and check in at the registration area.   Wear comfortable clothing and clothing appropriate for easy access to any Portacath or PICC line.   We strive to give you quality time with your provider. You may need to reschedule your appointment if you arrive late (15 or more minutes).  Arriving late affects you and other patients whose appointments are after yours.  Also, if you miss three or more appointments without notifying the office, you may be dismissed from the clinic at the provider's discretion.      For prescription refill requests, have your pharmacy contact our office and allow 72 hours for refills to be completed.    Today you received the following chemotherapy and/or immunotherapy agents: keytruda      To help prevent nausea and vomiting after your treatment, we encourage you to take your nausea medication as directed.  BELOW ARE SYMPTOMS THAT SHOULD BE REPORTED IMMEDIATELY: *FEVER GREATER THAN 100.4 F (38 C) OR HIGHER *CHILLS OR SWEATING *NAUSEA AND VOMITING THAT IS NOT CONTROLLED WITH YOUR NAUSEA MEDICATION *UNUSUAL SHORTNESS OF BREATH *UNUSUAL BRUISING OR BLEEDING *URINARY PROBLEMS (pain or burning when urinating, or frequent urination) *BOWEL PROBLEMS (unusual diarrhea, constipation, pain near the anus) TENDERNESS IN MOUTH AND THROAT WITH OR WITHOUT PRESENCE OF ULCERS (sore throat, sores in mouth, or a toothache) UNUSUAL RASH, SWELLING OR PAIN  UNUSUAL VAGINAL DISCHARGE OR ITCHING   Items with * indicate a potential emergency and should be followed up as soon as possible or go to the Emergency Department if any problems should occur.  Please show the CHEMOTHERAPY ALERT CARD or IMMUNOTHERAPY ALERT CARD at check-in to  the Emergency Department and triage nurse.  Should you have questions after your visit or need to cancel or reschedule your appointment, please contact [facility]  Dept: [phone]  and follow the prompts.  Office hours are 8:00 a.m. to 4:30 p.m. Monday - Friday. Please note that voicemails left after 4:00 p.m. may not be returned until the following business day.  We are closed weekends and major holidays. You have access to a nurse at all times for urgent questions. Please call the main number to the clinic Dept: [phone] and follow the prompts.   For any non-urgent questions, you may also contact your provider using MyChart. We now offer e-Visits for anyone 18 and older to request care online for non-urgent symptoms. For details visit [email or web address].   Also download the MyChart app! Go to the app store, search "MyChart", open the app, select [facility], and log in with your MyChart username and password.  Due to Covid, a mask is required upon entering the hospital/clinic. If you do not have a mask, one will be given to you upon arrival. For doctor visits, patients may have 1 support person aged 18 or older with them. For treatment visits, patients cannot have anyone with them due to current Covid guidelines and our immunocompromised population.   

## 2021-07-18 ENCOUNTER — Inpatient Hospital Stay: Payer: Medicare Other | Attending: Gynecologic Oncology

## 2021-07-18 ENCOUNTER — Ambulatory Visit (HOSPITAL_COMMUNITY)
Admission: RE | Admit: 2021-07-18 | Discharge: 2021-07-18 | Disposition: A | Discharge status: Home / Self Care | Payer: Medicare Other | Source: Ambulatory Visit | Attending: Hematology and Oncology | Admitting: Hematology and Oncology

## 2021-07-18 ENCOUNTER — Other Ambulatory Visit: Payer: Self-pay

## 2021-07-18 DIAGNOSIS — Z8542 Personal history of malignant neoplasm of other parts of uterus: Secondary | ICD-10-CM | POA: Insufficient documentation

## 2021-07-18 DIAGNOSIS — Z7189 Other specified counseling: Secondary | ICD-10-CM

## 2021-07-18 DIAGNOSIS — C541 Malignant neoplasm of endometrium: Secondary | ICD-10-CM

## 2021-07-18 DIAGNOSIS — Z90710 Acquired absence of both cervix and uterus: Secondary | ICD-10-CM | POA: Diagnosis not present

## 2021-07-18 DIAGNOSIS — Z08 Encounter for follow-up examination after completed treatment for malignant neoplasm: Secondary | ICD-10-CM | POA: Diagnosis present

## 2021-07-18 LAB — CBC WITH DIFFERENTIAL (CANCER CENTER ONLY)
Abs Immature Granulocytes: 0.03 10*3/uL (ref 0.00–0.07)
Basophils Absolute: 0 10*3/uL (ref 0.0–0.1)
Basophils Relative: 1 %
Eosinophils Absolute: 0.1 10*3/uL (ref 0.0–0.5)
Eosinophils Relative: 2 %
HCT: 40.3 % (ref 36.0–46.0)
Hemoglobin: 14 g/dL (ref 12.0–15.0)
Immature Granulocytes: 1 %
Lymphocytes Relative: 25 %
Lymphs Abs: 1.4 10*3/uL (ref 0.7–4.0)
MCH: 30.6 pg (ref 26.0–34.0)
MCHC: 34.7 g/dL (ref 30.0–36.0)
MCV: 88 fL (ref 80.0–100.0)
Monocytes Absolute: 0.4 10*3/uL (ref 0.1–1.0)
Monocytes Relative: 7 %
Neutro Abs: 3.7 10*3/uL (ref 1.7–7.7)
Neutrophils Relative %: 64 %
Platelet Count: 163 10*3/uL (ref 150–400)
RBC: 4.58 MIL/uL (ref 3.87–5.11)
RDW: 12.8 % (ref 11.5–15.5)
WBC Count: 5.7 10*3/uL (ref 4.0–10.5)
nRBC: 0 % (ref 0.0–0.2)

## 2021-07-18 LAB — CMP (CANCER CENTER ONLY)
ALT: 12 U/L (ref 0–44)
AST: 17 U/L (ref 15–41)
Albumin: 4.3 g/dL (ref 3.5–5.0)
Alkaline Phosphatase: 114 U/L (ref 38–126)
Anion gap: 10 (ref 5–15)
BUN: 14 mg/dL (ref 8–23)
CO2: 24 mmol/L (ref 22–32)
Calcium: 9.7 mg/dL (ref 8.9–10.3)
Chloride: 107 mmol/L (ref 98–111)
Creatinine: 0.88 mg/dL (ref 0.44–1.00)
GFR, Estimated: >60 mL/min (ref >60)
Glucose, Bld: 102 mg/dL — ABNORMAL HIGH (ref 70–99)
Potassium: 4 mmol/L (ref 3.5–5.1)
Sodium: 141 mmol/L (ref 135–145)
Total Bilirubin: 0.7 mg/dL (ref 0.3–1.2)
Total Protein: 8.4 g/dL — ABNORMAL HIGH (ref 6.5–8.1)

## 2021-07-18 LAB — TSH: TSH: 1.311 u[IU]/mL (ref 0.308–3.960)

## 2021-07-18 MED ORDER — IOHEXOL 350 MG/ML SOLN
80.0000 mL | Freq: Once | INTRAVENOUS | Status: AC | PRN
  Administered 2021-07-18: 80 mL via INTRAVENOUS

## 2021-07-20 ENCOUNTER — Other Ambulatory Visit: Payer: Self-pay

## 2021-07-20 ENCOUNTER — Encounter: Payer: Self-pay | Admitting: Hematology and Oncology

## 2021-07-20 ENCOUNTER — Inpatient Hospital Stay (HOSPITAL_BASED_OUTPATIENT_CLINIC_OR_DEPARTMENT_OTHER): Payer: Medicare Other | Admitting: Hematology and Oncology

## 2021-07-20 ENCOUNTER — Inpatient Hospital Stay: Payer: Medicare Other

## 2021-07-20 DIAGNOSIS — C541 Malignant neoplasm of endometrium: Secondary | ICD-10-CM | POA: Insufficient documentation

## 2021-07-20 NOTE — Progress Notes (Signed)
[facility] OFFICE PROGRESS NOTE  Patient Care Team: Buzzy Han, MD as PCP - General (Family Medicine)  ASSESSMENT & PLAN:  Endometrial cancer [facility]) She has excellent response to treatment Based on recent CT imaging findings, she has achieved complete response to therapy We discussed the risk and benefits of continuing treatment as a maintenance treatment versus discontinuation by taking treatment holiday The patient would prefer to stop treatment and take a treatment break I plan to repeat imaging study again next year for further follow-up  Orders Placed This Encounter  Procedures   CT ABDOMEN PELVIS W CONTRAST    Standing Status:   Future    Standing Expiration Date:   07/20/2022    Order Specific Question:   If indicated for the ordered procedure, I authorize the administration of contrast media per Radiology protocol    Answer:   Yes    Order Specific Question:   Preferred imaging location?    Answer:   [facility]    Order Specific Question:   Radiology Contrast Protocol - do NOT remove file path    Answer:   [email or web address]    All questions were answered. The patient knows to call the clinic with any problems, questions or concerns. The total time spent in the appointment was 20 minutes encounter with patients including review of chart and various tests results, discussions about plan of care and coordination of care plan   [name], MD 07/20/2021 11:43 AM  INTERVAL HISTORY: Please see below for problem oriented charting. she returns for treatment follow-up on pembrolizumab with her son for recurrent uterine cancer She tolerated last cycle of therapy well She has no new complaints  REVIEW OF SYSTEMS:   Constitutional: Denies fevers, chills or abnormal weight loss Eyes: Denies blurriness of vision Ears, nose, mouth, throat, and face: Denies mucositis or sore throat Respiratory: Denies cough,  dyspnea or wheezes Cardiovascular: Denies palpitation, chest discomfort or lower extremity swelling Gastrointestinal:  Denies nausea, heartburn or change in bowel habits Skin: Denies abnormal skin rashes Lymphatics: Denies new lymphadenopathy or easy bruising Neurological:Denies numbness, tingling or new weaknesses Behavioral/Psych: Mood is stable, no new changes  All other systems were reviewed with the patient and are negative.  I have reviewed the past medical history, past surgical history, social history and family history with the patient and they are unchanged from previous note.  ALLERGIES:  is allergic to iron, iron dextran, penicillins, amoxicillin, ampicillin, codeine, diamox [acetazolamide], penicillin g, and other.  MEDICATIONS:  Current Outpatient Medications  Medication Sig Dispense Refill   amLODipine (NORVASC) 10 MG tablet Take by mouth.     atorvastatin (LIPITOR) 20 MG tablet Take 1 tablet (20 mg total) by mouth daily. 30 tablet 3   benazepril (LOTENSIN) 20 MG tablet Take 20 mg by mouth daily.     brimonidine (ALPHAGAN) 0.2 % ophthalmic solution      busPIRone (BUSPAR) 7.5 MG tablet Take 7.5 mg by mouth 2 (two) times daily.     dorzolamide-timolol (COSOPT) 22.3-6.8 MG/ML ophthalmic solution Place 1 drop into the left eye 2 (two) times daily.     hydrochlorothiazide (HYDRODIURIL) 25 MG tablet Take 1 tablet (25 mg total) by mouth daily. Discontinue amlodipine 90 tablet 1   latanoprost (XALATAN) 0.005 % ophthalmic solution Apply to eye.     magnesium 30 MG tablet Take by mouth daily.     metoprolol tartrate (LOPRESSOR) 25 MG tablet Take 1 tablet (25 mg total) by mouth 2 (two)  times daily. 180 tablet 1   metoprolol tartrate (LOPRESSOR) 25 MG tablet 25 mg.     Netarsudil Dimesylate 0.02 % SOLN Apply to eye.     ondansetron (ZOFRAN) 8 MG tablet Take 1 tablet (8 mg total) by mouth every 8 (eight) hours as needed for nausea. 30 tablet 3   prednisoLONE acetate (PRED FORTE) 1 %  ophthalmic suspension Apply to eye.     prochlorperazine (COMPAZINE) 10 MG tablet Take 1 tablet (10 mg total) by mouth every 6 (six) hours as needed for nausea or vomiting. 30 tablet 0   VYZULTA 0.024 % SOLN      No current facility-administered medications for this visit.    SUMMARY OF ONCOLOGIC HISTORY: Oncology History Overview Note  MMR - Abnormal   Endometrial cancer ([place])  06/01/2013 Initial Diagnosis   Endometrial cancer   06/01/2013 Initial Biopsy   EMB for AUB Endometrium, biopsy - POSITIVE FOR ENDOMETRIAL ADENOCARCINOMA, SEE COMMENT. Microscopic Comment As sampled the endometrial adenocarcinoma appears to be endometrioid type, FIGO Grade I.   07/21/2013 Surgery   PREOPERATIVE DIAGNOSIS: Grade 1 endometrial carcinoma. Uterine fibroids    PROCEDURE: Total abdominal hysterectomy, bilateral salpingo-oophorectomy, pelvic lymphadenectomy   SURGEON: [name], M.D  SURGICAL FINDINGS: At the time of exploratory laparotomy the uterus was approximately 16-18 weeks size with multiple subserosal fibroids. Tubes and ovaries were normal. On frozen section patient had endometrial carcinoma invading only the inner half of the myometrium. Exploration the upper abdomen revealed no evidence of metastatic disease. There was no pelvic or para-aortic lymphadenopathy.     07/21/2013 Pathologic Stage   1. Uterus +/- tubes/ovaries, neoplastic - INVASIVE ENDOMETRIOID CARCINOMA, FIGO GRADE II, WITH SQUAMOUS DIFFERENTIATION, CONFINED WITHIN INNER HALF OF THE MYOMETRIUM. - LEIOMYOMATA AND ADENOMYOSIS. - CERVIX: BENIGN SQUAMOUS MUCOSA AND ENDOCERVICAL MUCOSA, NO DYSPLASIA OR MALIGNANCY. - BILATERAL OVARIES AND FALLOPIAN TUBES: NO PATHOLOGIC ABNORMALITIES. 2. Lymph nodes, regional resection, left pelvic - FOUR LYMPH NODES, NO EVIDENCE OF METASTATIC CARCINOMA (0/4). 3. Lymph nodes, regional resection, right pelvic - THREE LYMPH NODES, NEGATIVE FOR METASTATIC CARCINOMA (0/3). Microscopic  Comment 1. ONCOLOGY TABLE - UTERUS, CARCINOMA OR CARCINOSARCOMA Specimen: Uterus, cervix, bilateral ovaries and fallopian tubes Procedure: Total hysterectomy and bilateral salpingo-oophorectomy Lymph node sampling performed: Yes Specimen integrity: Intact Maximum tumor size (cm): 2.4 cm, gross measurement Histologic type: Invasive endometrioid carcinoma with squamous differentiation Grade: FIGO II Myometrial invasion: 1.1 cm where myometrium is 3.7 cm in thickness Cervical stromal involvement: Not identified Extent of involvement of other organs: No Lymph vascular invasion: Not identified Peritoneal washings: Negative (Please correlate with [id number]) Lymph nodes: number examined 7; number positive 0 Pelvic lymph nodes: 0 involved of 7 lymph nodes Para-aortic lymph nodes: N/A TNM code: pT1a, pN0 FIGO Stage (based on pathologic findings, needs clinical correlation): IA   09/22/2019 Imaging   CT A/P: There is a left paraspinous mass which is poorly defined and low-density measuring 3 x 2.2 by 5.5 cm. Deviates the left ureter laterally. No additional periaortic masses are identified.   10/13/2019 Pathology Results   A. LYMPH NODE, LEFT PARASPINAL, NEEDLE CORE BIOPSY: - Poorly differentiated carcinoma. COMMENT: Poorly differentiated carcinoma is present in the background of a majority of necrosis and fibrosis tissue. No distinct nodal tissue is identified. Immunohistochemistry for CK7, PAX 8 and ER is positive. CK20, TTF-1, CDX-2 and GATA-3 are negative. The immunophenotype is compatible with the provided clinical history of gynecologic cancer.   10/19/2019 Cancer Staging   Staging form: Corpus Uteri - Carcinoma and  Carcinosarcoma, AJCC 8th Edition - Pathologic: Stage IIIC2 (pT1a, pN2a, cM0) - Signed by [name], MD on 10/29/2019    Genetic Testing   Patient has genetic testing done for MMR. Results revealed patient has the following mutation(s): MMR - Abnormal   10/27/2019 PET scan    1. Poorly marginated hypermetabolic nodal conglomerate in the left para-aortic and left common iliac chains, compatible with nodal metastatic disease. 2. No additional hypermetabolic sites of metastatic disease. 3. Right upper lobe 2 mm solid pulmonary nodule, below PET resolution, recommend attention on follow-up chest CT in 3-6 months. 4. Chronic findings include: Aortic Atherosclerosis (ICD10-I70.0). Coronary atherosclerosis. Mild left colonic diverticulosis. Mild biliary and pancreatic duct dilation of uncertain etiology, unchanged since 09/22/2019 CT.   11/10/2019 - 06/08/2021 Chemotherapy   Patient is on Treatment Plan : UTERINE Pembrolizumab q42d      02/22/2020 PET scan   1. Significant partial metabolic response. Left para-aortic and left common iliac adenopathy is decreased in size and significantly decreased in metabolism. 2. No new or progressive hypermetabolic metastatic disease. 3. Aortic Atherosclerosis (ICD10-I70.0). Additional chronic findings as detailed.   05/16/2020 Imaging   Further decrease in mild left paraaortic and left common iliac lymphadenopathy.   No new or progressive disease within the abdomen or pelvis.   Aortic Atherosclerosis (ICD10-I70.0).     09/19/2020 Imaging   1. Mild response to therapy as evidenced by decrease in abdominopelvic adenopathy. 2. No new sites of disease. 3. Hysterectomy. 4.  Aortic Atherosclerosis (ICD10-I70.0).     01/31/2021 Imaging   1. Stable exam. Unchanged appearance borderline abdominopelvic adenopathy. 2. No new sites of disease. 3. Status post hysterectomy with stable, mild soft tissue thickening along the right side of vaginal cuff. 4.  Aortic Atherosclerosis (ICD10-I70.0).   07/20/2021 Imaging   Continued decrease in size of LEFT periaortic/common iliac soft tissue and post treatment changes.   No new or progressive findings.   Stable common bile duct dilation, up to 11 mm. Intrahepatic biliary duct distension similar  to previous imaging. Correlate with any symptoms that would necessitate further workup but this finding is unchanged dating back to December of 2020.   Aortic Atherosclerosis (ICD10-I70.0).     PHYSICAL EXAMINATION: ECOG PERFORMANCE STATUS: 0 - Asymptomatic  Vitals:   07/20/21 1135  BP: (!) 161/85  Pulse: 93  Resp: 18  Temp: 97.7 F (36.5 C)  SpO2: 100%   Filed Weights   07/20/21 1135  Weight: 158 lb (71.7 kg)    GENERAL:alert, no distress and comfortable NEURO: alert & oriented x 3 with fluent speech, no focal motor/sensory deficits  LABORATORY DATA:  I have reviewed the data as listed    Component Value Date/Time   NA 141 07/18/2021 1030   NA 139 03/03/2019 0934   NA 142 07/09/2013 1144   K 4.0 07/18/2021 1030   K 3.7 07/09/2013 1144   CL 107 07/18/2021 1030   CO2 24 07/18/2021 1030   CO2 23 07/09/2013 1144   GLUCOSE 102 (H) 07/18/2021 1030   GLUCOSE 115 07/09/2013 1144   BUN 14 07/18/2021 1030   BUN 25 03/03/2019 0934   BUN 14.4 07/09/2013 1144   CREATININE 0.88 07/18/2021 1030   CREATININE 0.82 01/02/2017 1159   CREATININE 0.8 07/09/2013 1144   CALCIUM 9.7 07/18/2021 1030   CALCIUM 9.3 07/09/2013 1144   PROT 8.4 (H) 07/18/2021 1030   PROT 8.0 03/03/2019 0934   PROT 7.8 07/09/2013 1144   ALBUMIN 4.3 07/18/2021 1030   ALBUMIN  4.7 03/03/2019 0934   ALBUMIN 3.7 07/09/2013 1144   AST 17 07/18/2021 1030   AST 9 07/09/2013 1144   ALT 12 07/18/2021 1030   ALT <6 07/09/2013 1144   ALKPHOS 114 07/18/2021 1030   ALKPHOS 58 07/09/2013 1144   BILITOT 0.7 07/18/2021 1030   BILITOT 0.40 07/09/2013 1144   GFRNONAA >60 07/18/2021 1030   GFRNONAA 74 02/01/2015 1238   GFRAA >60 06/28/2020 1031   GFRAA 85 02/01/2015 1238    No results found for: SPEP, UPEP  Lab Results  Component Value Date   WBC 5.7 07/18/2021   NEUTROABS 3.7 07/18/2021   HGB 14.0 07/18/2021   HCT 40.3 07/18/2021   MCV 88.0 07/18/2021   PLT 163 07/18/2021      Chemistry       Component Value Date/Time   NA 141 07/18/2021 1030   NA 139 03/03/2019 0934   NA 142 07/09/2013 1144   K 4.0 07/18/2021 1030   K 3.7 07/09/2013 1144   CL 107 07/18/2021 1030   CO2 24 07/18/2021 1030   CO2 23 07/09/2013 1144   BUN 14 07/18/2021 1030   BUN 25 03/03/2019 0934   BUN 14.4 07/09/2013 1144   CREATININE 0.88 07/18/2021 1030   CREATININE 0.82 01/02/2017 1159   CREATININE 0.8 07/09/2013 1144      Component Value Date/Time   CALCIUM 9.7 07/18/2021 1030   CALCIUM 9.3 07/09/2013 1144   ALKPHOS 114 07/18/2021 1030   ALKPHOS 58 07/09/2013 1144   AST 17 07/18/2021 1030   AST 9 07/09/2013 1144   ALT 12 07/18/2021 1030   ALT <6 07/09/2013 1144   BILITOT 0.7 07/18/2021 1030   BILITOT 0.40 07/09/2013 1144       RADIOGRAPHIC STUDIES: I have personally reviewed the radiological images as listed and agreed with the findings in the report. CT ABDOMEN PELVIS W CONTRAST  Result Date: 07/19/2021 CLINICAL DATA:  History of uterine/cervical cancer in a 75 year old female, assess treatment response. History of hysterectomy. EXAM: CT ABDOMEN AND PELVIS WITH CONTRAST TECHNIQUE: Multidetector CT imaging of the abdomen and pelvis was performed using the standard protocol following bolus administration of intravenous contrast. CONTRAST:  18m OMNIPAQUE IOHEXOL 350 MG/ML SOLN COMPARISON:  January 31, 2021. FINDINGS: Lower chest: No effusion or consolidation at the lung bases. Hepatobiliary: Stable common hepatic undo that stable common bile duct dilation, up to 11 mm. Gallbladder without distension or sign of adjacent inflammation. Hepatic cysts as before. Intrahepatic biliary duct distension similar to previous imaging. Pancreas: Mild prominence of main pancreatic duct is unchanged. No signs of peripancreatic stranding or focal pancreatic lesion. Spleen: Normal. Adrenals/Urinary Tract: Adrenal glands are normal. Symmetric renal enhancement. Signs small bilateral renal lesions without change, likely  small cysts but too small for definitive characterization. Urinary bladder without adjacent stranding. Stomach/Bowel: No pericolonic stranding. Normal appendix. No acute gastrointestinal process. Vascular/Lymphatic: Atherosclerotic changes throughout the abdominal aorta. Soft tissue/nodal disease along the LEFT common iliac chain and para-aortic region (image 45/2) 10 mm as compared to 12 mm on the prior study. Soft tissue adjacent to LEFT common iliac lymph nodes (image 50/2) 7 mm unchanged posterior to iliac vessels. Reproductive: Post hysterectomy without signs of adnexal mass. Other: No ascites.  No peritoneal nodularity.  No free air. Musculoskeletal: Spinal degenerative changes. No acute or destructive bone process. Signs of avascular necrosis of RIGHT femoral head potentially early changes on the LEFT. Attention on follow-up. IMPRESSION: Continued decrease in size of LEFT periaortic/common iliac soft tissue  and post treatment changes. No new or progressive findings. Stable common bile duct dilation, up to 11 mm. Intrahepatic biliary duct distension similar to previous imaging. Correlate with any symptoms that would necessitate further workup but this finding is unchanged dating back to December of 2020. Aortic Atherosclerosis (ICD10-I70.0). Electronically Signed   By: Zetta Bills M.D.   On: 07/19/2021 16:38

## 2021-07-20 NOTE — Assessment & Plan Note (Signed)
She has excellent response to treatment Based on recent CT imaging findings, she has achieved complete response to therapy We discussed the risk and benefits of continuing treatment as a maintenance treatment versus discontinuation by taking treatment holiday The patient would prefer to stop treatment and take a treatment break I plan to repeat imaging study again next year for further follow-up

## 2021-10-18 ENCOUNTER — Other Ambulatory Visit: Payer: Self-pay | Admitting: Hematology and Oncology

## 2021-10-18 ENCOUNTER — Telehealth: Payer: Self-pay

## 2021-10-18 DIAGNOSIS — C541 Malignant neoplasm of endometrium: Secondary | ICD-10-CM

## 2021-10-18 NOTE — Telephone Encounter (Signed)
Called and spoke with son. He is in the ER. He ask that I call his mom and give her appts. On 1/24 at 1030 lab at [facility], then go to [facility] for CT scan at 1115, NPO 4 hours prior, drink the first bottle of contrast at 0930 and 2nd bottle at 1030. She will have someone pick up the contrast prior to appt and verbalized understanding.

## 2021-10-30 IMAGING — PT NM PET TUM IMG RESTAG (PS) SKULL BASE T - THIGH
1 of 8 series · 2 of 25 positions shown · non-contrast
Comparison: 09/22/2019 CT abdomen/pelvis.

CLINICAL DATA: Initial treatment strategy for recurrent endometrial
carcinoma diagnosed on left para-aortic mass biopsy from 10/13/2019.

EXAM:
NUCLEAR MEDICINE PET SKULL BASE TO THIGH
TECHNIQUE: 7.6 mCi F-18 FDG was injected intravenously. Full-ring PET imaging
was performed from the skull base to thigh after the radiotracer. CT
data was obtained and used for attenuation correction and anatomic
localization.
Fasting blood glucose: 137 mg/dl

[Series 4: ct sk_thigh 5.0 b31f · axial · 5.0mm · 0.98mm/px · z∈[-668,-8]mm · 2 of 220 slices shown]
[im 55/220  brain]
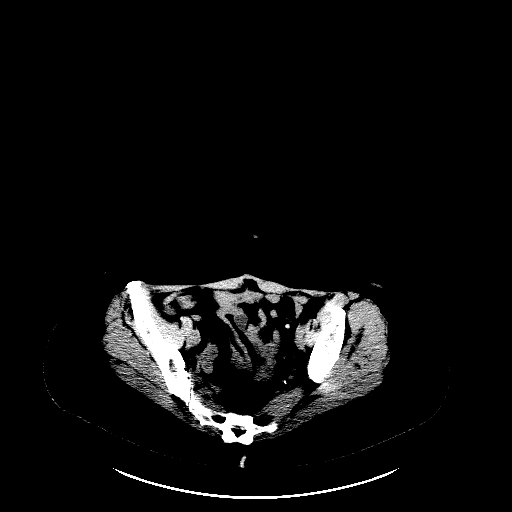
[im 220/220  brain]
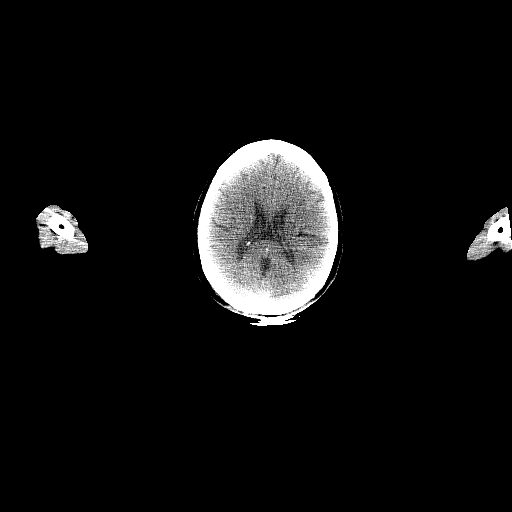

[2 of 25 positions shown; findings below may reference images not displayed]

FINDINGS: Mediastinal blood pool activity: SUV max

Liver activity: SUV max NA

NECK: No hypermetabolic lymph nodes in the neck.

Incidental CT findings: Hypodense 1.0 cm right thyroid nodule, non
hypermetabolic, requiring no follow-up.

CHEST: No enlarged or hypermetabolic axillary, mediastinal or hilar
lymph nodes. No hypermetabolic pulmonary findings.

Incidental CT findings: Coronary atherosclerosis. Atherosclerotic
nonaneurysmal thoracic aorta. No acute consolidative airspace
disease or lung masses. Peripheral right upper lobe 2 mm solid
pulmonary nodule is below PET resolution (series 9/image 47). No
additional significant pulmonary nodules.

ABDOMEN/PELVIS:

There is a single poorly marginated intensely hypermetabolic nodal
conglomerate in the left para-aortic and left common iliac chains
measuring 5.1 x 3.4 x 8.3 cm with max SUV 17.3 (series 4/image 144).
No additional enlarged or hypermetabolic lymph nodes in the abdomen
or pelvis.

No abnormal hypermetabolic activity within the liver, pancreas,
adrenal glands, or spleen.

Incidental CT findings: Subcentimeter hypodense right liver lesions
are too small to characterize and require no follow-up. Mildly
dilated common bile duct (7 mm diameter) and borderline mild main
pancreatic duct dilation (3 mm diameter), unchanged from recent CT
abdomen study. Atherosclerotic nonaneurysmal abdominal aorta. Mild
left colonic diverticulosis. Hysterectomy.

SKELETON: No focal hypermetabolic activity to suggest skeletal
metastasis.

Incidental CT findings: none
IMPRESSION: 1. Poorly marginated hypermetabolic nodal conglomerate in the left
para-aortic and left common iliac chains, compatible with nodal
metastatic disease.
2. No additional hypermetabolic sites of metastatic disease.
3. Right upper lobe 2 mm solid pulmonary nodule, below PET
resolution, recommend attention on follow-up chest CT in 3-6 months.
4. Chronic findings include: Aortic Atherosclerosis ([id number]-[id number]).
Coronary atherosclerosis. Mild left colonic diverticulosis. Mild
biliary and pancreatic duct dilation of uncertain etiology,
unchanged since 09/22/2019 CT.

## 2021-11-07 ENCOUNTER — Inpatient Hospital Stay: Payer: Medicare Other

## 2021-11-07 ENCOUNTER — Ambulatory Visit (HOSPITAL_COMMUNITY): Payer: Medicare Other

## 2021-11-07 ENCOUNTER — Telehealth: Payer: Self-pay | Admitting: Oncology

## 2021-11-07 NOTE — Telephone Encounter (Signed)
Called Stephanie [name] back with rescheduled lab appointment at 11:00 before her CT on 11/16/21 and appointment with Dr. Alvy Bimler on 11/20/21 at 12:00 for follow up.  She verbalized understanding and agreement of new appointment times.

## 2021-11-07 NOTE — Telephone Encounter (Signed)
Stephanie [name] called and said she has to reschedule her CT for today because her son's back is out and he provides transportation for her.  It has been rescheduled for 11/16/21.  She is wondering if she can reschedule her appointment with Dr. Alvy Bimler to review the scan.

## 2021-11-07 NOTE — Telephone Encounter (Signed)
The following Monday, 12 pm, 20 mins

## 2021-11-09 ENCOUNTER — Inpatient Hospital Stay: Payer: Medicare Other | Admitting: Hematology and Oncology

## 2021-11-15 ENCOUNTER — Telehealth: Payer: Self-pay | Admitting: Oncology

## 2021-11-15 NOTE — Telephone Encounter (Signed)
Stephanie [name] called and said the transmission went out on her son's car and will need to reschedule her upcoming appointments.  Discussed the [facility] and she would like to be enrolled so she can attend her appointments.  Contacted Erica to contact patient about using the transportation program.

## 2021-11-16 ENCOUNTER — Other Ambulatory Visit: Payer: Self-pay

## 2021-11-16 ENCOUNTER — Inpatient Hospital Stay: Payer: Medicare Other | Attending: Gynecologic Oncology

## 2021-11-16 ENCOUNTER — Other Ambulatory Visit: Payer: Medicare Other

## 2021-11-16 ENCOUNTER — Ambulatory Visit (HOSPITAL_COMMUNITY)
Admission: RE | Admit: 2021-11-16 | Discharge: 2021-11-16 | Disposition: A | Discharge status: Home / Self Care | Payer: Medicare Other | Source: Ambulatory Visit | Attending: Hematology and Oncology | Admitting: Hematology and Oncology

## 2021-11-16 ENCOUNTER — Encounter (HOSPITAL_COMMUNITY): Payer: Self-pay

## 2021-11-16 DIAGNOSIS — Z923 Personal history of irradiation: Secondary | ICD-10-CM | POA: Insufficient documentation

## 2021-11-16 DIAGNOSIS — C541 Malignant neoplasm of endometrium: Secondary | ICD-10-CM | POA: Insufficient documentation

## 2021-11-16 DIAGNOSIS — Z90710 Acquired absence of both cervix and uterus: Secondary | ICD-10-CM | POA: Insufficient documentation

## 2021-11-16 DIAGNOSIS — Z79899 Other long term (current) drug therapy: Secondary | ICD-10-CM | POA: Insufficient documentation

## 2021-11-16 DIAGNOSIS — K838 Other specified diseases of biliary tract: Secondary | ICD-10-CM | POA: Diagnosis not present

## 2021-11-16 DIAGNOSIS — I700 Atherosclerosis of aorta: Secondary | ICD-10-CM | POA: Insufficient documentation

## 2021-11-16 DIAGNOSIS — I10 Essential (primary) hypertension: Secondary | ICD-10-CM | POA: Insufficient documentation

## 2021-11-16 LAB — CBC WITH DIFFERENTIAL (CANCER CENTER ONLY)
Abs Immature Granulocytes: 0.02 10*3/uL (ref 0.00–0.07)
Basophils Absolute: 0 10*3/uL (ref 0.0–0.1)
Basophils Relative: 1 %
Eosinophils Absolute: 0.1 10*3/uL (ref 0.0–0.5)
Eosinophils Relative: 2 %
HCT: 43.4 % (ref 36.0–46.0)
Hemoglobin: 14.9 g/dL (ref 12.0–15.0)
Immature Granulocytes: 0 %
Lymphocytes Relative: 24 %
Lymphs Abs: 1.5 10*3/uL (ref 0.7–4.0)
MCH: 30.7 pg (ref 26.0–34.0)
MCHC: 34.3 g/dL (ref 30.0–36.0)
MCV: 89.3 fL (ref 80.0–100.0)
Monocytes Absolute: 0.4 10*3/uL (ref 0.1–1.0)
Monocytes Relative: 7 %
Neutro Abs: 4.3 10*3/uL (ref 1.7–7.7)
Neutrophils Relative %: 66 %
Platelet Count: 176 10*3/uL (ref 150–400)
RBC: 4.86 MIL/uL (ref 3.87–5.11)
RDW: 13.1 % (ref 11.5–15.5)
WBC Count: 6.4 10*3/uL (ref 4.0–10.5)
nRBC: 0 % (ref 0.0–0.2)

## 2021-11-16 LAB — CMP (CANCER CENTER ONLY)
ALT: 14 U/L (ref 0–44)
AST: 16 U/L (ref 15–41)
Albumin: 4.7 g/dL (ref 3.5–5.0)
Alkaline Phosphatase: 120 U/L (ref 38–126)
Anion gap: 7 (ref 5–15)
BUN: 18 mg/dL (ref 8–23)
CO2: 28 mmol/L (ref 22–32)
Calcium: 9.7 mg/dL (ref 8.9–10.3)
Chloride: 105 mmol/L (ref 98–111)
Creatinine: 0.9 mg/dL (ref 0.44–1.00)
GFR, Estimated: >60 mL/min (ref >60)
Glucose, Bld: 106 mg/dL — ABNORMAL HIGH (ref 70–99)
Potassium: 3.8 mmol/L (ref 3.5–5.1)
Sodium: 140 mmol/L (ref 135–145)
Total Bilirubin: 0.8 mg/dL (ref 0.3–1.2)
Total Protein: 8.5 g/dL — ABNORMAL HIGH (ref 6.5–8.1)

## 2021-11-16 LAB — TSH: TSH: 0.911 u[IU]/mL (ref 0.308–3.960)

## 2021-11-16 MED ORDER — IOHEXOL 300 MG/ML  SOLN
100.0000 mL | Freq: Once | INTRAMUSCULAR | Status: AC | PRN
  Administered 2021-11-16: 100 mL via INTRAVENOUS

## 2021-11-16 MED ORDER — SODIUM CHLORIDE (PF) 0.9 % IJ SOLN
INTRAMUSCULAR | Status: AC
  Filled 2021-11-16: qty 50

## 2021-11-20 ENCOUNTER — Inpatient Hospital Stay (HOSPITAL_BASED_OUTPATIENT_CLINIC_OR_DEPARTMENT_OTHER): Payer: Medicare Other | Admitting: Hematology and Oncology

## 2021-11-20 ENCOUNTER — Other Ambulatory Visit: Payer: Self-pay

## 2021-11-20 ENCOUNTER — Encounter: Payer: Self-pay | Admitting: Hematology and Oncology

## 2021-11-20 DIAGNOSIS — Z79899 Other long term (current) drug therapy: Secondary | ICD-10-CM | POA: Diagnosis not present

## 2021-11-20 DIAGNOSIS — C541 Malignant neoplasm of endometrium: Secondary | ICD-10-CM | POA: Diagnosis present

## 2021-11-20 DIAGNOSIS — I10 Essential (primary) hypertension: Secondary | ICD-10-CM

## 2021-11-20 NOTE — Assessment & Plan Note (Signed)
There were recent changes to her blood pressure medications Her blood pressure is typically normal at home but elevated today likely due to anxiety Observe closely for now

## 2021-11-20 NOTE — Assessment & Plan Note (Signed)
I have reviewed multiple imaging studies with the patient and her son She has no clinical signs of active disease We discussed close monitoring and follow-up every 3 to 6 months for the first year after discontinuation of treatment She is in agreement The patient is educated to watch out for signs and symptoms of cancer recurrence

## 2021-11-20 NOTE — Progress Notes (Signed)
[place] OFFICE PROGRESS NOTE  Patient Care Team: Margretta Sidle, MD as PCP - General  ASSESSMENT & PLAN:  Endometrial cancer [facility]) I have reviewed multiple imaging studies with the patient and her son She has no clinical signs of active disease We discussed close monitoring and follow-up every 3 to 6 months for the first year after discontinuation of treatment She is in agreement The patient is educated to watch out for signs and symptoms of cancer recurrence  Essential hypertension There were recent changes to her blood pressure medications Her blood pressure is typically normal at home but elevated today likely due to anxiety Observe closely for now  Orders Placed This Encounter  Procedures   CT ABDOMEN PELVIS W CONTRAST    Standing Status:   Future    Standing Expiration Date:   11/20/2022    Order Specific Question:   If indicated for the ordered procedure, I authorize the administration of contrast media per Radiology protocol    Answer:   Yes    Order Specific Question:   Preferred imaging location?    Answer:   [facility]    Order Specific Question:   Radiology Contrast Protocol - do NOT remove file path    Answer:   [email or web address]   Comprehensive metabolic panel    Standing Status:   Standing    Number of Occurrences:   33    Standing Expiration Date:   11/20/2022   CBC with Differential ([place] Only)    Standing Status:   Future    Standing Expiration Date:   11/20/2022    All questions were answered. The patient knows to call the clinic with any problems, questions or concerns. The total time spent in the appointment was 30 minutes encounter with patients including review of chart and various tests results, discussions about plan of care and coordination of care plan   [name], MD 11/20/2021 1:35 PM  INTERVAL HISTORY: Please see below for problem oriented charting. she returns for  treatment follow-up with her son She is doing well Denies abdominal pain or changes in bowel habits  REVIEW OF SYSTEMS:   Constitutional: Denies fevers, chills or abnormal weight loss Eyes: Denies blurriness of vision Ears, nose, mouth, throat, and face: Denies mucositis or sore throat Respiratory: Denies cough, dyspnea or wheezes Cardiovascular: Denies palpitation, chest discomfort or lower extremity swelling Gastrointestinal:  Denies nausea, heartburn or change in bowel habits Skin: Denies abnormal skin rashes Lymphatics: Denies new lymphadenopathy or easy bruising Neurological:Denies numbness, tingling or new weaknesses Behavioral/Psych: Mood is stable, no new changes  All other systems were reviewed with the patient and are negative.  I have reviewed the past medical history, past surgical history, social history and family history with the patient and they are unchanged from previous note.  ALLERGIES:  is allergic to iron, iron dextran, penicillins, amoxicillin, ampicillin, codeine, diamox [acetazolamide], penicillin g, and other.  MEDICATIONS:  Current Outpatient Medications  Medication Sig Dispense Refill   amLODipine (NORVASC) 10 MG tablet Take by mouth.     atorvastatin (LIPITOR) 20 MG tablet Take 1 tablet (20 mg total) by mouth daily. 30 tablet 3   benazepril (LOTENSIN) 20 MG tablet Take 20 mg by mouth daily.     brimonidine (ALPHAGAN) 0.2 % ophthalmic solution      busPIRone (BUSPAR) 7.5 MG tablet Take 7.5 mg by mouth 2 (two) times daily.     dorzolamide-timolol (COSOPT) 22.3-6.8 MG/ML ophthalmic solution Place 1 drop  into the left eye 2 (two) times daily.     hydrochlorothiazide (HYDRODIURIL) 25 MG tablet Take 1 tablet (25 mg total) by mouth daily. Discontinue amlodipine 90 tablet 1   latanoprost (XALATAN) 0.005 % ophthalmic solution Apply to eye.     magnesium 30 MG tablet Take by mouth daily.     metoprolol tartrate (LOPRESSOR) 25 MG tablet Take 1 tablet (25 mg total)  by mouth 2 (two) times daily. 180 tablet 1   metoprolol tartrate (LOPRESSOR) 25 MG tablet 25 mg.     Netarsudil Dimesylate 0.02 % SOLN Apply to eye.     ondansetron (ZOFRAN) 8 MG tablet Take 1 tablet (8 mg total) by mouth every 8 (eight) hours as needed for nausea. 30 tablet 3   prednisoLONE acetate (PRED FORTE) 1 % ophthalmic suspension Apply to eye.     prochlorperazine (COMPAZINE) 10 MG tablet Take 1 tablet (10 mg total) by mouth every 6 (six) hours as needed for nausea or vomiting. 30 tablet 0   VYZULTA 0.024 % SOLN      No current facility-administered medications for this visit.    SUMMARY OF ONCOLOGIC HISTORY: Oncology History Overview Note  MMR - Abnormal   Endometrial cancer ([place])  06/01/2013 Initial Diagnosis   Endometrial cancer   06/01/2013 Initial Biopsy   EMB for AUB Endometrium, biopsy - POSITIVE FOR ENDOMETRIAL ADENOCARCINOMA, SEE COMMENT. Microscopic Comment As sampled the endometrial adenocarcinoma appears to be endometrioid type, FIGO Grade I.   07/21/2013 Surgery   PREOPERATIVE DIAGNOSIS: Grade 1 endometrial carcinoma. Uterine fibroids    PROCEDURE: Total abdominal hysterectomy, bilateral salpingo-oophorectomy, pelvic lymphadenectomy   SURGEON: [name], M.D  SURGICAL FINDINGS: At the time of exploratory laparotomy the uterus was approximately 16-18 weeks size with multiple subserosal fibroids. Tubes and ovaries were normal. On frozen section patient had endometrial carcinoma invading only the inner half of the myometrium. Exploration the upper abdomen revealed no evidence of metastatic disease. There was no pelvic or para-aortic lymphadenopathy.     07/21/2013 Pathologic Stage   1. Uterus +/- tubes/ovaries, neoplastic - INVASIVE ENDOMETRIOID CARCINOMA, FIGO GRADE II, WITH SQUAMOUS DIFFERENTIATION, CONFINED WITHIN INNER HALF OF THE MYOMETRIUM. - LEIOMYOMATA AND ADENOMYOSIS. - CERVIX: BENIGN SQUAMOUS MUCOSA AND ENDOCERVICAL MUCOSA, NO DYSPLASIA  OR MALIGNANCY. - BILATERAL OVARIES AND FALLOPIAN TUBES: NO PATHOLOGIC ABNORMALITIES. 2. Lymph nodes, regional resection, left pelvic - FOUR LYMPH NODES, NO EVIDENCE OF METASTATIC CARCINOMA (0/4). 3. Lymph nodes, regional resection, right pelvic - THREE LYMPH NODES, NEGATIVE FOR METASTATIC CARCINOMA (0/3). Microscopic Comment 1. ONCOLOGY TABLE - UTERUS, CARCINOMA OR CARCINOSARCOMA Specimen: Uterus, cervix, bilateral ovaries and fallopian tubes Procedure: Total hysterectomy and bilateral salpingo-oophorectomy Lymph node sampling performed: Yes Specimen integrity: Intact Maximum tumor size (cm): 2.4 cm, gross measurement Histologic type: Invasive endometrioid carcinoma with squamous differentiation Grade: FIGO II Myometrial invasion: 1.1 cm where myometrium is 3.7 cm in thickness Cervical stromal involvement: Not identified Extent of involvement of other organs: No Lymph vascular invasion: Not identified Peritoneal washings: Negative (Please correlate with [id number]) Lymph nodes: number examined 7; number positive 0 Pelvic lymph nodes: 0 involved of 7 lymph nodes Para-aortic lymph nodes: N/A TNM code: pT1a, pN0 FIGO Stage (based on pathologic findings, needs clinical correlation): IA   09/22/2019 Imaging   CT A/P: There is a left paraspinous mass which is poorly defined and low-density measuring 3 x 2.2 by 5.5 cm. Deviates the left ureter laterally. No additional periaortic masses are identified.   10/13/2019 Pathology Results   A. LYMPH NODE,  LEFT PARASPINAL, NEEDLE CORE BIOPSY: - Poorly differentiated carcinoma. COMMENT: Poorly differentiated carcinoma is present in the background of a majority of necrosis and fibrosis tissue. No distinct nodal tissue is identified. Immunohistochemistry for CK7, PAX 8 and ER is positive. CK20, TTF-1, CDX-2 and GATA-3 are negative. The immunophenotype is compatible with the provided clinical history of gynecologic cancer.   10/19/2019 Cancer Staging    Staging form: Corpus Uteri - Carcinoma and Carcinosarcoma, AJCC 8th Edition - Pathologic: Stage IIIC2 (pT1a, pN2a, cM0) - Signed by [name], MD on 10/29/2019     Genetic Testing   Patient has genetic testing done for MMR. Results revealed patient has the following mutation(s): MMR - Abnormal   10/27/2019 PET scan   1. Poorly marginated hypermetabolic nodal conglomerate in the left para-aortic and left common iliac chains, compatible with nodal metastatic disease. 2. No additional hypermetabolic sites of metastatic disease. 3. Right upper lobe 2 mm solid pulmonary nodule, below PET resolution, recommend attention on follow-up chest CT in 3-6 months. 4. Chronic findings include: Aortic Atherosclerosis (ICD10-I70.0). Coronary atherosclerosis. Mild left colonic diverticulosis. Mild biliary and pancreatic duct dilation of uncertain etiology, unchanged since 09/22/2019 CT.   11/10/2019 - 06/08/2021 Chemotherapy   Patient is on Treatment Plan : UTERINE Pembrolizumab q42d      02/22/2020 PET scan   1. Significant partial metabolic response. Left para-aortic and left common iliac adenopathy is decreased in size and significantly decreased in metabolism. 2. No new or progressive hypermetabolic metastatic disease. 3. Aortic Atherosclerosis (ICD10-I70.0). Additional chronic findings as detailed.   05/16/2020 Imaging   Further decrease in mild left paraaortic and left common iliac lymphadenopathy.   No new or progressive disease within the abdomen or pelvis.   Aortic Atherosclerosis (ICD10-I70.0).     09/19/2020 Imaging   1. Mild response to therapy as evidenced by decrease in abdominopelvic adenopathy. 2. No new sites of disease. 3. Hysterectomy. 4.  Aortic Atherosclerosis (ICD10-I70.0).     01/31/2021 Imaging   1. Stable exam. Unchanged appearance borderline abdominopelvic adenopathy. 2. No new sites of disease. 3. Status post hysterectomy with stable, mild soft tissue thickening along  the right side of vaginal cuff. 4.  Aortic Atherosclerosis (ICD10-I70.0).   07/20/2021 Imaging   Continued decrease in size of LEFT periaortic/common iliac soft tissue and post treatment changes.   No new or progressive findings.   Stable common bile duct dilation, up to 11 mm. Intrahepatic biliary duct distension similar to previous imaging. Correlate with any symptoms that would necessitate further workup but this finding is unchanged dating back to December of 2020.   Aortic Atherosclerosis (ICD10-I70.0).   11/18/2021 Imaging   1. Status post hysterectomy. No evidence of pelvic mass or discrete lymphadenopathy. 2. Post treatment appearance of soft tissue at the left aspect of the inferior aorta and left common iliac artery is unchanged. 3. Unchanged common bile duct dilatation up to 1.0 cm with associated intrahepatic biliary ductal dilatation. No gallstones or gallbladder wall thickening. In the absence of signs and symptoms of cholestasis, this is of doubtful clinical significance given stability.   Aortic Atherosclerosis (ICD10-I70.0).     PHYSICAL EXAMINATION: ECOG PERFORMANCE STATUS: 1 - Symptomatic but completely ambulatory  Vitals:   11/20/21 1205  BP: (!) 179/98  Pulse: (!) 117  Resp: 18  Temp: 97.9 F (36.6 C)  SpO2: 100%   Filed Weights   11/20/21 1205  Weight: 162 lb 8 oz (73.7 kg)    GENERAL:alert, no distress and comfortable NEURO: alert &  oriented x 3 with fluent speech, no focal motor/sensory deficits  LABORATORY DATA:  I have reviewed the data as listed    Component Value Date/Time   NA 140 11/16/2021 1104   NA 139 03/03/2019 0934   NA 142 07/09/2013 1144   K 3.8 11/16/2021 1104   K 3.7 07/09/2013 1144   CL 105 11/16/2021 1104   CO2 28 11/16/2021 1104   CO2 23 07/09/2013 1144   GLUCOSE 106 (H) 11/16/2021 1104   GLUCOSE 115 07/09/2013 1144   BUN 18 11/16/2021 1104   BUN 25 03/03/2019 0934   BUN 14.4 07/09/2013 1144   CREATININE 0.90 11/16/2021  1104   CREATININE 0.82 01/02/2017 1159   CREATININE 0.8 07/09/2013 1144   CALCIUM 9.7 11/16/2021 1104   CALCIUM 9.3 07/09/2013 1144   PROT 8.5 (H) 11/16/2021 1104   PROT 8.0 03/03/2019 0934   PROT 7.8 07/09/2013 1144   ALBUMIN 4.7 11/16/2021 1104   ALBUMIN 4.7 03/03/2019 0934   ALBUMIN 3.7 07/09/2013 1144   AST 16 11/16/2021 1104   AST 9 07/09/2013 1144   ALT 14 11/16/2021 1104   ALT <6 07/09/2013 1144   ALKPHOS 120 11/16/2021 1104   ALKPHOS 58 07/09/2013 1144   BILITOT 0.8 11/16/2021 1104   BILITOT 0.40 07/09/2013 1144   GFRNONAA >60 11/16/2021 1104   GFRNONAA 74 02/01/2015 1238   GFRAA >60 06/28/2020 1031   GFRAA 85 02/01/2015 1238    No results found for: SPEP, UPEP  Lab Results  Component Value Date   WBC 6.4 11/16/2021   NEUTROABS 4.3 11/16/2021   HGB 14.9 11/16/2021   HCT 43.4 11/16/2021   MCV 89.3 11/16/2021   PLT 176 11/16/2021      Chemistry      Component Value Date/Time   NA 140 11/16/2021 1104   NA 139 03/03/2019 0934   NA 142 07/09/2013 1144   K 3.8 11/16/2021 1104   K 3.7 07/09/2013 1144   CL 105 11/16/2021 1104   CO2 28 11/16/2021 1104   CO2 23 07/09/2013 1144   BUN 18 11/16/2021 1104   BUN 25 03/03/2019 0934   BUN 14.4 07/09/2013 1144   CREATININE 0.90 11/16/2021 1104   CREATININE 0.82 01/02/2017 1159   CREATININE 0.8 07/09/2013 1144      Component Value Date/Time   CALCIUM 9.7 11/16/2021 1104   CALCIUM 9.3 07/09/2013 1144   ALKPHOS 120 11/16/2021 1104   ALKPHOS 58 07/09/2013 1144   AST 16 11/16/2021 1104   AST 9 07/09/2013 1144   ALT 14 11/16/2021 1104   ALT <6 07/09/2013 1144   BILITOT 0.8 11/16/2021 1104   BILITOT 0.40 07/09/2013 1144       RADIOGRAPHIC STUDIES: I have reviewed multiple imaging studies with the patient and her son I have personally reviewed the radiological images as listed and agreed with the findings in the report. CT ABDOMEN PELVIS W CONTRAST  Result Date: 11/18/2021 CLINICAL DATA:  Endometrial cancer  staging, recurrence, status post XRT and Keytruda EXAM: CT ABDOMEN AND PELVIS WITH CONTRAST TECHNIQUE: Multidetector CT imaging of the abdomen and pelvis was performed using the standard protocol following bolus administration of intravenous contrast. RADIATION DOSE REDUCTION: This exam was performed according to the departmental dose-optimization program which includes automated exposure control, adjustment of the mA and/or kV according to patient size and/or use of iterative reconstruction technique. CONTRAST:  130m OMNIPAQUE IOHEXOL 300 MG/ML SOLN, additional oral enteric contrast COMPARISON:  07/18/2021 FINDINGS: Lower chest: No acute abnormality.  Hepatobiliary: No solid liver abnormality is seen. Stable, benign small cysts of the right lobe of the liver (series 2, image 15). No gallstones or gallbladder wall thickening. Unchanged common bile duct dilatation up to 1.0 cm with associated intrahepatic biliary ductal dilatation. Pancreas: Unremarkable. No pancreatic ductal dilatation or surrounding inflammatory changes. Spleen: Normal in size without significant abnormality. Adrenals/Urinary Tract: Adrenal glands are unremarkable. Kidneys are normal, without renal calculi, solid lesion, or hydronephrosis. Bladder is unremarkable. Stomach/Bowel: Stomach is within normal limits. Appendix not clearly visualized and may be surgically absent. No evidence of bowel wall thickening, distention, or inflammatory changes. Moderate burden of stool throughout the colon and rectum. Vascular/Lymphatic: Aortic atherosclerosis. No enlarged abdominal or pelvic lymph nodes. Soft tissue at the left aspect of the inferior aorta and left common iliac artery is unchanged, measuring approximately 1.9 x 1.0 cm (series 2, image 41). Reproductive: Status post hysterectomy. Other: No abdominal wall hernia or abnormality. No ascites. Musculoskeletal: No acute or significant osseous findings. IMPRESSION: 1. Status post hysterectomy. No  evidence of pelvic mass or discrete lymphadenopathy. 2. Post treatment appearance of soft tissue at the left aspect of the inferior aorta and left common iliac artery is unchanged. 3. Unchanged common bile duct dilatation up to 1.0 cm with associated intrahepatic biliary ductal dilatation. No gallstones or gallbladder wall thickening. In the absence of signs and symptoms of cholestasis, this is of doubtful clinical significance given stability. Aortic Atherosclerosis (ICD10-I70.0). Electronically Signed   By: Delanna Ahmadi M.D.   On: 11/18/2021 12:25

## 2022-04-19 ENCOUNTER — Telehealth: Payer: Self-pay

## 2022-04-19 NOTE — Telephone Encounter (Signed)
Called and left below message for son. Left radiology scheduling # and ask him to call to schedule CT.

## 2022-04-19 NOTE — Telephone Encounter (Signed)
-----   Message from [name], MD sent at 04/19/2022 12:22 PM EDT ----- Pls call her son and remind him to call and schedule CT to be done on 8/7 after labs

## 2022-05-07 ENCOUNTER — Telehealth: Payer: Self-pay

## 2022-05-07 NOTE — Telephone Encounter (Signed)
Called Stephanie [name] and offered to assist her with scheduling CT scan on 8/7. She would like assistance with radiology scheduling and call her back tomorrow with details. She verbalized understanding.

## 2022-05-07 NOTE — Telephone Encounter (Signed)
-----   Message from [name], MD sent at 04/19/2022 12:22 PM EDT ----- Pls call her son and remind him to call and schedule CT to be done on 8/7 after labs

## 2022-05-08 NOTE — Telephone Encounter (Signed)
Called and left a message asking her to call the office back. On 8/7 lab appt scheduled at [facility] at 1130 and CT scheduled at 1230 at [facility]. NPO 4 hours prior to CT and drink the 1 st bottle of contrast 1030 and 2nd bottle at 1130. Need to pick up contrast prior to the day of CT.

## 2022-05-08 NOTE — Telephone Encounter (Signed)
She called back. Given below info on Appts on 8/7 and she verbalized understanding.

## 2022-05-21 ENCOUNTER — Ambulatory Visit (HOSPITAL_COMMUNITY)
Admission: RE | Admit: 2022-05-21 | Discharge: 2022-05-21 | Disposition: A | Discharge status: Home / Self Care | Payer: Medicare Other | Source: Ambulatory Visit | Attending: Hematology and Oncology | Admitting: Hematology and Oncology

## 2022-05-21 ENCOUNTER — Inpatient Hospital Stay: Payer: Medicare Other

## 2022-05-21 ENCOUNTER — Inpatient Hospital Stay: Payer: Medicare Other | Attending: Hematology and Oncology

## 2022-05-21 DIAGNOSIS — K5909 Other constipation: Secondary | ICD-10-CM | POA: Insufficient documentation

## 2022-05-21 DIAGNOSIS — C541 Malignant neoplasm of endometrium: Secondary | ICD-10-CM | POA: Diagnosis present

## 2022-05-21 DIAGNOSIS — Z8542 Personal history of malignant neoplasm of other parts of uterus: Secondary | ICD-10-CM | POA: Insufficient documentation

## 2022-05-21 DIAGNOSIS — E039 Hypothyroidism, unspecified: Secondary | ICD-10-CM | POA: Insufficient documentation

## 2022-05-21 DIAGNOSIS — I10 Essential (primary) hypertension: Secondary | ICD-10-CM | POA: Insufficient documentation

## 2022-05-21 LAB — CBC WITH DIFFERENTIAL (CANCER CENTER ONLY)
Abs Immature Granulocytes: 0.02 10*3/uL (ref 0.00–0.07)
Basophils Absolute: 0 10*3/uL (ref 0.0–0.1)
Basophils Relative: 1 %
Eosinophils Absolute: 0.1 10*3/uL (ref 0.0–0.5)
Eosinophils Relative: 2 %
HCT: 41 % (ref 36.0–46.0)
Hemoglobin: 14.8 g/dL (ref 12.0–15.0)
Immature Granulocytes: 0 %
Lymphocytes Relative: 26 %
Lymphs Abs: 1.5 10*3/uL (ref 0.7–4.0)
MCH: 31.8 pg (ref 26.0–34.0)
MCHC: 36.1 g/dL — ABNORMAL HIGH (ref 30.0–36.0)
MCV: 88 fL (ref 80.0–100.0)
Monocytes Absolute: 0.4 10*3/uL (ref 0.1–1.0)
Monocytes Relative: 7 %
Neutro Abs: 3.5 10*3/uL (ref 1.7–7.7)
Neutrophils Relative %: 64 %
Platelet Count: 157 10*3/uL (ref 150–400)
RBC: 4.66 MIL/uL (ref 3.87–5.11)
RDW: 12.9 % (ref 11.5–15.5)
WBC Count: 5.5 10*3/uL (ref 4.0–10.5)
nRBC: 0 % (ref 0.0–0.2)

## 2022-05-21 LAB — COMPREHENSIVE METABOLIC PANEL
ALT: 13 U/L (ref 0–44)
AST: 17 U/L (ref 15–41)
Albumin: 4.8 g/dL (ref 3.5–5.0)
Alkaline Phosphatase: 95 U/L (ref 38–126)
Anion gap: 7 (ref 5–15)
BUN: 24 mg/dL — ABNORMAL HIGH (ref 8–23)
CO2: 25 mmol/L (ref 22–32)
Calcium: 9.4 mg/dL (ref 8.9–10.3)
Chloride: 107 mmol/L (ref 98–111)
Creatinine, Ser: 0.78 mg/dL (ref 0.44–1.00)
GFR, Estimated: >60 mL/min (ref >60)
Glucose, Bld: 115 mg/dL — ABNORMAL HIGH (ref 70–99)
Potassium: 3.7 mmol/L (ref 3.5–5.1)
Sodium: 139 mmol/L (ref 135–145)
Total Bilirubin: 0.8 mg/dL (ref 0.3–1.2)
Total Protein: 8.5 g/dL — ABNORMAL HIGH (ref 6.5–8.1)

## 2022-05-21 LAB — TSH: TSH: 1.258 u[IU]/mL (ref 0.350–4.500)

## 2022-05-21 MED ORDER — IOHEXOL 300 MG/ML  SOLN
100.0000 mL | Freq: Once | INTRAMUSCULAR | Status: AC | PRN
  Administered 2022-05-21: 100 mL via INTRAVENOUS

## 2022-05-24 ENCOUNTER — Encounter: Payer: Self-pay | Admitting: Hematology and Oncology

## 2022-05-24 ENCOUNTER — Other Ambulatory Visit: Payer: Self-pay

## 2022-05-24 ENCOUNTER — Inpatient Hospital Stay (HOSPITAL_BASED_OUTPATIENT_CLINIC_OR_DEPARTMENT_OTHER): Payer: Medicare Other | Admitting: Hematology and Oncology

## 2022-05-24 DIAGNOSIS — I10 Essential (primary) hypertension: Secondary | ICD-10-CM | POA: Diagnosis not present

## 2022-05-24 DIAGNOSIS — K5909 Other constipation: Secondary | ICD-10-CM

## 2022-05-24 DIAGNOSIS — C541 Malignant neoplasm of endometrium: Secondary | ICD-10-CM | POA: Diagnosis not present

## 2022-05-24 DIAGNOSIS — Z8542 Personal history of malignant neoplasm of other parts of uterus: Secondary | ICD-10-CM | POA: Diagnosis present

## 2022-05-24 NOTE — Assessment & Plan Note (Signed)
Her blood pressure is typically normal at home but elevated today likely due to anxiety Observe closely for now

## 2022-05-24 NOTE — Progress Notes (Signed)
[facility] OFFICE PROGRESS NOTE  Patient Care Team: Margretta Sidle, MD as PCP - General  ASSESSMENT & PLAN:  Endometrial cancer [facility]) I reviewed her CT imaging which show no evidence of disease The patient has discontinue immunotherapy for some time and she had normal imaging study for some time I plan to see her in a year for further follow-up and repeat imaging study She will see GYN surgeon in 6 months for pelvic exam and follow-up She is educated to watch out for signs and symptoms of disease recurrence  Essential hypertension Her blood pressure is typically normal at home but elevated today likely due to anxiety Observe closely for now  Other constipation She has chronic constipation We discussed the importance of regular laxatives  Orders Placed This Encounter  Procedures   CT ABDOMEN PELVIS W CONTRAST    Standing Status:   Future    Standing Expiration Date:   05/25/2023    Order Specific Question:   If indicated for the ordered procedure, I authorize the administration of contrast media per Radiology protocol    Answer:   Yes    Order Specific Question:   Preferred imaging location?    Answer:   [facility]    Order Specific Question:   Radiology Contrast Protocol - do NOT remove file path    Answer:   [email or web address]   CMP ([facility] only)    Standing Status:   Future    Standing Expiration Date:   05/25/2023   CBC with Differential (Cancer Center Only)    Standing Status:   Future    Standing Expiration Date:   05/25/2023    All questions were answered. The patient knows to call the clinic with any problems, questions or concerns. The total time spent in the appointment was 20 minutes encounter with patients including review of chart and various tests results, discussions about plan of care and coordination of care plan   [name], MD 05/24/2022 11:35 AM  INTERVAL HISTORY: Please see below  for problem oriented charting. she returns for review of test results with her husband She has significant loose stool after CT imaging She has history of chronic constipation  REVIEW OF SYSTEMS:   Constitutional: Denies fevers, chills or abnormal weight loss Eyes: Denies blurriness of vision Ears, nose, mouth, throat, and face: Denies mucositis or sore throat Respiratory: Denies cough, dyspnea or wheezes Cardiovascular: Denies palpitation, chest discomfort or lower extremity swelling Skin: Denies abnormal skin rashes Lymphatics: Denies new lymphadenopathy or easy bruising Neurological:Denies numbness, tingling or new weaknesses Behavioral/Psych: Mood is stable, no new changes  All other systems were reviewed with the patient and are negative.  I have reviewed the past medical history, past surgical history, social history and family history with the patient and they are unchanged from previous note.  ALLERGIES:  is allergic to iron, iron dextran, penicillins, amoxicillin, ampicillin, codeine, diamox [acetazolamide], penicillin g, and other.  MEDICATIONS:  Current Outpatient Medications  Medication Sig Dispense Refill   amLODipine (NORVASC) 10 MG tablet Take by mouth.     atorvastatin (LIPITOR) 20 MG tablet Take 1 tablet (20 mg total) by mouth daily. 30 tablet 3   benazepril (LOTENSIN) 20 MG tablet Take 20 mg by mouth daily.     brimonidine (ALPHAGAN) 0.2 % ophthalmic solution      busPIRone (BUSPAR) 7.5 MG tablet Take 7.5 mg by mouth 2 (two) times daily.     dorzolamide-timolol (COSOPT) 22.3-6.8 MG/ML ophthalmic solution  Place 1 drop into the left eye 2 (two) times daily.     hydrochlorothiazide (HYDRODIURIL) 25 MG tablet Take 1 tablet (25 mg total) by mouth daily. Discontinue amlodipine 90 tablet 1   latanoprost (XALATAN) 0.005 % ophthalmic solution Apply to eye.     magnesium 30 MG tablet Take by mouth daily.     metoprolol tartrate (LOPRESSOR) 25 MG tablet Take 1 tablet (25 mg  total) by mouth 2 (two) times daily. 180 tablet 1   metoprolol tartrate (LOPRESSOR) 25 MG tablet 25 mg.     Netarsudil Dimesylate 0.02 % SOLN Apply to eye.     ondansetron (ZOFRAN) 8 MG tablet Take 1 tablet (8 mg total) by mouth every 8 (eight) hours as needed for nausea. 30 tablet 3   prednisoLONE acetate (PRED FORTE) 1 % ophthalmic suspension Apply to eye.     prochlorperazine (COMPAZINE) 10 MG tablet Take 1 tablet (10 mg total) by mouth every 6 (six) hours as needed for nausea or vomiting. 30 tablet 0   VYZULTA 0.024 % SOLN      No current facility-administered medications for this visit.    SUMMARY OF ONCOLOGIC HISTORY: Oncology History Overview Note  MMR - Abnormal   Endometrial cancer ([place])  06/01/2013 Initial Diagnosis   Endometrial cancer   06/01/2013 Initial Biopsy   EMB for AUB Endometrium, biopsy - POSITIVE FOR ENDOMETRIAL ADENOCARCINOMA, SEE COMMENT. Microscopic Comment As sampled the endometrial adenocarcinoma appears to be endometrioid type, FIGO Grade I.   07/21/2013 Surgery   PREOPERATIVE DIAGNOSIS: Grade 1 endometrial carcinoma. Uterine fibroids    PROCEDURE: Total abdominal hysterectomy, bilateral salpingo-oophorectomy, pelvic lymphadenectomy   SURGEON: [name], M.D  SURGICAL FINDINGS: At the time of exploratory laparotomy the uterus was approximately 16-18 weeks size with multiple subserosal fibroids. Tubes and ovaries were normal. On frozen section patient had endometrial carcinoma invading only the inner half of the myometrium. Exploration the upper abdomen revealed no evidence of metastatic disease. There was no pelvic or para-aortic lymphadenopathy.     07/21/2013 Pathologic Stage   1. Uterus +/- tubes/ovaries, neoplastic - INVASIVE ENDOMETRIOID CARCINOMA, FIGO GRADE II, WITH SQUAMOUS DIFFERENTIATION, CONFINED WITHIN INNER HALF OF THE MYOMETRIUM. - LEIOMYOMATA AND ADENOMYOSIS. - CERVIX: BENIGN SQUAMOUS MUCOSA AND ENDOCERVICAL MUCOSA, NO  DYSPLASIA OR MALIGNANCY. - BILATERAL OVARIES AND FALLOPIAN TUBES: NO PATHOLOGIC ABNORMALITIES. 2. Lymph nodes, regional resection, left pelvic - FOUR LYMPH NODES, NO EVIDENCE OF METASTATIC CARCINOMA (0/4). 3. Lymph nodes, regional resection, right pelvic - THREE LYMPH NODES, NEGATIVE FOR METASTATIC CARCINOMA (0/3). Microscopic Comment 1. ONCOLOGY TABLE - UTERUS, CARCINOMA OR CARCINOSARCOMA Specimen: Uterus, cervix, bilateral ovaries and fallopian tubes Procedure: Total hysterectomy and bilateral salpingo-oophorectomy Lymph node sampling performed: Yes Specimen integrity: Intact Maximum tumor size (cm): 2.4 cm, gross measurement Histologic type: Invasive endometrioid carcinoma with squamous differentiation Grade: FIGO II Myometrial invasion: 1.1 cm where myometrium is 3.7 cm in thickness Cervical stromal involvement: Not identified Extent of involvement of other organs: No Lymph vascular invasion: Not identified Peritoneal washings: Negative (Please correlate with [id number]) Lymph nodes: number examined 7; number positive 0 Pelvic lymph nodes: 0 involved of 7 lymph nodes Para-aortic lymph nodes: N/A TNM code: pT1a, pN0 FIGO Stage (based on pathologic findings, needs clinical correlation): IA   09/22/2019 Imaging   CT A/P: There is a left paraspinous mass which is poorly defined and low-density measuring 3 x 2.2 by 5.5 cm. Deviates the left ureter laterally. No additional periaortic masses are identified.   10/13/2019 Pathology Results  A. LYMPH NODE, LEFT PARASPINAL, NEEDLE CORE BIOPSY: - Poorly differentiated carcinoma. COMMENT: Poorly differentiated carcinoma is present in the background of a majority of necrosis and fibrosis tissue. No distinct nodal tissue is identified. Immunohistochemistry for CK7, PAX 8 and ER is positive. CK20, TTF-1, CDX-2 and GATA-3 are negative. The immunophenotype is compatible with the provided clinical history of gynecologic cancer.   10/19/2019 Cancer  Staging   Staging form: Corpus Uteri - Carcinoma and Carcinosarcoma, AJCC 8th Edition - Pathologic: Stage IIIC2 (pT1a, pN2a, cM0) - Signed by [name], MD on 10/29/2019    Genetic Testing   Patient has genetic testing done for MMR. Results revealed patient has the following mutation(s): MMR - Abnormal   10/27/2019 PET scan   1. Poorly marginated hypermetabolic nodal conglomerate in the left para-aortic and left common iliac chains, compatible with nodal metastatic disease. 2. No additional hypermetabolic sites of metastatic disease. 3. Right upper lobe 2 mm solid pulmonary nodule, below PET resolution, recommend attention on follow-up chest CT in 3-6 months. 4. Chronic findings include: Aortic Atherosclerosis (ICD10-I70.0). Coronary atherosclerosis. Mild left colonic diverticulosis. Mild biliary and pancreatic duct dilation of uncertain etiology, unchanged since 09/22/2019 CT.   11/10/2019 - 06/08/2021 Chemotherapy   Patient is on Treatment Plan : UTERINE Pembrolizumab q42d      02/22/2020 PET scan   1. Significant partial metabolic response. Left para-aortic and left common iliac adenopathy is decreased in size and significantly decreased in metabolism. 2. No new or progressive hypermetabolic metastatic disease. 3. Aortic Atherosclerosis (ICD10-I70.0). Additional chronic findings as detailed.   05/16/2020 Imaging   Further decrease in mild left paraaortic and left common iliac lymphadenopathy.   No new or progressive disease within the abdomen or pelvis.   Aortic Atherosclerosis (ICD10-I70.0).     09/19/2020 Imaging   1. Mild response to therapy as evidenced by decrease in abdominopelvic adenopathy. 2. No new sites of disease. 3. Hysterectomy. 4.  Aortic Atherosclerosis (ICD10-I70.0).     01/31/2021 Imaging   1. Stable exam. Unchanged appearance borderline abdominopelvic adenopathy. 2. No new sites of disease. 3. Status post hysterectomy with stable, mild soft tissue thickening  along the right side of vaginal cuff. 4.  Aortic Atherosclerosis (ICD10-I70.0).   07/20/2021 Imaging   Continued decrease in size of LEFT periaortic/common iliac soft tissue and post treatment changes.   No new or progressive findings.   Stable common bile duct dilation, up to 11 mm. Intrahepatic biliary duct distension similar to previous imaging. Correlate with any symptoms that would necessitate further workup but this finding is unchanged dating back to December of 2020.   Aortic Atherosclerosis (ICD10-I70.0).   11/18/2021 Imaging   1. Status post hysterectomy. No evidence of pelvic mass or discrete lymphadenopathy. 2. Post treatment appearance of soft tissue at the left aspect of the inferior aorta and left common iliac artery is unchanged. 3. Unchanged common bile duct dilatation up to 1.0 cm with associated intrahepatic biliary ductal dilatation. No gallstones or gallbladder wall thickening. In the absence of signs and symptoms of cholestasis, this is of doubtful clinical significance given stability.   Aortic Atherosclerosis (ICD10-I70.0).   05/21/2022 Imaging   Stable exam. No evidence of recurrent or metastatic carcinoma within the abdomen or pelvis.   Aortic Atherosclerosis (ICD10-I70.0).     PHYSICAL EXAMINATION: ECOG PERFORMANCE STATUS: 0 - Asymptomatic  Vitals:   05/24/22 1124  BP: (!) 176/104  Pulse: (!) 106  Resp: 18  Temp: 97.7 F (36.5 C)  SpO2: 100%  Filed Weights   05/24/22 1124  Weight: 165 lb 12.8 oz (75.2 kg)    GENERAL:alert, no distress and comfortable NEURO: alert & oriented x 3 with fluent speech, no focal motor/sensory deficits  LABORATORY DATA:  I have reviewed the data as listed    Component Value Date/Time   NA 139 05/21/2022 1113   NA 139 03/03/2019 0934   NA 142 07/09/2013 1144   K 3.7 05/21/2022 1113   K 3.7 07/09/2013 1144   CL 107 05/21/2022 1113   CO2 25 05/21/2022 1113   CO2 23 07/09/2013 1144   GLUCOSE 115 (H) 05/21/2022  1113   GLUCOSE 115 07/09/2013 1144   BUN 24 (H) 05/21/2022 1113   BUN 25 03/03/2019 0934   BUN 14.4 07/09/2013 1144   CREATININE 0.78 05/21/2022 1113   CREATININE 0.90 11/16/2021 1104   CREATININE 0.82 01/02/2017 1159   CREATININE 0.8 07/09/2013 1144   CALCIUM 9.4 05/21/2022 1113   CALCIUM 9.3 07/09/2013 1144   PROT 8.5 (H) 05/21/2022 1113   PROT 8.0 03/03/2019 0934   PROT 7.8 07/09/2013 1144   ALBUMIN 4.8 05/21/2022 1113   ALBUMIN 4.7 03/03/2019 0934   ALBUMIN 3.7 07/09/2013 1144   AST 17 05/21/2022 1113   AST 16 11/16/2021 1104   AST 9 07/09/2013 1144   ALT 13 05/21/2022 1113   ALT 14 11/16/2021 1104   ALT <6 07/09/2013 1144   ALKPHOS 95 05/21/2022 1113   ALKPHOS 58 07/09/2013 1144   BILITOT 0.8 05/21/2022 1113   BILITOT 0.8 11/16/2021 1104   BILITOT 0.40 07/09/2013 1144   GFRNONAA >60 05/21/2022 1113   GFRNONAA >60 11/16/2021 1104   GFRNONAA 74 02/01/2015 1238   GFRAA >60 06/28/2020 1031   GFRAA 85 02/01/2015 1238    No results found for: "SPEP", "UPEP"  Lab Results  Component Value Date   WBC 5.5 05/21/2022   NEUTROABS 3.5 05/21/2022   HGB 14.8 05/21/2022   HCT 41.0 05/21/2022   MCV 88.0 05/21/2022   PLT 157 05/21/2022      Chemistry      Component Value Date/Time   NA 139 05/21/2022 1113   NA 139 03/03/2019 0934   NA 142 07/09/2013 1144   K 3.7 05/21/2022 1113   K 3.7 07/09/2013 1144   CL 107 05/21/2022 1113   CO2 25 05/21/2022 1113   CO2 23 07/09/2013 1144   BUN 24 (H) 05/21/2022 1113   BUN 25 03/03/2019 0934   BUN 14.4 07/09/2013 1144   CREATININE 0.78 05/21/2022 1113   CREATININE 0.90 11/16/2021 1104   CREATININE 0.82 01/02/2017 1159   CREATININE 0.8 07/09/2013 1144      Component Value Date/Time   CALCIUM 9.4 05/21/2022 1113   CALCIUM 9.3 07/09/2013 1144   ALKPHOS 95 05/21/2022 1113   ALKPHOS 58 07/09/2013 1144   AST 17 05/21/2022 1113   AST 16 11/16/2021 1104   AST 9 07/09/2013 1144   ALT 13 05/21/2022 1113   ALT 14 11/16/2021 1104    ALT <6 07/09/2013 1144   BILITOT 0.8 05/21/2022 1113   BILITOT 0.8 11/16/2021 1104   BILITOT 0.40 07/09/2013 1144       RADIOGRAPHIC STUDIES: I have personally reviewed the radiological images as listed and agreed with the findings in the report. CT ABDOMEN PELVIS W CONTRAST  Result Date: 05/21/2022 CLINICAL DATA:  Follow-up endometrial carcinoma. Previous surgery and chemotherapy. * Tracking Code: BO * EXAM: CT ABDOMEN AND PELVIS WITH CONTRAST TECHNIQUE: Multidetector CT imaging  of the abdomen and pelvis was performed using the standard protocol following bolus administration of intravenous contrast. RADIATION DOSE REDUCTION: This exam was performed according to the departmental dose-optimization program which includes automated exposure control, adjustment of the mA and/or kV according to patient size and/or use of iterative reconstruction technique. CONTRAST:  126m OMNIPAQUE IOHEXOL 300 MG/ML  SOLN COMPARISON:  11/16/2021 FINDINGS: Lower Chest: No acute findings. Hepatobiliary: No hepatic masses identified. A few small benign right hepatic lobe cysts remains stable. Gallbladder is unremarkable. No evidence of biliary obstruction. Pancreas:  No mass or inflammatory changes. Spleen: Within normal limits in size and appearance. Adrenals/Urinary Tract: No masses identified. No evidence of ureteral calculi or hydronephrosis. Stomach/Bowel: No evidence of obstruction, inflammatory process or abnormal fluid collections. Normal appendix visualized. Vascular/Lymphatic: No pathologically enlarged lymph nodes. Plaque-like soft tissue density in the left paraaortic region is stable, consistent with treated metastatic disease. Aortic atherosclerotic calcification incidentally noted. No acute vascular findings. Reproductive: Prior hysterectomy noted. Adnexal regions are unremarkable in appearance. No evidence of recurrent pelvic mass or ascites. Other:  None. Musculoskeletal:  No suspicious bone lesions  identified. IMPRESSION: Stable exam. No evidence of recurrent or metastatic carcinoma within the abdomen or pelvis. Aortic Atherosclerosis (ICD10-I70.0). Electronically Signed   By: JMarlaine HindM.D.   On: 05/21/2022 16:47

## 2022-05-24 NOTE — Assessment & Plan Note (Signed)
She has chronic constipation We discussed the importance of regular laxatives

## 2022-05-24 NOTE — Assessment & Plan Note (Signed)
I reviewed her CT imaging which show no evidence of disease The patient has discontinue immunotherapy for some time and she had normal imaging study for some time I plan to see her in a year for further follow-up and repeat imaging study She will see GYN surgeon in 6 months for pelvic exam and follow-up She is educated to watch out for signs and symptoms of disease recurrence

## 2022-08-30 ENCOUNTER — Telehealth: Payer: Self-pay | Admitting: *Deleted

## 2022-08-30 NOTE — Telephone Encounter (Signed)
Spoke with the patient and scheduled a follow up appt with Dr Delsa Sale on 2/14 at 9:15 am

## 2022-11-19 ENCOUNTER — Telehealth: Payer: Self-pay

## 2022-11-19 NOTE — Telephone Encounter (Signed)
Pt called this morning to reschedule her upcoming appointment on 2/14 with Dr.Jackson-Moore. She is rescheduled for 3/6 @ 11:00

## 2022-11-20 ENCOUNTER — Ambulatory Visit (HOSPITAL_BASED_OUTPATIENT_CLINIC_OR_DEPARTMENT_OTHER): Payer: Medicare Other | Admitting: Family Medicine

## 2022-11-28 ENCOUNTER — Ambulatory Visit: Payer: Medicare Other | Admitting: Obstetrics & Gynecology

## 2022-12-18 ENCOUNTER — Encounter: Payer: Self-pay | Admitting: Obstetrics & Gynecology

## 2022-12-19 ENCOUNTER — Inpatient Hospital Stay: Payer: Medicare Other | Attending: Obstetrics & Gynecology | Admitting: Obstetrics & Gynecology

## 2022-12-19 ENCOUNTER — Encounter: Payer: Self-pay | Admitting: Obstetrics & Gynecology

## 2022-12-19 ENCOUNTER — Inpatient Hospital Stay: Payer: Medicare Other

## 2022-12-19 VITALS — BP 159/95 | HR 109 | Temp 98.8°F | Resp 16 | Ht 66.0 in | Wt 169.0 lb

## 2022-12-19 DIAGNOSIS — C541 Malignant neoplasm of endometrium: Secondary | ICD-10-CM

## 2022-12-19 DIAGNOSIS — Z8542 Personal history of malignant neoplasm of other parts of uterus: Secondary | ICD-10-CM | POA: Diagnosis not present

## 2022-12-19 NOTE — Patient Instructions (Addendum)
Return in 1 year. Please call back at the beginning of 2025 to schedule an appointment in March.   You may call [phone] with any questions or concerns.

## 2022-12-19 NOTE — Progress Notes (Signed)
Follow Up Note: Gyn-Onc  Stephanie [name] 77 y.o. female  CC: She presents for a follow-up visit   HPI: The oncology history was reviewed.  Interval History: At the last visit there was a paraspinous mass that was discovered on imaging that was suspicious for a possible disease recurrence.  A needle core biopsy of the lesion was confirmatory.  She completed systemic therapy in August 2022.  She was recently seen in follow-up by Dr. Alvy Bimler in 8/23 and was felt to be clinically without evidence of disease.  Review of Systems  Review of Systems  Constitutional:  Negative for malaise/fatigue and weight loss.  Respiratory:  Negative for shortness of breath and wheezing.   Cardiovascular:  Negative for chest pain and leg swelling.  Gastrointestinal:  Negative for abdominal pain, blood in stool, constipation, nausea and vomiting.  Genitourinary:  Negative for dysuria, frequency, hematuria and urgency.  Musculoskeletal:  Negative for joint pain and myalgias.  Neurological:  Negative for weakness.  Psychiatric/Behavioral:  Negative for depression. The patient does not have insomnia.    Current medications, allergy, social history, past surgical history, past medical history, family history were all reviewed.    Vitals:  Blood pressure (!) 159/95, pulse (!) 109, temperature 98.8 F (37.1 C), temperature source Tympanic, resp. rate 16, height '5\' 6"'$  (1.676 m), weight 169 lb (76.7 kg), SpO2 100 %.  Physical Exam:  Physical Exam Exam conducted with a chaperone present.  Constitutional:      General: She is not in acute distress. Cardiovascular:     Rate and Rhythm: Normal rate and regular rhythm.  Pulmonary:     Effort: Pulmonary effort is normal.     Breath sounds: Normal breath sounds. No wheezing or rhonchi.  Abdominal:     Palpations: Abdomen is soft.     Tenderness: There is no abdominal tenderness. There is no right CVA tenderness or left CVA tenderness.     Hernia: No hernia is  present.  Genitourinary:    General: Normal vulva.     Urethra: No urethral lesion.     Vagina: No lesions. No bleeding Musculoskeletal:     Cervical back: Neck supple.     Right lower leg: No edema.     Left lower leg: No edema.  Lymphadenopathy:     Upper Body:     Right upper body: No supraclavicular adenopathy.     Left upper body: No supraclavicular adenopathy.     Lower Body: No right inguinal adenopathy. No left inguinal adenopathy.  Skin:    Findings: No rash.  Neurological:     Mental Status: She is oriented to person, place, and time.   Assessment/Plan:  Endometrial cancer Stephanie [name]) Stephanie [name] is a 34 y.o. woman with a history of recurrent endometrial cancer Negative symptom review, normal exam.     > Continue every 6 month surveillance visits.  She will see Dr. Alvy Bimler in 6 months and return to this clinic in 1 year    I personally spent 25 minutes face-to-face and non-face-to-face in the care of this patient, which includes all pre, intra, and post visit time on the date of service.    Lahoma Crocker, MD

## 2022-12-19 NOTE — Assessment & Plan Note (Addendum)
Stephanie [name] is a 77 y.o. woman with a history of recurrent endometrial cancer Negative symptom review, normal exam.     > Continue every 6 month surveillance visits.  She will see Dr. Alvy Bimler in 6 months and return to this clinic in 1 year

## 2023-01-09 ENCOUNTER — Ambulatory Visit (HOSPITAL_BASED_OUTPATIENT_CLINIC_OR_DEPARTMENT_OTHER): Payer: Medicare Other | Admitting: Family Medicine

## 2023-01-09 ENCOUNTER — Encounter (HOSPITAL_BASED_OUTPATIENT_CLINIC_OR_DEPARTMENT_OTHER): Payer: Self-pay

## 2023-01-10 ENCOUNTER — Ambulatory Visit (HOSPITAL_BASED_OUTPATIENT_CLINIC_OR_DEPARTMENT_OTHER): Payer: Medicare Other | Admitting: Family Medicine

## 2023-02-06 ENCOUNTER — Encounter (HOSPITAL_BASED_OUTPATIENT_CLINIC_OR_DEPARTMENT_OTHER): Payer: Self-pay | Admitting: Family Medicine

## 2023-02-06 ENCOUNTER — Ambulatory Visit (INDEPENDENT_AMBULATORY_CARE_PROVIDER_SITE_OTHER): Payer: Medicare Other | Admitting: Family Medicine

## 2023-02-06 VITALS — BP 168/105 | HR 114 | Ht 66.0 in | Wt 169.0 lb

## 2023-02-06 DIAGNOSIS — M25569 Pain in unspecified knee: Secondary | ICD-10-CM | POA: Diagnosis not present

## 2023-02-06 DIAGNOSIS — F419 Anxiety disorder, unspecified: Secondary | ICD-10-CM

## 2023-02-06 DIAGNOSIS — I10 Essential (primary) hypertension: Secondary | ICD-10-CM | POA: Diagnosis not present

## 2023-02-06 DIAGNOSIS — G8929 Other chronic pain: Secondary | ICD-10-CM | POA: Diagnosis not present

## 2023-02-06 DIAGNOSIS — Z7689 Persons encountering health services in other specified circumstances: Secondary | ICD-10-CM

## 2023-02-06 DIAGNOSIS — E782 Mixed hyperlipidemia: Secondary | ICD-10-CM

## 2023-02-06 DIAGNOSIS — Z0000 Encounter for general adult medical examination without abnormal findings: Secondary | ICD-10-CM

## 2023-02-06 MED ORDER — AMLODIPINE BESYLATE 5 MG PO TABS: 5.0000 mg | ORAL_TABLET | Freq: Two times a day (BID) | ORAL | 1 refills | Status: DC

## 2023-02-06 MED ORDER — METOPROLOL TARTRATE 25 MG PO TABS: 25.0000 mg | ORAL_TABLET | Freq: Two times a day (BID) | ORAL | 1 refills | Status: DC

## 2023-02-06 MED ORDER — BUSPIRONE HCL 7.5 MG PO TABS: 7.5000 mg | ORAL_TABLET | Freq: Two times a day (BID) | ORAL | 1 refills | Status: DC

## 2023-02-06 MED ORDER — ATORVASTATIN CALCIUM 20 MG PO TABS: 20.0000 mg | ORAL_TABLET | Freq: Every day | ORAL | 1 refills | Status: DC

## 2023-02-06 MED ORDER — BENAZEPRIL HCL 20 MG PO TABS: 20.0000 mg | ORAL_TABLET | Freq: Every day | ORAL | 1 refills | Status: DC

## 2023-02-06 NOTE — Progress Notes (Signed)
New Patient Office Visit  Subjective    Patient ID: Stephanie [name], female    DOB: 06-Sep-1946  Age: 77 y.o. MRN: [id number]  HPI Stephanie [name] is a 77 yo female patient who presents to establish care  Last PCP in November. Her insurance did not cover her previous provider any longer and would like to establish care here.   GYN/ONC- once a year Dr. Tamela Oddi  Ophthalmology- Had surgery for glaucoma and cataracts at [facility]  Legally blind  HTN- patient has severe white coat-HTN. Her at home blood pressure readings include:  124/75 HR 75 123/74       80 120/78       78 125/72       68 119/73       68 120/79       76 125/79       76 116/76       80 127/82       76 127/76       71  Knee pain-  does not take any medications and does not want any medications. Would like to see PT  Anxiety- buspar 7.5mg , mood well controlled    Outpatient Encounter Medications as of 02/06/2023  Medication Sig   brimonidine (ALPHAGAN) 0.2 % ophthalmic solution    dorzolamide-timolol (COSOPT) 22.3-6.8 MG/ML ophthalmic solution Place 1 drop into the left eye 2 (two) times daily.   ROCKLATAN 0.02-0.005 % SOLN Apply to eye.   [DISCONTINUED] amLODipine (NORVASC) 5 MG tablet Take 5 mg by mouth 2 (two) times daily.   [DISCONTINUED] atorvastatin (LIPITOR) 20 MG tablet Take 1 tablet (20 mg total) by mouth daily.   [DISCONTINUED] benazepril (LOTENSIN) 20 MG tablet Take 20 mg by mouth daily.   [DISCONTINUED] busPIRone (BUSPAR) 7.5 MG tablet Take 7.5 mg by mouth 2 (two) times daily.   [DISCONTINUED] metoprolol tartrate (LOPRESSOR) 25 MG tablet Take 1 tablet (25 mg total) by mouth 2 (two) times daily.   amLODipine (NORVASC) 5 MG tablet Take 1 tablet (5 mg total) by mouth 2 (two) times daily.   atorvastatin (LIPITOR) 20 MG tablet Take 1 tablet (20 mg total) by mouth daily.   benazepril (LOTENSIN) 20 MG tablet Take 1 tablet (20 mg total) by mouth daily.   busPIRone (BUSPAR) 7.5 MG tablet Take 1  tablet (7.5 mg total) by mouth 2 (two) times daily.   metoprolol tartrate (LOPRESSOR) 25 MG tablet Take 1 tablet (25 mg total) by mouth 2 (two) times daily.   [DISCONTINUED] hydrochlorothiazide (HYDRODIURIL) 25 MG tablet Take 1 tablet (25 mg total) by mouth daily. Discontinue amlodipine (Patient not taking: Reported on 02/06/2023)   No facility-administered encounter medications on file as of 02/06/2023.    Past Medical History:  Diagnosis Date   Anemia    Anxiety    Blindness, legal    Cataract    Complication of anesthesia    Difficulty sleeping    Endometrial cancer    Family history of liver cancer    Family history of lung cancer    Fibroids    Glaucoma    History of transfusion    Hypertension    PNA (pneumonia)    PONV (postoperative nausea and vomiting)    Vaginal bleeding     Past Surgical History:  Procedure Laterality Date   ABDOMINAL HYSTERECTOMY N/A 07/21/2013   Procedure: TOTAL HYSTERECTOMY ABDOMINAL ;  Surgeon: Jeannette Corpus, MD;  Location: [facility];  Service: Gynecology;  Laterality: N/A;  DILATION AND CURETTAGE OF UTERUS     LAPAROTOMY N/A 07/21/2013   Procedure: EXPLORATORY LAPAROTOMY/ PELVIC LYMPHADENECTOMY   ;  Surgeon: Jeannette Corpus, MD;  Location: [facility];  Service: Gynecology;  Laterality: N/A;   SALPINGOOPHORECTOMY Bilateral 07/21/2013   Procedure: BILATERAL SALPINGO OOPHORECTOMY;  Surgeon: Jeannette Corpus, MD;  Location: [facility];  Service: Gynecology;  Laterality: Bilateral;    Family History  Problem Relation Age of Onset   Liver cancer Mother 18   Hypertension Father    Vision loss Maternal Grandmother    Lung cancer Paternal Uncle     Social History   Socioeconomic History   Marital status: Divorced    Spouse name: Not on file   Number of children: 1   Years of education: Not on file   Highest education level: Not on file  Occupational History   Not on file  Tobacco Use   Smoking status: Never   Smokeless  tobacco: Never  Substance and Sexual Activity   Alcohol use: No   Drug use: No   Sexual activity: Not Currently    Birth control/protection: None  Other Topics Concern   Not on file  Social History Narrative   Not on file   Social Determinants of Health   Financial Resource Strain: Not on file  Food Insecurity: Not on file  Transportation Needs: Not on file  Physical Activity: Not on file  Stress: Not on file  Social Connections: Not on file  Intimate Partner Violence: Not on file    Review of Systems  Constitutional:  Negative for chills, fever and malaise/fatigue.  Eyes:  Negative for blurred vision and double vision.  Respiratory:  Negative for cough and shortness of breath.   Cardiovascular:  Negative for chest pain.  Gastrointestinal:  Negative for abdominal pain, nausea and vomiting.  Musculoskeletal:  Negative for myalgias.  Neurological:  Negative for dizziness, weakness and headaches.  Psychiatric/Behavioral:  Negative for depression and suicidal ideas. The patient is not nervous/anxious.         Objective    BP (!) 168/105   Pulse (!) 114   Ht  (1.676 m)   Wt 169 lb (76.7 kg)   SpO2 100%   BMI 27.28 kg/m   Physical Exam Constitutional:      Appearance: Normal appearance.  Cardiovascular:     Rate and Rhythm: Normal rate and regular rhythm.     Pulses: Normal pulses.     Heart sounds: Normal heart sounds.  Pulmonary:     Effort: Pulmonary effort is normal.     Breath sounds: Normal breath sounds.  Neurological:     Mental Status: She is alert.  Psychiatric:        Mood and Affect: Mood normal.        Behavior: Behavior normal.        Thought Content: Thought content normal.        Judgment: Judgment normal.      Assessment & Plan:  1. Encounter to establish care Patient is here to establish care. Cardiovascular exam with heart regular rate and rhythm. Normal heart sounds, no murmurs present. No lower extremity edema present. Lungs  clear to auscultation bilaterally.  Patient well-appearing and in no acute distress. Answers all questions appropriately.   2. Uncontrolled hypertension Patient currently taking amlodipine  twice daily, benazepril  daily, and metoprolol tartrate  twice daily. Denies medication side effects. Denies chest pain, palpitations, changes in vision, shortness of breath, lower  extremity edema, headaches, lightheadedness, weakness, cough.  Blood pressure is well controlled at home. Continue current medication regimen. Refills provided.   3. Wellness examination Plan for physical exam in November. Lab orders placed for future.  - CBC with Differential/Platelet; Future - Comprehensive metabolic panel; Future - Hemoglobin A1c; Future - Lipid panel; Future - TSH Rfx on Abnormal to Free T4; Future  4. Anxiety GAD 7 completed. Patient reports anxiety is well controlled, except "on the way to the doctor's office." Denies medication side effects. Continue with current regimen buspar 7.5mg  twice daily. Refill provided.   5. Mixed hyperlipidemia Patient currently taking  daily. Denies medication side effects. Continue current regimen. Refill provided.    6. Chronic knee pain, unspecified laterality Patient reports having chronic knee pain. Denies past or recent injury/trauma. She would like referral to physical therapy.  - Ambulatory referral to Physical Therapy   Return in about 7 months (around 08/30/2023) for Physical with fasting labs.   Alyson Reedy, FNP

## 2023-02-11 ENCOUNTER — Telehealth: Payer: Self-pay | Admitting: Family Medicine

## 2023-02-11 NOTE — Telephone Encounter (Signed)
Contacted Stephanie [name] to schedule their annual wellness visit. Appointment made for 02/26/2023.  Thank you,  Judeth Cornfield,  AMB Clinical Support [facility] Direct Dial ??[phone]

## 2023-02-22 ENCOUNTER — Ambulatory Visit: Payer: Medicare Other | Attending: Family Medicine | Admitting: Physical Therapy

## 2023-02-22 ENCOUNTER — Encounter: Payer: Self-pay | Admitting: Physical Therapy

## 2023-02-22 ENCOUNTER — Other Ambulatory Visit: Payer: Self-pay

## 2023-02-22 DIAGNOSIS — M25561 Pain in right knee: Secondary | ICD-10-CM | POA: Insufficient documentation

## 2023-02-22 DIAGNOSIS — R2689 Other abnormalities of gait and mobility: Secondary | ICD-10-CM | POA: Diagnosis not present

## 2023-02-22 DIAGNOSIS — G8929 Other chronic pain: Secondary | ICD-10-CM | POA: Diagnosis not present

## 2023-02-22 DIAGNOSIS — M6281 Muscle weakness (generalized): Secondary | ICD-10-CM | POA: Insufficient documentation

## 2023-02-22 DIAGNOSIS — M25569 Pain in unspecified knee: Secondary | ICD-10-CM | POA: Diagnosis not present

## 2023-02-22 NOTE — Therapy (Signed)
OUTPATIENT PHYSICAL THERAPY LOWER EXTREMITY EVALUATION   Patient Name: Stephanie [name] MRN: [id number] DOB:[birth year], 77 y.o., female Today's Date: 02/22/2023  END OF SESSION:  PT End of Session - 02/22/23 1104     Visit Number 1    Authorization Type [employer] Medicare    PT Start Time 1104    PT Stop Time 1145    PT Time Calculation (min) 41 min    Activity Tolerance Patient tolerated treatment well             Past Medical History:  Diagnosis Date   Anemia    Anxiety    Blindness, legal    Cataract    Complication of anesthesia    Difficulty sleeping    Endometrial cancer (HCC)    Family history of liver cancer    Family history of lung cancer    Fibroids    Glaucoma    History of transfusion    Hypertension    PNA (pneumonia)    PONV (postoperative nausea and vomiting)    Vaginal bleeding    Past Surgical History:  Procedure Laterality Date   ABDOMINAL HYSTERECTOMY N/A 07/21/2013   Procedure: TOTAL HYSTERECTOMY ABDOMINAL ;  Surgeon: Jeannette Corpus, MD;  Location: [facility];  Service: Gynecology;  Laterality: N/A;   DILATION AND CURETTAGE OF UTERUS     LAPAROTOMY N/A 07/21/2013   Procedure: EXPLORATORY LAPAROTOMY/ PELVIC LYMPHADENECTOMY   ;  Surgeon: Jeannette Corpus, MD;  Location: [facility];  Service: Gynecology;  Laterality: N/A;   SALPINGOOPHORECTOMY Bilateral 07/21/2013   Procedure: BILATERAL SALPINGO OOPHORECTOMY;  Surgeon: Jeannette Corpus, MD;  Location: [facility];  Service: Gynecology;  Laterality: Bilateral;   Patient Active Problem List   Diagnosis Date Noted   Anxiety 02/06/2023   Mixed hyperlipidemia 02/06/2023   Chronic knee pain 02/06/2023   Hypokalemia 11/10/2020   Thrombocytopenia (HCC) 08/09/2020   Poor venous access 06/28/2020   Preventive measure 06/28/2020   Leukopenia 02/23/2020   Essential hypertension 01/12/2020   Genetic testing 11/24/2019   Goals of care, counseling/discussion 10/29/2019   Other constipation  10/29/2019   Family history of lung cancer    Family history of liver cancer    Uncontrolled hypertension 02/01/2015   Tachycardia 02/01/2015   Endometrial cancer (HCC) 06/17/2013   Fibroid uterus 06/01/2013   Thickened endometrium 06/01/2013   Postmenopausal vaginal bleeding 06/01/2013   Follow up 05/22/2013   Severe uncontrolled hypertension 05/15/2013   Generalized anxiety disorder 05/15/2013   SOB (shortness of breath) 04/11/2013   Iron deficiency 04/11/2013   Glaucoma 04/11/2013   Iron deficiency anemia due to chronic blood loss 04/04/2012   Senile nuclear sclerosis 02/18/2012   Primary open-angle glaucoma 02/18/2012   Severe stage glaucoma 02/18/2012    PCP: Alyson Reedy, FNP  REFERRING PROVIDER: Alyson Reedy, FNP  REFERRING DIAG:  [phone] (ICD-10-CM) - Chronic knee pain, unspecified laterality    THERAPY DIAG:  Chronic pain of right knee  Other abnormalities of gait and mobility  Muscle weakness (generalized)  Rationale for Evaluation and Treatment: Rehabilitation  ONSET DATE: ~1 year  SUBJECTIVE:   SUBJECTIVE STATEMENT: Pt reports history of R knee injury. States it is now giving her problems. Now is noticing that it is turning out more. Is noting more popping. Will notice it with transfers. Pt wears a knee brace at times. L knee is good.  PERTINENT HISTORY: R knee injury at 18, visual impairment  PAIN:  Are you having pain? Yes: NPRS scale: 0  currently, 4 or 5 at worst/10 Pain location: R lateral portion of knee Pain description: Aching, popping, catching Aggravating factors: sit to stand, stairs Relieving factors: Not putting weight  PRECAUTIONS: None  WEIGHT BEARING RESTRICTIONS: No  FALLS:  Has patient fallen in last 6 months? No  LIVING ENVIRONMENT: Lives with: lives with their son Lives in: House/apartment Stairs: No Has following equipment at home: None  OCCUPATION: Retired;   PLOF: Independent  PATIENT GOALS:  Improve knee alignment, get back to dancing  NEXT MD VISIT: 02/26/23 Medicare Wellness visit  OBJECTIVE:   DIAGNOSTIC FINDINGS: No knee x-rays on file  PATIENT SURVEYS:  FOTO 65; predicted 73  COGNITION: Overall cognitive status: Within functional limits for tasks assessed     SENSATION: WFL  EDEMA:  None  MUSCLE LENGTH: Hamstrings: Right 90 deg; Left 90 deg Thomas test: Did not assess   POSTURE: weight shift left and R knee valgus greater vs L  PALPATION: No overt tenderness to palpation  LOWER EXTREMITY ROM:  Active ROM Right eval Left eval  Hip flexion    Hip extension    Hip abduction    Hip adduction    Hip internal rotation    Hip external rotation    Knee flexion 120 125  Knee extension 0 0  Ankle dorsiflexion    Ankle plantarflexion    Ankle inversion    Ankle eversion     (Blank rows = not tested) In supine: Q angle on R 50 deg, Q angle on L 20 deg  LOWER EXTREMITY MMT:  MMT Right eval Left eval  Hip flexion 4 5  Hip extension 3 3+  Hip abduction 3 4-  Hip adduction    Hip internal rotation 3+ 4+  Hip external rotation 3+ 4  Knee flexion 5 5  Knee extension 5 5  Ankle dorsiflexion    Ankle plantarflexion    Ankle inversion    Ankle eversion     (Blank rows = not tested)  LOWER EXTREMITY SPECIAL TESTS:  Knee special tests: Patellafemoral grind test: positive  and lax but not painful laterally on R  FUNCTIONAL TESTS:  Did not assess  GAIT: Distance walked: 100' Assistive device utilized:  None Level of assistance:  HHA -- uses son for support Comments: Pt with visual impairment   TODAY'S TREATMENT:                                                                                                                              DATE: 02/22/23  See HEP below   PATIENT EDUCATION:  Education details: Exam findings, POC, initial HEP Person educated: Patient and Child(ren) Education method: Explanation, Demonstration, and  Handouts Education comprehension: verbalized understanding, tactile cues required, and needs further education  HOME EXERCISE PROGRAM: Access Code: [email or web address] URL: [email or web address] Date: 02/22/2023 Prepared by: Vernon Prey April Kirstie Peri  Exercises - Straight Leg Raise with External Rotation  - 1 x daily - 7 x  weekly - 3 sets - 10 reps - Clamshell with Resistance  - 1 x daily - 7 x weekly - 3 sets - 10 reps - Supine Bridge with Resistance Band  - 1 x daily - 7 x weekly - 3 sets - 10 reps - Sit to Stand with Resistance Around Legs  - 1 x daily - 7 x weekly - 3 sets - 10 reps  ASSESSMENT:  CLINICAL IMPRESSION: Patient is a 77 y.o. F who was seen today for physical therapy evaluation and treatment for R knee pain. PMH significant for visual impairment and history of cancer. Assessment significant for R>L knee valgus with Q angle of almost 50 deg, gross hip weakness, leading to pain and affecting transfers and community mobility. Pt would benefit from PT to address these issues for improved knee alignment.   OBJECTIVE IMPAIRMENTS: Abnormal gait, decreased activity tolerance, decreased balance, decreased endurance, decreased mobility, difficulty walking, decreased strength, improper body mechanics, postural dysfunction, and pain.   ACTIVITY LIMITATIONS: squatting, stairs, transfers, and locomotion level  PARTICIPATION LIMITATIONS: community activity and yard work  PERSONAL FACTORS: Age, Fitness, Past/current experiences, and Time since onset of injury/illness/exacerbation are also affecting patient's functional outcome.   REHAB POTENTIAL: Good  CLINICAL DECISION MAKING: Stable/uncomplicated  EVALUATION COMPLEXITY: Low   GOALS: Goals reviewed with patient? Yes  SHORT TERM GOALS: Target date: 03/22/2023  Pt will be ind with initial HEP Baseline: Goal status: INITIAL  2.  Pt will be able to perform sit to stand without increase in knee valgus Baseline:  Goal  status: INITIAL   LONG TERM GOALS: Target date: 04/19/2023   Pt will be ind with management and progression of HEP Baseline:  Goal status: INITIAL  2.  Pt will have improved Q angle to at least 30 deg on R to demo better alignment Baseline: 50 deg in supine Goal status: INITIAL  3.  Pt will report no pain with sit<>stand Baseline:  Goal status: INITIAL  4.  Pt will be able to squat at least 10# to demo improving functional LE strength Baseline:  Goal status: INITIAL  5.  Pt will have improved FOTO score to >/=73 Baseline:  Goal status: INITIAL   PLAN:  PT FREQUENCY: 1-2x/week (limited due to son's schedule -- he is transportation due to her visual impairment)  PT DURATION: 8 weeks  PLANNED INTERVENTIONS: Therapeutic exercises, Therapeutic activity, Neuromuscular re-education, Balance training, Gait training, Patient/Family education, Self Care, Joint mobilization, Stair training, Aquatic Therapy, Dry Needling, Electrical stimulation, Cryotherapy, Moist heat, Taping, Vasopneumatic device, Ionotophoresis 4mg /ml Dexamethasone, Manual therapy, and Re-evaluation  PLAN FOR NEXT SESSION: Assess response to HEP. Continue to strengthen hips -- focus on decreasing knee valgus.    Aireanna Luellen April Ma L Loida Calamia, PT 02/22/2023, 12:10 PM

## 2023-02-26 ENCOUNTER — Encounter (HOSPITAL_BASED_OUTPATIENT_CLINIC_OR_DEPARTMENT_OTHER): Payer: Self-pay

## 2023-02-26 ENCOUNTER — Telehealth (HOSPITAL_BASED_OUTPATIENT_CLINIC_OR_DEPARTMENT_OTHER): Payer: Self-pay

## 2023-02-26 ENCOUNTER — Ambulatory Visit (INDEPENDENT_AMBULATORY_CARE_PROVIDER_SITE_OTHER): Payer: Medicare Other

## 2023-02-26 VITALS — BP 111/72 | HR 76 | Ht 66.0 in | Wt 169.0 lb

## 2023-02-26 DIAGNOSIS — Z0000 Encounter for general adult medical examination without abnormal findings: Secondary | ICD-10-CM

## 2023-02-26 NOTE — Patient Instructions (Addendum)
Stephanie [name] , Thank you for taking time to come for your Medicare Wellness Visit. I appreciate your ongoing commitment to your health goals. Please review the following plan we discussed and let me know if I can assist you in the future.   These are the goals we discussed:  Goals       Patient Stated (pt-stated)      Patient stated she would like to decrease the number of doctor visits she has.         This is a list of the screening recommended for you and due dates:  Health Maintenance  Topic Date Due   Hepatitis C Screening: USPSTF Recommendation to screen - Ages 70-79 yo.  Never done   DTaP/Tdap/Td vaccine (1 - Tdap) Never done   Zoster (Shingles) Vaccine (1 of 2) Never done   Mammogram  Never done   DEXA scan (bone density measurement)  Never done   COVID-19 Vaccine (5 - 2023-24 season) 06/15/2022   Flu Shot  05/16/2023   Medicare Annual Wellness Visit  02/26/2024   Cologuard (Stool DNA test)  07/15/2025   Pneumonia Vaccine  Completed   HPV Vaccine  Aged Out    Advanced directives: Advance directive discussed with you today. I have provided a copy for you to complete at home and have notarized. Once this is complete please bring a copy in to our office so we can scan it into your chart.   Conditions/risks identified: Aim for 30 minutes of exercise or brisk walking, 6-8 glasses of water, and 5 servings of fruits and vegetables each day.   Next appointment: Follow up in one year for your annual wellness visit Mar 03, 2024 at 11:00 via telephone   Preventive Care 71 Years and Older, Female Preventive care refers to lifestyle choices and visits with your health care provider that can promote health and wellness. What does preventive care include? A yearly physical exam. This is also called an annual well check. Dental exams once or twice a year. Routine eye exams. Ask your health care provider how often you should have your eyes checked. Personal lifestyle choices,  including: Daily care of your teeth and gums. Regular physical activity. Eating a healthy diet. Avoiding tobacco and drug use. Limiting alcohol use. Practicing safe sex. Taking low-dose aspirin every day. Taking vitamin and mineral supplements as recommended by your health care provider. What happens during an annual well check? The services and screenings done by your health care provider during your annual well check will depend on your age, overall health, lifestyle risk factors, and family history of disease. Counseling  Your health care provider may ask you questions about your: Alcohol use. Tobacco use. Drug use. Emotional well-being. Home and relationship well-being. Sexual activity. Eating habits. History of falls. Memory and ability to understand (cognition). Work and work Astronomer. Reproductive health. Screening  You may have the following tests or measurements: Height, weight, and BMI. Blood pressure. Lipid and cholesterol levels. These may be checked every 5 years, or more frequently if you are over 77 years old. Skin check. Lung cancer screening. You may have this screening every year starting at age 33 if you have a 30-pack-year history of smoking and currently smoke or have quit within the past 15 years. Fecal occult blood test (FOBT) of the stool. You may have this test every year starting at age 50. Flexible sigmoidoscopy or colonoscopy. You may have a sigmoidoscopy every 5 years or a colonoscopy every 10 years  starting at age 66. Hepatitis C blood test. Hepatitis B blood test. Sexually transmitted disease (STD) testing. Diabetes screening. This is done by checking your blood sugar (glucose) after you have not eaten for a while (fasting). You may have this done every 1-3 years. Bone density scan. This is done to screen for osteoporosis. You may have this done starting at age 55. Mammogram. This may be done every 1-2 years. Talk to your health care provider  about how often you should have regular mammograms. Talk with your health care provider about your test results, treatment options, and if necessary, the need for more tests. Vaccines  Your health care provider may recommend certain vaccines, such as: Influenza vaccine. This is recommended every year. Tetanus, diphtheria, and acellular pertussis (Tdap, Td) vaccine. You may need a Td booster every 10 years. Zoster vaccine. You may need this after age 80. Pneumococcal 13-valent conjugate (PCV13) vaccine. One dose is recommended after age 69. Pneumococcal polysaccharide (PPSV23) vaccine. One dose is recommended after age 55. Talk to your health care provider about which screenings and vaccines you need and how often you need them. This information is not intended to replace advice given to you by your health care provider. Make sure you discuss any questions you have with your health care provider. Document Released: 10/28/2015 Document Revised: 06/20/2016 Document Reviewed: 08/02/2015 Elsevier Interactive Patient Education  2017 [place] Prevention in the Home Falls can cause injuries. They can happen to people of all ages. There are many things you can do to make your home safe and to help prevent falls. What can I do on the outside of my home? Regularly fix the edges of walkways and driveways and fix any cracks. Remove anything that might make you trip as you walk through a door, such as a raised step or threshold. Trim any bushes or trees on the path to your home. Use bright outdoor lighting. Clear any walking paths of anything that might make someone trip, such as rocks or tools. Regularly check to see if handrails are loose or broken. Make sure that both sides of any steps have handrails. Any raised decks and porches should have guardrails on the edges. Have any leaves, snow, or ice cleared regularly. Use sand or salt on walking paths during winter. Clean up any spills in  your garage right away. This includes oil or grease spills. What can I do in the bathroom? Use night lights. Install grab bars by the toilet and in the tub and shower. Do not use towel bars as grab bars. Use non-skid mats or decals in the tub or shower. If you need to sit down in the shower, use a plastic, non-slip stool. Keep the floor dry. Clean up any water that spills on the floor as soon as it happens. Remove soap buildup in the tub or shower regularly. Attach bath mats securely with double-sided non-slip rug tape. Do not have throw rugs and other things on the floor that can make you trip. What can I do in the bedroom? Use night lights. Make sure that you have a light by your bed that is easy to reach. Do not use any sheets or blankets that are too big for your bed. They should not hang down onto the floor. Have a firm chair that has side arms. You can use this for support while you get dressed. Do not have throw rugs and other things on the floor that can make you trip. What  can I do in the kitchen? Clean up any spills right away. Avoid walking on wet floors. Keep items that you use a lot in easy-to-reach places. If you need to reach something above you, use a strong step stool that has a grab bar. Keep electrical cords out of the way. Do not use floor polish or wax that makes floors slippery. If you must use wax, use non-skid floor wax. Do not have throw rugs and other things on the floor that can make you trip. What can I do with my stairs? Do not leave any items on the stairs. Make sure that there are handrails on both sides of the stairs and use them. Fix handrails that are broken or loose. Make sure that handrails are as long as the stairways. Check any carpeting to make sure that it is firmly attached to the stairs. Fix any carpet that is loose or worn. Avoid having throw rugs at the top or bottom of the stairs. If you do have throw rugs, attach them to the floor with carpet  tape. Make sure that you have a light switch at the top of the stairs and the bottom of the stairs. If you do not have them, ask someone to add them for you. What else can I do to help prevent falls? Wear shoes that: Do not have high heels. Have rubber bottoms. Are comfortable and fit you well. Are closed at the toe. Do not wear sandals. If you use a stepladder: Make sure that it is fully opened. Do not climb a closed stepladder. Make sure that both sides of the stepladder are locked into place. Ask someone to hold it for you, if possible. Clearly mark and make sure that you can see: Any grab bars or handrails. First and last steps. Where the edge of each step is. Use tools that help you move around (mobility aids) if they are needed. These include: Canes. Walkers. Scooters. Crutches. Turn on the lights when you go into a dark area. Replace any light bulbs as soon as they burn out. Set up your furniture so you have a clear path. Avoid moving your furniture around. If any of your floors are uneven, fix them. If there are any pets around you, be aware of where they are. Review your medicines with your doctor. Some medicines can make you feel dizzy. This can increase your chance of falling. Ask your doctor what other things that you can do to help prevent falls. This information is not intended to replace advice given to you by your health care provider. Make sure you discuss any questions you have with your health care provider. Document Released: 07/28/2009 Document Revised: 03/08/2016 Document Reviewed: 11/05/2014 Elsevier Interactive Patient Education  2017 [employer].

## 2023-02-26 NOTE — Telephone Encounter (Signed)
 Please mail patient advance directives paperwork. Thank you!!  - W

## 2023-02-26 NOTE — Progress Notes (Addendum)
 I connected with  Stephanie [name] on 02/26/23 by a audio enabled telemedicine application and verified that I am speaking with the correct person using two identifiers.  Patient Location: Home  Provider Location: Home Office  I discussed the limitations of evaluation and management by telemedicine. The patient expressed understanding and agreed to proceed.  Subjective:   Stephanie [name] is a 76 y.o. female who presents for an Initial Medicare Annual Wellness Visit.  Review of Systems     Cardiac Risk Factors include: advanced age (>46men, >61 women);dyslipidemia;hypertension;sedentary lifestyle;Other (see comment) (hx of cancer), Risk factor comments: hx of cancer, now in remission     Objective:    Today's Vitals   02/26/23 1107  BP: 111/72  Pulse: 76  Weight: 169 lb (76.7 kg)  Height: 5\' 6"  (1.676 m)  PainSc: 0-No pain   Body mass index is 27.28 kg/m.     02/26/2023   11:16 AM 02/22/2023   11:06 AM 06/08/2021   12:25 PM 11/09/2020    2:13 PM 09/20/2020    1:13 PM 01/25/2020    3:57 PM 10/28/2019    9:01 AM  Advanced Directives  Does Patient Have a Medical Advance Directive? No No No No No No No  Would patient like information on creating a medical advance directive? Yes (MAU/Ambulatory/Procedural Areas - Information given) Yes (MAU/Ambulatory/Procedural Areas - Information given)  No - Patient declined No - Patient declined No - Patient declined     Current Medications (verified) Outpatient Encounter Medications as of 02/26/2023  Medication Sig   amLODipine (NORVASC) 5 MG tablet Take 1 tablet (5 mg total) by mouth 2 (two) times daily.   atorvastatin (LIPITOR) 20 MG tablet Take 1 tablet (20 mg total) by mouth daily.   benazepril (LOTENSIN) 20 MG tablet Take 1 tablet (20 mg total) by mouth daily.   brimonidine (ALPHAGAN) 0.2 % ophthalmic solution    busPIRone (BUSPAR) 7.5 MG tablet Take 1 tablet (7.5 mg total) by mouth 2 (two) times daily.   dorzolamide-timolol  (COSOPT) 22.3-6.8 MG/ML ophthalmic solution Place 1 drop into the left eye 2 (two) times daily.   metoprolol tartrate (LOPRESSOR) 25 MG tablet Take 1 tablet (25 mg total) by mouth 2 (two) times daily.   ROCKLATAN 0.02-0.005 % SOLN Apply to eye.   No facility-administered encounter medications on file as of 02/26/2023.    Allergies (verified) Iron, Iron dextran, Penicillins, Amoxicillin, Ampicillin, Codeine, Diamox [acetazolamide], Penicillin g, and Other   History: Past Medical History:  Diagnosis Date   Anemia    Anxiety    Blindness, legal    Cataract    Complication of anesthesia    Difficulty sleeping    Endometrial cancer (HCC)    Family history of liver cancer    Family history of lung cancer    Fibroids    Glaucoma    History of transfusion    Hypertension    PNA (pneumonia)    PONV (postoperative nausea and vomiting)    Vaginal bleeding    Past Surgical History:  Procedure Laterality Date   ABDOMINAL HYSTERECTOMY N/A 07/21/2013   Procedure: TOTAL HYSTERECTOMY ABDOMINAL ;  Surgeon: Jeannette Corpus, MD;  Location: [facility];  Service: Gynecology;  Laterality: N/A;   DILATION AND CURETTAGE OF UTERUS     LAPAROTOMY N/A 07/21/2013   Procedure: EXPLORATORY LAPAROTOMY/ PELVIC LYMPHADENECTOMY   ;  Surgeon: Jeannette Corpus, MD;  Location: [facility];  Service: Gynecology;  Laterality: N/A;   SALPINGOOPHORECTOMY  Bilateral 07/21/2013   Procedure: BILATERAL SALPINGO OOPHORECTOMY;  Surgeon: Jeannette Corpus, MD;  Location: [facility];  Service: Gynecology;  Laterality: Bilateral;   Family History  Problem Relation Age of Onset   Liver cancer Mother 96   Hypertension Father    Vision loss Maternal Grandmother    Lung cancer Paternal Uncle    Social History   Socioeconomic History   Marital status: Divorced    Spouse name: Not on file   Number of children: 1   Years of education: Not on file   Highest education level: Not on file  Occupational History   Not on  file  Tobacco Use   Smoking status: Never   Smokeless tobacco: Never  Substance and Sexual Activity   Alcohol use: No   Drug use: No   Sexual activity: Not Currently    Birth control/protection: None  Other Topics Concern   Not on file  Social History Narrative   02/26/23-patient has completed her cancer treatments and is no in remission. She only has to go to the cancer center once yearly   Social Determinants of Health   Financial Resource Strain: Low Risk  (02/26/2023)   Overall Financial Resource Strain (CARDIA)    Difficulty of Paying Living Expenses: Not hard at all  Food Insecurity: No Food Insecurity (02/26/2023)   Hunger Vital Sign    Worried About Running Out of Food in the Last Year: Never true    Ran Out of Food in the Last Year: Never true  Transportation Needs: No Transportation Needs (02/26/2023)   PRAPARE - Administrator, Civil Service (Medical): No    Lack of Transportation (Non-Medical): No  Physical Activity: Insufficiently Active (02/26/2023)   Exercise Vital Sign    Days of Exercise per Week: 2 days    Minutes of Exercise per Session: 20 min  Stress: No Stress Concern Present (02/26/2023)   [employer] - Occupational Stress Questionnaire    Feeling of Stress : Not at all  Social Connections: Socially Integrated (02/26/2023)   Social Connection and Isolation Panel [NHANES]    Frequency of Communication with Friends and Family: More than three times a week    Frequency of Social Gatherings with Friends and Family: More than three times a week    Attends Religious Services: More than 4 times per year    Active Member of [employer] or Organizations: Yes    Attends Engineer, structural: More than 4 times per year    Marital Status: Married    Tobacco Counseling Counseling given: Yes   Clinical Intake:  Pre-visit preparation completed: Yes  Pain : No/denies pain Pain Score: 0-No pain     BMI - recorded:  27.28 Nutritional Status: BMI 25 -29 Overweight Nutritional Risks: None Diabetes: No  How often do you need to have someone help you when you read instructions, pamphlets, or other written materials from your doctor or pharmacy?: 1 - Never  Diabetic?NO  Interpreter Needed?: No  Information entered by ::  Jayde Daffin, CMA   Activities of Daily Living    02/26/2023   11:18 AM 02/25/2023    3:29 PM  In your present state of health, do you have any difficulty performing the following activities:  Hearing? 0 0  Vision? 1 1  Comment legally blind   Difficulty concentrating or making decisions? 0 0  Walking or climbing stairs? 0 0  Dressing or bathing? 0 0  Doing errands, shopping?  1 1  Comment her son takes her to her doctors appts and she does the majority of her shopping online.   Preparing Food and eating ? N N  Using the Toilet? N N  In the past six months, have you accidently leaked urine? N N  Do you have problems with loss of bowel control? N N  Managing your Medications? N N  Managing your Finances? N N  Housekeeping or managing your Housekeeping? N N    Patient Care Team: Alyson Reedy, FNP as PCP - General (Family Medicine)  Indicate any recent Medical Services you may have received from other than [facility] providers in the past year (date may be approximate).     Assessment:   This is a routine wellness examination for [place].  Hearing/Vision screen Hearing Screening - Comments:: Patient denies any hearing difficulties  Vision Screening - Comments:: Patient states she has glaucoma and is now legally blind. She doesn't wear glasses though.   Dietary issues and exercise activities discussed: Current Exercise Habits: Home exercise routine, Time (Minutes): 20, Frequency (Times/Week): 2, Weekly Exercise (Minutes/Week): 40, Intensity: Mild, Exercise limited by: orthopedic condition(s);Other - see comments (chronic knee pain and legally blind.)   Goals Addressed                This Visit's Progress     Patient Stated (pt-stated)        Patient stated she would like to decrease the number of doctor visits she has.        Depression Screen    02/26/2023   11:13 AM 02/06/2023    1:09 PM 07/09/2018    9:31 AM 01/02/2017   10:53 AM 03/29/2016    4:37 PM 05/15/2013    4:22 PM  PHQ 2/9 Scores  PHQ - 2 Score 0 0  0 0 0  PHQ- 9 Score     3   Exception Documentation   Medical reason       Fall Risk    02/26/2023   11:17 AM 02/25/2023    3:29 PM 02/06/2023    1:08 PM 03/11/2019   11:37 AM 02/17/2019   10:33 AM  Fall Risk   Falls in the past year? 0 0 0 0 0  Number falls in past yr: 0  0    Injury with Fall? 0  0    Risk for fall due to : No Fall Risks      Follow up Falls prevention discussed        FALL RISK PREVENTION PERTAINING TO THE HOME:  Any stairs in or around the home? No  If so, are there any without handrails? No  Home free of loose throw rugs in walkways, pet beds, electrical cords, etc? Yes  Adequate lighting in your home to reduce risk of falls? Yes   ASSISTIVE DEVICES UTILIZED TO PREVENT FALLS:  Life alert? No  Use of a cane, walker or w/c? No  Grab bars in the bathroom? No  Shower chair or bench in shower? No  Elevated toilet seat or a handicapped toilet? No   TIMED UP AND GO:  Was the test performed?  N/A telephone visit .   Cognitive Function:        02/26/2023   11:20 AM  6CIT Screen  What Year? 0 points  What month? 0 points  What time? 0 points  Count back from 20 0 points  Months in reverse 0 points  Repeat phrase 0 points  Total Score 0 points    Immunizations Immunization History  Administered Date(s) Administered   Influenza, High Dose Seasonal PF 07/27/2016   Influenza, Seasonal, Injecte, Preservative Fre 08/03/2013   Influenza,inj,Quad PF,6+ Mos 07/07/2014, 07/28/2015, 07/09/2018   Influenza-Unspecified 07/10/2017, 07/19/2018   Moderna Sars-Covid-2 Vaccination 07/27/2021   PFIZER(Purple  Top)SARS-COV-2 Vaccination 11/27/2019, 12/19/2019, 06/28/2020   Pneumococcal Conjugate-13 02/01/2015   Pneumococcal Polysaccharide-23 02/02/2014    TDAP status: Due, Education has been provided regarding the importance of this vaccine. Advised may receive this vaccine at local pharmacy or Health Dept. Aware to provide a copy of the vaccination record if obtained from local pharmacy or Health Dept. Verbalized acceptance and understanding.  Flu Vaccine status: Up to date  Pneumococcal vaccine status: Declined,  Education has been provided regarding the importance of this vaccine but patient still declined. Advised may receive this vaccine at local pharmacy or Health Dept. Aware to provide a copy of the vaccination record if obtained from local pharmacy or Health Dept. Verbalized acceptance and understanding.   Covid-19 vaccine status: Completed vaccines  Qualifies for Shingles Vaccine? Yes   Zostavax completed No   Shingrix Completed?: No.    Education has been provided regarding the importance of this vaccine. Patient has been advised to call insurance company to determine out of pocket expense if they have not yet received this vaccine. Advised may also receive vaccine at local pharmacy or Health Dept. Verbalized acceptance and understanding.  Screening Tests Health Maintenance  Topic Date Due   Hepatitis C Screening  Never done   DTaP/Tdap/Td (1 - Tdap) Never done   Zoster Vaccines- Shingrix (1 of 2) Never done   MAMMOGRAM  Never done   DEXA SCAN  Never done   COVID-19 Vaccine (5 - 2023-24 season) 06/15/2022   INFLUENZA VACCINE  05/16/2023   Medicare Annual Wellness (AWV)  02/26/2024   Fecal DNA (Cologuard)  07/15/2025   Pneumonia Vaccine 50+ Years old  Completed   HPV VACCINES  Aged Out    Health Maintenance  Health Maintenance Due  Topic Date Due   Hepatitis C Screening  Never done   DTaP/Tdap/Td (1 - Tdap) Never done   Zoster Vaccines- Shingrix (1 of 2) Never done    MAMMOGRAM  Never done   DEXA SCAN  Never done   COVID-19 Vaccine (5 - 2023-24 season) 06/15/2022    Colorectal cancer screening: Type of screening: Cologuard. Completed 07/15/2022. Repeat every 3 years  PATIENT DECLINED MAMMOGRAM, DEXA SCAN, TETANUS VACCINE, AND SHINGRIX VACCINE    Lung Cancer Screening: (Low Dose CT Chest recommended if Age 26-80 years, 30 pack-year currently smoking OR have quit w/in 15years.) does not qualify.   Lung Cancer Screening Referral: N/A  Additional Screening:  Hepatitis C Screening: DOES NOT/ AGED OUT  Vision Screening: Recommended annual ophthalmology exams for early detection of glaucoma and other disorders of the eye. Is the patient up to date with their annual eye exam?  Yes  Who is the provider or what is the name of the office in which the patient attends annual eye exams? [facility] If pt is not established with a provider, would they like to be referred to a provider to establish care?  ALREADY ESTABLISHED .   Dental Screening: Recommended annual dental exams for proper oral hygiene  Community Resource Referral / Chronic Care Management: CRR required this visit?  No   CCM required this visit?  No      Plan:     I have  personally reviewed and noted the following in the patient's chart:   Medical and social history Use of alcohol, tobacco or illicit drugs  Current medications and supplements including opioid prescriptions. Patient is not currently taking opioid prescriptions. Functional ability and status Nutritional status Physical activity Advanced directives List of other physicians Hospitalizations, surgeries, and ER visits in previous 12 months Vitals Screenings to include cognitive, depression, and falls Referrals and appointments  In addition, I have reviewed and discussed with patient certain preventive protocols, quality metrics, and best practice recommendations. A written personalized care plan for preventive services  as well as general preventive health recommendations were provided to patient.   Patient would like to access her AVS via my-chart     Marjon Doxtater, CMA            02/26/2023  [facility] AWV Team  Nurse Notes: Patient would like copies of Advance Directives. She declined a referral for a mammogram, Dexa scan, tetanus vaccine, and shingrix vaccine, and Hep C screening.

## 2023-03-06 ENCOUNTER — Encounter (HOSPITAL_BASED_OUTPATIENT_CLINIC_OR_DEPARTMENT_OTHER): Payer: Self-pay

## 2023-03-06 NOTE — Telephone Encounter (Signed)
Advanced Directives has been sent

## 2023-03-13 ENCOUNTER — Ambulatory Visit: Payer: Medicare Other

## 2023-03-18 ENCOUNTER — Ambulatory Visit: Payer: Medicare Other | Admitting: Physical Therapy

## 2023-03-25 ENCOUNTER — Ambulatory Visit: Payer: Medicare Other | Admitting: Physical Therapy

## 2023-03-25 ENCOUNTER — Telehealth: Payer: Self-pay

## 2023-03-25 NOTE — Telephone Encounter (Signed)
-----   Message from Artis Delay, MD sent at 03/25/2023  7:20 AM EDT ----- Pls help her schedule CT for Aug 12

## 2023-03-25 NOTE — Telephone Encounter (Signed)
Called and scheduled CT. Called and given appt for lab at [facility] at 10 am, then go to [facility] radiology for 11 am CT, NPO 4 hours prior to CT. Given appt for 1000 am to see Dr. Bertis Ruddy on 8/15. She verbalized understanding and appreciated the call.

## 2023-04-03 ENCOUNTER — Ambulatory Visit: Payer: Medicare Other | Attending: Family Medicine

## 2023-04-03 DIAGNOSIS — M6281 Muscle weakness (generalized): Secondary | ICD-10-CM | POA: Insufficient documentation

## 2023-04-03 DIAGNOSIS — R252 Cramp and spasm: Secondary | ICD-10-CM | POA: Insufficient documentation

## 2023-04-03 DIAGNOSIS — R2689 Other abnormalities of gait and mobility: Secondary | ICD-10-CM | POA: Insufficient documentation

## 2023-04-03 DIAGNOSIS — R262 Difficulty in walking, not elsewhere classified: Secondary | ICD-10-CM | POA: Diagnosis not present

## 2023-04-03 DIAGNOSIS — M25561 Pain in right knee: Secondary | ICD-10-CM | POA: Diagnosis not present

## 2023-04-03 DIAGNOSIS — G8929 Other chronic pain: Secondary | ICD-10-CM | POA: Diagnosis not present

## 2023-04-03 NOTE — Therapy (Signed)
OUTPATIENT PHYSICAL THERAPY LOWER EXTREMITY TREATMENT NOTE   Patient Name: STEPHENIE LARABEE MRN: [id number] DOB:[birth year], 77 y.o., female Today's Date: 04/03/2023  END OF SESSION:  PT End of Session - 04/03/23 1155     Visit Number 2    Authorization Type [employer] Medicare    PT Start Time 1150    PT Stop Time 1230    PT Time Calculation (min) 40 min    Activity Tolerance Patient tolerated treatment well    Behavior During Therapy WFL for tasks assessed/performed             Past Medical History:  Diagnosis Date   Anemia    Anxiety    Blindness, legal    Cataract    Complication of anesthesia    Difficulty sleeping    Endometrial cancer (HCC)    Family history of liver cancer    Family history of lung cancer    Fibroids    Glaucoma    History of transfusion    Hypertension    PNA (pneumonia)    PONV (postoperative nausea and vomiting)    Vaginal bleeding    Past Surgical History:  Procedure Laterality Date   ABDOMINAL HYSTERECTOMY N/A 07/21/2013   Procedure: TOTAL HYSTERECTOMY ABDOMINAL ;  Surgeon: Jeannette Corpus, MD;  Location: [facility];  Service: Gynecology;  Laterality: N/A;   DILATION AND CURETTAGE OF UTERUS     LAPAROTOMY N/A 07/21/2013   Procedure: EXPLORATORY LAPAROTOMY/ PELVIC LYMPHADENECTOMY   ;  Surgeon: Jeannette Corpus, MD;  Location: [facility];  Service: Gynecology;  Laterality: N/A;   SALPINGOOPHORECTOMY Bilateral 07/21/2013   Procedure: BILATERAL SALPINGO OOPHORECTOMY;  Surgeon: Jeannette Corpus, MD;  Location: [facility];  Service: Gynecology;  Laterality: Bilateral;   Patient Active Problem List   Diagnosis Date Noted   Anxiety 02/06/2023   Mixed hyperlipidemia 02/06/2023   Chronic knee pain 02/06/2023   Hypokalemia 11/10/2020   Thrombocytopenia (HCC) 08/09/2020   Poor venous access 06/28/2020   Preventive measure 06/28/2020   Leukopenia 02/23/2020   Essential hypertension 01/12/2020   Genetic testing 11/24/2019   Goals of  care, counseling/discussion 10/29/2019   Other constipation 10/29/2019   Family history of lung cancer    Family history of liver cancer    Uncontrolled hypertension 02/01/2015   Tachycardia 02/01/2015   Endometrial cancer (HCC) 06/17/2013   Fibroid uterus 06/01/2013   Thickened endometrium 06/01/2013   Postmenopausal vaginal bleeding 06/01/2013   Follow up 05/22/2013   Severe uncontrolled hypertension 05/15/2013   Generalized anxiety disorder 05/15/2013   SOB (shortness of breath) 04/11/2013   Iron deficiency 04/11/2013   Glaucoma 04/11/2013   Iron deficiency anemia due to chronic blood loss 04/04/2012   Senile nuclear sclerosis 02/18/2012   Primary open-angle glaucoma 02/18/2012   Severe stage glaucoma 02/18/2012    PCP: Alyson Reedy, FNP  REFERRING PROVIDER: Alyson Reedy, FNP  REFERRING DIAG:  [phone] (ICD-10-CM) - Chronic knee pain, unspecified laterality    THERAPY DIAG:  Chronic pain of right knee  Other abnormalities of gait and mobility  Muscle weakness (generalized)  Difficulty in walking, not elsewhere classified  Cramp and spasm  Rationale for Evaluation and Treatment: Rehabilitation  ONSET DATE: ~1 year  SUBJECTIVE:   SUBJECTIVE STATEMENT: Pt reports she is doing good.  Pain is 0/10.  "I've been doing my exercises!"  PERTINENT HISTORY: R knee injury at 18, visual impairment  PAIN:  04/03/23: Are you having pain? Yes: NPRS scale: 0 currently/10 Pain location: R  lateral portion of knee Pain description: Aching, popping, catching Aggravating factors: sit to stand, stairs Relieving factors: Not putting weight  PRECAUTIONS: None  WEIGHT BEARING RESTRICTIONS: No  FALLS:  Has patient fallen in last 6 months? No  LIVING ENVIRONMENT: Lives with: lives with their son Lives in: House/apartment Stairs: No Has following equipment at home: None  OCCUPATION: Retired;   PLOF: Independent  PATIENT GOALS: Improve knee alignment,  get back to dancing  NEXT MD VISIT: 02/26/23 Medicare Wellness visit  OBJECTIVE:   DIAGNOSTIC FINDINGS: No knee x-rays on file  PATIENT SURVEYS:  FOTO 65; predicted 73  COGNITION: Overall cognitive status: Within functional limits for tasks assessed     SENSATION: WFL  EDEMA:  None  MUSCLE LENGTH: Hamstrings: Right 90 deg; Left 90 deg Thomas test: Did not assess   POSTURE: weight shift left and R knee valgus greater vs L  PALPATION: No overt tenderness to palpation  LOWER EXTREMITY ROM:  Active ROM Right eval Left eval  Hip flexion    Hip extension    Hip abduction    Hip adduction    Hip internal rotation    Hip external rotation    Knee flexion 120 125  Knee extension 0 0  Ankle dorsiflexion    Ankle plantarflexion    Ankle inversion    Ankle eversion     (Blank rows = not tested) In supine: Q angle on R 50 deg, Q angle on L 20 deg  LOWER EXTREMITY MMT:  MMT Right eval Left eval  Hip flexion 4 5  Hip extension 3 3+  Hip abduction 3 4-  Hip adduction    Hip internal rotation 3+ 4+  Hip external rotation 3+ 4  Knee flexion 5 5  Knee extension 5 5  Ankle dorsiflexion    Ankle plantarflexion    Ankle inversion    Ankle eversion     (Blank rows = not tested)  LOWER EXTREMITY SPECIAL TESTS:  Knee special tests: Patellafemoral grind test: positive  and lax but not painful laterally on R  FUNCTIONAL TESTS:  Did not assess  GAIT: Distance walked: 100' Assistive device utilized:  None Level of assistance:  HHA -- uses son for support Comments: Pt with visual impairment   TODAY'S TREATMENT:                                                                                                                              DATE: 04/03/23 Nustep x 5 min level 4 Seated toe and heel raises x 20 Seated LAQ with 2 lbs x 20 both Seated march with 2 lbs x 20 both Seated hip ER with 2 lbs x 20 both Supine quad set x 20 both Supine TKE with half roll x 20  with 2 lbs both Supine TKE with white foam roller x 20 with 2 lbs both Supine SAQ with larger blue bolster x 20 with 2 lbs both Supine SLR  with 2 lbs 2 x 20 each (verbal cues for avoiding hip ER) Hook lying clamshell with yellow loop x 20 Side lying clamshell with yellow loop x 20 each side Seated clamshell with yellow loop x 20  Standing hip ER with yellow loop 2 x 10 Ice to right knee post exercise x 10 min  DATE: 02/22/23  See HEP below   PATIENT EDUCATION:  Education details: Exam findings, POC, initial HEP Person educated: Patient and Child(ren) Education method: Explanation, Demonstration, and Handouts Education comprehension: verbalized understanding, tactile cues required, and needs further education  HOME EXERCISE PROGRAM: Access Code: [email or web address] URL: [email or web address] Date: 02/22/2023 Prepared by: Vernon Prey April Kirstie Peri  Exercises - Straight Leg Raise with External Rotation  - 1 x daily - 7 x weekly - 3 sets - 10 reps - Clamshell with Resistance  - 1 x daily - 7 x weekly - 3 sets - 10 reps - Supine Bridge with Resistance Band  - 1 x daily - 7 x weekly - 3 sets - 10 reps - Sit to Stand with Resistance Around Legs  - 1 x daily - 7 x weekly - 3 sets - 10 reps  ASSESSMENT:  CLINICAL IMPRESSION: Patient was able to complete multi range quad work with 2 lbs along with hip strengthening without significant fatigue.  She only had discomfort with standing hip ER with resistance loop.  We finished up with ice.  She will continue her current HEP.   Pt would benefit from PT to address quad and hip weakness for improved knee alignment.   OBJECTIVE IMPAIRMENTS: Abnormal gait, decreased activity tolerance, decreased balance, decreased endurance, decreased mobility, difficulty walking, decreased strength, improper body mechanics, postural dysfunction, and pain.   ACTIVITY LIMITATIONS: squatting, stairs, transfers, and locomotion level  PARTICIPATION LIMITATIONS:  community activity and yard work  PERSONAL FACTORS: Age, Fitness, Past/current experiences, and Time since onset of injury/illness/exacerbation are also affecting patient's functional outcome.   REHAB POTENTIAL: Good  CLINICAL DECISION MAKING: Stable/uncomplicated  EVALUATION COMPLEXITY: Low   GOALS: Goals reviewed with patient? Yes  SHORT TERM GOALS: Target date: 03/22/2023  Pt will be ind with initial HEP Baseline: Goal status: MET 04/03/23  2.  Pt will be able to perform sit to stand without increase in knee valgus Baseline:  Goal status: INITIAL   LONG TERM GOALS: Target date: 04/19/2023   Pt will be ind with management and progression of HEP Baseline:  Goal status: INITIAL  2.  Pt will have improved Q angle to at least 30 deg on R to demo better alignment Baseline: 50 deg in supine Goal status: INITIAL  3.  Pt will report no pain with sit<>stand Baseline:  Goal status: INITIAL  4.  Pt will be able to squat at least 10# to demo improving functional LE strength Baseline:  Goal status: INITIAL  5.  Pt will have improved FOTO score to >/=73 Baseline:  Goal status: INITIAL   PLAN:  PT FREQUENCY: 1-2x/week (limited due to son's schedule -- he is transportation due to her visual impairment)  PT DURATION: 8 weeks  PLANNED INTERVENTIONS: Therapeutic exercises, Therapeutic activity, Neuromuscular re-education, Balance training, Gait training, Patient/Family education, Self Care, Joint mobilization, Stair training, Aquatic Therapy, Dry Needling, Electrical stimulation, Cryotherapy, Moist heat, Taping, Vasopneumatic device, Ionotophoresis 4mg /ml Dexamethasone, Manual therapy, and Re-evaluation  PLAN FOR NEXT SESSION: Nustep, quad rehab, alignment training.   Victorino Dike B. Zyion Doxtater, PT 04/03/23 6:32 PM [facility] [address] [place], [place] [address]  Phone # [phone] Fax [phone]

## 2023-04-09 ENCOUNTER — Ambulatory Visit: Payer: Medicare Other | Admitting: Physical Therapy

## 2023-04-22 ENCOUNTER — Ambulatory Visit: Payer: Medicare Other | Attending: Family Medicine | Admitting: Physical Therapy

## 2023-04-22 DIAGNOSIS — G8929 Other chronic pain: Secondary | ICD-10-CM | POA: Insufficient documentation

## 2023-04-22 DIAGNOSIS — M6281 Muscle weakness (generalized): Secondary | ICD-10-CM | POA: Diagnosis not present

## 2023-04-22 DIAGNOSIS — R2689 Other abnormalities of gait and mobility: Secondary | ICD-10-CM | POA: Insufficient documentation

## 2023-04-22 DIAGNOSIS — R252 Cramp and spasm: Secondary | ICD-10-CM | POA: Insufficient documentation

## 2023-04-22 DIAGNOSIS — M25561 Pain in right knee: Secondary | ICD-10-CM | POA: Diagnosis not present

## 2023-04-22 DIAGNOSIS — R262 Difficulty in walking, not elsewhere classified: Secondary | ICD-10-CM | POA: Insufficient documentation

## 2023-04-22 NOTE — Therapy (Signed)
OUTPATIENT PHYSICAL THERAPY LOWER EXTREMITY TREATMENT NOTE AND RE-CERT   Patient Name: Stephanie [name] MRN: [id number] DOB:1945/11/13, 77 y.o., female Today's Date: 04/22/2023  END OF SESSION:  PT End of Session - 04/22/23 1105     Visit Number 3    Authorization Type [employer] Medicare    PT Start Time 1105    PT Stop Time 1145    PT Time Calculation (min) 40 min    Activity Tolerance Patient tolerated treatment well    Behavior During Therapy WFL for tasks assessed/performed              Past Medical History:  Diagnosis Date   Anemia    Anxiety    Blindness, legal    Cataract    Complication of anesthesia    Difficulty sleeping    Endometrial cancer (HCC)    Family history of liver cancer    Family history of lung cancer    Fibroids    Glaucoma    History of transfusion    Hypertension    PNA (pneumonia)    PONV (postoperative nausea and vomiting)    Vaginal bleeding    Past Surgical History:  Procedure Laterality Date   ABDOMINAL HYSTERECTOMY N/A 07/21/2013   Procedure: TOTAL HYSTERECTOMY ABDOMINAL ;  Surgeon: Jeannette Corpus, MD;  Location: [facility];  Service: Gynecology;  Laterality: N/A;   DILATION AND CURETTAGE OF UTERUS     LAPAROTOMY N/A 07/21/2013   Procedure: EXPLORATORY LAPAROTOMY/ PELVIC LYMPHADENECTOMY   ;  Surgeon: Jeannette Corpus, MD;  Location: [facility];  Service: Gynecology;  Laterality: N/A;   SALPINGOOPHORECTOMY Bilateral 07/21/2013   Procedure: BILATERAL SALPINGO OOPHORECTOMY;  Surgeon: Jeannette Corpus, MD;  Location: [facility];  Service: Gynecology;  Laterality: Bilateral;   Patient Active Problem List   Diagnosis Date Noted   Anxiety 02/06/2023   Mixed hyperlipidemia 02/06/2023   Chronic knee pain 02/06/2023   Hypokalemia 11/10/2020   Thrombocytopenia (HCC) 08/09/2020   Poor venous access 06/28/2020   Preventive measure 06/28/2020   Leukopenia 02/23/2020   Essential hypertension 01/12/2020   Genetic testing 11/24/2019    Goals of care, counseling/discussion 10/29/2019   Other constipation 10/29/2019   Family history of lung cancer    Family history of liver cancer    Uncontrolled hypertension 02/01/2015   Tachycardia 02/01/2015   Endometrial cancer (HCC) 06/17/2013   Fibroid uterus 06/01/2013   Thickened endometrium 06/01/2013   Postmenopausal vaginal bleeding 06/01/2013   Follow up 05/22/2013   Severe uncontrolled hypertension 05/15/2013   Generalized anxiety disorder 05/15/2013   SOB (shortness of breath) 04/11/2013   Iron deficiency 04/11/2013   Glaucoma 04/11/2013   Iron deficiency anemia due to chronic blood loss 04/04/2012   Senile nuclear sclerosis 02/18/2012   Primary open-angle glaucoma 02/18/2012   Severe stage glaucoma 02/18/2012    PCP: Alyson Reedy, FNP  REFERRING PROVIDER: Alyson Reedy, FNP  REFERRING DIAG:  [phone] (ICD-10-CM) - Chronic knee pain, unspecified laterality    THERAPY DIAG:  Chronic pain of right knee  Other abnormalities of gait and mobility  Muscle weakness (generalized)  Difficulty in walking, not elsewhere classified  Cramp and spasm  Rationale for Evaluation and Treatment: Rehabilitation  ONSET DATE: ~1 year  SUBJECTIVE:   SUBJECTIVE STATEMENT: Pt states she has noticed good improvements in her knee alignment. "It's been working." However, she does report twisting her knee in her bed skirt last night so it's a little sore. "Other than me twisting my knee, I'm  fixed."  PERTINENT HISTORY: R knee injury at 18, visual impairment  PAIN:  Are you having pain? Yes: NPRS scale: 5 currently/10 Pain location: R lateral portion of knee Pain description: Aching, popping, catching Aggravating factors: sit to stand, stairs Relieving factors: Not putting weight  PRECAUTIONS: None  WEIGHT BEARING RESTRICTIONS: No  FALLS:  Has patient fallen in last 6 months? No  LIVING ENVIRONMENT: Lives with: lives with their son Lives in:  House/apartment Stairs: No Has following equipment at home: None  OCCUPATION: Retired;   PLOF: Independent  PATIENT GOALS: Improve knee alignment, get back to dancing  NEXT MD VISIT: 02/26/23 Medicare Wellness visit  OBJECTIVE:   DIAGNOSTIC FINDINGS: No knee x-rays on file  PATIENT SURVEYS:  FOTO 65; predicted 73  MUSCLE LENGTH: Hamstrings: Right 90 deg; Left 90 deg Thomas test: Did not assess   POSTURE: weight shift left and R knee valgus greater vs L  PALPATION: No overt tenderness to palpation  LOWER EXTREMITY ROM:  Active ROM Right eval Left eval  Hip flexion    Hip extension    Hip abduction    Hip adduction    Hip internal rotation    Hip external rotation    Knee flexion 120 125  Knee extension 0 0  Ankle dorsiflexion    Ankle plantarflexion    Ankle inversion    Ankle eversion     (Blank rows = not tested) In supine eval: Q angle on R 50 deg, Q angle on L 20 deg In supine 04/22/23: Q angle on R 20 deg, Q angle on L   LOWER EXTREMITY MMT:  MMT Right eval Left eval  Hip flexion 4 5  Hip extension 3 3+  Hip abduction 3 4-  Hip adduction    Hip internal rotation 3+ 4+  Hip external rotation 3+ 4  Knee flexion 5 5  Knee extension 5 5  Ankle dorsiflexion    Ankle plantarflexion    Ankle inversion    Ankle eversion     (Blank rows = not tested)  LOWER EXTREMITY SPECIAL TESTS:  Knee special tests: Patellafemoral grind test: positive  and lax but not painful laterally on R  FUNCTIONAL TESTS:  Did not assess  GAIT: Distance walked: 100' Assistive device utilized:  None Level of assistance:  HHA -- uses son for support Comments: Pt with visual impairment   TODAY'S TREATMENT:                                                                                                                              DATE: 04/22/23 Nustep L5 x 6 min  Butterfly stretch x30 sec Supine hip/quad flexor stretch x 30 sec Manual therapy: IASTM & TPR quad Supine  SLR + ER green TB around knees 2x10 Supine SLR green TB 2x10  Bridge green TB around knees 3x10 Attempted SAQ but aggravated pt's knee where she twisted Sidelying clamshell green TB 3x10 Reverse clamshell green  TB 3x10 Seated LAQ green TB around feet 3x10 Seated hamstring stretch x 30 sec Seated clamshell green TB 3x10 Sit<>stand green TB around knees x10 (mild lateral knee pain), with 5# x10  DATE: 04/03/23 Nustep x 5 min level 4 Seated toe and heel raises x 20 Seated LAQ with 2 lbs x 20 both Seated march with 2 lbs x 20 both Seated hip ER with 2 lbs x 20 both Supine quad set x 20 both Supine TKE with half roll x 20 with 2 lbs both Supine TKE with white foam roller x 20 with 2 lbs both Supine SAQ with larger blue bolster x 20 with 2 lbs both Supine SLR with 2 lbs 2 x 20 each (verbal cues for avoiding hip ER) Hook lying clamshell with yellow loop x 20 Side lying clamshell with yellow loop x 20 each side Seated clamshell with yellow loop x 20  Standing hip ER with yellow loop 2 x 10 Ice to right knee post exercise x 10 min  DATE: 02/22/23  See HEP below   PATIENT EDUCATION:  Education details: Exam findings, POC, initial HEP Person educated: Patient and Child(ren) Education method: Explanation, Demonstration, and Handouts Education comprehension: verbalized understanding, tactile cues required, and needs further education  HOME EXERCISE PROGRAM: Access Code: [email or web address] URL: [email or web address] Date: 02/22/2023 Prepared by: Vernon Prey April Kirstie Peri  Exercises - Straight Leg Raise with External Rotation  - 1 x daily - 7 x weekly - 3 sets - 10 reps - Clamshell with Resistance  - 1 x daily - 7 x weekly - 3 sets - 10 reps - Supine Bridge with Resistance Band  - 1 x daily - 7 x weekly - 3 sets - 10 reps - Sit to Stand with Resistance Around Legs  - 1 x daily - 7 x weekly - 3 sets - 10 reps  ASSESSMENT:  CLINICAL IMPRESSION: Pt notes she has been doing very  well until twisting her knee last night. Pt with much improved Q angle compared to eval date. FOTO score has not increased to goal level but pt subjectively notes good improvements with PT. Discussed a few more visits to insure her R knee heals well after twisting it last night with consideration for d/c next session. She still places more weight on her L LE when performing sit<>stands. She has otherwise been meeting her goals.  OBJECTIVE IMPAIRMENTS: Abnormal gait, decreased activity tolerance, decreased balance, decreased endurance, decreased mobility, difficulty walking, decreased strength, improper body mechanics, postural dysfunction, and pain.   ACTIVITY LIMITATIONS: squatting, stairs, transfers, and locomotion level  PARTICIPATION LIMITATIONS: community activity and yard work  PERSONAL FACTORS: Age, Fitness, Past/current experiences, and Time since onset of injury/illness/exacerbation are also affecting patient's functional outcome.   REHAB POTENTIAL: Good  CLINICAL DECISION MAKING: Stable/uncomplicated  EVALUATION COMPLEXITY: Low   GOALS: Goals reviewed with patient? Yes  SHORT TERM GOALS: Target date: 03/22/2023  Pt will be ind with initial HEP Baseline: Goal status: MET 04/03/23  2.  Pt will be able to perform sit to stand without increase in knee valgus Baseline:  Goal status: IN PROGRESS -- less valgus than initial eval but still places most of weight on L LE 04/22/23   LONG TERM GOALS: Target date: 05/20/2023   Pt will be ind with management and progression of HEP Baseline:  Goal status: MET  2.  Pt will have improved Q angle to at least 30 deg on R to demo better alignment Baseline: 50 deg in supine  04/22/23: 20 deg in supine Goal status: MET  3.  Pt will report no pain with sit<>stand Baseline:  Goal status: MET 04/22/23  4.  Pt will be able to squat at least 10# to demo improving functional LE strength Baseline:  04/22/23: Sit<>stand with 5# Goal status: IN  PROGRESS  5.  Pt will have improved FOTO score to >/=73 Baseline:  04/22/23: 60 Goal status: IN PROGRESS   PLAN:  PT FREQUENCY: 1-2x/week (limited due to son's schedule -- he is transportation due to her visual impairment)  PT DURATION: 4 weeks  PLANNED INTERVENTIONS: Therapeutic exercises, Therapeutic activity, Neuromuscular re-education, Balance training, Gait training, Patient/Family education, Self Care, Joint mobilization, Stair training, Aquatic Therapy, Dry Needling, Electrical stimulation, Cryotherapy, Moist heat, Taping, Vasopneumatic device, Ionotophoresis 4mg /ml Dexamethasone, Manual therapy, and Re-evaluation  PLAN FOR NEXT SESSION: Nustep, quad rehab, alignment training. D/C if no continued issues -- otherwise I did re-cert her for another month if needed.   [facility] April Randalyn Rhea Findlay, PT 04/22/23 12:34 PM [facility] [address] [place], [place] [address] Phone # [phone] Fax [phone]

## 2023-04-29 ENCOUNTER — Encounter: Payer: Self-pay | Admitting: Physical Therapy

## 2023-04-29 ENCOUNTER — Ambulatory Visit: Payer: Medicare Other | Admitting: Physical Therapy

## 2023-04-29 DIAGNOSIS — M6281 Muscle weakness (generalized): Secondary | ICD-10-CM

## 2023-04-29 DIAGNOSIS — R252 Cramp and spasm: Secondary | ICD-10-CM | POA: Diagnosis not present

## 2023-04-29 DIAGNOSIS — R262 Difficulty in walking, not elsewhere classified: Secondary | ICD-10-CM | POA: Diagnosis not present

## 2023-04-29 DIAGNOSIS — G8929 Other chronic pain: Secondary | ICD-10-CM | POA: Diagnosis not present

## 2023-04-29 DIAGNOSIS — R2689 Other abnormalities of gait and mobility: Secondary | ICD-10-CM | POA: Diagnosis not present

## 2023-04-29 DIAGNOSIS — M25561 Pain in right knee: Secondary | ICD-10-CM | POA: Diagnosis not present

## 2023-04-29 NOTE — Therapy (Signed)
OUTPATIENT PHYSICAL THERAPY LOWER EXTREMITY TREATMENT NOTE   Patient Name: Stephanie [name] MRN: [id number] DOB:05-24-1946, 77 y.o., female Today's Date: 04/29/2023  END OF SESSION:  PT End of Session - 04/29/23 1020     Visit Number 4    Authorization Type [employer] Medicare    PT Start Time 1017    PT Stop Time 1100    PT Time Calculation (min) 43 min    Activity Tolerance Patient tolerated treatment well    Behavior During Therapy WFL for tasks assessed/performed               Past Medical History:  Diagnosis Date   Anemia    Anxiety    Blindness, legal    Cataract    Complication of anesthesia    Difficulty sleeping    Endometrial cancer (HCC)    Family history of liver cancer    Family history of lung cancer    Fibroids    Glaucoma    History of transfusion    Hypertension    PNA (pneumonia)    PONV (postoperative nausea and vomiting)    Vaginal bleeding    Past Surgical History:  Procedure Laterality Date   ABDOMINAL HYSTERECTOMY N/A 07/21/2013   Procedure: TOTAL HYSTERECTOMY ABDOMINAL ;  Surgeon: Jeannette Corpus, MD;  Location: [facility];  Service: Gynecology;  Laterality: N/A;   DILATION AND CURETTAGE OF UTERUS     LAPAROTOMY N/A 07/21/2013   Procedure: EXPLORATORY LAPAROTOMY/ PELVIC LYMPHADENECTOMY   ;  Surgeon: Jeannette Corpus, MD;  Location: [facility];  Service: Gynecology;  Laterality: N/A;   SALPINGOOPHORECTOMY Bilateral 07/21/2013   Procedure: BILATERAL SALPINGO OOPHORECTOMY;  Surgeon: Jeannette Corpus, MD;  Location: [facility];  Service: Gynecology;  Laterality: Bilateral;   Patient Active Problem List   Diagnosis Date Noted   Anxiety 02/06/2023   Mixed hyperlipidemia 02/06/2023   Chronic knee pain 02/06/2023   Hypokalemia 11/10/2020   Thrombocytopenia (HCC) 08/09/2020   Poor venous access 06/28/2020   Preventive measure 06/28/2020   Leukopenia 02/23/2020   Essential hypertension 01/12/2020   Genetic testing 11/24/2019   Goals of  care, counseling/discussion 10/29/2019   Other constipation 10/29/2019   Family history of lung cancer    Family history of liver cancer    Uncontrolled hypertension 02/01/2015   Tachycardia 02/01/2015   Endometrial cancer (HCC) 06/17/2013   Fibroid uterus 06/01/2013   Thickened endometrium 06/01/2013   Postmenopausal vaginal bleeding 06/01/2013   Follow up 05/22/2013   Severe uncontrolled hypertension 05/15/2013   Generalized anxiety disorder 05/15/2013   SOB (shortness of breath) 04/11/2013   Iron deficiency 04/11/2013   Glaucoma 04/11/2013   Iron deficiency anemia due to chronic blood loss 04/04/2012   Senile nuclear sclerosis 02/18/2012   Primary open-angle glaucoma 02/18/2012   Severe stage glaucoma 02/18/2012    PCP: Alyson Reedy, FNP  REFERRING PROVIDER: Alyson Reedy, FNP  REFERRING DIAG:  [phone] (ICD-10-CM) - Chronic knee pain, unspecified laterality    THERAPY DIAG:  Chronic pain of right knee  Other abnormalities of gait and mobility  Muscle weakness (generalized)  Difficulty in walking, not elsewhere classified  Rationale for Evaluation and Treatment: Rehabilitation  ONSET DATE: ~1 year  SUBJECTIVE:   SUBJECTIVE STATEMENT: I am done with PT today.  I am doing the HEP and don't have pain anymore.  My knee just gets stiff sometimes but loosens back up with movement.  I don't need to use arm rests to get up anymore.  PERTINENT  HISTORY: R knee injury at 18, visual impairment  PAIN:  Are you having pain? Yes: NPRS scale: 0/10 Pain location: R lateral portion of knee Pain description: Aching, popping, catching Aggravating factors: sit to stand, stairs Relieving factors: Not putting weight  PRECAUTIONS: None  WEIGHT BEARING RESTRICTIONS: No  FALLS:  Has patient fallen in last 6 months? No  LIVING ENVIRONMENT: Lives with: lives with their son Lives in: House/apartment Stairs: No Has following equipment at home:  None  OCCUPATION: Retired;   PLOF: Independent  PATIENT GOALS: Improve knee alignment, get back to dancing  NEXT MD VISIT: 02/26/23 Medicare Wellness visit  OBJECTIVE:   DIAGNOSTIC FINDINGS: No knee x-rays on file  PATIENT SURVEYS:  04/29/23: FOTO 82%, met goal FOTO 65; predicted 73  MUSCLE LENGTH: Hamstrings: Right 90 deg; Left 90 deg Thomas test: Did not assess   POSTURE: weight shift left and R knee valgus greater vs L  PALPATION: No overt tenderness to palpation  LOWER EXTREMITY ROM:  Active ROM Right eval Left eval  Hip flexion    Hip extension    Hip abduction    Hip adduction    Hip internal rotation    Hip external rotation    Knee flexion 120 125  Knee extension 0 0  Ankle dorsiflexion    Ankle plantarflexion    Ankle inversion    Ankle eversion     (Blank rows = not tested) In supine eval: Q angle on R 50 deg, Q angle on L 20 deg In supine 04/22/23: Q angle on R 20 deg, Q angle on L   LOWER EXTREMITY MMT:  MMT Right eval Left eval RIGHT 7/15   Hip flexion 4 5 5    Hip extension 3 3+ 4+   Hip abduction 3 4- 5   Hip adduction      Hip internal rotation 3+ 4+ 5   Hip external rotation 3+ 4 5   Knee flexion 5 5 5    Knee extension 5 5 5    Ankle dorsiflexion      Ankle plantarflexion      Ankle inversion      Ankle eversion       (Blank rows = not tested)  LOWER EXTREMITY SPECIAL TESTS:  Knee special tests: Patellafemoral grind test: positive  and lax but not painful laterally on R  FUNCTIONAL TESTS:  Did not assess  GAIT: Distance walked: 100' Assistive device utilized:  None Level of assistance:  HHA -- uses son for support Comments: Pt with visual impairment   TODAY'S TREATMENT:                                                                                                                              DATE: 04/29/23 NuStep L5 9' PT present to review goals and do FOTO Butterfly stretch x30 sec Supine hip/quad flexor stretch x 30  sec MMT LE Sidelying clamshell yellow loop TB 3x10 Bridge yellow loop  TB around knees 1x30 SLR Rt LE 3x10 with yellow loop above knees x30 Sit to stand with yellow loop at knees x20 Squat to chair holding 5lb x 8, holding 10lb x 1 Gave [employer] info   DATE: 04/22/23 Nustep L5 x 6 min  Butterfly stretch x30 sec Supine hip/quad flexor stretch x 30 sec Manual therapy: IASTM & TPR quad Supine SLR + ER green TB around knees 2x10 Supine SLR green TB 2x10  Bridge green TB around knees 3x10 Attempted SAQ but aggravated pt's knee where she twisted Sidelying clamshell green TB 3x10 Reverse clamshell green TB 3x10 Seated LAQ green TB around feet 3x10 Seated hamstring stretch x 30 sec Seated clamshell green TB 3x10 Sit<>stand green TB around knees x10 (mild lateral knee pain), with 5# x10  DATE: 04/03/23 Nustep x 5 min level 4 Seated toe and heel raises x 20 Seated LAQ with 2 lbs x 20 both Seated march with 2 lbs x 20 both Seated hip ER with 2 lbs x 20 both Supine quad set x 20 both Supine TKE with half roll x 20 with 2 lbs both Supine TKE with white foam roller x 20 with 2 lbs both Supine SAQ with larger blue bolster x 20 with 2 lbs both Supine SLR with 2 lbs 2 x 20 each (verbal cues for avoiding hip ER) Hook lying clamshell with yellow loop x 20 Side lying clamshell with yellow loop x 20 each side Seated clamshell with yellow loop x 20  Standing hip ER with yellow loop 2 x 10 Ice to right knee post exercise x 10 min    PATIENT EDUCATION:  Education details: Exam findings, POC, initial HEP Person educated: Patient and Child(ren) Education method: Explanation, Demonstration, and Handouts Education comprehension: verbalized understanding, tactile cues required, and needs further education  HOME EXERCISE PROGRAM: Access Code: [email or web address] URL: [email or web address] Date: 02/22/2023 Prepared by: Vernon Prey April Kirstie Peri  Exercises - Straight Leg Raise with  External Rotation  - 1 x daily - 7 x weekly - 3 sets - 10 reps - Clamshell with Resistance  - 1 x daily - 7 x weekly - 3 sets - 10 reps - Supine Bridge with Resistance Band  - 1 x daily - 7 x weekly - 3 sets - 10 reps - Sit to Stand with Resistance Around Legs  - 1 x daily - 7 x weekly - 3 sets - 10 reps  ASSESSMENT:  CLINICAL IMPRESSION: Pt has met all goals and is ind with HEP.  She has 4+/5 to 5/5 knee strength and is able to dynamically correct knee valgus with squat up to 10lb.  FOTO goal improved to 82% today.  Pt interested in joining [employer] on discharge today to progress and maintain strength.   OBJECTIVE IMPAIRMENTS: Abnormal gait, decreased activity tolerance, decreased balance, decreased endurance, decreased mobility, difficulty walking, decreased strength, improper body mechanics, postural dysfunction, and pain.   ACTIVITY LIMITATIONS: squatting, stairs, transfers, and locomotion level  PARTICIPATION LIMITATIONS: community activity and yard work  PERSONAL FACTORS: Age, Fitness, Past/current experiences, and Time since onset of injury/illness/exacerbation are also affecting patient's functional outcome.   REHAB POTENTIAL: Good  CLINICAL DECISION MAKING: Stable/uncomplicated  EVALUATION COMPLEXITY: Low   GOALS: Goals reviewed with patient? Yes  SHORT TERM GOALS: Target date: 03/22/2023  Pt will be ind with initial HEP Baseline: Goal status: MET 04/03/23  2.  Pt will be able to perform sit to stand without increase in knee valgus Baseline:  Goal  status: MET-- 7/15  LONG TERM GOALS: Target date: 05/20/2023   Pt will be ind with management and progression of HEP Baseline:  Goal status: MET  2.  Pt will have improved Q angle to at least 30 deg on R to demo better alignment Baseline: 50 deg in supine 04/22/23: 20 deg in supine Goal status: MET  3.  Pt will report no pain with sit<>stand Baseline:  Goal status: MET 04/22/23  4.  Pt will be able to squat at  least 10# to demo improving functional LE strength Baseline:  04/22/23: Sit<>stand with 5# Goal status: MET 7/15 WITH 10LB  5.  Pt will have improved FOTO score to >/=73 Baseline:  04/22/23: 60 Goal status: MET 82% 7/15   PLAN:  PT FREQUENCY: 1-2x/week (limited due to son's schedule -- he is transportation due to her visual impairment)  PT DURATION: 4 weeks  PLANNED INTERVENTIONS: Therapeutic exercises, Therapeutic activity, Neuromuscular re-education, Balance training, Gait training, Patient/Family education, Self Care, Joint mobilization, Stair training, Aquatic Therapy, Dry Needling, Electrical stimulation, Cryotherapy, Moist heat, Taping, Vasopneumatic device, Ionotophoresis 4mg /ml Dexamethasone, Manual therapy, and Re-evaluation  PLAN FOR NEXT SESSION: D/C  PHYSICAL THERAPY DISCHARGE SUMMARY  Visits from Start of Care: 4  Current functional level related to goals / functional outcomes: See above   Remaining deficits: Met all goals   Education / Equipment: HEP   Patient agrees to discharge. Patient goals were met. Patient is being discharged due to meeting the stated rehab goals.  Morton Peters, PT 04/29/23 11:04 AM   [facility] [address], [address] [place], [place] [address] Phone # [phone] Fax [phone]

## 2023-05-02 ENCOUNTER — Telehealth (HOSPITAL_BASED_OUTPATIENT_CLINIC_OR_DEPARTMENT_OTHER): Payer: Self-pay | Admitting: *Deleted

## 2023-05-02 ENCOUNTER — Encounter (HOSPITAL_BASED_OUTPATIENT_CLINIC_OR_DEPARTMENT_OTHER): Payer: Self-pay | Admitting: *Deleted

## 2023-05-02 NOTE — Telephone Encounter (Signed)
 LVM to offer colon cancer screening. Mychart message sent

## 2023-05-27 ENCOUNTER — Inpatient Hospital Stay: Payer: Medicare Other

## 2023-05-27 ENCOUNTER — Inpatient Hospital Stay: Payer: Medicare Other | Attending: Family Medicine

## 2023-05-27 ENCOUNTER — Telehealth: Payer: Self-pay

## 2023-05-27 ENCOUNTER — Other Ambulatory Visit (HOSPITAL_BASED_OUTPATIENT_CLINIC_OR_DEPARTMENT_OTHER): Payer: Self-pay

## 2023-05-27 ENCOUNTER — Other Ambulatory Visit: Payer: Self-pay

## 2023-05-27 ENCOUNTER — Ambulatory Visit (HOSPITAL_COMMUNITY): Payer: Medicare Other

## 2023-05-27 DIAGNOSIS — Z8542 Personal history of malignant neoplasm of other parts of uterus: Secondary | ICD-10-CM | POA: Insufficient documentation

## 2023-05-27 NOTE — Telephone Encounter (Signed)
Called and reviewed upcoming appts. Radiology scheduling called her to schedule CT. She is aware of appts.

## 2023-05-27 NOTE — Telephone Encounter (Signed)
Returned her call. She tested positive for covid at home on Saturday. PCP sent Paxlovid Rx for her. She is feeling better and had just a cough at times. CT and labs canceled for today. 8/15 appt canceled with MD.

## 2023-05-27 NOTE — Progress Notes (Signed)
Received patients triage note, called to check on her and make sure she did not need to schedule an appointment. States she received a Paxlovid prescription, and is starting to feel a little better. Has a slight cough, and some congestion.

## 2023-05-27 NOTE — Telephone Encounter (Signed)
Please reschedule labs and CT for 2 weeks and see me in 3 weeks Call son for details

## 2023-05-30 ENCOUNTER — Ambulatory Visit: Payer: Medicare Other | Admitting: Hematology and Oncology

## 2023-06-09 ENCOUNTER — Other Ambulatory Visit (HOSPITAL_BASED_OUTPATIENT_CLINIC_OR_DEPARTMENT_OTHER): Payer: Self-pay | Admitting: Family Medicine

## 2023-06-10 ENCOUNTER — Ambulatory Visit (HOSPITAL_COMMUNITY)
Admission: RE | Admit: 2023-06-10 | Discharge: 2023-06-10 | Disposition: A | Discharge status: Home / Self Care | Payer: Medicare Other | Source: Ambulatory Visit | Attending: Hematology and Oncology | Admitting: Hematology and Oncology

## 2023-06-10 ENCOUNTER — Inpatient Hospital Stay: Payer: Medicare Other

## 2023-06-10 ENCOUNTER — Encounter (HOSPITAL_COMMUNITY): Payer: Self-pay

## 2023-06-10 DIAGNOSIS — Z8542 Personal history of malignant neoplasm of other parts of uterus: Secondary | ICD-10-CM | POA: Diagnosis present

## 2023-06-10 DIAGNOSIS — C541 Malignant neoplasm of endometrium: Secondary | ICD-10-CM | POA: Insufficient documentation

## 2023-06-10 DIAGNOSIS — I10 Essential (primary) hypertension: Secondary | ICD-10-CM | POA: Insufficient documentation

## 2023-06-10 LAB — CBC WITH DIFFERENTIAL (CANCER CENTER ONLY)
Abs Immature Granulocytes: 0.02 10*3/uL (ref 0.00–0.07)
Basophils Absolute: 0 10*3/uL (ref 0.0–0.1)
Basophils Relative: 1 %
Eosinophils Absolute: 0.2 10*3/uL (ref 0.0–0.5)
Eosinophils Relative: 3 %
HCT: 41.1 % (ref 36.0–46.0)
Hemoglobin: 14.8 g/dL (ref 12.0–15.0)
Immature Granulocytes: 0 %
Lymphocytes Relative: 27 %
Lymphs Abs: 1.7 10*3/uL (ref 0.7–4.0)
MCH: 31.4 pg (ref 26.0–34.0)
MCHC: 36 g/dL (ref 30.0–36.0)
MCV: 87.1 fL (ref 80.0–100.0)
Monocytes Absolute: 0.5 10*3/uL (ref 0.1–1.0)
Monocytes Relative: 8 %
Neutro Abs: 3.8 10*3/uL (ref 1.7–7.7)
Neutrophils Relative %: 61 %
Platelet Count: 180 10*3/uL (ref 150–400)
RBC: 4.72 MIL/uL (ref 3.87–5.11)
RDW: 13.2 % (ref 11.5–15.5)
WBC Count: 6.3 10*3/uL (ref 4.0–10.5)
nRBC: 0 % (ref 0.0–0.2)

## 2023-06-10 LAB — CMP (CANCER CENTER ONLY)
ALT: 14 U/L (ref 0–44)
AST: 16 U/L (ref 15–41)
Albumin: 4.5 g/dL (ref 3.5–5.0)
Alkaline Phosphatase: 77 U/L (ref 38–126)
Anion gap: 7 (ref 5–15)
BUN: 16 mg/dL (ref 8–23)
CO2: 26 mmol/L (ref 22–32)
Calcium: 9.4 mg/dL (ref 8.9–10.3)
Chloride: 108 mmol/L (ref 98–111)
Creatinine: 0.81 mg/dL (ref 0.44–1.00)
GFR, Estimated: >60 mL/min (ref >60)
Glucose, Bld: 107 mg/dL — ABNORMAL HIGH (ref 70–99)
Potassium: 4 mmol/L (ref 3.5–5.1)
Sodium: 141 mmol/L (ref 135–145)
Total Bilirubin: 0.7 mg/dL (ref 0.3–1.2)
Total Protein: 8 g/dL (ref 6.5–8.1)

## 2023-06-10 MED ORDER — SODIUM CHLORIDE (PF) 0.9 % IJ SOLN
INTRAMUSCULAR | Status: AC
  Filled 2023-06-10: qty 50

## 2023-06-10 MED ORDER — IOHEXOL 300 MG/ML  SOLN
100.0000 mL | Freq: Once | INTRAMUSCULAR | Status: AC | PRN
  Administered 2023-06-10: 100 mL via INTRAVENOUS

## 2023-06-18 ENCOUNTER — Inpatient Hospital Stay: Payer: Medicare Other | Attending: Hematology and Oncology | Admitting: Hematology and Oncology

## 2023-06-18 ENCOUNTER — Encounter: Payer: Self-pay | Admitting: Hematology and Oncology

## 2023-06-18 VITALS — BP 179/94 | HR 105 | Temp 97.6°F | Resp 18 | Ht 66.0 in | Wt 172.6 lb

## 2023-06-18 DIAGNOSIS — C541 Malignant neoplasm of endometrium: Secondary | ICD-10-CM

## 2023-06-18 DIAGNOSIS — I10 Essential (primary) hypertension: Secondary | ICD-10-CM | POA: Insufficient documentation

## 2023-06-18 DIAGNOSIS — Z90710 Acquired absence of both cervix and uterus: Secondary | ICD-10-CM | POA: Insufficient documentation

## 2023-06-18 DIAGNOSIS — Z79899 Other long term (current) drug therapy: Secondary | ICD-10-CM | POA: Insufficient documentation

## 2023-06-18 DIAGNOSIS — Z8542 Personal history of malignant neoplasm of other parts of uterus: Secondary | ICD-10-CM | POA: Diagnosis present

## 2023-06-18 NOTE — Progress Notes (Signed)
[facility] OFFICE PROGRESS NOTE  Patient Care Team: Alyson Reedy, FNP as PCP - General (Family Medicine) Loraine Grip, Harrietta Guardian, MD as Referring Physician (Ophthalmology) Antionette Char, MD as Consulting Physician (Obstetrics and Gynecology)  ASSESSMENT & PLAN:  Endometrial cancer [facility]) I reviewed her CT imaging which show no evidence of disease The patient has discontinue immunotherapy for some time and she had normal imaging study for last 2 years I plan to see her in a year for further follow-up and repeat imaging study She is educated to watch out for signs and symptoms of disease recurrence  Essential hypertension Her blood pressure is typically normal at home but elevated today likely due to anxiety Observe closely for now  Orders Placed This Encounter  Procedures   CT ABDOMEN PELVIS W CONTRAST    Standing Status:   Future    Standing Expiration Date:   06/17/2024    Order Specific Question:   If indicated for the ordered procedure, I authorize the administration of contrast media per Radiology protocol    Answer:   Yes    Order Specific Question:   Does the patient have a contrast media/X-ray dye allergy?    Answer:   No    Order Specific Question:   Preferred imaging location?    Answer:   [facility]    Order Specific Question:   If indicated for the ordered procedure, I authorize the administration of oral contrast media per Radiology protocol    Answer:   Yes   CBC with Differential (Cancer Center Only)    Standing Status:   Future    Standing Expiration Date:   06/17/2024   CMP (Cancer Center only)    Standing Status:   Future    Standing Expiration Date:   06/17/2024    All questions were answered. The patient knows to call the clinic with any problems, questions or concerns. The total time spent in the appointment was 30 minutes encounter with patients including review of chart and various tests results, discussions about plan of care and  coordination of care plan   Artis Delay, MD 06/18/2023 10:16 AM  INTERVAL HISTORY: Please see below for problem oriented charting. she returns for surveillance follow-up with her son She is doing well She denies signs or symptoms of cancer recurrence Denies abdominal pain no changes in bowel habits We discussed results of her imaging and future follow-up  REVIEW OF SYSTEMS:   Constitutional: Denies fevers, chills or abnormal weight loss Eyes: Denies blurriness of vision Ears, nose, mouth, throat, and face: Denies mucositis or sore throat Respiratory: Denies cough, dyspnea or wheezes Cardiovascular: Denies palpitation, chest discomfort or lower extremity swelling Gastrointestinal:  Denies nausea, heartburn or change in bowel habits Skin: Denies abnormal skin rashes Lymphatics: Denies new lymphadenopathy or easy bruising Neurological:Denies numbness, tingling or new weaknesses Behavioral/Psych: Mood is stable, no new changes  All other systems were reviewed with the patient and are negative.  I have reviewed the past medical history, past surgical history, social history and family history with the patient and they are unchanged from previous note.  ALLERGIES:  is allergic to iron, iron dextran, penicillins, amoxicillin, ampicillin, codeine, diamox [acetazolamide], penicillin g, and other.  MEDICATIONS:  Current Outpatient Medications  Medication Sig Dispense Refill   amLODipine (NORVASC) 5 MG tablet Take 1 tablet by mouth twice daily 180 tablet 0   atorvastatin (LIPITOR) 20 MG tablet Take 1 tablet (20 mg total) by mouth daily. 90 tablet  1   benazepril (LOTENSIN) 20 MG tablet Take 1 tablet (20 mg total) by mouth daily. 90 tablet 1   brimonidine (ALPHAGAN) 0.2 % ophthalmic solution      busPIRone (BUSPAR) 7.5 MG tablet Take 1 tablet (7.5 mg total) by mouth 2 (two) times daily. 90 tablet 1   dorzolamide-timolol (COSOPT) 22.3-6.8 MG/ML ophthalmic solution Place 1 drop into the left eye  2 (two) times daily.     metoprolol tartrate (LOPRESSOR) 25 MG tablet Take 1 tablet (25 mg total) by mouth 2 (two) times daily. 180 tablet 1   PAXLOVID, 300/100, 20 x 150 MG & 10 x 100MG  TBPK Take by mouth.     ROCKLATAN 0.02-0.005 % SOLN Apply to eye.     No current facility-administered medications for this visit.    SUMMARY OF ONCOLOGIC HISTORY: Oncology History Overview Note  MMR - Abnormal   Endometrial cancer (HCC)  06/01/2013 Initial Diagnosis   Endometrial cancer   06/01/2013 Initial Biopsy   EMB for AUB Endometrium, biopsy - POSITIVE FOR ENDOMETRIAL ADENOCARCINOMA, SEE COMMENT. Microscopic Comment As sampled the endometrial adenocarcinoma appears to be endometrioid type, FIGO Grade I.   07/21/2013 Surgery   PREOPERATIVE DIAGNOSIS: Grade 1 endometrial carcinoma. Uterine fibroids    PROCEDURE: Total abdominal hysterectomy, bilateral salpingo-oophorectomy, pelvic lymphadenectomy   SURGEON: De Blanch, M.D  SURGICAL FINDINGS: At the time of exploratory laparotomy the uterus was approximately 16-18 weeks size with multiple subserosal fibroids. Tubes and ovaries were normal. On frozen section patient had endometrial carcinoma invading only the inner half of the myometrium. Exploration the upper abdomen revealed no evidence of metastatic disease. There was no pelvic or para-aortic lymphadenopathy.     07/21/2013 Pathologic Stage   1. Uterus +/- tubes/ovaries, neoplastic - INVASIVE ENDOMETRIOID CARCINOMA, FIGO GRADE II, WITH SQUAMOUS DIFFERENTIATION, CONFINED WITHIN INNER HALF OF THE MYOMETRIUM. - LEIOMYOMATA AND ADENOMYOSIS. - CERVIX: BENIGN SQUAMOUS MUCOSA AND ENDOCERVICAL MUCOSA, NO DYSPLASIA OR MALIGNANCY. - BILATERAL OVARIES AND FALLOPIAN TUBES: NO PATHOLOGIC ABNORMALITIES. 2. Lymph nodes, regional resection, left pelvic - FOUR LYMPH NODES, NO EVIDENCE OF METASTATIC CARCINOMA (0/4). 3. Lymph nodes, regional resection, right pelvic - THREE LYMPH NODES, NEGATIVE  FOR METASTATIC CARCINOMA (0/3). Microscopic Comment 1. ONCOLOGY TABLE - UTERUS, CARCINOMA OR CARCINOSARCOMA Specimen: Uterus, cervix, bilateral ovaries and fallopian tubes Procedure: Total hysterectomy and bilateral salpingo-oophorectomy Lymph node sampling performed: Yes Specimen integrity: Intact Maximum tumor size (cm): 2.4 cm, gross measurement Histologic type: Invasive endometrioid carcinoma with squamous differentiation Grade: FIGO II Myometrial invasion: 1.1 cm where myometrium is 3.7 cm in thickness Cervical stromal involvement: Not identified Extent of involvement of other organs: No Lymph vascular invasion: Not identified Peritoneal washings: Negative (Please correlate with [id number]) Lymph nodes: number examined 7; number positive 0 Pelvic lymph nodes: 0 involved of 7 lymph nodes Para-aortic lymph nodes: N/A TNM code: pT1a, pN0 FIGO Stage (based on pathologic findings, needs clinical correlation): IA   09/22/2019 Imaging   CT A/P: There is a left paraspinous mass which is poorly defined and low-density measuring 3 x 2.2 by 5.5 cm. Deviates the left ureter laterally. No additional periaortic masses are identified.   10/13/2019 Pathology Results   A. LYMPH NODE, LEFT PARASPINAL, NEEDLE CORE BIOPSY: - Poorly differentiated carcinoma. COMMENT: Poorly differentiated carcinoma is present in the background of a majority of necrosis and fibrosis tissue. No distinct nodal tissue is identified. Immunohistochemistry for CK7, PAX 8 and ER is positive. CK20, TTF-1, CDX-2 and GATA-3 are negative. The immunophenotype is compatible with the provided  clinical history of gynecologic cancer.   10/19/2019 Cancer Staging   Staging form: Corpus Uteri - Carcinoma and Carcinosarcoma, AJCC 8th Edition - Pathologic: Stage IIIC2 (pT1a, pN2a, cM0) - Signed by Artis Delay, MD on 10/29/2019    Genetic Testing   Patient has genetic testing done for MMR. Results revealed patient has the following  mutation(s): MMR - Abnormal   10/27/2019 PET scan   1. Poorly marginated hypermetabolic nodal conglomerate in the left para-aortic and left common iliac chains, compatible with nodal metastatic disease. 2. No additional hypermetabolic sites of metastatic disease. 3. Right upper lobe 2 mm solid pulmonary nodule, below PET resolution, recommend attention on follow-up chest CT in 3-6 months. 4. Chronic findings include: Aortic Atherosclerosis (ICD10-I70.0). Coronary atherosclerosis. Mild left colonic diverticulosis. Mild biliary and pancreatic duct dilation of uncertain etiology, unchanged since 09/22/2019 CT.   11/10/2019 - 06/08/2021 Chemotherapy   Patient is on Treatment Plan : UTERINE Pembrolizumab q42d      02/22/2020 PET scan   1. Significant partial metabolic response. Left para-aortic and left common iliac adenopathy is decreased in size and significantly decreased in metabolism. 2. No new or progressive hypermetabolic metastatic disease. 3. Aortic Atherosclerosis (ICD10-I70.0). Additional chronic findings as detailed.   05/16/2020 Imaging   Further decrease in mild left paraaortic and left common iliac lymphadenopathy.   No new or progressive disease within the abdomen or pelvis.   Aortic Atherosclerosis (ICD10-I70.0).     09/19/2020 Imaging   1. Mild response to therapy as evidenced by decrease in abdominopelvic adenopathy. 2. No new sites of disease. 3. Hysterectomy. 4.  Aortic Atherosclerosis (ICD10-I70.0).     01/31/2021 Imaging   1. Stable exam. Unchanged appearance borderline abdominopelvic adenopathy. 2. No new sites of disease. 3. Status post hysterectomy with stable, mild soft tissue thickening along the right side of vaginal cuff. 4.  Aortic Atherosclerosis (ICD10-I70.0).   07/20/2021 Imaging   Continued decrease in size of LEFT periaortic/common iliac soft tissue and post treatment changes.   No new or progressive findings.   Stable common bile duct dilation, up to  11 mm. Intrahepatic biliary duct distension similar to previous imaging. Correlate with any symptoms that would necessitate further workup but this finding is unchanged dating back to December of 2020.   Aortic Atherosclerosis (ICD10-I70.0).   11/18/2021 Imaging   1. Status post hysterectomy. No evidence of pelvic mass or discrete lymphadenopathy. 2. Post treatment appearance of soft tissue at the left aspect of the inferior aorta and left common iliac artery is unchanged. 3. Unchanged common bile duct dilatation up to 1.0 cm with associated intrahepatic biliary ductal dilatation. No gallstones or gallbladder wall thickening. In the absence of signs and symptoms of cholestasis, this is of doubtful clinical significance given stability.   Aortic Atherosclerosis (ICD10-I70.0).   05/21/2022 Imaging   Stable exam. No evidence of recurrent or metastatic carcinoma within the abdomen or pelvis.   Aortic Atherosclerosis (ICD10-I70.0).   06/15/2023 Imaging   CT ABDOMEN PELVIS W CONTRAST  Result Date: 06/15/2023 CLINICAL DATA:  Endometrial cancer, status post chemotherapy/XRT EXAM: CT ABDOMEN AND PELVIS WITH CONTRAST TECHNIQUE: Multidetector CT imaging of the abdomen and pelvis was performed using the standard protocol following bolus administration of intravenous contrast. RADIATION DOSE REDUCTION: This exam was performed according to the departmental dose-optimization program which includes automated exposure control, adjustment of the mA and/or kV according to patient size and/or use of iterative reconstruction technique. CONTRAST:  OMNIPAQUE IOHEXOL 300 MG/ML  SOLN COMPARISON:  05/21/2022 FINDINGS:  Lower chest: Lung bases are clear. Hepatobiliary: Two right hepatic lobe cysts measuring up to 11 mm (series 2/image 21), benign. Gallbladder is unremarkable. No intrahepatic ductal dilatation. Common duct measures 12 mm, mildly prominent, but smoothly tapering at the ampulla. This is unchanged from the  prior. No choledocholithiasis is evident on CT. Pancreas: Within normal limits. Spleen: Within normal limits. Adrenals/Urinary Tract: Adrenal glands are within normal limits. 6 mm cyst in the posterior interpolar left kidney, too small to characterize but almost certainly benign. No follow-up is recommended. Right kidney is within normal limits. No hydronephrosis. Bladder is within normal limits. Stomach/Bowel: Stomach is within normal limits. No evidence of bowel obstruction. Appendix is not discretely visualized. No colonic wall thickening or inflammatory changes. Vascular/Lymphatic: No evidence of abdominal aortic aneurysm. Atherosclerotic calcifications of the abdominal aorta and branch vessels, although vessels remain patent. No suspicious abdominopelvic lymphadenopathy. Reproductive: Status post hysterectomy. No adnexal masses. Other: No abdominopelvic ascites. Musculoskeletal: Visualized osseous structures are within normal limits. IMPRESSION: Status post hysterectomy. No evidence of recurrent or metastatic disease. Electronically Signed   By: Charline Bills M.D.   On: 06/15/2023 00:00        PHYSICAL EXAMINATION: ECOG PERFORMANCE STATUS: 0 - Asymptomatic  Vitals:   06/18/23 0948  BP: (!) 179/94  Pulse: (!) 105  Resp: 18  Temp: 97.6 F (36.4 C)  SpO2: 99%   Filed Weights   06/18/23 0948  Weight: 172 lb 9.6 oz (78.3 kg)    GENERAL:alert, no distress and comfortable   NEURO: alert & oriented x 3 with fluent speech, no focal motor/sensory deficits  LABORATORY DATA:  I have reviewed the data as listed    Component Value Date/Time   NA 141 06/10/2023 1351   NA 139 03/03/2019 0934   NA 142 07/09/2013 1144   K 4.0 06/10/2023 1351   K 3.7 07/09/2013 1144   CL 108 06/10/2023 1351   CO2 26 06/10/2023 1351   CO2 23 07/09/2013 1144   GLUCOSE 107 (H) 06/10/2023 1351   GLUCOSE 115 07/09/2013 1144   BUN 16 06/10/2023 1351   BUN 25 03/03/2019 0934   BUN 14.4 07/09/2013 1144    CREATININE 0.81 06/10/2023 1351   CREATININE 0.82 01/02/2017 1159   CREATININE 0.8 07/09/2013 1144   CALCIUM 9.4 06/10/2023 1351   CALCIUM 9.3 07/09/2013 1144   PROT 8.0 06/10/2023 1351   PROT 8.0 03/03/2019 0934   PROT 7.8 07/09/2013 1144   ALBUMIN 4.5 06/10/2023 1351   ALBUMIN 4.7 03/03/2019 0934   ALBUMIN 3.7 07/09/2013 1144   AST 16 06/10/2023 1351   AST 9 07/09/2013 1144   ALT 14 06/10/2023 1351   ALT <6 07/09/2013 1144   ALKPHOS 77 06/10/2023 1351   ALKPHOS 58 07/09/2013 1144   BILITOT 0.7 06/10/2023 1351   BILITOT 0.40 07/09/2013 1144   GFRNONAA >60 06/10/2023 1351   GFRNONAA 74 02/01/2015 1238   GFRAA >60 06/28/2020 1031   GFRAA 85 02/01/2015 1238    No results found for: "SPEP", "UPEP"  Lab Results  Component Value Date   WBC 6.3 06/10/2023   NEUTROABS 3.8 06/10/2023   HGB 14.8 06/10/2023   HCT 41.1 06/10/2023   MCV 87.1 06/10/2023   PLT 180 06/10/2023      Chemistry      Component Value Date/Time   NA 141 06/10/2023 1351   NA 139 03/03/2019 0934   NA 142 07/09/2013 1144   K 4.0 06/10/2023 1351   K 3.7 07/09/2013 1144  CL 108 06/10/2023 1351   CO2 26 06/10/2023 1351   CO2 23 07/09/2013 1144   BUN 16 06/10/2023 1351   BUN 25 03/03/2019 0934   BUN 14.4 07/09/2013 1144   CREATININE 0.81 06/10/2023 1351   CREATININE 0.82 01/02/2017 1159   CREATININE 0.8 07/09/2013 1144      Component Value Date/Time   CALCIUM 9.4 06/10/2023 1351   CALCIUM 9.3 07/09/2013 1144   ALKPHOS 77 06/10/2023 1351   ALKPHOS 58 07/09/2013 1144   AST 16 06/10/2023 1351   AST 9 07/09/2013 1144   ALT 14 06/10/2023 1351   ALT <6 07/09/2013 1144   BILITOT 0.7 06/10/2023 1351   BILITOT 0.40 07/09/2013 1144       RADIOGRAPHIC STUDIES: I have personally reviewed the radiological images as listed and agreed with the findings in the report. CT ABDOMEN PELVIS W CONTRAST  Result Date: 06/15/2023 CLINICAL DATA:  Endometrial cancer, status post chemotherapy/XRT EXAM: CT ABDOMEN  AND PELVIS WITH CONTRAST TECHNIQUE: Multidetector CT imaging of the abdomen and pelvis was performed using the standard protocol following bolus administration of intravenous contrast. RADIATION DOSE REDUCTION: This exam was performed according to the departmental dose-optimization program which includes automated exposure control, adjustment of the mA and/or kV according to patient size and/or use of iterative reconstruction technique. CONTRAST:  OMNIPAQUE IOHEXOL 300 MG/ML  SOLN COMPARISON:  05/21/2022 FINDINGS: Lower chest: Lung bases are clear. Hepatobiliary: Two right hepatic lobe cysts measuring up to 11 mm (series 2/image 21), benign. Gallbladder is unremarkable. No intrahepatic ductal dilatation. Common duct measures 12 mm, mildly prominent, but smoothly tapering at the ampulla. This is unchanged from the prior. No choledocholithiasis is evident on CT. Pancreas: Within normal limits. Spleen: Within normal limits. Adrenals/Urinary Tract: Adrenal glands are within normal limits. 6 mm cyst in the posterior interpolar left kidney, too small to characterize but almost certainly benign. No follow-up is recommended. Right kidney is within normal limits. No hydronephrosis. Bladder is within normal limits. Stomach/Bowel: Stomach is within normal limits. No evidence of bowel obstruction. Appendix is not discretely visualized. No colonic wall thickening or inflammatory changes. Vascular/Lymphatic: No evidence of abdominal aortic aneurysm. Atherosclerotic calcifications of the abdominal aorta and branch vessels, although vessels remain patent. No suspicious abdominopelvic lymphadenopathy. Reproductive: Status post hysterectomy. No adnexal masses. Other: No abdominopelvic ascites. Musculoskeletal: Visualized osseous structures are within normal limits. IMPRESSION: Status post hysterectomy. No evidence of recurrent or metastatic disease. Electronically Signed   By: Charline Bills M.D.   On: 06/15/2023 00:00

## 2023-06-18 NOTE — Assessment & Plan Note (Signed)
I reviewed her CT imaging which show no evidence of disease The patient has discontinue immunotherapy for some time and she had normal imaging study for last 2 years I plan to see her in a year for further follow-up and repeat imaging study She is educated to watch out for signs and symptoms of disease recurrence

## 2023-06-18 NOTE — Assessment & Plan Note (Signed)
Her blood pressure is typically normal at home but elevated today likely due to anxiety Observe closely for now

## 2023-06-28 ENCOUNTER — Other Ambulatory Visit: Payer: Medicare Other | Admitting: Pharmacist

## 2023-06-28 NOTE — Patient Instructions (Signed)
Merida,  Thank you for speaking with me today! Your home blood pressures are looking great.   Have a great weekend, Elmarie Shiley, PharmD, BCPS Clinical Pharmacist [facility]

## 2023-06-28 NOTE — Progress Notes (Signed)
Patient appearing on report for [employer] - Hypertension Control report due to last documented ambulatory blood pressure of 179/94 on 06/18/23. Next appointment with PCP is 09/03/23   Outreached patient to discuss hypertension control and medication management.   Current antihypertensives: amlodipine 5mg  BID, benazepril 20mg  daily, metoprolol 25mg  BID  Patient has an automated upper arm home BP machine. She checks twice daily and keeps a written log.   Current blood pressure readings: 116/74 HR 70, 124/80 HR 78   Patient denies hypotensive signs and symptoms including dizziness, lightheadedness.  Patient denies hypertensive symptoms including headache, chest pain, shortness of breath.  Patient denies side effects related to medication    Assessment/Plan: - Currently controlled, home readings very well controlled, patient has white coat HTN which results in elevated office readings. - - Reviewed goal blood pressure <140/90 - Recommend continue current regimen  Lynnda Shields, PharmD, BCPS Clinical Pharmacist [facility]

## 2023-07-25 ENCOUNTER — Ambulatory Visit: Payer: Medicare Other | Admitting: Family Medicine

## 2023-08-29 ENCOUNTER — Other Ambulatory Visit (HOSPITAL_BASED_OUTPATIENT_CLINIC_OR_DEPARTMENT_OTHER): Payer: Self-pay | Admitting: *Deleted

## 2023-08-29 ENCOUNTER — Telehealth (HOSPITAL_BASED_OUTPATIENT_CLINIC_OR_DEPARTMENT_OTHER): Payer: Self-pay | Admitting: Family Medicine

## 2023-08-29 ENCOUNTER — Other Ambulatory Visit (HOSPITAL_BASED_OUTPATIENT_CLINIC_OR_DEPARTMENT_OTHER): Payer: Self-pay

## 2023-08-29 ENCOUNTER — Other Ambulatory Visit (HOSPITAL_BASED_OUTPATIENT_CLINIC_OR_DEPARTMENT_OTHER): Payer: Medicare Other

## 2023-08-29 DIAGNOSIS — Z0000 Encounter for general adult medical examination without abnormal findings: Secondary | ICD-10-CM

## 2023-08-29 DIAGNOSIS — I10 Essential (primary) hypertension: Secondary | ICD-10-CM | POA: Diagnosis not present

## 2023-08-29 DIAGNOSIS — D72819 Decreased white blood cell count, unspecified: Secondary | ICD-10-CM | POA: Diagnosis not present

## 2023-08-29 DIAGNOSIS — E876 Hypokalemia: Secondary | ICD-10-CM | POA: Diagnosis not present

## 2023-08-29 DIAGNOSIS — E611 Iron deficiency: Secondary | ICD-10-CM | POA: Diagnosis not present

## 2023-08-29 MED ORDER — AMLODIPINE BESYLATE 5 MG PO TABS: 5.0000 mg | ORAL_TABLET | Freq: Two times a day (BID) | ORAL | 0 refills | Status: DC

## 2023-08-29 NOTE — Telephone Encounter (Signed)
Pt medication sent to pharmacy  

## 2023-08-29 NOTE — Telephone Encounter (Signed)
Please send in Amlodipine --she will be out before her appointment s

## 2023-08-30 LAB — COMPREHENSIVE METABOLIC PANEL
ALT: 14 [IU]/L (ref 0–32)
AST: 18 [IU]/L (ref 0–40)
Albumin: 4.4 g/dL (ref 3.8–4.8)
Alkaline Phosphatase: 108 [IU]/L (ref 44–121)
BUN/Creatinine Ratio: 20 (ref 12–28)
BUN: 15 mg/dL (ref 8–27)
Bilirubin Total: 0.6 mg/dL (ref 0.0–1.2)
CO2: 22 mmol/L (ref 20–29)
Calcium: 9.3 mg/dL (ref 8.7–10.3)
Chloride: 106 mmol/L (ref 96–106)
Creatinine, Ser: 0.75 mg/dL (ref 0.57–1.00)
Globulin, Total: 2.7 g/dL (ref 1.5–4.5)
Glucose: 105 mg/dL — ABNORMAL HIGH (ref 70–99)
Potassium: 3.6 mmol/L (ref 3.5–5.2)
Sodium: 143 mmol/L (ref 134–144)
Total Protein: 7.1 g/dL (ref 6.0–8.5)
eGFR: 83 mL/min/{1.73_m2} (ref >59)

## 2023-08-30 LAB — CBC WITH DIFFERENTIAL/PLATELET
Basophils Absolute: 0 10*3/uL (ref 0.0–0.2)
Basos: 1 %
EOS (ABSOLUTE): 0.1 10*3/uL (ref 0.0–0.4)
Eos: 2 %
Hematocrit: 41.8 % (ref 34.0–46.6)
Hemoglobin: 14.3 g/dL (ref 11.1–15.9)
Immature Grans (Abs): 0 10*3/uL (ref 0.0–0.1)
Immature Granulocytes: 0 %
Lymphocytes Absolute: 1.6 10*3/uL (ref 0.7–3.1)
Lymphs: 27 %
MCH: 30.8 pg (ref 26.6–33.0)
MCHC: 34.2 g/dL (ref 31.5–35.7)
MCV: 90 fL (ref 79–97)
Monocytes Absolute: 0.6 10*3/uL (ref 0.1–0.9)
Monocytes: 9 %
Neutrophils Absolute: 3.8 10*3/uL (ref 1.4–7.0)
Neutrophils: 61 %
Platelets: 156 10*3/uL (ref 150–450)
RBC: 4.64 x10E6/uL (ref 3.77–5.28)
RDW: 13.2 % (ref 11.7–15.4)
WBC: 6.2 10*3/uL (ref 3.4–10.8)

## 2023-08-30 LAB — TSH RFX ON ABNORMAL TO FREE T4: TSH: 1.56 u[IU]/mL (ref 0.450–4.500)

## 2023-08-30 LAB — LIPID PANEL
Chol/HDL Ratio: 2.1 ratio (ref 0.0–4.4)
Cholesterol, Total: 154 mg/dL (ref 100–199)
HDL: 72 mg/dL (ref >39)
LDL Chol Calc (NIH): 65 mg/dL (ref 0–99)
Triglycerides: 92 mg/dL (ref 0–149)
VLDL Cholesterol Cal: 17 mg/dL (ref 5–40)

## 2023-08-30 LAB — HEMOGLOBIN A1C
Est. average glucose Bld gHb Est-mCnc: 103 mg/dL
Hgb A1c MFr Bld: 5.2 % (ref 4.8–5.6)

## 2023-09-03 ENCOUNTER — Encounter (HOSPITAL_BASED_OUTPATIENT_CLINIC_OR_DEPARTMENT_OTHER): Payer: Self-pay | Admitting: Family Medicine

## 2023-09-03 ENCOUNTER — Ambulatory Visit (HOSPITAL_BASED_OUTPATIENT_CLINIC_OR_DEPARTMENT_OTHER): Payer: Medicare Other | Admitting: Family Medicine

## 2023-09-03 VITALS — BP 160/106 | HR 105 | Ht 66.0 in | Wt 170.6 lb

## 2023-09-03 DIAGNOSIS — Z0000 Encounter for general adult medical examination without abnormal findings: Secondary | ICD-10-CM | POA: Diagnosis not present

## 2023-09-03 DIAGNOSIS — E2839 Other primary ovarian failure: Secondary | ICD-10-CM

## 2023-09-03 DIAGNOSIS — F411 Generalized anxiety disorder: Secondary | ICD-10-CM | POA: Diagnosis not present

## 2023-09-03 MED ORDER — METOPROLOL TARTRATE 25 MG PO TABS: 25.0000 mg | ORAL_TABLET | Freq: Two times a day (BID) | ORAL | 1 refills | Status: DC

## 2023-09-03 MED ORDER — BUSPIRONE HCL 7.5 MG PO TABS: 7.5000 mg | ORAL_TABLET | Freq: Two times a day (BID) | ORAL | 1 refills | Status: DC

## 2023-09-03 MED ORDER — ATORVASTATIN CALCIUM 20 MG PO TABS: 20.0000 mg | ORAL_TABLET | Freq: Every day | ORAL | 1 refills | Status: DC

## 2023-09-03 MED ORDER — BENAZEPRIL HCL 20 MG PO TABS: 20.0000 mg | ORAL_TABLET | Freq: Every day | ORAL | 1 refills | Status: DC

## 2023-09-03 MED ORDER — ALPRAZOLAM 0.5 MG PO TABS: 0.5000 mg | ORAL_TABLET | Freq: Every evening | ORAL | 0 refills | Status: DC | PRN

## 2023-09-03 MED ORDER — AMLODIPINE BESYLATE 5 MG PO TABS: 5.0000 mg | ORAL_TABLET | Freq: Two times a day (BID) | ORAL | 0 refills | Status: DC

## 2023-09-03 NOTE — Progress Notes (Signed)
Complete Physical Exam  Patient: Stephanie [name]   DOB: Apr 19, 1946   77 y.o. Female  MRN: [id number]  Subjective:    Stephanie [name] is a 77 y.o. female who presents today for a complete physical exam. She reports consuming a general diet. She was walking daily with a friend but had to stop due to the colder weather. She generally feels well. She reports sleeping well. She does not have additional problems to discuss today.   11/12- the day after the election, patient reports she had a severe anxiety attack to the point where her son thought she was having a stroke  Her BP at the time was 184/114 and they called EMS. She reports she did not go to the ED, since her BP came back down to 140s/90s.   Anxiety around medical appts started after diagnosis of endometrial cancer   She was seeing a therapist in the past but had limited session.  She has been using buspar PRN for anxiety. She has utilized xanax in the past with success.  Home BP readings:  120/81 78 118/71 72 119/77 66 120/83 71 117/68 61   Depression screenings:    09/03/2023    2:04 PM 02/26/2023   11:13 AM 02/06/2023    1:09 PM  Depression screen PHQ 2/9  Decreased Interest 0 0 0  Down, Depressed, Hopeless 1 0 0  PHQ - 2 Score 1 0 0  Altered sleeping 0    Tired, decreased energy 0    Change in appetite 0    Feeling bad or failure about yourself  1    Trouble concentrating 0    Moving slowly or fidgety/restless 0    Suicidal thoughts 0    PHQ-9 Score 2    Difficult doing work/chores Somewhat difficult      Anxiety screenings:    09/03/2023    2:04 PM 02/06/2023    4:28 PM 07/09/2018    9:31 AM 01/02/2017   10:53 AM  GAD 7 : Generalized Anxiety Score  Nervous, Anxious, on Edge 1 0 -- 0  Control/stop worrying 1 0  0  Worry too much - different things 1 1  0  Trouble relaxing 0 1  0  Restless 0 0  0  Easily annoyed or irritable 1 0  0  Afraid - awful might happen 1 0  0  Total GAD 7 Score 5 2  0  Anxiety  Difficulty Somewhat difficult Not difficult at all      Patient Care Team: Alyson Reedy, FNP as PCP - General (Family Medicine) Loraine Grip, Harrietta Guardian, MD as Referring Physician (Ophthalmology) Antionette Char, MD as Consulting Physician (Obstetrics and Gynecology)   Outpatient Medications Prior to Visit  Medication Sig   brimonidine (ALPHAGAN) 0.2 % ophthalmic solution    dorzolamide-timolol (COSOPT) 22.3-6.8 MG/ML ophthalmic solution Place 1 drop into the left eye 2 (two) times daily.   ROCKLATAN 0.02-0.005 % SOLN Apply to eye.   [DISCONTINUED] amLODipine (NORVASC) 5 MG tablet Take 1 tablet (5 mg total) by mouth 2 (two) times daily.   [DISCONTINUED] atorvastatin (LIPITOR) 20 MG tablet Take 1 tablet (20 mg total) by mouth daily.   [DISCONTINUED] benazepril (LOTENSIN) 20 MG tablet Take 1 tablet (20 mg total) by mouth daily.   [DISCONTINUED] busPIRone (BUSPAR) 7.5 MG tablet Take 1 tablet (7.5 mg total) by mouth 2 (two) times daily.   [DISCONTINUED] metoprolol tartrate (LOPRESSOR) 25 MG tablet Take 1 tablet (25 mg total) by mouth  2 (two) times daily.   No facility-administered medications prior to visit.    Review of Systems  Constitutional:  Negative for malaise/fatigue and weight loss.  Eyes:        Patient is legally blind  Respiratory:  Negative for cough and shortness of breath.   Cardiovascular:  Negative for chest pain, palpitations and leg swelling.  Gastrointestinal:  Negative for abdominal pain, nausea and vomiting.  Musculoskeletal:  Negative for myalgias.  Neurological:  Negative for dizziness, weakness and headaches.  Psychiatric/Behavioral:  Negative for depression, memory loss, substance abuse and suicidal ideas. The patient is nervous/anxious. The patient does not have insomnia.        Objective:     BP (!) 160/106   Pulse (!) 105   Ht 5\' 6"  (1.676 m)   Wt 170 lb 9.6 oz (77.4 kg)   BMI 27.54 kg/m  BP Readings from Last 3 Encounters:  09/03/23 (!) 160/106   06/28/23 116/74  06/18/23 (!) 179/94     Physical Exam Vitals reviewed.  Constitutional:      Appearance: Normal appearance.  HENT:     Right Ear: Tympanic membrane, ear canal and external ear normal.     Left Ear: Tympanic membrane, ear canal and external ear normal.     Nose: Nose normal.     Mouth/Throat:     Mouth: Mucous membranes are moist.     Pharynx: Oropharynx is clear.  Cardiovascular:     Rate and Rhythm: Normal rate and regular rhythm.     Pulses: Normal pulses.     Heart sounds: Normal heart sounds.  Pulmonary:     Effort: Pulmonary effort is normal.     Breath sounds: Normal breath sounds.  Abdominal:     General: Abdomen is flat. Bowel sounds are normal.     Palpations: Abdomen is soft.  Musculoskeletal:        General: Normal range of motion.  Skin:    General: Skin is warm and dry.  Neurological:     Mental Status: She is alert.  Psychiatric:        Mood and Affect: Mood normal.        Behavior: Behavior normal.        Thought Content: Thought content normal.        Judgment: Judgment normal.         Assessment & Plan:    Routine Health Maintenance and Physical Exam  Health Maintenance  Topic Date Due   Mammogram  Never done   DTaP/Tdap/Td vaccine (1 - Tdap) Never done   Zoster (Shingles) Vaccine (1 of 2) Never done   DEXA scan (bone density measurement)  Never done   COVID-19 Vaccine (5 - 2023-24 season) 09/19/2023*   Hepatitis C Screening  09/02/2024*   Medicare Annual Wellness Visit  02/26/2024   Cologuard (Stool DNA test)  07/15/2025   Pneumonia Vaccine  Completed   Flu Shot  Completed   HPV Vaccine  Aged Out  *Topic was postponed. The date shown is not the original due date.   1. Wellness examination Discussed routine labs obtained prior to visit.   Review of PMH, FH, SH, medications and HM performed. Preventative care hand-out provided.  Recommend healthy diet.  Recommend approximately 150 minutes/week of moderate intensity  exercise. Recommend regular dental and vision exams. Always use seatbelt/lap and shoulder restraints. Recommend using smoke alarms and checking batteries at least twice a year. Recommend using sunscreen when outside. Discussed immunization recommendations  for shingles, RSV, and tetanus vaccines. Patient plans to get them in the pharmacy. She reports she may not get the Shingrix at this time.   Cologuard: UTD, completed 07/15/2025 MMG: wants to discuss with oncologist  BMD:  will order, patient wishes to complete "at a later time"  Last pap smear: aged out   2. Generalized anxiety disorder Patient reports uncontrolled anxiety prior to medical appointments. GAD7 completed with score of 5. She has been utilizing buspar PRN without immediate relief. Discussed hesitation with benzodiazepine with her age; however, there is an obvious correlation to her high blood pressures and her anxiety. PDMP reviewed, no red flags present. Her home blood pressures are well controlled at home- therefore, no adjustments made to her BP medications.   - ALPRAZolam (XANAX) 0.5 MG tablet; Take 1 tablet (0.5 mg total) by mouth at bedtime as needed for anxiety.  Dispense: 20 tablet; Refill: 0   Return in about 4 months (around 01/01/2024) for Mood f/u.     Alyson Reedy, FNP

## 2023-09-03 NOTE — Patient Instructions (Signed)

## 2023-09-05 ENCOUNTER — Encounter (HOSPITAL_BASED_OUTPATIENT_CLINIC_OR_DEPARTMENT_OTHER): Payer: Self-pay | Admitting: Family Medicine

## 2023-09-25 ENCOUNTER — Other Ambulatory Visit (HOSPITAL_BASED_OUTPATIENT_CLINIC_OR_DEPARTMENT_OTHER): Payer: Self-pay | Admitting: *Deleted

## 2023-09-25 DIAGNOSIS — Z1211 Encounter for screening for malignant neoplasm of colon: Secondary | ICD-10-CM

## 2023-09-30 DIAGNOSIS — Z1211 Encounter for screening for malignant neoplasm of colon: Secondary | ICD-10-CM | POA: Diagnosis not present

## 2023-10-10 LAB — COLOGUARD: COLOGUARD: NEGATIVE

## 2023-10-21 ENCOUNTER — Other Ambulatory Visit (HOSPITAL_BASED_OUTPATIENT_CLINIC_OR_DEPARTMENT_OTHER): Payer: Self-pay | Admitting: Family Medicine

## 2023-10-21 DIAGNOSIS — F411 Generalized anxiety disorder: Secondary | ICD-10-CM

## 2023-10-21 NOTE — Telephone Encounter (Signed)
PDMP reviewed, no red flags present. Refill sent to pharmacy on file.

## 2023-10-29 ENCOUNTER — Telehealth: Payer: Self-pay | Admitting: *Deleted

## 2023-10-29 NOTE — Telephone Encounter (Signed)
 Spoke with Stephanie [name] who called the office to schedule her annual follow up with Dr. Tamela Oddi. Pt was given an appt. For Wednesday, March 5 th. At 1100 with 1045 arrival time for check in. Pt agreed to date and time.

## 2023-11-30 ENCOUNTER — Other Ambulatory Visit (HOSPITAL_BASED_OUTPATIENT_CLINIC_OR_DEPARTMENT_OTHER): Payer: Self-pay | Admitting: Family Medicine

## 2023-11-30 DIAGNOSIS — F411 Generalized anxiety disorder: Secondary | ICD-10-CM

## 2023-12-18 ENCOUNTER — Inpatient Hospital Stay: Payer: Medicare HMO | Attending: Obstetrics & Gynecology | Admitting: Obstetrics & Gynecology

## 2023-12-18 VITALS — BP 150/90 | HR 100 | Temp 98.0°F | Resp 20 | Wt 171.6 lb

## 2023-12-18 DIAGNOSIS — Z8542 Personal history of malignant neoplasm of other parts of uterus: Secondary | ICD-10-CM | POA: Insufficient documentation

## 2023-12-18 DIAGNOSIS — C541 Malignant neoplasm of endometrium: Secondary | ICD-10-CM

## 2023-12-18 NOTE — Patient Instructions (Signed)
 Plan to follow up with Dr. Bertis Ruddy in September 2025 and Dr. Tamela Oddi in March 2026 or sooner if needed.   Please call the office at [phone] for any new symptoms, questions etc.   Symptoms to report to your health care team include vaginal bleeding, rectal bleeding, bloating, weight loss without effort, new and persistent pain, new and  persistent fatigue, new leg swelling, new masses (i.e., bumps in your neck or groin), new and persistent cough, new and persistent nausea and vomiting, change in bowel or bladder habits, and any other concerns.

## 2023-12-18 NOTE — Progress Notes (Unsigned)
 Follow Up Note: Gyn-Onc  Stephanie [name] 78 y.o. female  CC: She presents for a follow-up visit   HPI: The oncology history was reviewed.  Interval History: At the last visit there was a paraspinous mass that was discovered on imaging that was suspicious for a possible disease recurrence.  A needle core biopsy of the lesion was confirmatory.  She completed systemic therapy in August 2022.  She was recently seen in follow-up by Dr. Bertis Ruddy in 9/24 and was felt to be clinically without evidence of disease.  Review of Systems  Review of Systems  Constitutional:  Negative for malaise/fatigue and weight loss.  Respiratory:  Negative for shortness of breath and wheezing.   Cardiovascular:  Negative for chest pain and leg swelling.  Gastrointestinal:  Negative for abdominal pain, blood in stool, constipation, nausea and vomiting.  Genitourinary:  Negative for dysuria, frequency, hematuria and urgency.  Musculoskeletal:  Negative for joint pain and myalgias.  Neurological:  Negative for weakness.  Psychiatric/Behavioral:  Negative for depression. The patient does not have insomnia.    Current medications, allergy, social history, past surgical history, past medical history, family history were all reviewed.    Vitals:  Blood pressure (!) 175/94, pulse 100, temperature 98 F (36.7 C), temperature source Oral, resp. rate 20, weight 171 lb 9.6 oz (77.8 kg), SpO2 100%.  Physical Exam:  Physical Exam Exam conducted with a chaperone present.  Constitutional:      General: She is not in acute distress. Cardiovascular:     Rate and Rhythm: Normal rate and regular rhythm.  Pulmonary:     Effort: Pulmonary effort is normal.     Breath sounds: Normal breath sounds. No wheezing or rhonchi.  Abdominal:     Palpations: Abdomen is soft.     Tenderness: There is no abdominal tenderness. There is no right CVA tenderness or left CVA tenderness.     Hernia: No hernia is present.  Genitourinary:     General: Normal vulva.     Urethra: No urethral lesion.     Vagina: No lesions. No bleeding Musculoskeletal:     Cervical back: Neck supple.     Right lower leg: No edema.     Left lower leg: No edema.  Lymphadenopathy:     Upper Body:     Right upper body: No supraclavicular adenopathy.     Left upper body: No supraclavicular adenopathy.     Lower Body: No right inguinal adenopathy. No left inguinal adenopathy.  Skin:    Findings: No rash.  Neurological:     Mental Status: She is oriented to person, place, and time.   Assessment/Plan:  No problem-specific Assessment & Plan notes found for this encounter.   I personally spent 25 minutes face-to-face and non-face-to-face in the care of this patient, which includes all pre, intra, and post visit time on the date of service.    Antionette Char, MD

## 2023-12-18 NOTE — Assessment & Plan Note (Signed)
 Stephanie [name] is a 78 y.o. woman with a history of recurrent endometrial cancer Negative symptom review, normal exam.      > Continue every 6 month surveillance visits.  She will see Dr. Bertis Ruddy in 9/25 and return to this clinic in 1 year

## 2023-12-19 ENCOUNTER — Encounter: Payer: Self-pay | Admitting: Obstetrics & Gynecology

## 2023-12-22 ENCOUNTER — Emergency Department (HOSPITAL_BASED_OUTPATIENT_CLINIC_OR_DEPARTMENT_OTHER)

## 2023-12-22 ENCOUNTER — Other Ambulatory Visit: Payer: Self-pay

## 2023-12-22 ENCOUNTER — Emergency Department (HOSPITAL_BASED_OUTPATIENT_CLINIC_OR_DEPARTMENT_OTHER)
Admission: EM | Admit: 2023-12-22 | Discharge: 2023-12-22 | Disposition: A | Discharge status: Home / Self Care | Attending: Emergency Medicine | Admitting: Emergency Medicine

## 2023-12-22 DIAGNOSIS — W228XXA Striking against or struck by other objects, initial encounter: Secondary | ICD-10-CM | POA: Insufficient documentation

## 2023-12-22 DIAGNOSIS — Z23 Encounter for immunization: Secondary | ICD-10-CM | POA: Diagnosis not present

## 2023-12-22 DIAGNOSIS — S0191XA Laceration without foreign body of unspecified part of head, initial encounter: Secondary | ICD-10-CM

## 2023-12-22 DIAGNOSIS — S0181XA Laceration without foreign body of other part of head, initial encounter: Secondary | ICD-10-CM | POA: Insufficient documentation

## 2023-12-22 DIAGNOSIS — I672 Cerebral atherosclerosis: Secondary | ICD-10-CM | POA: Diagnosis not present

## 2023-12-22 DIAGNOSIS — Z79899 Other long term (current) drug therapy: Secondary | ICD-10-CM | POA: Insufficient documentation

## 2023-12-22 DIAGNOSIS — Z8542 Personal history of malignant neoplasm of other parts of uterus: Secondary | ICD-10-CM | POA: Insufficient documentation

## 2023-12-22 DIAGNOSIS — S0990XA Unspecified injury of head, initial encounter: Secondary | ICD-10-CM | POA: Diagnosis not present

## 2023-12-22 DIAGNOSIS — I10 Essential (primary) hypertension: Secondary | ICD-10-CM | POA: Insufficient documentation

## 2023-12-22 MED ORDER — TETANUS-DIPHTH-ACELL PERTUSSIS 5-2.5-18.5 LF-MCG/0.5 IM SUSY
0.5000 mL | PREFILLED_SYRINGE | Freq: Once | INTRAMUSCULAR | Status: AC
  Administered 2023-12-22: 0.5 mL via INTRAMUSCULAR
  Filled 2023-12-22: qty 0.5

## 2023-12-22 NOTE — ED Provider Notes (Signed)
 [place] [facility]PARTMENT AT [facility] Provider Note   CSN: [id number] Arrival date & time: 12/22/23  1401     History  Chief Complaint  Patient presents with   Head Injury    Stephanie [name] is a 78 y.o. female who presents with complaints of head injury.  Patient states she hit her head on the door and sustained a laceration to her forehead.  She did not lose consciousness.  She is not on blood thinners.  She denies any new blurry vision, dizziness, headache, difficulty walking, extremity weakness.  Is accompanied by son who reports she has been behaving at baseline since.   Head Injury  Past Medical History:  Diagnosis Date   Anemia    Anxiety    Blindness, legal    Cataract    Complication of anesthesia    Difficulty sleeping    Endometrial cancer (HCC) 2021   Family history of liver cancer    Family history of lung cancer    Fibroids    Glaucoma    History of transfusion    Hypertension    PNA (pneumonia)    PONV (postoperative nausea and vomiting)    Vaginal bleeding        Home Medications Prior to Admission medications   Medication Sig Start Date End Date Taking? Authorizing Provider  ALPRAZolam Prudy Feeler) 0.5 MG tablet TAKE 1 TABLET BY MOUTH AT BEDTIME AS NEEDED FOR ANXIETY 12/02/23   Caudle, Shelton Silvas, FNP  amLODipine (NORVASC) 5 MG tablet Take 1 tablet (5 mg total) by mouth 2 (two) times daily. 09/03/23   Alyson Reedy, FNP  atorvastatin (LIPITOR) 20 MG tablet Take 1 tablet (20 mg total) by mouth daily. 09/03/23   Alyson Reedy, FNP  benazepril (LOTENSIN) 20 MG tablet Take 1 tablet (20 mg total) by mouth daily. 09/03/23   Alyson Reedy, FNP  brimonidine [facility]) 0.2 % ophthalmic solution  09/29/19   [provider]  dorzolamide-timolol (COSOPT) 22.3-6.8 MG/ML ophthalmic solution Place 1 drop into the left eye 2 (two) times daily. 08/30/19   [provider]  metoprolol tartrate (LOPRESSOR) 25 MG tablet Take 1  tablet (25 mg total) by mouth 2 (two) times daily. 09/03/23   Alyson Reedy, FNP  ROCKLATAN 0.02-0.005 % SOLN Apply to eye. 02/04/23   [provider]      Allergies    Iron, Iron dextran, Penicillins, Amoxicillin, Ampicillin, Codeine, Diamox [acetazolamide], Penicillin g, and Other    Review of Systems   Review of Systems  Skin:  Positive for wound.    Physical Exam Updated Vital Signs BP (!) 164/100 (BP Location: Right Arm)   Pulse 94   Temp 98 F (36.7 C) (Oral)   Resp 16   SpO2 98%  Physical Exam Vitals and nursing note reviewed.  Constitutional:      General: She is not in acute distress.    Appearance: She is well-developed.  HENT:     Head:     Comments: Superficial laceration to patient's left temple, no significant hematoma, no significant tenderness appreciated Eyes:     Conjunctiva/sclera: Conjunctivae normal.  Cardiovascular:     Rate and Rhythm: Normal rate and regular rhythm.     Heart sounds: No murmur heard. Pulmonary:     Effort: Pulmonary effort is normal. No respiratory distress.     Breath sounds: Normal breath sounds.  Musculoskeletal:        General: No swelling.     Cervical back: Neck supple.  Skin:    General: Skin is warm and dry.     Capillary Refill: Capillary refill takes less than 2 seconds.  Neurological:     Mental Status: She is alert.     Comments: Patient is alert and oriented. There is no abnormal phonation. Symmetric smile without facial droop. Moves all extremities spontaneously. 5/5 strength in upper and lower extremities. . No sensation deficit. There is no nystagmus. EOMI, PERRL.  normal ambulation.    Psychiatric:        Mood and Affect: Mood normal.     ED Results / Procedures / Treatments   Labs (all labs ordered are listed, but only abnormal results are displayed) Labs Reviewed - No data to display  EKG None  Radiology CT Head Wo Contrast Result Date: 12/22/2023 CLINICAL DATA:  Head trauma, minor (Age  >= 65y) EXAM: CT HEAD WITHOUT CONTRAST TECHNIQUE: Contiguous axial images were obtained from the base of the skull through the vertex without intravenous contrast. RADIATION DOSE REDUCTION: This exam was performed according to the departmental dose-optimization program which includes automated exposure control, adjustment of the mA and/or kV according to patient size and/or use of iterative reconstruction technique. COMPARISON:  None Available. FINDINGS: Brain: No intracranial hemorrhage, mass effect, or midline shift. Normal brain volume for age. No hydrocephalus. The basilar cisterns are patent. No evidence of territorial infarct or acute ischemia. No extra-axial or intracranial fluid collection. Vascular: Atherosclerosis of skullbase vasculature without hyperdense vessel or abnormal calcification. Skull: No fracture or focal lesion. Sinuses/Orbits: Partial opacification of lower left mastoid air cells. No facial bone fracture. Bilateral cataract resection. Other: No large confluent scalp hematoma. IMPRESSION: 1. No acute intracranial abnormality. No skull fracture. 2. Partial opacification of lower left mastoid air cells. Electronically Signed   By: Narda Rutherford M.D.   On: 12/22/2023 15:52    Procedures .Laceration Repair  Date/Time: 12/22/2023 4:49 PM  Performed by: Halford Decamp, PA-C Authorized by: Halford Decamp, PA-C   Consent:    Consent obtained:  Verbal   Consent given by:  Patient   Risks, benefits, and alternatives were discussed: yes     Risks discussed:  Need for additional repair and poor cosmetic result   Alternatives discussed:  No treatment Universal protocol:    Procedure explained and questions answered to patient or proxy's satisfaction: yes     Patient identity confirmed:  Verbally with patient and arm band Anesthesia:    Anesthesia method:  None Laceration details:    Location:  Face   Face location:  Forehead   Length (cm):  1 Treatment:    Area cleansed  with:  Chlorhexidine Skin repair:    Repair method:  Tissue adhesive Post-procedure details:    Procedure completion:  Tolerated     Medications Ordered in ED Medications  Tdap (BOOSTRIX) injection 0.5 mL (0.5 mLs Intramuscular Given 12/22/23 1535)    ED Course/ Medical Decision Making/ A&P                                 Medical Decision Making  This patient presents to the ED with chief complaint(s) of head injury.  The complaint involves an extensive differential diagnosis and also carries with it a high risk of complications and morbidity.   pertinent past medical history as listed in HPI  The differential diagnosis includes  Intracranial hemorrhage, fracture, simple laceration, muscle involvement The initial plan is to  Will start with CT head Additional history obtained: Additional history obtained from family Records reviewed Care Everywhere/External Records  Initial Assessment:   Patient presents hypertensive 153/103 and tachycardic to 106 following head injury, sustaining a simple laceration to the left temple.  Of note patient has a well-documented history of whitecoat syndrome. She states every time she comes in her BP is suddenly elevated. There are no focal neurodeficits on exam.  She is currently asymptomatic.  Laceration appears to be superficial,.  Hemostasis is predominantly achieved.  No cervical midline tenderness or loss of range of motion.  She is not up-to-date on tetanus.  Independent ECG interpretation:  none  Independent labs interpretation:  The following labs were independently interpreted:  none  Independent visualization and interpretation of imaging: I independently visualized the following imaging with scope of interpretation limited to determining acute life threatening conditions related to emergency care: CT head without contrast, which revealed no acute abnormality  Treatment and Reassessment: Laceration glued close and Steri-Strips  applied  Consultations obtained:   none  Disposition:   Patient will be discharged home. The patient has been appropriately medically screened and/or stabilized in the ED. I have low suspicion for any other emergent medical condition which would require further screening, evaluation or treatment in the ED or require inpatient management. At time of discharge the patient is hemodynamically stable and in no acute distress. I have discussed work-up results and diagnosis with patient and answered all questions. Patient is agreeable with discharge plan. We discussed strict return precautions for returning to the emergency department and they verbalized understanding.     Social Determinants of Health:   none  This note was dictated with voice recognition software.  Despite best efforts at proofreading, errors may have occurred which can change the documentation meaning.          Final Clinical Impression(s) / ED Diagnoses Final diagnoses:  Superficial laceration of head    Rx / DC Orders ED Discharge Orders     None         Halford Decamp, PA-C 12/22/23 1823    Franne Forts, DO 12/23/23 0002

## 2023-12-22 NOTE — Discharge Instructions (Addendum)
 You were evaluated in the emergency room following a head injury.  Your imaging did not show any acute abnormality.  Your laceration was glued closed.  You may shower tomorrow morning.  If you experience any new or worsening symptoms including worsening dizziness, blurry vision, headache, difficulty walking or speaking please return to the emergency room.

## 2023-12-22 NOTE — ED Triage Notes (Signed)
 Struck head on edge of open door. 1cm lac to top left forehead. Bleeding controlled on arrival. Tetanus overdue.

## 2024-02-10 ENCOUNTER — Other Ambulatory Visit (HOSPITAL_BASED_OUTPATIENT_CLINIC_OR_DEPARTMENT_OTHER): Payer: Self-pay | Admitting: Family Medicine

## 2024-02-13 ENCOUNTER — Other Ambulatory Visit (HOSPITAL_BASED_OUTPATIENT_CLINIC_OR_DEPARTMENT_OTHER): Payer: Self-pay | Admitting: Family Medicine

## 2024-02-13 DIAGNOSIS — F411 Generalized anxiety disorder: Secondary | ICD-10-CM

## 2024-03-02 ENCOUNTER — Encounter (HOSPITAL_BASED_OUTPATIENT_CLINIC_OR_DEPARTMENT_OTHER): Payer: Self-pay | Admitting: Family Medicine

## 2024-03-02 ENCOUNTER — Ambulatory Visit (HOSPITAL_BASED_OUTPATIENT_CLINIC_OR_DEPARTMENT_OTHER): Payer: Medicare Other | Admitting: Family Medicine

## 2024-03-02 ENCOUNTER — Other Ambulatory Visit (HOSPITAL_BASED_OUTPATIENT_CLINIC_OR_DEPARTMENT_OTHER): Payer: Self-pay | Admitting: Family Medicine

## 2024-03-02 VITALS — BP 138/88 | HR 109 | Ht 66.0 in | Wt 169.4 lb

## 2024-03-02 DIAGNOSIS — Z0000 Encounter for general adult medical examination without abnormal findings: Secondary | ICD-10-CM

## 2024-03-02 DIAGNOSIS — C541 Malignant neoplasm of endometrium: Secondary | ICD-10-CM | POA: Diagnosis not present

## 2024-03-02 DIAGNOSIS — F411 Generalized anxiety disorder: Secondary | ICD-10-CM

## 2024-03-02 DIAGNOSIS — I10 Essential (primary) hypertension: Secondary | ICD-10-CM

## 2024-03-02 DIAGNOSIS — N182 Chronic kidney disease, stage 2 (mild): Secondary | ICD-10-CM

## 2024-03-02 DIAGNOSIS — E782 Mixed hyperlipidemia: Secondary | ICD-10-CM

## 2024-03-02 DIAGNOSIS — R7301 Impaired fasting glucose: Secondary | ICD-10-CM

## 2024-03-02 MED ORDER — ATORVASTATIN CALCIUM 20 MG PO TABS: 20.0000 mg | ORAL_TABLET | Freq: Every day | ORAL | 3 refills | Status: DC

## 2024-03-02 MED ORDER — BENAZEPRIL HCL 20 MG PO TABS: 20.0000 mg | ORAL_TABLET | Freq: Every day | ORAL | 3 refills | Status: DC

## 2024-03-02 MED ORDER — METOPROLOL TARTRATE 25 MG PO TABS: 25.0000 mg | ORAL_TABLET | Freq: Two times a day (BID) | ORAL | 3 refills | Status: DC

## 2024-03-02 MED ORDER — ALPRAZOLAM 0.5 MG PO TABS
0.5000 mg | ORAL_TABLET | Freq: Every evening | ORAL | 2 refills | Status: DC | PRN
Start: 2024-03-02 — End: 2024-08-18

## 2024-03-02 MED ORDER — AMLODIPINE BESYLATE 5 MG PO TABS: 5.0000 mg | ORAL_TABLET | Freq: Two times a day (BID) | ORAL | 3 refills | Status: DC

## 2024-03-02 NOTE — Progress Notes (Signed)
 Subjective:   Stephanie [name] 1946-08-22 03/02/2024  Chief Complaint  Patient presents with   Follow-up    6 months follow up     HPI: Stephanie [name] presents today for re-assessment and management of chronic medical conditions.   HYPERTENSION: Stephanie [name] presents for the medical management of hypertension.  Patient's current hypertension medication regimen is: Amlodipine  5mg , Benazapril 20mg , Metoprolol  25mg  BID Patient is  currently taking prescribed medications for HTN.  Patient is  regularly keeping a check on BP at home.  Adhering to low sodium diet: Yes Denies headache, dizziness, CP, SHOB, vision changes.    BP Log at Home:  112/69, 118/83, 114/72, 121/81, 104/71, 120/68, 110/74,  BP Readings from Last 3 Encounters:  03/02/24 138/88  12/22/23 (!) 164/100  12/18/23 (!) 150/90    ANXIETY: Stephanie [name] presents for the medical management of anxiety.  Current medication regimen: Alprazolam  0.5mg  at night PRN  Well controlled: Patient reports monthly medication lasts approx. 2-3 months as she uses sparingly. Reports anxiety and depression is well controlled. Denies concerns.  Denies SI/HI.     03/02/2024    2:09 PM 09/03/2023    2:04 PM 02/06/2023    4:28 PM 07/09/2018    9:31 AM  GAD 7 : Generalized Anxiety Score  Nervous, Anxious, on Edge 0 1 0 --  Control/stop worrying 3 1 0   Worry too much - different things 3 1 1    Trouble relaxing 0 0 1   Restless 0 0 0   Easily annoyed or irritable 0 1 0   Afraid - awful might happen 3 1 0   Total GAD 7 Score 9 5 2    Anxiety Difficulty Somewhat difficult Somewhat difficult Not difficult at all       03/02/2024    2:09 PM 09/03/2023    2:04 PM 02/26/2023   11:13 AM  PHQ9 SCORE ONLY  PHQ-9 Total Score 0 2 0    ENDOMETRIAL CANCER:  Reviewed patient's cancer history as taking over as PCP. Patient is s/p TAH, bilateral salpingo-oophorectomy, pelvic lymphadenectomy in October 2014 and chemotherapy from  2021-2022. Patient is managed by [facility] and had recent visit in Sept 2024. Most recent CT ABD Pelvis in August 2024 showed no signs of recurrence or metastatic concerns.   The following portions of the patient's history were reviewed and updated as appropriate: past medical history, past surgical history, family history, social history, allergies, medications, and problem list.   Patient Active Problem List   Diagnosis Date Noted   Mixed hyperlipidemia 02/06/2023   Chronic knee pain 02/06/2023   Thrombocytopenia (HCC) 08/09/2020   Poor venous access 06/28/2020   Immunosuppression (HCC) 02/24/2020   Leukopenia 02/23/2020   Essential (primary) hypertension 01/12/2020   Hardening of the aorta (main artery of the heart) (HCC) 10/30/2019   Family history of lung cancer    Family history of liver cancer    IFG (impaired fasting glucose) 10/07/2019   Palpitations 10/05/2019   History of malignant neoplasm of endometrium 06/18/2019   Legal blindness 06/18/2019   Endometrial cancer (HCC) 06/17/2013   Fibroid uterus 06/01/2013   Thickened endometrium 06/01/2013   Postmenopausal vaginal bleeding 06/01/2013   Generalized anxiety disorder 05/15/2013   Iron  deficiency 04/11/2013   Glaucoma 04/11/2013   Iron  deficiency anemia due to chronic blood loss 04/04/2012   Senile nuclear sclerosis 02/18/2012   Primary open-angle glaucoma 02/18/2012   Severe stage glaucoma 02/18/2012   Nuclear  sclerosis 02/18/2012   Past Medical History:  Diagnosis Date   Anemia    Anxiety    Blindness, legal    Cataract    Complication of anesthesia    Difficulty sleeping    Endometrial cancer (HCC) 2021   Family history of liver cancer    Family history of lung cancer    Fibroids    Genetic testing 11/24/2019   Negative genetic testing. No pathogenic variants identified on the [facility] CancerNext Panel. The report date is 11/20/2019.     The CancerNext+RNAinsight gene panel offered by [employer] includes  sequencing and rearrangement analysis for the following 36 genes: APC*, ATM*, AXIN2, BARD1, BMPR1A, BRCA1*, BRCA2*, BRIP1*, CDH1*, CDK4, CDKN2A, CHEK2*, DICER1, MLH1*, MSH2*, MSH3, MSH6*, MUTYH*, NBN, NF1   Glaucoma    Goals of care, counseling/discussion 10/29/2019   History of transfusion    Hypertension    Hypokalemia 11/10/2020   Other constipation 10/29/2019   PNA (pneumonia)    PONV (postoperative nausea and vomiting)    Preventive measure 06/28/2020   Severe uncontrolled hypertension 05/15/2013   Tachycardia 02/01/2015   Uncontrolled hypertension 02/01/2015   Vaginal bleeding    Past Surgical History:  Procedure Laterality Date   ABDOMINAL HYSTERECTOMY N/A 07/21/2013   Procedure: TOTAL HYSTERECTOMY ABDOMINAL ;  Surgeon: Verdia Glad, MD;  Location: [facility];  Service: Gynecology;  Laterality: N/A;   DILATION AND CURETTAGE OF UTERUS     LAPAROTOMY N/A 07/21/2013   Procedure: EXPLORATORY LAPAROTOMY/ PELVIC LYMPHADENECTOMY   ;  Surgeon: Verdia Glad, MD;  Location: [facility];  Service: Gynecology;  Laterality: N/A;   SALPINGOOPHORECTOMY Bilateral 07/21/2013   Procedure: BILATERAL SALPINGO OOPHORECTOMY;  Surgeon: Verdia Glad, MD;  Location: [facility];  Service: Gynecology;  Laterality: Bilateral;   Family History  Problem Relation Age of Onset   Liver cancer Mother 3   Hypertension Father    Vision loss Maternal Grandmother    Lung cancer Paternal Uncle    Outpatient Medications Prior to Visit  Medication Sig Dispense Refill   brimonidine  (ALPHAGAN ) 0.2 % ophthalmic solution      dorzolamide-timolol  (COSOPT) 22.3-6.8 MG/ML ophthalmic solution Place 1 drop into the left eye 2 (two) times daily.     ROCKLATAN 0.02-0.005 % SOLN Apply to eye.     ALPRAZolam  (XANAX ) 0.5 MG tablet TAKE 1 TABLET BY MOUTH AT BEDTIME AS NEEDED FOR ANXIETY 20 tablet 0   amLODipine  (NORVASC ) 5 MG tablet Take 1 tablet by mouth twice daily 180 tablet 0   atorvastatin  (LIPITOR) 20  MG tablet Take 1 tablet (20 mg total) by mouth daily. 90 tablet 1   benazepril  (LOTENSIN ) 20 MG tablet Take 1 tablet (20 mg total) by mouth daily. 90 tablet 1   metoprolol  tartrate (LOPRESSOR ) 25 MG tablet Take 1 tablet (25 mg total) by mouth 2 (two) times daily. 180 tablet 1   No facility-administered medications prior to visit.   Allergies  Allergen Reactions   Iron  Anaphylaxis    IV Iron    Iron  Dextran Anaphylaxis   Penicillins Hives and Swelling    PT, STATED ALLERGIES TO ALL " CILLINS" Tongue swelling   Amoxicillin Other (See Comments)    Unknown reaction   Ampicillin Other (See Comments)    Unknown reaction   Codeine Nausea And Vomiting   Diamox [Acetazolamide] Nausea And Vomiting, Diarrhea and Nausea Only    Other reaction(s): Cramps (ALLERGY/intolerance) Severe nausea and vomitting   Penicillin G Swelling and Hives   Tramadol   Other Nausea Only     ROS: A complete ROS was performed with pertinent positives/negatives noted in the HPI. The remainder of the ROS are negative.    Objective:   Today's Vitals   03/02/24 1405 03/02/24 1444  BP: (!) 159/105 138/88  Pulse: (!) 109   SpO2: 100%   Weight: 169 lb 6.4 oz (76.8 kg)   Height: 5\' 6"  (1.676 m)   PainSc: 0-No pain     Physical Exam          GENERAL: Well-appearing, in NAD. Well nourished.  SKIN: Pink, warm and dry. No rash, lesion, ulceration, or ecchymoses.  Head: Normocephalic. NECK: Trachea midline. Full ROM w/o pain or tenderness.  CARDIAC: S1, S2 present, regular rate and rhythm without murmur or gallops.  EXTREMITIES: Without clubbing, cyanosis, or edema.  NEUROLOGIC: No motor or sensory deficits. Steady, even gait. C2-C12 intact.  PSYCH/MENTAL STATUS: Alert, oriented x 3. Cooperative, appropriate mood and affect.   Health Maintenance Due  Topic Date Due   MAMMOGRAM  Never done   Zoster Vaccines- Shingrix (1 of 2) Never done   DEXA SCAN  Never done   COVID-19 Vaccine (5 - 2024-25 season)  06/16/2023   Medicare Annual Wellness (AWV)  02/26/2024     Assessment & Plan:  1. Generalized anxiety disorder Controlled per patient. Safe use of medication reviewed with patient and concerns of safety with age. She verbalized understanding. Xanax  refilled.  - ALPRAZolam  (XANAX ) 0.5 MG tablet; Take 1 tablet (0.5 mg total) by mouth at bedtime as needed for anxiety.  Dispense: 20 tablet; Refill: 2  2. Endometrial cancer (HCC) (Primary) Managed by [facility]. Patient seeing provider Q64months. Office visits reviewed.   3. Essential (primary) hypertension Controlled. Continue current regimen of medication currently without change. Will contineu to monitor BP with home cuff as well.   4. Healthcare maintenance PCP recommend DXA and Mammogram. Patient states she will discuss with her Oncology first prior to completion.    Meds ordered this encounter  Medications   ALPRAZolam  (XANAX ) 0.5 MG tablet    Sig: Take 1 tablet (0.5 mg total) by mouth at bedtime as needed for anxiety.    Dispense:  20 tablet    Refill:  2    Supervising Provider:   DE Peru, RAYMOND J [[id number]]   amLODipine  (NORVASC ) 5 MG tablet    Sig: Take 1 tablet (5 mg total) by mouth 2 (two) times daily.    Dispense:  180 tablet    Refill:  3    Supervising Provider:   DE [place], RAYMOND J [[id number]]   atorvastatin  (LIPITOR) 20 MG tablet    Sig: Take 1 tablet (20 mg total) by mouth daily.    Dispense:  90 tablet    Refill:  3    Supervising Provider:   DE [place], RAYMOND J [[id number]]   benazepril  (LOTENSIN ) 20 MG tablet    Sig: Take 1 tablet (20 mg total) by mouth daily.    Dispense:  90 tablet    Refill:  3    Supervising Provider:   DE [place], RAYMOND J [[id number]]   metoprolol  tartrate (LOPRESSOR ) 25 MG tablet    Sig: Take 1 tablet (25 mg total) by mouth 2 (two) times daily.    Dispense:  180 tablet    Refill:  3    Please consider 90 day supplies to promote better adherence    Supervising Provider:   DE [place], RAYMOND J  [[id number]]   Lab  Orders  No laboratory test(s) ordered today   No images are attached to the encounter or orders placed in the encounter.  Return in about 6 months (around 09/03/2024) for ANNUAL PHYSICAL (fasting labs prior to visit) .    Patient to reach out to office if new, worrisome, or unresolved symptoms arise or if no improvement in patient's condition. Patient verbalized understanding and is agreeable to treatment plan. All questions answered to patient's satisfaction.    Nonda Bays, [place]

## 2024-03-02 NOTE — Patient Instructions (Signed)
 Shingrix - Get both of your shingles vaccines

## 2024-03-03 ENCOUNTER — Encounter (HOSPITAL_BASED_OUTPATIENT_CLINIC_OR_DEPARTMENT_OTHER): Payer: Medicare Other

## 2024-03-10 ENCOUNTER — Ambulatory Visit (HOSPITAL_BASED_OUTPATIENT_CLINIC_OR_DEPARTMENT_OTHER): Admitting: *Deleted

## 2024-03-10 ENCOUNTER — Encounter (HOSPITAL_BASED_OUTPATIENT_CLINIC_OR_DEPARTMENT_OTHER): Payer: Self-pay

## 2024-03-10 DIAGNOSIS — Z0000 Encounter for general adult medical examination without abnormal findings: Secondary | ICD-10-CM | POA: Diagnosis not present

## 2024-03-10 NOTE — Progress Notes (Signed)
 Subjective:   Stephanie [name] is a 78 y.o. female who presents for Medicare Annual (Subsequent) preventive examination.  Visit Complete: Virtual I connected with  Stephanie [name] on 03/10/24 by a audio enabled telemedicine application and verified that I am speaking with the correct person using two identifiers.  Patient Location: Home  Provider Location: Home Office  I discussed the limitations of evaluation and management by telemedicine. The patient expressed understanding and agreed to proceed.  Vital Signs: Because this visit was a virtual/telehealth visit, some criteria may be missing or patient reported. Any vitals not documented were not able to be obtained and vitals that have been documented are patient reported.    Cardiac Risk Factors include: advanced age (>64men, >22 women);hypertension;family history of premature cardiovascular disease     Objective:     There were no vitals filed for this visit. There is no height or weight on file to calculate BMI.     03/10/2024    2:37 PM 12/22/2023    2:14 PM 02/26/2023   11:16 AM 02/22/2023   11:06 AM 06/08/2021   12:25 PM 11/09/2020    2:13 PM 09/20/2020    1:13 PM  Advanced Directives  Does Patient Have a Medical Advance Directive? No No No No No No No  Would patient like information on creating a medical advance directive?   Yes (MAU/Ambulatory/Procedural Areas - Information given) Yes (MAU/Ambulatory/Procedural Areas - Information given)  No - Patient declined No - Patient declined    Current Medications (verified) Outpatient Encounter Medications as of 03/10/2024  Medication Sig   ALPRAZolam  (XANAX ) 0.5 MG tablet Take 1 tablet (0.5 mg total) by mouth at bedtime as needed for anxiety.   amLODipine  (NORVASC ) 5 MG tablet Take 1 tablet (5 mg total) by mouth 2 (two) times daily.   atorvastatin  (LIPITOR) 20 MG tablet Take 1 tablet (20 mg total) by mouth daily.   benazepril  (LOTENSIN ) 20 MG tablet Take 1 tablet (20 mg  total) by mouth daily.   brimonidine  (ALPHAGAN ) 0.2 % ophthalmic solution    dorzolamide-timolol  (COSOPT) 22.3-6.8 MG/ML ophthalmic solution Place 1 drop into the left eye 2 (two) times daily.   metoprolol  tartrate (LOPRESSOR ) 25 MG tablet Take 1 tablet (25 mg total) by mouth 2 (two) times daily.   ROCKLATAN 0.02-0.005 % SOLN Apply to eye.   No facility-administered encounter medications on file as of 03/10/2024.    Allergies (verified) Iron , Iron  dextran, Penicillins, Amoxicillin, Ampicillin, Codeine, Diamox [acetazolamide], Penicillin g, Tramadol , and Other   History: Past Medical History:  Diagnosis Date   Anemia    Anxiety    Blindness, legal    Cataract    Complication of anesthesia    Difficulty sleeping    Endometrial cancer (HCC) 2021   Family history of liver cancer    Family history of lung cancer    Fibroids    Genetic testing 11/24/2019   Negative genetic testing. No pathogenic variants identified on the [facility] CancerNext Panel. The report date is 11/20/2019.     The CancerNext+RNAinsight gene panel offered by [facility] includes sequencing and rearrangement analysis for the following 36 genes: APC*, ATM*, AXIN2, BARD1, BMPR1A, BRCA1*, BRCA2*, BRIP1*, CDH1*, CDK4, CDKN2A, CHEK2*, DICER1, MLH1*, MSH2*, MSH3, MSH6*, MUTYH*, NBN, NF1   Glaucoma    Goals of care, counseling/discussion 10/29/2019   History of transfusion    Hypertension    Hypokalemia 11/10/2020   Other constipation 10/29/2019   PNA (pneumonia)    PONV (postoperative  nausea and vomiting)    Preventive measure 06/28/2020   Severe uncontrolled hypertension 05/15/2013   Tachycardia 02/01/2015   Uncontrolled hypertension 02/01/2015   Vaginal bleeding    Past Surgical History:  Procedure Laterality Date   ABDOMINAL HYSTERECTOMY N/A 07/21/2013   Procedure: TOTAL HYSTERECTOMY ABDOMINAL ;  Surgeon: Stephanie Glad, MD;  Location: [facility];  Service: Gynecology;  Laterality: N/A;   DILATION AND  CURETTAGE OF UTERUS     LAPAROTOMY N/A 07/21/2013   Procedure: EXPLORATORY LAPAROTOMY/ PELVIC LYMPHADENECTOMY   ;  Surgeon: Stephanie Glad, MD;  Location: [facility];  Service: Gynecology;  Laterality: N/A;   SALPINGOOPHORECTOMY Bilateral 07/21/2013   Procedure: BILATERAL SALPINGO OOPHORECTOMY;  Surgeon: Stephanie Glad, MD;  Location: [facility];  Service: Gynecology;  Laterality: Bilateral;   Family History  Problem Relation Age of Onset   Liver cancer Mother 68   Hypertension Father    Vision loss Maternal Grandmother    Lung cancer Paternal Uncle    Social History   Socioeconomic History   Marital status: Divorced    Spouse name: Not on file   Number of children: 1   Years of education: Not on file   Highest education level: Master's degree (e.g., MA, MS, MEng, MEd, MSW, MBA)  Occupational History   Not on file  Tobacco Use   Smoking status: Never    Passive exposure: Never   Smokeless tobacco: Never  Substance and Sexual Activity   Alcohol use: No   Drug use: No   Sexual activity: Not Currently    Birth control/protection: None  Other Topics Concern   Not on file  Social History Narrative   02/26/23-patient has completed her cancer treatments and is no in remission. She only has to go to the cancer center once yearly   Social Drivers of Health   Financial Resource Strain: Low Risk  (03/10/2024)   Overall Financial Resource Strain (CARDIA)    Difficulty of Paying Living Expenses: Not hard at all  Food Insecurity: No Food Insecurity (03/10/2024)   Hunger Vital Sign    Worried About Running Out of Food in the Last Year: Never true    Ran Out of Food in the Last Year: Never true  Transportation Needs: No Transportation Needs (03/10/2024)   PRAPARE - Administrator, Civil Service (Medical): No    Lack of Transportation (Non-Medical): No  Physical Activity: Insufficiently Active (03/10/2024)   Exercise Vital Sign    Days of Exercise per Week: 4 days     Minutes of Exercise per Session: 30 min  Stress: No Stress Concern Present (03/10/2024)   [employer] of Occupational [employer] - Occupational Stress Questionnaire    Feeling of Stress : Not at all  Social Connections: Socially Isolated (03/10/2024)   Social Connection and Isolation Panel [NHANES]    Frequency of Communication with Friends and Family: Twice a week    Frequency of Social Gatherings with Friends and Family: Never    Attends Religious Services: 1 to 4 times per year    Active Member of [employer] or Organizations: No    Attends Engineer, structural: Never    Marital Status: Divorced    Tobacco Counseling Counseling given: Not Answered   Clinical Intake:  Pre-visit preparation completed: Yes  Pain : No/denies pain     Diabetes: No  How often do you need to have someone help you when you read instructions, pamphlets, or other written materials from your doctor  or pharmacy?: 1 - Never  Interpreter Needed?: No  Information entered by :: Kieth Pelt LPN   Activities of Daily Living    03/10/2024    2:37 PM  In your present state of health, do you have any difficulty performing the following activities:  Hearing? 0  Vision? 1  Difficulty concentrating or making decisions? 0  Walking or climbing stairs? 0  Dressing or bathing? 0  Doing errands, shopping? 0  Preparing Food and eating ? N  Using the Toilet? N  In the past six months, have you accidently leaked urine? N  Do you have problems with loss of bowel control? N  Managing your Medications? N  Managing your Finances? N  Housekeeping or managing your Housekeeping? N    Patient Care Team: Nonda Bays, FNP as PCP - General (Family Medicine) Illona Malone, Karlyn Overman, MD as Referring Physician (Ophthalmology) Abdul Hodgkin, MD as Consulting Physician (Obstetrics and Gynecology)  Indicate any recent Medical Services you may have received from other than [facility] providers in the past year  (date may be approximate).     Assessment:    This is a routine wellness examination for [place].  Hearing/Vision screen Hearing Screening - Comments:: No trouble hearing Vision Screening - Comments:: [facility] Legally Blind Up to date   Goals Addressed             This Visit's Progress    Patient Stated       Dance better        Depression Screen    03/10/2024    2:42 PM 03/02/2024    2:09 PM 09/03/2023    2:04 PM 02/26/2023   11:13 AM 02/06/2023    1:09 PM 07/09/2018    9:31 AM 01/02/2017   10:53 AM  PHQ 2/9 Scores  PHQ - 2 Score 0 0 1 0 0  0  PHQ- 9 Score  0 2      Exception Documentation      Medical reason     Fall Risk    03/10/2024    2:33 PM 03/02/2024    2:08 PM 02/26/2023   11:17 AM 02/25/2023    3:29 PM 02/06/2023    1:08 PM  Fall Risk   Falls in the past year? 0 0 0 0 0  Number falls in past yr: 0 0 0  0  Injury with Fall? 0 0 0  0  Risk for fall due to :  No Fall Risks No Fall Risks    Follow up Falls evaluation completed;Education provided;Falls prevention discussed Falls evaluation completed Falls prevention discussed      MEDICARE RISK AT HOME: Medicare Risk at Home Any stairs in or around the home?: No If so, are there any without handrails?: No Home free of loose throw rugs in walkways, pet beds, electrical cords, etc?: Yes Adequate lighting in your home to reduce risk of falls?: Yes Life alert?: No Use of a cane, walker or w/c?: No Grab bars in the bathroom?: No Shower chair or bench in shower?: No Elevated toilet seat or a handicapped toilet?: No  TIMED UP AND GO:  Was the test performed?  No    Cognitive Function:        03/10/2024    2:38 PM 02/26/2023   11:20 AM  6CIT Screen  What Year? 0 points 0 points  What month? 0 points 0 points  What time? 0 points 0 points  Count back from 20 0  points 0 points  Months in reverse 0 points 0 points  Repeat phrase 0 points 0 points  Total Score 0 points 0 points     Immunizations Immunization History  Administered Date(s) Administered   Fluad Quad(high Dose 65+) 07/29/2020, 07/09/2023   Influenza, High Dose Seasonal PF 07/27/2016, 07/03/2019, 07/11/2021, 07/11/2022   Influenza, Seasonal, Injecte, Preservative Fre 08/03/2013   Influenza,inj,Quad PF,6+ Mos 07/07/2014, 07/28/2015, 07/09/2018   Influenza-Unspecified 07/10/2017, 07/19/2018   Moderna Covid-19 Vaccine Bivalent Booster 18yrs & up 07/27/2021   Moderna Sars-Covid-2 Vaccination 07/27/2021   PFIZER(Purple Top)SARS-COV-2 Vaccination 11/27/2019, 12/19/2019, 06/28/2020   Pneumococcal Conjugate-13 02/01/2015   Pneumococcal Polysaccharide-23 02/02/2014   Tdap 12/22/2023    TDAP status: Up to date  Flu Vaccine status: Up to date  Pneumococcal vaccine status: Up to date  Covid-19 vaccine status: Completed vaccines  Qualifies for Shingles Vaccine? Yes   Zostavax completed No   Shingrix Completed?: No.    Education has been provided regarding the importance of this vaccine. Patient has been advised to call insurance company to determine out of pocket expense if they have not yet received this vaccine. Advised may also receive vaccine at local pharmacy or Health Dept. Verbalized acceptance and understanding.  Screening Tests Health Maintenance  Topic Date Due   MAMMOGRAM  Never done   Zoster Vaccines- Shingrix (1 of 2) Never done   DEXA SCAN  Never done   COVID-19 Vaccine (5 - 2024-25 season) 06/16/2023   Hepatitis C Screening  09/02/2024 (Originally 06/11/1964)   INFLUENZA VACCINE  05/15/2024   Medicare Annual Wellness (AWV)  03/10/2025   Fecal DNA (Cologuard)  09/29/2026   DTaP/Tdap/Td (2 - Td or Tdap) 12/21/2033   Pneumonia Vaccine 16+ Years old  Completed   HPV VACCINES  Aged Out   Meningococcal B Vaccine  Aged Out    Health Maintenance  Health Maintenance Due  Topic Date Due   MAMMOGRAM  Never done   Zoster Vaccines- Shingrix (1 of 2) Never done   DEXA SCAN  Never done    COVID-19 Vaccine (5 - 2024-25 season) 06/16/2023    Colorectal cancer screening: Type of screening: Cologuard. Completed 2024. Repeat every 3 years  Mammogram  Education provided  Bone Density   scheduled  Lung Cancer Screening: (Low Dose CT Chest recommended if Age 63-80 years, 20 pack-year currently smoking OR have quit w/in 15years.) does not qualify.   Lung Cancer Screening Referral:   Additional Screening:  Hepatitis C Screening  never done  Vision Screening: Recommended annual ophthalmology exams for early detection of glaucoma and other disorders of the eye. Is the patient up to date with their annual eye exam?  Yes  Who is the provider or what is the name of the office in which the patient attends annual eye exams? [facility] If pt is not established with a provider, would they like to be referred to a provider to establish care? No .   Dental Screening: Recommended annual dental exams for proper oral hygiene    Community Resource Referral / Chronic Care Management: CRR required this visit?  No   CCM required this visit?  No     Plan:     I have personally reviewed and noted the following in the patient's chart:   Medical and social history Use of alcohol, tobacco or illicit drugs  Current medications and supplements including opioid prescriptions. Patient is not currently taking opioid prescriptions. Functional ability and status Nutritional status Physical activity Advanced directives  List of other physicians Hospitalizations, surgeries, and ER visits in previous 12 months Vitals Screenings to include cognitive, depression, and falls Referrals and appointments  In addition, I have reviewed and discussed with patient certain preventive protocols, quality metrics, and best practice recommendations. A written personalized care plan for preventive services as well as general preventive health recommendations were provided to patient.     Kieth Pelt,  LPN   [id number]   After Visit Summary: (MyChart) Due to this being a telephonic visit, the after visit summary with patients personalized plan was offered to patient via MyChart   Nurse Notes:

## 2024-03-10 NOTE — Patient Instructions (Signed)
 Stephanie [name] , Thank you for taking time to come for your Medicare Wellness Visit. I appreciate your ongoing commitment to your health goals. Please review the following plan we discussed and let me know if I can assist you in the future.   Screening recommendations/referrals: Colonoscopy: up to date Mammogram: Education provided Bone Density: Scheduled Recommended yearly ophthalmology/optometry visit for glaucoma screening and checkup Recommended yearly dental visit for hygiene and checkup  Vaccinations: Influenza vaccine: up to date Pneumococcal vaccine: up to date Tdap vaccine: up to date Shingles vaccine: up to date       Preventive Care 65 Years and Older, Female Preventive care refers to lifestyle choices and visits with your health care provider that can promote health and wellness. What does preventive care include? A yearly physical exam. This is also called an annual well check. Dental exams once or twice a year. Routine eye exams. Ask your health care provider how often you should have your eyes checked. Personal lifestyle choices, including: Daily care of your teeth and gums. Regular physical activity. Eating a healthy diet. Avoiding tobacco and drug use. Limiting alcohol use. Practicing safe sex. Taking low-dose aspirin every day. Taking vitamin and mineral supplements as recommended by your health care provider. What happens during an annual well check? The services and screenings done by your health care provider during your annual well check will depend on your age, overall health, lifestyle risk factors, and family history of disease. Counseling  Your health care provider may ask you questions about your: Alcohol use. Tobacco use. Drug use. Emotional well-being. Home and relationship well-being. Sexual activity. Eating habits. History of falls. Memory and ability to understand (cognition). Work and work Astronomer. Reproductive health. Screening   You may have the following tests or measurements: Height, weight, and BMI. Blood pressure. Lipid and cholesterol levels. These may be checked every 5 years, or more frequently if you are over 31 years old. Skin check. Lung cancer screening. You may have this screening every year starting at age 84 if you have a 30-pack-year history of smoking and currently smoke or have quit within the past 15 years. Fecal occult blood test (FOBT) of the stool. You may have this test every year starting at age 62. Flexible sigmoidoscopy or colonoscopy. You may have a sigmoidoscopy every 5 years or a colonoscopy every 10 years starting at age 39. Hepatitis C blood test. Hepatitis B blood test. Sexually transmitted disease (STD) testing. Diabetes screening. This is done by checking your blood sugar (glucose) after you have not eaten for a while (fasting). You may have this done every 1-3 years. Bone density scan. This is done to screen for osteoporosis. You may have this done starting at age 78. Mammogram. This may be done every 1-2 years. Talk to your health care provider about how often you should have regular mammograms. Talk with your health care provider about your test results, treatment options, and if necessary, the need for more tests. Vaccines  Your health care provider may recommend certain vaccines, such as: Influenza vaccine. This is recommended every year. Tetanus, diphtheria, and acellular pertussis (Tdap, Td) vaccine. You may need a Td booster every 10 years. Zoster vaccine. You may need this after age 29. Pneumococcal 13-valent conjugate (PCV13) vaccine. One dose is recommended after age 62. Pneumococcal polysaccharide (PPSV23) vaccine. One dose is recommended after age 64. Talk to your health care provider about which screenings and vaccines you need and how often you need them. This information is  not intended to replace advice given to you by your health care provider. Make sure you discuss  any questions you have with your health care provider. Document Released: 10/28/2015 Document Revised: 06/20/2016 Document Reviewed: 08/02/2015 Elsevier Interactive Patient Education  2017 [employer].  Fall Prevention in the Home Falls can cause injuries. They can happen to people of all ages. There are many things you can do to make your home safe and to help prevent falls. What can I do on the outside of my home? Regularly fix the edges of walkways and driveways and fix any cracks. Remove anything that might make you trip as you walk through a door, such as a raised step or threshold. Trim any bushes or trees on the path to your home. Use bright outdoor lighting. Clear any walking paths of anything that might make someone trip, such as rocks or tools. Regularly check to see if handrails are loose or broken. Make sure that both sides of any steps have handrails. Any raised decks and porches should have guardrails on the edges. Have any leaves, snow, or ice cleared regularly. Use sand or salt on walking paths during winter. Clean up any spills in your garage right away. This includes oil or grease spills. What can I do in the bathroom? Use night lights. Install grab bars by the toilet and in the tub and shower. Do not use towel bars as grab bars. Use non-skid mats or decals in the tub or shower. If you need to sit down in the shower, use a plastic, non-slip stool. Keep the floor dry. Clean up any water  that spills on the floor as soon as it happens. Remove soap buildup in the tub or shower regularly. Attach bath mats securely with double-sided non-slip rug tape. Do not have throw rugs and other things on the floor that can make you trip. What can I do in the bedroom? Use night lights. Make sure that you have a light by your bed that is easy to reach. Do not use any sheets or blankets that are too big for your bed. They should not hang down onto the floor. Have a firm chair that has  side arms. You can use this for support while you get dressed. Do not have throw rugs and other things on the floor that can make you trip. What can I do in the kitchen? Clean up any spills right away. Avoid walking on wet floors. Keep items that you use a lot in easy-to-reach places. If you need to reach something above you, use a strong step stool that has a grab bar. Keep electrical cords out of the way. Do not use floor polish or wax that makes floors slippery. If you must use wax, use non-skid floor wax. Do not have throw rugs and other things on the floor that can make you trip. What can I do with my stairs? Do not leave any items on the stairs. Make sure that there are handrails on both sides of the stairs and use them. Fix handrails that are broken or loose. Make sure that handrails are as long as the stairways. Check any carpeting to make sure that it is firmly attached to the stairs. Fix any carpet that is loose or worn. Avoid having throw rugs at the top or bottom of the stairs. If you do have throw rugs, attach them to the floor with carpet tape. Make sure that you have a light switch at the top of the  stairs and the bottom of the stairs. If you do not have them, ask someone to add them for you. What else can I do to help prevent falls? Wear shoes that: Do not have high heels. Have rubber bottoms. Are comfortable and fit you well. Are closed at the toe. Do not wear sandals. If you use a stepladder: Make sure that it is fully opened. Do not climb a closed stepladder. Make sure that both sides of the stepladder are locked into place. Ask someone to hold it for you, if possible. Clearly mark and make sure that you can see: Any grab bars or handrails. First and last steps. Where the edge of each step is. Use tools that help you move around (mobility aids) if they are needed. These include: Canes. Walkers. Scooters. Crutches. Turn on the lights when you go into a dark area.  Replace any light bulbs as soon as they burn out. Set up your furniture so you have a clear path. Avoid moving your furniture around. If any of your floors are uneven, fix them. If there are any pets around you, be aware of where they are. Review your medicines with your doctor. Some medicines can make you feel dizzy. This can increase your chance of falling. Ask your doctor what other things that you can do to help prevent falls. This information is not intended to replace advice given to you by your health care provider. Make sure you discuss any questions you have with your health care provider. Document Released: 07/28/2009 Document Revised: 03/08/2016 Document Reviewed: 11/05/2014 Elsevier Interactive Patient Education  2017 [employer].

## 2024-04-13 ENCOUNTER — Telehealth: Payer: Self-pay

## 2024-04-13 NOTE — Telephone Encounter (Signed)
 Called son and given radiology scheduling # to call to scheduled CT on 8/25. Son will call to schedule.

## 2024-04-30 ENCOUNTER — Other Ambulatory Visit (HOSPITAL_BASED_OUTPATIENT_CLINIC_OR_DEPARTMENT_OTHER)

## 2024-05-05 ENCOUNTER — Telehealth: Payer: Self-pay

## 2024-05-05 NOTE — Telephone Encounter (Signed)
 Placed call pt pt's sojn, Stephanie [name] as pt is not yet scheduled for her CT. He states he will call to schedule and that he already has the number to call.

## 2024-05-06 ENCOUNTER — Other Ambulatory Visit: Payer: Medicare Other

## 2024-05-18 ENCOUNTER — Telehealth: Payer: Self-pay

## 2024-05-18 NOTE — Telephone Encounter (Signed)
 Spoke with son, Marsa, regarding need to get patient's CT scan scheduled. Alex requested that a myChart message be sent with detailed instructions and that he would work to get scan scheduled for patient when he returned home this evening. Instructions sent via myChart message. All questions answered during call.

## 2024-06-08 ENCOUNTER — Inpatient Hospital Stay: Payer: Medicare Other | Attending: Internal Medicine

## 2024-06-08 ENCOUNTER — Ambulatory Visit (HOSPITAL_COMMUNITY)
Admission: RE | Admit: 2024-06-08 | Discharge: 2024-06-08 | Disposition: A | Discharge status: Home / Self Care | Source: Ambulatory Visit | Attending: Hematology and Oncology

## 2024-06-08 DIAGNOSIS — C541 Malignant neoplasm of endometrium: Secondary | ICD-10-CM | POA: Insufficient documentation

## 2024-06-08 DIAGNOSIS — Z8542 Personal history of malignant neoplasm of other parts of uterus: Secondary | ICD-10-CM | POA: Insufficient documentation

## 2024-06-08 DIAGNOSIS — K7689 Other specified diseases of liver: Secondary | ICD-10-CM | POA: Diagnosis not present

## 2024-06-08 LAB — CBC WITH DIFFERENTIAL (CANCER CENTER ONLY)
Abs Immature Granulocytes: 0.02 K/uL (ref 0.00–0.07)
Basophils Absolute: 0.1 K/uL (ref 0.0–0.1)
Basophils Relative: 1 %
Eosinophils Absolute: 0.1 K/uL (ref 0.0–0.5)
Eosinophils Relative: 2 %
HCT: 41.1 % (ref 36.0–46.0)
Hemoglobin: 14.7 g/dL (ref 12.0–15.0)
Immature Granulocytes: 0 %
Lymphocytes Relative: 30 %
Lymphs Abs: 2.1 K/uL (ref 0.7–4.0)
MCH: 31.5 pg (ref 26.0–34.0)
MCHC: 35.8 g/dL (ref 30.0–36.0)
MCV: 88 fL (ref 80.0–100.0)
Monocytes Absolute: 0.5 K/uL (ref 0.1–1.0)
Monocytes Relative: 7 %
Neutro Abs: 4.2 K/uL (ref 1.7–7.7)
Neutrophils Relative %: 60 %
Platelet Count: 155 K/uL (ref 150–400)
RBC: 4.67 MIL/uL (ref 3.87–5.11)
RDW: 13.6 % (ref 11.5–15.5)
WBC Count: 7 K/uL (ref 4.0–10.5)
nRBC: 0 % (ref 0.0–0.2)

## 2024-06-08 LAB — CMP (CANCER CENTER ONLY)
ALT: 10 U/L (ref 0–44)
AST: 14 U/L — ABNORMAL LOW (ref 15–41)
Albumin: 4.6 g/dL (ref 3.5–5.0)
Alkaline Phosphatase: 92 U/L (ref 38–126)
Anion gap: 8 (ref 5–15)
BUN: 19 mg/dL (ref 8–23)
CO2: 25 mmol/L (ref 22–32)
Calcium: 9.2 mg/dL (ref 8.9–10.3)
Chloride: 109 mmol/L (ref 98–111)
Creatinine: 0.89 mg/dL (ref 0.44–1.00)
GFR, Estimated: >60 mL/min (ref >60)
Glucose, Bld: 95 mg/dL (ref 70–99)
Potassium: 3.4 mmol/L — ABNORMAL LOW (ref 3.5–5.1)
Sodium: 142 mmol/L (ref 135–145)
Total Bilirubin: 0.6 mg/dL (ref 0.0–1.2)
Total Protein: 7.7 g/dL (ref 6.5–8.1)

## 2024-06-08 MED ORDER — IOHEXOL 300 MG/ML  SOLN
100.0000 mL | Freq: Once | INTRAMUSCULAR | Status: AC | PRN
Start: 2024-06-08 — End: 2024-06-08
  Administered 2024-06-08: 100 mL via INTRAVENOUS

## 2024-06-18 ENCOUNTER — Inpatient Hospital Stay: Payer: Medicare Other | Attending: Hematology and Oncology | Admitting: Hematology and Oncology

## 2024-06-18 ENCOUNTER — Encounter: Payer: Self-pay | Admitting: Hematology and Oncology

## 2024-06-18 VITALS — BP 157/100 | HR 100 | Resp 18 | Ht 66.0 in | Wt 163.8 lb

## 2024-06-18 DIAGNOSIS — C541 Malignant neoplasm of endometrium: Secondary | ICD-10-CM

## 2024-06-18 DIAGNOSIS — Z9221 Personal history of antineoplastic chemotherapy: Secondary | ICD-10-CM | POA: Diagnosis not present

## 2024-06-18 DIAGNOSIS — Z08 Encounter for follow-up examination after completed treatment for malignant neoplasm: Secondary | ICD-10-CM | POA: Insufficient documentation

## 2024-06-18 DIAGNOSIS — I10 Essential (primary) hypertension: Secondary | ICD-10-CM | POA: Diagnosis not present

## 2024-06-18 DIAGNOSIS — Z8542 Personal history of malignant neoplasm of other parts of uterus: Secondary | ICD-10-CM | POA: Diagnosis not present

## 2024-06-18 NOTE — Assessment & Plan Note (Addendum)
Her blood pressure is typically normal at home but elevated today likely due to anxiety Observe closely for now

## 2024-06-18 NOTE — Progress Notes (Signed)
 [name] OFFICE PROGRESS NOTE  Patient Care Team: Caudle, Stephanie Bitters, FNP as PCP - General (Family Medicine) Stephanie [name], Stephanie Pac, MD as Referring Physician (Ophthalmology) Stephanie Planas, MD as Consulting Physician (Obstetrics and Gynecology)  Assessment & Plan Endometrial cancer Stephanie [name]) The patient was originally diagnosed with endometrial cancer in 2014, status post surgery Final pathology: Endometrioid adenocarcinoma, MMR abnormal  She had disease relapse in 2020 with presence of left paraspinal mass, biopsy showed poorly differentiated carcinoma, ER positive, consistent with GYN primary, MMR abnormal She received single agent pembrolizumab  between January 2021 to August 2022 with complete response  She is completely asymptomatic Repeat imaging study from August 2025 showed no evidence of disease I will continue to see her once a year The patient is educated to watch out for signs and symptoms of disease recurrence Essential (primary) hypertension Her blood pressure is typically normal at home but elevated today likely due to anxiety Observe closely for now  Orders Placed This Encounter  Procedures   CT ABDOMEN PELVIS W CONTRAST    Standing Status:   Future    Expected Date:   06/07/2025    Expiration Date:   09/05/2025    Scheduling Instructions:     No oral contrast    If indicated for the ordered procedure, I authorize the administration of contrast media per Radiology protocol:   Yes    Does the patient have a contrast media/X-ray dye allergy?:   No    Preferred imaging location?:   [facility]    If indicated for the ordered procedure, I authorize the administration of oral contrast media per Radiology protocol:   No    Reason for no oral contrast::   No need oral contrast   CBC with Differential ([name] Only)    Standing Status:   Future    Expiration Date:   06/18/2025   CMP ([name] only)    Standing Status:   Future     Expiration Date:   06/18/2025     Stephanie Bedford, MD  INTERVAL HISTORY: she returns for surveillance follow-up for recurrent uterine cancer She is here accompanied by her son She is doing well She denies abdominal pain no changes in bowel habits We discussed test results and future follow-up  PHYSICAL EXAMINATION: ECOG PERFORMANCE STATUS: 0 - Asymptomatic  Vitals:   06/18/24 1020  BP: (!) 157/100  Pulse: 100  Resp: 18  SpO2: 98%   Filed Weights   06/18/24 1020  Weight: 163 lb 12.8 oz (74.3 kg)    Relevant data reviewed during this visit included CBC, CMP, CT imaging from August 2025

## 2024-06-18 NOTE — Assessment & Plan Note (Addendum)
 The patient was originally diagnosed with endometrial cancer in 2014, status post surgery Final pathology: Endometrioid adenocarcinoma, MMR abnormal  She had disease relapse in 2020 with presence of left paraspinal mass, biopsy showed poorly differentiated carcinoma, ER positive, consistent with GYN primary, MMR abnormal She received single agent pembrolizumab  between January 2021 to August 2022 with complete response  She is completely asymptomatic Repeat imaging study from August 2025 showed no evidence of disease I will continue to see her once a year The patient is educated to watch out for signs and symptoms of disease recurrence

## 2024-06-26 ENCOUNTER — Telehealth (HOSPITAL_BASED_OUTPATIENT_CLINIC_OR_DEPARTMENT_OTHER): Payer: Self-pay | Admitting: Family Medicine

## 2024-06-26 DIAGNOSIS — R7301 Impaired fasting glucose: Secondary | ICD-10-CM

## 2024-06-26 DIAGNOSIS — C541 Malignant neoplasm of endometrium: Secondary | ICD-10-CM

## 2024-06-26 DIAGNOSIS — N182 Chronic kidney disease, stage 2 (mild): Secondary | ICD-10-CM

## 2024-06-26 DIAGNOSIS — I10 Essential (primary) hypertension: Secondary | ICD-10-CM

## 2024-06-26 MED ORDER — COVID-19 MRNA VAC-TRIS(PFIZER) 30 MCG/0.3ML IM SUSY: 0.3000 mL | PREFILLED_SYRINGE | Freq: Once | INTRAMUSCULAR | 0 refills | Status: AC

## 2024-06-26 NOTE — Telephone Encounter (Signed)
 Copied from CRM [id number]. Topic: Clinical - Prescription Issue >> Jun 26, 2024  9:22 AM Everette C wrote: Reason for CRM: The patient has been directed by their pharmacy to contact their PCP and request a prescription for a COVID vaccination going to their pharmacy on file. Please contact the patient further when possible to verify the submission of the prescription.

## 2024-06-26 NOTE — Telephone Encounter (Signed)
 Rx for covid vaccine has been sent to pharmacy for pt. Attempted to call pt to let her know this but unable to reach. Left detailed VM letting her know.

## 2024-07-03 ENCOUNTER — Encounter: Admitting: General Practice

## 2024-07-10 ENCOUNTER — Inpatient Hospital Stay: Admitting: General Practice

## 2024-07-28 ENCOUNTER — Ambulatory Visit (HOSPITAL_BASED_OUTPATIENT_CLINIC_OR_DEPARTMENT_OTHER): Payer: Self-pay | Admitting: *Deleted

## 2024-08-17 ENCOUNTER — Other Ambulatory Visit (HOSPITAL_BASED_OUTPATIENT_CLINIC_OR_DEPARTMENT_OTHER): Payer: Self-pay | Admitting: Family Medicine

## 2024-08-17 DIAGNOSIS — F411 Generalized anxiety disorder: Secondary | ICD-10-CM

## 2024-09-02 ENCOUNTER — Ambulatory Visit (INDEPENDENT_AMBULATORY_CARE_PROVIDER_SITE_OTHER): Admitting: Family Medicine

## 2024-09-02 ENCOUNTER — Encounter (HOSPITAL_BASED_OUTPATIENT_CLINIC_OR_DEPARTMENT_OTHER): Payer: Self-pay | Admitting: Family Medicine

## 2024-09-02 VITALS — BP 155/89 | HR 113 | Ht 66.0 in | Wt 166.0 lb

## 2024-09-02 DIAGNOSIS — Z0000 Encounter for general adult medical examination without abnormal findings: Secondary | ICD-10-CM | POA: Diagnosis not present

## 2024-09-02 DIAGNOSIS — N182 Chronic kidney disease, stage 2 (mild): Secondary | ICD-10-CM

## 2024-09-02 DIAGNOSIS — Z23 Encounter for immunization: Secondary | ICD-10-CM

## 2024-09-02 DIAGNOSIS — E782 Mixed hyperlipidemia: Secondary | ICD-10-CM

## 2024-09-02 DIAGNOSIS — I10 Essential (primary) hypertension: Secondary | ICD-10-CM | POA: Diagnosis not present

## 2024-09-02 MED ORDER — ATORVASTATIN CALCIUM 20 MG PO TABS: 20.0000 mg | ORAL_TABLET | Freq: Every day | ORAL | 3 refills | Status: AC

## 2024-09-02 MED ORDER — AMLODIPINE BESYLATE 5 MG PO TABS: 5.0000 mg | ORAL_TABLET | Freq: Two times a day (BID) | ORAL | 3 refills | Status: AC

## 2024-09-02 MED ORDER — METOPROLOL TARTRATE 25 MG PO TABS: 25.0000 mg | ORAL_TABLET | Freq: Two times a day (BID) | ORAL | 3 refills | Status: AC

## 2024-09-02 MED ORDER — BENAZEPRIL HCL 20 MG PO TABS: 20.0000 mg | ORAL_TABLET | Freq: Every day | ORAL | 3 refills | Status: AC

## 2024-09-02 NOTE — Progress Notes (Signed)
 Subjective:   Stephanie [name] [birth year]  09/02/2024   CC: Chief Complaint  Patient presents with   Annual Exam    Patient is here today for her physical. Only concern stated to me was the fact that she had to come to the doctor and spouse stated that pt has severe white coat syndrome.    HPI: Stephanie [name] is a 78 y.o. female who presents for a routine health maintenance exam.  Labs collected at time of visit.    HEALTH SCREENINGS: - Vision Screening: Sees in January 2026 - Dental Visits: N/a due to dentures - Pap smear: not applicable - Breast Exam: Declined - STD Screening: Declined - Mammogram (40+): Refused  - Colonoscopy (45+): Up to date  - Bone Density (65+ or under 65 with predisposing conditions): Refused  - Lung CA screening with low-dose CT:  Not applicable Adults age 49-80 who are current cigarette smokers or quit within the last 15 years. Must have 20 pack year history.   Depression and Anxiety Screen done today and results listed below:     09/02/2024    2:33 PM 03/10/2024    2:42 PM 03/02/2024    2:09 PM 09/03/2023    2:04 PM 02/26/2023   11:13 AM  Depression screen PHQ 2/9  Decreased Interest 0 0 0 0 0  Down, Depressed, Hopeless 0 0 0 1 0  PHQ - 2 Score 0 0 0 1 0  Altered sleeping 0  0 0   Tired, decreased energy 0 0 0 0   Change in appetite 0 0 0 0   Feeling bad or failure about yourself  0 0 0 1   Trouble concentrating 0 0 0 0   Moving slowly or fidgety/restless 0 0 0 0   Suicidal thoughts 0 0 0 0   PHQ-9 Score 0  0  2    Difficult doing work/chores Not difficult at all Not difficult at all Not difficult at all Somewhat difficult      Data saved with a previous flowsheet row definition      09/02/2024    2:33 PM 03/02/2024    2:09 PM 09/03/2023    2:04 PM 02/06/2023    4:28 PM  GAD 7 : Generalized Anxiety Score  Nervous, Anxious, on Edge 0 0 1 0  Control/stop worrying 1 3 1  0  Worry too much - different things 1 3 1 1   Trouble  relaxing 0 0 0 1  Restless 0 0 0 0  Easily annoyed or irritable 0 0 1 0  Afraid - awful might happen 1 3 1  0  Total GAD 7 Score 3 9 5 2   Anxiety Difficulty Somewhat difficult Somewhat difficult Somewhat difficult Not difficult at all    IMMUNIZATIONS: - Tdap: Tetanus vaccination status reviewed: last tetanus booster within 10 years. - HPV: Not applicable - Influenza: Up to date - Pneumovax: Up to date - Prevnar 20: Administered today - Shingrix (50+): Recommended to obtain at pharmacy    Past medical history, surgical history, medications, allergies, family history and social history reviewed with patient today and changes made to appropriate areas of the chart.   Past Medical History:  Diagnosis Date   Allergy    Anemia    Anxiety    Blindness, legal    Cataract    Clotting disorder    Complication of anesthesia    Difficulty sleeping    Endometrial cancer (HCC) 2021   Family history  of liver cancer    Family history of lung cancer    Fibroids    Genetic testing 11/24/2019   Negative genetic testing. No pathogenic variants identified on the [facility] CancerNext Panel. The report date is 11/20/2019.     The CancerNext+RNAinsight gene panel offered by [facility] includes sequencing and rearrangement analysis for the following 36 genes: APC*, ATM*, AXIN2, BARD1, BMPR1A, BRCA1*, BRCA2*, BRIP1*, CDH1*, CDK4, CDKN2A, CHEK2*, DICER1, MLH1*, MSH2*, MSH3, MSH6*, MUTYH*, NBN, NF1   Glaucoma    Goals of care, counseling/discussion 10/29/2019   History of transfusion    Hypertension    Hypokalemia 11/10/2020   Other constipation 10/29/2019   PNA (pneumonia)    PONV (postoperative nausea and vomiting)    Preventive measure 06/28/2020   Severe uncontrolled hypertension 05/15/2013   Tachycardia 02/01/2015   Uncontrolled hypertension 02/01/2015   Vaginal bleeding     Past Surgical History:  Procedure Laterality Date   ABDOMINAL HYSTERECTOMY N/A 07/21/2013   Procedure: TOTAL  HYSTERECTOMY ABDOMINAL ;  Surgeon: Toribio LITTIE Percy, MD;  Location: [facility];  Service: Gynecology;  Laterality: N/A;   DILATION AND CURETTAGE OF UTERUS     EYE SURGERY     LAPAROTOMY N/A 07/21/2013   Procedure: EXPLORATORY LAPAROTOMY/ PELVIC LYMPHADENECTOMY   ;  Surgeon: Toribio LITTIE Percy, MD;  Location: [facility];  Service: Gynecology;  Laterality: N/A;   SALPINGOOPHORECTOMY Bilateral 07/21/2013   Procedure: BILATERAL SALPINGO OOPHORECTOMY;  Surgeon: Toribio LITTIE Percy, MD;  Location: [facility];  Service: Gynecology;  Laterality: Bilateral;    Current Outpatient Medications on File Prior to Visit  Medication Sig   ALPRAZolam  (XANAX ) 0.5 MG tablet TAKE 1 TABLET BY MOUTH AT BEDTIME AS NEEDED FOR ANXIETY   brimonidine  (ALPHAGAN ) 0.2 % ophthalmic solution    dorzolamide-timolol  (COSOPT) 22.3-6.8 MG/ML ophthalmic solution Place 1 drop into the left eye 2 (two) times daily.   ROCKLATAN 0.02-0.005 % SOLN Apply to eye.   No current facility-administered medications on file prior to visit.    Allergies  Allergen Reactions   Iron  Anaphylaxis    IV Iron    Iron  Dextran Anaphylaxis   Penicillins Hives and Swelling    PT, STATED ALLERGIES TO ALL  CILLINS Tongue swelling   Amoxicillin Other (See Comments)    Unknown reaction   Ampicillin Other (See Comments)    Unknown reaction   Codeine Nausea And Vomiting   Diamox [Acetazolamide] Nausea And Vomiting, Diarrhea and Nausea Only    Other reaction(s): Cramps (ALLERGY/intolerance) Severe nausea and vomitting   Penicillin G Swelling and Hives   Tramadol     Other Nausea Only     Social History   Socioeconomic History   Marital status: Divorced    Spouse name: Not on file   Number of children: 1   Years of education: Not on file   Highest education level: Master's degree (e.g., MA, MS, MEng, MEd, MSW, MBA)  Occupational History   Not on file  Tobacco Use   Smoking status: Never    Passive exposure: Never   Smokeless  tobacco: Never  Substance and Sexual Activity   Alcohol use: No   Drug use: No   Sexual activity: Not Currently    Birth control/protection: None  Other Topics Concern   Not on file  Social History Narrative   02/26/23-patient has completed her cancer treatments and is no in remission. She only has to go to the cancer center once yearly   Social Drivers of Sungard  Resource Strain: Low Risk  (09/01/2024)   Overall Financial Resource Strain (CARDIA)    Difficulty of Paying Living Expenses: Not very hard  Food Insecurity: No Food Insecurity (09/01/2024)   Hunger Vital Sign    Worried About Running Out of Food in the Last Year: Never true    Ran Out of Food in the Last Year: Never true  Transportation Needs: No Transportation Needs (09/01/2024)   PRAPARE - Administrator, Civil Service (Medical): No    Lack of Transportation (Non-Medical): No  Physical Activity: Inactive (09/01/2024)   Exercise Vital Sign    Days of Exercise per Week: 0 days    Minutes of Exercise per Session: Not on file  Stress: No Stress Concern Present (09/01/2024)   [employer] - Occupational Stress Questionnaire    Feeling of Stress: Only a little  Social Connections: Moderately Isolated (09/01/2024)   Social Connection and Isolation Panel    Frequency of Communication with Friends and Family: More than three times a week    Frequency of Social Gatherings with Friends and Family: Once a week    Attends Religious Services: More than 4 times per year    Active Member of [employer] or Organizations: No    Attends Engineer, Structural: Not on file    Marital Status: Divorced  Intimate Partner Violence: Not At Risk (03/10/2024)   Humiliation, Afraid, Rape, and Kick questionnaire    Fear of Current or Ex-Partner: No    Emotionally Abused: No    Physically Abused: No    Sexually Abused: No   Social History   Tobacco Use  Smoking Status Never   Passive  exposure: Never  Smokeless Tobacco Never   Social History   Substance and Sexual Activity  Alcohol Use No    Family History  Problem Relation Age of Onset   Liver cancer Mother 8   Cancer Mother    Hypertension Father    Arthritis Father    Vision loss Maternal Grandmother    Lung cancer Paternal Uncle      ROS: Denies fever, fatigue, unexplained weight loss/gain, chest pain, SHOB, and palpitations. Denies neurological deficits, gastrointestinal or genitourinary complaints, and skin changes.   Objective:   Today's Vitals   09/02/24 1428  BP: (!) 155/89  Pulse: (!) 113  SpO2: 100%  Weight: 166 lb (75.3 kg)  Height: 5' 6 (1.676 m)    GENERAL APPEARANCE: Well-appearing, in NAD. Well nourished.  SKIN: Pink, warm and dry. Turgor normal. No rash, lesion, ulceration, or ecchymoses. Hair evenly distributed.  HEENT: HEAD: Normocephalic.  EYES: PERRLA. EOMI. Lids intact w/o defect. Sclera white, Conjunctiva pink w/o exudate.  EARS: External ear w/o redness, swelling, masses or lesions. EAC clear. TM's intact, translucent w/o bulging, appropriate landmarks visualized. Appropriate acuity to conversational tones.  NOSE: Septum midline w/o deformity. Nares patent, mucosa pink and non-inflamed w/o drainage. No sinus tenderness.  THROAT: Uvula midline. Oropharynx clear. Tonsils non-inflamed w/o exudate. Oral mucosa pink and moist.  NECK: Supple, Trachea midline. Full ROM w/o pain or tenderness. No lymphadenopathy. Thyroid  non-tender w/o enlargement or palpable masses.  RESPIRATORY: Chest wall symmetrical w/o masses. Respirations even and non-labored. Breath sounds clear to auscultation bilaterally. No wheezes, rales, rhonchi, or crackles. CARDIAC: S1, S2 present, regular rate and rhythm. No gallops, murmurs, rubs, or clicks. PMI w/o lifts, heaves, or thrills. No carotid bruits. Capillary refill <2 seconds. Peripheral pulses 2+ bilaterally. GI: Abdomen soft w/o distention. Normoactive  bowel sounds. No palpable masses or tenderness. No guarding or rebound tenderness. Liver and spleen w/o tenderness or enlargement. No CVA tenderness.  MSK: Muscle tone and strength appropriate for age, w/o atrophy or abnormal movement.  EXTREMITIES: Active ROM intact, w/o tenderness, crepitus, or contracture. No obvious joint deformities or effusions. No clubbing, edema, or cyanosis.  NEUROLOGIC: CN's II-XII intact. Motor strength symmetrical with no obvious weakness. No sensory deficits. DTR's 2+ symmetric bilaterally. Steady, even gait.  PSYCH/MENTAL STATUS: Alert, oriented x 3. Cooperative, appropriate mood and affect.    Results for orders placed or performed in visit on 06/08/24  CMP (Cancer Center only)   Collection Time: 06/08/24 10:19 AM  Result Value Ref Range   Sodium 142 135 - 145 mmol/L   Potassium 3.4 (L) 3.5 - 5.1 mmol/L   Chloride 109 98 - 111 mmol/L   CO2 25 22 - 32 mmol/L   Glucose, Bld 95 70 - 99 mg/dL   BUN 19 8 - 23 mg/dL   Creatinine 9.10 9.55 - 1.00 mg/dL   Calcium  9.2 8.9 - 10.3 mg/dL   Total Protein 7.7 6.5 - 8.1 g/dL   Albumin 4.6 3.5 - 5.0 g/dL   AST 14 (L) 15 - 41 U/L   ALT 10 0 - 44 U/L   Alkaline Phosphatase 92 38 - 126 U/L   Total Bilirubin 0.6 0.0 - 1.2 mg/dL   GFR, Estimated >39 >39 mL/min   Anion gap 8 5 - 15  CBC with Differential (Cancer Center Only)   Collection Time: 06/08/24 10:19 AM  Result Value Ref Range   WBC Count 7.0 4.0 - 10.5 K/uL   RBC 4.67 3.87 - 5.11 MIL/uL   Hemoglobin 14.7 12.0 - 15.0 g/dL   HCT 58.8 63.9 - 53.9 %   MCV 88.0 80.0 - 100.0 fL   MCH 31.5 26.0 - 34.0 pg   MCHC 35.8 30.0 - 36.0 g/dL   RDW 86.3 88.4 - 84.4 %   Platelet Count 155 150 - 400 K/uL   nRBC 0.0 0.0 - 0.2 %   Neutrophils Relative % 60 %   Neutro Abs 4.2 1.7 - 7.7 K/uL   Lymphocytes Relative 30 %   Lymphs Abs 2.1 0.7 - 4.0 K/uL   Monocytes Relative 7 %   Monocytes Absolute 0.5 0.1 - 1.0 K/uL   Eosinophils Relative 2 %   Eosinophils Absolute 0.1 0.0 -  0.5 K/uL   Basophils Relative 1 %   Basophils Absolute 0.1 0.0 - 0.1 K/uL   Immature Granulocytes 0 %   Abs Immature Granulocytes 0.02 0.00 - 0.07 K/uL    Assessment & Plan:   1. Annual physical exam (Primary) Discussed preventative screenings, vaccines, and healthy lifestyle with patient.  Will obtain lab work today.  CBC and CMP completed with oncology in September reviewed by PCP.  Will repeat BMP given mild hypokalemia  - Basic metabolic panel with GFR - TSH - Lipid panel  2. Immunization due VIS and vaccine counseling provided for Prevnar 20.  Discussed recommendation of Shingrix vaccine in the pharmacy. - Pneumococcal conjugate vaccine 20-valent (Prevnar 20)  3. Mixed hyperlipidemia Will obtain lipid panel with lab work today.  Continue current statin therapy.  No signs of myalgias.  4. CKD (chronic kidney disease) stage 2, GFR 60-89 ml/min Previously stable.  Will obtain with BMP today and continue to monitor.  5. Essential (primary) hypertension Patient has history of whitecoat hypertension and medical practices.  She provides blood pressure readings  at home that are within normal range.  Orders Placed This Encounter  Procedures   Pneumococcal conjugate vaccine 20-valent (Prevnar 20)   Basic metabolic panel with GFR   TSH   Lipid panel    PATIENT COUNSELING:  - Encouraged a healthy well-balanced diet. Patient may adjust caloric intake to maintain or achieve ideal body weight. May reduce intake of dietary saturated fat and total fat and have adequate dietary potassium and calcium  preferably from fresh fruits, vegetables, and low-fat dairy products.   - Advised to avoid cigarette smoking. - Discussed with the patient that most people either abstain from alcohol or drink within safe limits (<=14/week and <=4 drinks/occasion for males, <=7/weeks and <= 3 drinks/occasion for females) and that the risk for alcohol disorders and other health effects rises proportionally with  the number of drinks per week and how often a drinker exceeds daily limits. - Discussed cessation/primary prevention of drug use and availability of treatment for abuse.  - Discussed sexually transmitted diseases, avoidance of unintended pregnancy and contraceptive alternatives.  - Stressed the importance of regular exercise - Injury prevention: Discussed safety belts, safety helmets, smoke detector, smoking near bedding or upholstery.  - Dental health: Discussed importance of regular tooth brushing, flossing, and dental visits.   NEXT PREVENTATIVE PHYSICAL DUE IN 1 YEAR.  Return in about 1 year (around 09/02/2025) for ANNUAL PHYSICAL.  Patient to reach out to office if new, worrisome, or unresolved symptoms arise or if no improvement in patient's condition. Patient verbalized understanding and is agreeable to treatment plan. All questions answered to patient's satisfaction.    Thersia Schuyler Stark, [place]

## 2024-09-03 ENCOUNTER — Ambulatory Visit (HOSPITAL_BASED_OUTPATIENT_CLINIC_OR_DEPARTMENT_OTHER): Payer: Self-pay | Admitting: Family Medicine

## 2024-09-03 ENCOUNTER — Telehealth (HOSPITAL_BASED_OUTPATIENT_CLINIC_OR_DEPARTMENT_OTHER): Payer: Self-pay | Admitting: Family Medicine

## 2024-09-03 LAB — BASIC METABOLIC PANEL WITH GFR
BUN/Creatinine Ratio: 22 (ref 12–28)
BUN: 17 mg/dL (ref 8–27)
CO2: 20 mmol/L (ref 20–29)
Calcium: 9.8 mg/dL (ref 8.7–10.3)
Chloride: 105 mmol/L (ref 96–106)
Creatinine, Ser: 0.78 mg/dL (ref 0.57–1.00)
Glucose: 90 mg/dL (ref 70–99)
Potassium: 4 mmol/L (ref 3.5–5.2)
Sodium: 142 mmol/L (ref 134–144)
eGFR: 79 mL/min/1.73 (ref >59)

## 2024-09-03 LAB — LIPID PANEL
Chol/HDL Ratio: 2.2 ratio (ref 0.0–4.4)
Cholesterol, Total: 174 mg/dL (ref 100–199)
HDL: 79 mg/dL (ref >39)
LDL Chol Calc (NIH): 78 mg/dL (ref 0–99)
Triglycerides: 94 mg/dL (ref 0–149)
VLDL Cholesterol Cal: 17 mg/dL (ref 5–40)

## 2024-09-03 LAB — TSH: TSH: 1.59 u[IU]/mL (ref 0.450–4.500)

## 2024-09-03 NOTE — Telephone Encounter (Signed)
 Copied from CRM [id number]. Topic: Clinical - Lab/Test Results >> Sep 03, 2024  1:58 PM Selinda RAMAN wrote: Reason for CRM: The patient called in stating she received my chart alerts but since she is legally blind she called in for the lab results. I relayee what her provider stated and she was so very appreciative. She says she is so thankful and blessed to have [place] as her provider!

## 2024-09-03 NOTE — Progress Notes (Signed)
 Hi Laryah, Your kidney function and electrolytes and blood sugar are stable.  Your thyroid  function is within normal limits.  Your cholesterol is currently well-controlled.  Keep up the good work

## 2024-09-20 ENCOUNTER — Encounter (HOSPITAL_BASED_OUTPATIENT_CLINIC_OR_DEPARTMENT_OTHER): Payer: Self-pay | Admitting: Family Medicine

## 2024-09-22 ENCOUNTER — Other Ambulatory Visit (HOSPITAL_BASED_OUTPATIENT_CLINIC_OR_DEPARTMENT_OTHER): Payer: Self-pay | Admitting: Family Medicine

## 2024-09-22 DIAGNOSIS — F411 Generalized anxiety disorder: Secondary | ICD-10-CM

## 2024-09-29 ENCOUNTER — Other Ambulatory Visit: Payer: Self-pay

## 2024-09-29 ENCOUNTER — Emergency Department (HOSPITAL_COMMUNITY)
Admission: EM | Admit: 2024-09-29 | Discharge: 2024-09-29 | Disposition: A | Discharge status: Home / Self Care | Attending: Emergency Medicine | Admitting: Emergency Medicine

## 2024-09-29 DIAGNOSIS — I10 Essential (primary) hypertension: Secondary | ICD-10-CM | POA: Diagnosis not present

## 2024-09-29 DIAGNOSIS — F411 Generalized anxiety disorder: Secondary | ICD-10-CM | POA: Diagnosis present

## 2024-09-29 DIAGNOSIS — Z79899 Other long term (current) drug therapy: Secondary | ICD-10-CM | POA: Diagnosis not present

## 2024-09-29 DIAGNOSIS — F419 Anxiety disorder, unspecified: Secondary | ICD-10-CM

## 2024-09-29 MED ORDER — LORAZEPAM 1 MG PO TABS
1.0000 mg | ORAL_TABLET | Freq: Once | ORAL | Status: AC
  Administered 2024-09-29: 23:00:00 1 mg via ORAL
  Filled 2024-09-29: qty 1

## 2024-09-29 NOTE — ED Triage Notes (Signed)
 Pt legally blind.   PT arrives via EMS from home. PT called EMS due to elevated BP. EMS BP was 157/73. PT reports taking metoprolol  before coming to the hospital. PT endorses anxiety, and is out of Xanax . Denies dizziness, denies headache.

## 2024-09-29 NOTE — Discharge Instructions (Addendum)
 It was a pleasure taking part in your care.  Please follow-up outpatient with your PCP.  Please discuss with them medications that we will address your anxiety besides benzodiazepines.  Please return to the ED with new symptoms such as chest pain, shortness of breath, headache.  Please continue to monitor blood pressure at home.

## 2024-09-29 NOTE — ED Provider Notes (Signed)
 [place] EMERGENCY DEPARTMENT AT [facility] Provider Note   CSN: [id number] Arrival date & time: 09/29/24  1815     Patient presents with: Hypertension   Stephanie [name] is a 78 y.o. female with history of uncontrolled hypertension, tachycardia, legal blindness, chronic knee pain, generalized anxiety disorder.  Presents ED complaining of generalized anxiety.  Reports that over the last few years that she has had many events occur that have made her increasingly anxious.  States that her son had an aneurysm 1 year ago and he peritoneally lost his life.  Reports that she also has been confined to her home as she has become legally blind recently.  She states that over the last few days her blood pressure spiked to above 200 despite taking antihypertensive medication.  She reports he takes morning time antihypertensive meds, metoprolol  at night.  Reports compliance on these.  States today she noted that her blood pressure was above 200 systolic and called EMS.  On arrival, EMS noted patient blood pressure was 153 systolic.  The patient denies any chest pain, shortness of breath, headache to me, she is legally blind.  Denies any change to vision.  She states that she believes all of her symptoms are secondary to being anxious and unfortunately running out of her antianxiety medication yesterday.  She states that she has a prescription waiting for her at the pharmacy but was unable to get to the pharmacy today.  She reports that her goal during this visit is to obtain antianxiety medication.  She defers on wishing to receive laboratory evaluation as she reports that she recently had labs obtained at outpatient PCP.  Denies any other concerns.  Son at bedside.  Patient denies any SI, HI, AVH.   Hypertension       Prior to Admission medications  Medication Sig Start Date End Date Taking? Authorizing Provider  ALPRAZolam  (XANAX ) 0.5 MG tablet TAKE 1 TABLET BY MOUTH AT BEDTIME AS NEEDED  FOR ANXIETY 09/22/24   Caudle, [name], FNP  amLODipine  (NORVASC ) 5 MG tablet Take 1 tablet (5 mg total) by mouth 2 (two) times daily. 09/02/24   Caudle, [name], FNP  atorvastatin  (LIPITOR) 20 MG tablet Take 1 tablet (20 mg total) by mouth daily. 09/02/24   Caudle, [name], FNP  benazepril  (LOTENSIN ) 20 MG tablet Take 1 tablet (20 mg total) by mouth daily. 09/02/24   Knute [name], FNP  brimonidine  (ALPHAGAN ) 0.2 % ophthalmic solution  09/29/19   [provider]  dorzolamide-timolol  (COSOPT) 22.3-6.8 MG/ML ophthalmic solution Place 1 drop into the left eye 2 (two) times daily. 08/30/19   [provider]  metoprolol  tartrate (LOPRESSOR ) 25 MG tablet Take 1 tablet (25 mg total) by mouth 2 (two) times daily. 09/02/24   Caudle, [name], FNP  ROCKLATAN 0.02-0.005 % SOLN Apply to eye. 02/04/23   [provider]    Allergies: Iron , Iron  dextran, Penicillins, Amoxicillin, Ampicillin, Codeine, Diamox [acetazolamide], Penicillin g, Tramadol , and Other    Review of Systems  Psychiatric/Behavioral:  The patient is nervous/anxious.   All other systems reviewed and are negative.   Updated Vital Signs BP (!) 173/99 (BP Location: Right Arm)   Pulse 92   Temp 98 F (36.7 C) (Oral)   Resp 19   SpO2 98%   Physical Exam Vitals and nursing note reviewed.  Constitutional:      General: She is not in acute distress.    Appearance: She is well-developed.  HENT:  Head: Normocephalic and atraumatic.  Eyes:     Conjunctiva/sclera: Conjunctivae normal.  Cardiovascular:     Rate and Rhythm: Regular rhythm. Tachycardia present.     Heart sounds: No murmur heard. Pulmonary:     Effort: Pulmonary effort is normal. No respiratory distress.     Breath sounds: Normal breath sounds.  Abdominal:     Palpations: Abdomen is soft.     Tenderness: There is no abdominal tenderness.  Musculoskeletal:        General: No swelling.     Cervical back: Neck  supple.  Skin:    General: Skin is warm and dry.     Capillary Refill: Capillary refill takes less than 2 seconds.  Neurological:     Mental Status: She is alert and oriented to person, place, and time. Mental status is at baseline.  Psychiatric:        Mood and Affect: Mood normal.     Comments: Alert and oriented x 4.  Not responding to internal stimuli.     (all labs ordered are listed, but only abnormal results are displayed) Labs Reviewed - No data to display  EKG: None  Radiology: No results found.  Procedures   Medications Ordered in the ED  LORazepam  (ATIVAN ) tablet 1 mg (1 mg Oral Given 09/29/24 2255)    Medical Decision Making  78 year old female presents to ED complaining of anxiety.  On exam, she is tachycardic, afebrile.  Her lung sounds are clear bilaterally, no hypoxia.  Abdomen soft and compressible.  Neuroexam at baseline.  She is not responding to internal stimuli, she denies SI or HI.  She is overall nontoxic in appearance.  Patient reports that she is here due to anxiety.  States that she has anxiety medication waiting at her pharmacy but she is unable to sleep without anxiety medication.  She reports that she has a longstanding history of anxiety and does follow with PCP.  The patient was offered laboratory evaluation, further testing and imaging but she reports that she recently had laboratory evaluation at her PCP office the other day.  Reports to me that her main concern this evening is controlling anxiety.  Patient was provided with 1 mg of Ativan  and was observed for 1 hour after.  Patient did not have any adverse reaction Ativan .  She reports that her symptoms of resolved.  Her blood pressure is currently 173/99.  Chart was reviewed, the patient had a blood pressure of 155/89 with a pulse of 113 at her recent PCP visit.  Patient also reports history of whitecoat hypertension and attributes her high blood pressure here tonight to this.  At this time, the  patient reports that all her issues have been addressed.  She was once again offered laboratory evaluation but defers.  Patient was advised to follow-up outpatient with her PCP.  I encouraged the patient to discuss her seemingly chronic anxiety and perhaps discuss cognitive behavioral therapy versus other anxiety control methods.  The patient voiced understanding.  Advised patient and patient's son of return precautions and both voiced understanding.  Stable to discharge at this time.    Final diagnoses:  Anxiety    ED Discharge Orders     None          Ruthell Lonni JULIANNA DEVONNA 09/29/24 2347    Francesca Elsie CROME, MD 09/29/24 [phone]

## 2024-10-19 ENCOUNTER — Telehealth: Payer: Self-pay | Admitting: *Deleted

## 2024-10-19 ENCOUNTER — Telehealth (HOSPITAL_BASED_OUTPATIENT_CLINIC_OR_DEPARTMENT_OTHER): Payer: Self-pay | Admitting: Family Medicine

## 2024-10-19 ENCOUNTER — Other Ambulatory Visit (HOSPITAL_BASED_OUTPATIENT_CLINIC_OR_DEPARTMENT_OTHER): Payer: Self-pay | Admitting: Family Medicine

## 2024-10-19 DIAGNOSIS — F411 Generalized anxiety disorder: Secondary | ICD-10-CM

## 2024-10-19 NOTE — Telephone Encounter (Signed)
 With alexis being out of the office, routing to Dr. [place] for review.

## 2024-10-19 NOTE — Telephone Encounter (Signed)
 Spoke with Stephanie [name] who called to schedule her annual follow up with Dr. Rogelio. Pt was given an appt. For Wednesday, March 4th. At 1100. Pt agreed to date and time and thanked the office.

## 2024-10-19 NOTE — Telephone Encounter (Signed)
 Copied from CRM [id number]. Topic: Clinical - Medication Question >> Oct 19, 2024 12:48 PM Tobias CROME wrote: Reason for CRM: Patient checking on request for alprazolam . Advised routed to provider and awaiting response. Patient needing refill to be sent in for alprazolam . Needing refill to be sent in today. Patient and son inquiring if another provider could refill in the meantime.

## 2024-10-19 NOTE — Telephone Encounter (Unsigned)
 Copied from CRM [id number]. Topic: Clinical - Medication Refill >> Oct 19, 2024  4:31 PM Zebedee SAUNDERS wrote: Medication: alprazolam  ALPRAZolam  (XANAX ) 0.5 MG tablet    Has the patient contacted their pharmacy? Yes (Agent: If no, request that the patient contact the pharmacy for the refill. If patient does not wish to contact the pharmacy document the reason why and proceed with request.) (Agent: If yes, when and what did the pharmacy advise?)  This is the patient's preferred pharmacy:  [facility] [address], [place] - [address] [place] [address] Phone: [phone] Fax: [phone]  Is this the correct pharmacy for this prescription? Yes If no, delete pharmacy and type the correct one.   Has the prescription been filled recently? Yes  Is the patient out of the medication? Yes  Has the patient been seen for an appointment in the last year OR does the patient have an upcoming appointment? Yes  Can we respond through MyChart? Yes  Agent: Please be advised that Rx refills may take up to 3 business days. We ask that you follow-up with your pharmacy.

## 2024-10-20 MED ORDER — ALPRAZOLAM 0.5 MG PO TABS: 0.5000 mg | ORAL_TABLET | Freq: Every evening | ORAL | 0 refills | Status: AC | PRN

## 2024-10-20 NOTE — Telephone Encounter (Signed)
 Can we try to get pt in for a sooner appt per timeframe stated by Dr. [place]? Thank you!

## 2024-10-20 NOTE — Telephone Encounter (Unsigned)
 Copied from CRM [id number]. Topic: Clinical - Medication Question >> Oct 20, 2024 12:11 PM Alfonso HERO wrote: Reason for CRM: patient calling for the status of her refill request. I informed her to please give until the end of business day for the doctor to review and send to the pharmacy. She asked for a call once the refill has been sent. She doesn't use MyChart.

## 2024-10-20 NOTE — Telephone Encounter (Signed)
"  Patient scheduled for 1.27.26  "

## 2024-11-10 ENCOUNTER — Encounter (HOSPITAL_BASED_OUTPATIENT_CLINIC_OR_DEPARTMENT_OTHER): Payer: Self-pay

## 2024-11-10 ENCOUNTER — Ambulatory Visit (HOSPITAL_BASED_OUTPATIENT_CLINIC_OR_DEPARTMENT_OTHER): Admitting: Family Medicine

## 2024-11-23 ENCOUNTER — Ambulatory Visit (HOSPITAL_BASED_OUTPATIENT_CLINIC_OR_DEPARTMENT_OTHER): Admitting: Family Medicine

## 2024-12-16 ENCOUNTER — Inpatient Hospital Stay: Admitting: Obstetrics & Gynecology

## 2025-04-13 ENCOUNTER — Encounter (HOSPITAL_BASED_OUTPATIENT_CLINIC_OR_DEPARTMENT_OTHER)

## 2025-05-10 ENCOUNTER — Other Ambulatory Visit

## 2025-05-18 ENCOUNTER — Ambulatory Visit: Admitting: Hematology and Oncology

## 2025-06-10 ENCOUNTER — Other Ambulatory Visit

## 2025-06-18 ENCOUNTER — Ambulatory Visit: Admitting: Hematology and Oncology

## 2025-09-06 ENCOUNTER — Encounter (HOSPITAL_BASED_OUTPATIENT_CLINIC_OR_DEPARTMENT_OTHER): Admitting: Family Medicine
